# Patient Record
Sex: Male | Born: 1935 | Race: White | Hispanic: No | Marital: Married | Smoking: Former smoker
Health system: Southern US, Community
[De-identification: ages and names within clinical notes are randomized; demographics above are authoritative.]

## PROBLEM LIST (undated history)

## (undated) DIAGNOSIS — I2510 Atherosclerotic heart disease of native coronary artery without angina pectoris: Secondary | ICD-10-CM

## (undated) DIAGNOSIS — R413 Other amnesia: Secondary | ICD-10-CM

## (undated) DIAGNOSIS — E119 Type 2 diabetes mellitus without complications: Secondary | ICD-10-CM

## (undated) DIAGNOSIS — I10 Essential (primary) hypertension: Secondary | ICD-10-CM

## (undated) DIAGNOSIS — E1140 Type 2 diabetes mellitus with diabetic neuropathy, unspecified: Secondary | ICD-10-CM

## (undated) DIAGNOSIS — E785 Hyperlipidemia, unspecified: Secondary | ICD-10-CM

## (undated) DIAGNOSIS — R269 Unspecified abnormalities of gait and mobility: Secondary | ICD-10-CM

## (undated) DIAGNOSIS — E0500 Thyrotoxicosis with diffuse goiter without thyrotoxic crisis or storm: Secondary | ICD-10-CM

## (undated) HISTORY — PX: CORONARY ARTERY BYPASS GRAFT: SHX141

## (undated) HISTORY — PX: ABDOMINAL AORTIC ANEURYSM REPAIR: SUR1152

## (undated) HISTORY — DX: Unspecified abnormalities of gait and mobility: R26.9

## (undated) HISTORY — PX: OTHER SURGICAL HISTORY: SHX169

## (undated) HISTORY — DX: Type 2 diabetes mellitus without complications: E11.9

## (undated) HISTORY — DX: Other amnesia: R41.3

## (undated) HISTORY — DX: Hyperlipidemia, unspecified: E78.5

## (undated) HISTORY — DX: Essential (primary) hypertension: I10

## (undated) HISTORY — DX: Type 2 diabetes mellitus with diabetic neuropathy, unspecified: E11.40

---

## 2000-09-10 ENCOUNTER — Ambulatory Visit (HOSPITAL_COMMUNITY): Admission: RE | Admit: 2000-09-10 | Discharge: 2000-09-10 | Payer: Self-pay | Admitting: *Deleted

## 2000-09-10 ENCOUNTER — Encounter (INDEPENDENT_AMBULATORY_CARE_PROVIDER_SITE_OTHER): Payer: Self-pay | Admitting: *Deleted

## 2003-12-29 ENCOUNTER — Encounter (INDEPENDENT_AMBULATORY_CARE_PROVIDER_SITE_OTHER): Payer: Self-pay | Admitting: Specialist

## 2003-12-29 ENCOUNTER — Ambulatory Visit (HOSPITAL_COMMUNITY): Admission: RE | Admit: 2003-12-29 | Discharge: 2003-12-29 | Payer: Self-pay | Admitting: *Deleted

## 2004-12-13 ENCOUNTER — Inpatient Hospital Stay (HOSPITAL_BASED_OUTPATIENT_CLINIC_OR_DEPARTMENT_OTHER): Admission: RE | Admit: 2004-12-13 | Discharge: 2004-12-13 | Payer: Self-pay | Admitting: Interventional Cardiology

## 2004-12-26 ENCOUNTER — Inpatient Hospital Stay (HOSPITAL_COMMUNITY): Admission: RE | Admit: 2004-12-26 | Discharge: 2005-01-07 | Payer: Self-pay | Admitting: Surgery

## 2005-02-01 ENCOUNTER — Encounter (HOSPITAL_COMMUNITY): Admission: RE | Admit: 2005-02-01 | Discharge: 2005-05-02 | Payer: Self-pay | Admitting: Interventional Cardiology

## 2005-04-26 ENCOUNTER — Observation Stay (HOSPITAL_COMMUNITY): Admission: AD | Admit: 2005-04-26 | Discharge: 2005-05-03 | Payer: Self-pay | Admitting: Interventional Cardiology

## 2005-05-02 ENCOUNTER — Encounter (INDEPENDENT_AMBULATORY_CARE_PROVIDER_SITE_OTHER): Payer: Self-pay | Admitting: Specialist

## 2005-05-27 ENCOUNTER — Ambulatory Visit (HOSPITAL_BASED_OUTPATIENT_CLINIC_OR_DEPARTMENT_OTHER): Admission: RE | Admit: 2005-05-27 | Discharge: 2005-05-27 | Payer: Self-pay | Admitting: Urology

## 2005-05-27 ENCOUNTER — Ambulatory Visit (HOSPITAL_COMMUNITY): Admission: RE | Admit: 2005-05-27 | Discharge: 2005-05-27 | Payer: Self-pay | Admitting: Urology

## 2005-05-29 ENCOUNTER — Encounter (HOSPITAL_COMMUNITY): Admission: RE | Admit: 2005-05-29 | Discharge: 2005-06-08 | Payer: Self-pay | Admitting: Interventional Cardiology

## 2005-10-24 ENCOUNTER — Encounter (HOSPITAL_COMMUNITY): Admission: RE | Admit: 2005-10-24 | Discharge: 2006-01-22 | Payer: Self-pay | Admitting: Endocrinology

## 2006-04-01 ENCOUNTER — Ambulatory Visit (HOSPITAL_COMMUNITY): Admission: RE | Admit: 2006-04-01 | Discharge: 2006-04-01 | Payer: Self-pay | Admitting: Gastroenterology

## 2007-02-19 ENCOUNTER — Ambulatory Visit: Payer: Self-pay | Admitting: *Deleted

## 2007-08-27 ENCOUNTER — Ambulatory Visit: Payer: Self-pay | Admitting: *Deleted

## 2007-09-10 ENCOUNTER — Ambulatory Visit: Payer: Self-pay | Admitting: *Deleted

## 2007-09-10 ENCOUNTER — Encounter: Admission: RE | Admit: 2007-09-10 | Discharge: 2007-09-10 | Payer: Self-pay | Admitting: *Deleted

## 2007-10-23 ENCOUNTER — Ambulatory Visit: Payer: Self-pay | Admitting: *Deleted

## 2007-10-23 ENCOUNTER — Inpatient Hospital Stay (HOSPITAL_COMMUNITY): Admission: AD | Admit: 2007-10-23 | Discharge: 2007-10-24 | Payer: Self-pay | Admitting: Family Medicine

## 2007-11-26 ENCOUNTER — Encounter: Admission: RE | Admit: 2007-11-26 | Discharge: 2007-11-26 | Payer: Self-pay | Admitting: *Deleted

## 2007-11-26 ENCOUNTER — Ambulatory Visit: Payer: Self-pay | Admitting: *Deleted

## 2008-08-04 ENCOUNTER — Ambulatory Visit: Payer: Self-pay | Admitting: *Deleted

## 2009-01-19 ENCOUNTER — Ambulatory Visit: Payer: Self-pay | Admitting: *Deleted

## 2009-01-19 ENCOUNTER — Encounter: Admission: RE | Admit: 2009-01-19 | Discharge: 2009-01-19 | Payer: Self-pay | Admitting: *Deleted

## 2009-07-20 ENCOUNTER — Ambulatory Visit: Payer: Self-pay | Admitting: *Deleted

## 2010-01-22 ENCOUNTER — Encounter: Admission: RE | Admit: 2010-01-22 | Discharge: 2010-01-22 | Payer: Self-pay | Admitting: Family Medicine

## 2010-02-06 ENCOUNTER — Other Ambulatory Visit: Admission: RE | Admit: 2010-02-06 | Discharge: 2010-02-06 | Payer: Self-pay | Admitting: Interventional Radiology

## 2010-02-06 ENCOUNTER — Encounter: Admission: RE | Admit: 2010-02-06 | Discharge: 2010-02-06 | Payer: Self-pay | Admitting: Endocrinology

## 2010-06-07 ENCOUNTER — Encounter: Admission: RE | Admit: 2010-06-07 | Discharge: 2010-06-07 | Payer: Self-pay | Admitting: Gastroenterology

## 2010-08-10 ENCOUNTER — Ambulatory Visit: Payer: Self-pay | Admitting: Vascular Surgery

## 2010-10-01 ENCOUNTER — Encounter: Admission: RE | Admit: 2010-10-01 | Discharge: 2010-10-01 | Payer: Self-pay | Admitting: Nephrology

## 2010-12-30 ENCOUNTER — Encounter: Payer: Self-pay | Admitting: *Deleted

## 2011-01-24 ENCOUNTER — Other Ambulatory Visit: Payer: Self-pay | Admitting: Vascular Surgery

## 2011-01-24 DIAGNOSIS — I714 Abdominal aortic aneurysm, without rupture: Secondary | ICD-10-CM

## 2011-02-20 ENCOUNTER — Ambulatory Visit: Payer: Self-pay | Admitting: Vascular Surgery

## 2011-02-20 ENCOUNTER — Ambulatory Visit
Admission: RE | Admit: 2011-02-20 | Discharge: 2011-02-20 | Disposition: A | Discharge status: Home / Self Care | Payer: Self-pay | Source: Ambulatory Visit | Attending: Vascular Surgery | Admitting: Vascular Surgery

## 2011-02-20 DIAGNOSIS — I714 Abdominal aortic aneurysm, without rupture: Secondary | ICD-10-CM

## 2011-02-20 MED ORDER — IOHEXOL 300 MG/ML  SOLN
80.0000 mL | Freq: Once | INTRAMUSCULAR | Status: AC | PRN
  Administered 2011-02-20: 80 mL via INTRAVENOUS

## 2011-03-06 ENCOUNTER — Ambulatory Visit (INDEPENDENT_AMBULATORY_CARE_PROVIDER_SITE_OTHER): Payer: Medicare Other | Admitting: Vascular Surgery

## 2011-03-06 DIAGNOSIS — I714 Abdominal aortic aneurysm, without rupture: Secondary | ICD-10-CM

## 2011-03-07 NOTE — Assessment & Plan Note (Signed)
OFFICE VISIT  [name], [name] DOB:  1934-01-24                                       03/06/2011 [id number]  This is a pleasant 77 year old gentleman who Dr. Amedeo Plenty performed an endovascular aneurysm repair on with an Endologix graft in November 2008.  He comes in for a routine follow-up visit.  He had previously had a duplex scan here in September which showed a slight decrease in the measurement of the aneurysmal sac with a maximum measurement of 46 cm. Due to the patient's size, it was somewhat of a challenging study so he comes in now for a 82-month CT scan.  Since he was seen in our office last he has had no abdominal or back pain.  He states there has been no change in his medical history.  He has diabetes which has been stable on his current medications.  In addition he has hypertension and hypercholesterolemia both which are stable on his current medications.  SOCIAL HISTORY:  He is married.  He has 3 children.  He quit tobacco in 1991.  REVIEW OF SYSTEMS:  CARDIOVASCULAR:  He had no chest pain, chest pressure, palpitations or arrhythmias. PULMONARY:  He had no productive cough, bronchitis, asthma or wheezing.  PHYSICAL EXAMINATION:  This is a pleasant 77 year old gentleman who appears his stated age.  Blood pressure 116/67, heart rate is 54, respiratory rate is 54, respiratory rate is 16.  Lungs:  Clear bilaterally to auscultation without rales, rhonchi or wheezing. Cardiovascular exam:  I do not detect any carotid bruits.  He has a regular rate and rhythm.  He has palpable femoral pulses.  Both feet are warm and well-perfused without ischemic ulcers.  Abdomen:  Obese and difficult to assess.  He has normal pitched bowel sounds.  Neurologic: He has no focal weakness or paresthesias.  I did review his CT scan.  The radiologist interpreted the maximum diameter at 4.2 cm; however, the maximum diameter that I could obtain on my independent  interpretation is 3.9 cm.  There is no evidence of Endoleak and the graft appears to be excellent position.  The aneurysm continues to shrink gradually.  I think it is safe to continue his follow-up at 1 year and will try to alternate ultrasound with CT scan.  He did have problems with the p.o. contrast with the CT scan so in the future we can try to get this done with simply IV contrast.  I will plan on seeing him back in 1 year and I have ordered a follow-up duplex scan of his graft at that time.  He appears to have had an excellent result with his graft and the aneurysm continues to shrink.    Judeth Cornfield. Scot Dock, M.D. Electronically Signed  CSD/MEDQ  D:  03/06/2011  T:  03/07/2011  Job:  [id number]

## 2011-04-23 NOTE — Assessment & Plan Note (Signed)
OFFICE VISIT   [name], [name]  DOB:  March 07, 1934                                       01/19/2009  [id number]   The patient underwent AAA stent graft placement in November of 2009.  He  returns at this time for routine followup.  He underwent a CT scan prior  to his visit.  This reveals his native aneurysm sac to be stable in size  at 5 cm.  The stent to been in good position, no evidence of endoleak.  Iliac limbs remain patent bilaterally.   The patient has no complaints.  Denies abdominal pain or back pain.  His  appetite is good.  Regular bowel habits.  Weight is stable.   He appears generally well.  Alert, oriented.  No distress.  BP 117/67,  pulse 66, respirations 18.  Abdomen reveals obesity.  Bowel sounds  active, no bruits.  No masses or organomegaly.  Nontender.  Intact  femoral pulses bilaterally.  1+ ankle edema.   Overall the patient is doing well following AAA stent graft placement in  2008.  We will plan followup again in 6 months with an ultrasound of the  abdomen.   Dorothea Glassman, M.D.  Electronically Signed   PGH/MEDQ  D:  01/19/2009  T:  01/20/2009  Job:  [id number]   cc:   Belva Crome, M.D.  Priscille Heidelberg Little, M.D.

## 2011-04-23 NOTE — Assessment & Plan Note (Signed)
OFFICE VISIT   MAMOUN, SPEARMAN  DOB:  [birth year]                                       11/26/2007  [id number]   The patient is status post placement of an endovascular stent graft for  AAA on October 23, 2007.  He returns today with a followup CT scan.  This reveals no complicating features.  No evidence of major endoleak.  Slight enlargement of his aneurysm.   He has no complaints at this time.   BP 103/62, pulse 61, respirations 18.  Abdomen is soft and nontender.  No masses or organomegaly.  2+ femoral pulses bilaterally.  Right groin  incision is healing well.   The patient shows no complicating factors related to his AAA stent  grafting.  Will plan followup again in 6 months with a repeat CT scan.   Dorothea Glassman, M.D.  Electronically Signed   PGH/MEDQ  D:  11/26/2007  T:  11/27/2007  Job:  [id number]   cc:   Lennette Bihari L. Little, M.D.  Belva Crome, M.D.

## 2011-04-23 NOTE — Op Note (Signed)
[name], [name] NO.:  [id number]   MEDICAL RECORD NO.:  [id number]          PATIENT TYPE:  INP   LOCATION:  2550                         FACILITY:  [facility]   PHYSICIAN:  Dorothea Glassman, M.D.    DATE OF BIRTH:  [birth year]   DATE OF PROCEDURE:  10/23/2007  DATE OF DISCHARGE:                               OPERATIVE REPORT   SURGEON:  Dorothea Glassman, MD   ASSISTANT:  1. Leia Alf, MD   ANESTHETIC:  General endotracheal.   ANESTHESIOLOGIST:  Lorrene Reid, MD   PREOPERATIVE DIAGNOSIS:  Enlarging 5.5 cm infrarenal abdominal aortic  aneurysm.   POSTOP DIAGNOSIS:  Enlarging 5.5 cm infrarenal abdominal aortic  aneurysm.   PROCEDURE:  1. Endovascular abdominal aortic aneurysm repair with Endologix      Powerlink stent graft (25 x 16 x 140, 28 x 28 x 75).  2. Retrograde left femoral arteriogram.  3. Left common femoral artery closure with StarClose.   FLUOROSCOPY TIME:  Ten minutes 12 seconds.   CONTRAST:  Visipaque 65 mL.   COMPLICATIONS:  None apparent.   CLINICAL NOTE:  [name] is a 77 year old gentleman with a known  abdominal aortic aneurysm.  This was followed regularly in the office  with serial ultrasound and found to enlarge to 5.5 cm.  CT scan carried  out revealed an adequate infrarenal aortic neck for placement of an  aortic stent graft.  He does have a small accessory right inferior pole  accessory renal artery which will be sacrificed with the procedure.   OPERATIVE PROCEDURE:  The patient was brought to the operating room in  stable condition.  Placed in supine position.  General endotracheal  anesthesia induced.  Foley catheter, central venous catheter and  arterial line placed.  In the supine position the abdomen and both legs  prepped and draped in a sterile fashion.   Oblique skin incision made in the right groin.  Dissection carried down  through subcutaneous tissue with electrocautery.  Deep dissection  carried down to  expose the right common femoral artery.  This was  mobilized up to the inguinal ligament and encircled proximally with a  vessel loop.  Distal dissection carried down to the origin of the  superficial femoral artery and profunda femoris artery and the vessel  encircled with a vessel loop distally.   The patient administered 7000 units of heparin intravenously.  The right  common femoral artery accessed with an 18 gauge needle and a 0.035 J  wire advanced to the needle into the abdominal aorta under fluoroscopy.  A 12 French tearaway sheath was advanced over the guidewire into the  right common femoral artery.  The dilator removed and the sheath flushed  with heparin and saline solution.   Ultrasound of the left groin carried out and the left common femoral  artery identified.  Using ultrasound guidance the left common femoral  artery was accessed percutaneously with an 18 gauge needle.  The left  common femoral artery then accessed with an 0.35 Rosen guidewire  advanced into the abdominal aorta  under fluoroscopy.  The site opened  with 11 blade.  A 9 French sheath advanced over the guidewire in the  left common femoral artery.  The dilator removed and the sheath flushed  with heparin and saline solution.   A snare sheath was then advanced over the right femoral wire and the  wire removed.  A snare passed up into the abdominal aorta from the right  side.  The snare then used to grasp the Rosen wire and brought across  the bifurcation and out the right femoral sheath.  A dual lumen catheter  was then passed across the bifurcation and out the right femoral sheath.  The second lumen opening was positioned at 11 o'clock.  This was  accessed with an Amplatz super stiff guidewire which was advanced up  into the distal descending thoracic aorta.  The bifurcated main body  device delivery catheter was then loaded onto the Amplatz super stiff  guidewire from the right and the contralateral limb  wire was advanced  through the dual lumen catheter across the bifurcation and out the left  side.  The dual lumen catheter was removed.  The bifurcated delivery  catheter was then advanced up to the right common femoral artery.  The  tearaway sheath removed from the right common femoral artery and the  device advanced up into the abdominal aorta.  The contralateral limb  wire was delivered down through the left side.  The limbs of the graft  were then exposed by withdrawing the outer sheath.  This was then  resheathed on the right and the device brought down on the bifurcation.  The main body of the device was then deployed using the pusher rod.  The  contralateral limb was then accessed with a 0.014  guidewire passed  through the SurePass contralateral limb wire.  The contralateral limb  was then deployed in the left common iliac artery.  A marker pigtail  catheter was advanced over the contralateral guidewire to the suprarenal  position.  A minimal flush aortogram was obtained to clearly delineate  the origin of the lowermost renal artery at the left.  There was still a  considerable distance between the proximal portion of the bifurcated  device and the lowermost left renal artery.  Therefore an aortic  extension cuff was placed.  The right femoral sheath was removed and  over the guidewire was passed a 28 x 28 x 75 aortic cuff.  This was  positioned initially suprarenally.  Using contrast injections this was  then deployed down to the immediate infrarenal position below the left  renal artery.  The delivery sheath then removed from the right groin and  a 16 French sheath advanced over the guidewire.  The dilator removed.  A  Coda balloon was then advanced over the main Amplatz guidewire and the  Coda balloon inflated at the junction between the cuff and the aorta and  also at the junction between the cuff and the main body.  Coda balloon  then drawn back down into the right limb.   Guidewire advanced over the  pigtail catheter.  The pigtail catheter removed from the left groin and  a 12 x 4 Powerflex balloon advanced over the guidewire.  Using a kissing  balloon technique the Coda balloon and the 12 x 4 Powerflex were  inflated in the iliac limbs.  The balloon was then removed.  A pigtail  catheter re-advanced over the left femoral guidewire.  A completion  arteriogram  obtained.  This revealed widely patent left main renal  artery and right main renal artery.  The small inferior pole right  accessory renal artery was sacrificed.  There was no evidence of type 1  or type 3 Endoleak.  There was significant type 2 leak identified.  No  porosity leak identified (type 4).  There was excellent flow through the  iliac limbs bilaterally.  No evidence of iliac stenosis.  Hypogastric  arteries were patent bilaterally.   The balloon was removed from the left groin.  A retrograde left femoral  arteriogram obtained with an LAO projection picture.  This verified the  left femoral sheath to be in the common femoral artery.  The sheath was  removed.  A StarClose sheath advanced over the guidewire.  The guidewire  removed.  StarClose device advanced through the sheath.  The device  loaded and brought back to the arterial wall and deployed.  The device  removed and no apparent complications.   On the right side the 16 French sheath and guidewire were removed and  the vessel controlled proximally with a clamp.  Adequate antegrade and  retrograde flushing carried out.  The transverse arteriotomy closed with  running 5-0 Prolene suture.  A small amount of bleeding controlled with  a pledgeted interrupted 5-0 Prolene suture.  Clamps were then removed.  Excellent flow was present.   Adequate hemostasis obtained.  Sponge and instrument counts were  correct.  The patient was administered 50 mg of protamine intravenously.   The right groin was then closed with a deep layer of interrupted  2-0  Vicryl suture, subcutaneous layer of running 3-0 Vicryl sutures.  Skin  closed with 4-0 Monocryl and Dermabond.   There were no apparent complications during the procedure.  The patient  tolerated this well.  Transferred to the recovery room in stable  condition.  Chest x-ray and KUB ordered.      Dorothea Glassman, M.D.  Electronically Signed     PGH/MEDQ  D:  10/23/2007  T:  10/24/2007  Job:  [id number]   cc:   Lennette Bihari L. Little, M.D.  Belva Crome, M.D.

## 2011-04-23 NOTE — Discharge Summary (Signed)
NAMEDRAYSEN, LOWERY NO.:  [id number]   MEDICAL RECORD NO.:  [id number]          PATIENT TYPE:  INP   LOCATION:  [id number]                         FACILITY:  [facility]   PHYSICIAN:  Theotis Burrow IV, MDDATE OF BIRTH:  November 19, 1934   DATE OF ADMISSION:  10/23/2007  DATE OF DISCHARGE:  10/24/2007                               DISCHARGE SUMMARY   PROCEDURE PERFORMED:  Endovascular abdominal aortic aneurysm repair on  October 23, 2007.   DISCHARGE DIAGNOSES:  1. Endovascular abdominal aortic aneurysm.  2. Hypertension.  3. Hypercholesterolemia.  4. Coronary artery disease.   SUMMARY OF HOSPITAL COURSE:  This is a 77- year-old gentleman who came  to the hospital on October 23, 2007, for elective repair of his  abdominal aneurysm.  He underwent uncomplicated successful endovascular  exclusion of the aneurysm using an Endologix device.  Following the  procedure, the patient was taken to 3300.  He had a normal postoperative  course.  On the day following his procedure, he felt well.  He was able  to ambulate with minimal pain and desired to go home.  He will be sent  home with routine postoperative instructions.   Only medication is Percocet.  He will resume his home medications, which  include;  1. Lipitor 10 mg per day.  2. Metoprolol 50 mg per day.  3. Lipitor 20 mg per day.  4. Triamterine/HCTZ 37.5/25 daily.  5. Doxepin 25 daily.  6. Sertraline 50 mg per day.  7. Methimazole 2.5 mg per day.  8. Aspirin 325 mg per day.  9. Niaspan 500 mg per day.  10.Insulin 70/30, 70 units subcutaneous in the morning and insulin      70/30, 37 units subcutaneous in the afternoon.   He will follow up with Dr. Amedeo Plenty in several weeks.  He should call if he  has any problems from his wounds, fevers, chills, nausea, or vomiting.      Eldridge Abrahams, MD  Electronically Signed     VWB/MEDQ  D:  10/24/2007  T:  10/24/2007  Job:  [id number]

## 2011-04-23 NOTE — Procedures (Signed)
DUPLEX ULTRASOUND OF ABDOMINAL AORTA   INDICATION:  Follow up known abdominal aortic aneurysm.   HISTORY:  Diabetes:  Yes.  Cardiac:  Coronary artery disease.  Hypertension:  Yes.  Smoking:  Yes.  Connective Tissue Disorder:  Family History:  Previous Surgery:   DUPLEX EXAM:         AP (cm)                   TRANSVERSE (cm)  Proximal             2.23 cm                   2.6 cm  Mid                  4.7 cm                    5.29 cm  Distal               2.77 cm                   2.6 cm  Right Iliac          Not well visualized       Not well visualized  Left Iliac           Not well visualized       Not well visualized   PREVIOUS:  Date:  AP:  4.6  TRANSVERSE:  4.5   IMPRESSION:  1. Abdominal aortic aneurysm noted with the largest measurement of      (4.7 cm x 5.29 cm).  2. Slightly increase in measurement noted, compared to previous study.   ___________________________________________  P. Drucie Opitz, M.D.   MG/MEDQ  D:  08/27/2007  T:  08/27/2007  Job:  [id number]

## 2011-04-23 NOTE — Assessment & Plan Note (Signed)
OFFICE VISIT   [name], [name]  DOB:  1934-11-23                                       08/04/2008  [id number]   The patient underwent placement of a AAA stent graft 10/22/2008 at [facility].  He had no associated complications.   He is seen today for routine followup visit.  He underwent an ultrasound  at this time due to a mildly elevated creatinine.  His ultrasound  reveals his native aneurysmal sac to be 5 cm in maximal diameter.  His  stent graft is patent without evidence of endoleak.  Ankle brachial  indices greater than 1 bilaterally.   The patient appears well.  He has no complaints at this time.  Alert and  oriented.  Blood pressure is 114/69, pulse 59 per minute.  Abdomen soft,  nontender.  Intact femoral pulses bilaterally.   Overall the patient is doing well without complicating features  associated with his AAA stent.  We will plan followup again in 6 months  and with possible CT scan if creatinine is adequate at that time.   Dorothea Glassman, M.D.  Electronically Signed   PGH/MEDQ  D:  08/04/2008  T:  08/05/2008  Job:  [id number]   cc:   Belva Crome, M.D.  Priscille Heidelberg Little, M.D.

## 2011-04-23 NOTE — Assessment & Plan Note (Signed)
OFFICE VISIT   [name], [name]  DOB:  January 17, 1934                                       08/27/2007  [id number]   Patient returned for routine surveillance ultrasound of his known AAA.  This reveals fairly significant enlargement, now measuring 5.2 cm in  maximal diameter.  Previous measurement was 4.6 cm in maximal diameter.  This is a significant enlargement over six months.   Patient has been free of any symptoms.  He denies abdominal pain or back  pain.  No chest pain or shortness of breath.   His blood pressure is 112/67.  Pulse is 60 per minute.  He has an obese  abdomen.  No palpable AAA.  No tenderness or organomegaly.  He has 2+  femoral pulses bilaterally.  No ankle edema.   I have recommended we go ahead with a CT angiogram of the abdomen with  runoff.  Noting significant enlargement of his aneurysm, I think we  should pursue potential repair.   Dorothea Glassman, M.D.  Electronically Signed   PGH/MEDQ  D:  08/27/2007  T:  08/28/2007  Job:  [id number]   cc:   Lennette Bihari L. Little, M.D.  Belva Crome, M.D.

## 2011-04-23 NOTE — Procedures (Signed)
ENDOVASCULAR STENT GRAFT EXAM   INDICATION:  Follow up abdominal aortic aneurysm repair.   HISTORY:  Abdominal aortic aneurysm Endograft repair, 10/23/07, by Dr. Amedeo Plenty.                           DUPLEX EVALUATION   AAA Sac Size:                 4.52 CM AP            5.07 CM TRV  Previous Sac Size:            11/26/07 (CT) 5.2 CM AP  11/26/07 (CT) 5.0 CM TRV  Evidence of an endoleak?      No                    No   Velocity Criteria:  Proximal Aorta                118 cm/sec  Proximal Stent Graft          98 cm/sec  Main Body Stent Graft-Mid     83 cm/sec  Right Limb-Proximal           78 cm/sec  Right Limb-Distal             88 cm/sec  Left Limb-Proximal            103 cm/sec  Left Limb-Distal              100 cm/sec  Patent Renal Arteries?        Yes                   Yes   IMPRESSION:  1. Patent abdominal aortic aneurysm Endograft without evidence of      endoleak.  2. Note:  A technically limited study due to positioning, body      habitus, and vessel depth.  Was only able to visualize in the      lateral decubitus positions.   ___________________________________________  P. Drucie Opitz, M.D.   AS/MEDQ  D:  08/04/2008  T:  08/04/2008  Job:  [id number]

## 2011-04-23 NOTE — Procedures (Signed)
VASCULAR LAB EXAM   INDICATION:  Follow up AAA endograft placed, 10/23/2007.   HISTORY:  Diabetes:  Yes.  Cardiac:  Yes.  Hypertension:  Yes.   EXAM:  AAA sac size 3.88 cm AP, 4.59 cm TRV.   Previous sac size on 07/20/2009 4.89 cm AP, 4.96 cm TRV.   IMPRESSION:  1. The aorta and endograft appear patent where visualized.  2. Slight decrease in measurement of the aneurysmal sac surrounding      the endograft; however, limited visualization.  3. No evidence of endoleak was detected; however, limited      visualization.  4. Technically limited study due to positioning, body habitus, and      vessel depth.  5. Only able to visualize in lateral decubitus position.   ___________________________________________  Judeth Cornfield. Scot Dock, M.D.   EM/MEDQ  D:  08/10/2010  T:  08/10/2010  Job:  [id number]

## 2011-04-23 NOTE — Assessment & Plan Note (Signed)
OFFICE VISIT   [name], [name]  DOB:  1934-05-27                                       09/10/2007  [id number]   The patient returned to the office today with the results of his CT  scan.  This does verify a 5.2 cm abdominal aortic aneurysm.  This has  grown significantly from previous measurement of 4.6.  He does have a  long infrarenal aortic neck and adequate iliac arteries for placement of  an aortic stent graft.   The patient will require a preoperative Cardiolite evaluation prior to  AAA stent placement.  We will arrange this with Dr. Thompson Caul office.  Once this has been completed, his procedure will be scheduled.   Dorothea Glassman, M.D.  Electronically Signed   PGH/MEDQ  D:  09/10/2007  T:  09/11/2007  Job:  [id number]   cc:   Lennette Bihari L. Little, M.D.  Belva Crome, M.D.

## 2011-04-23 NOTE — Procedures (Signed)
ENDOVASCULAR STENT GRAFT EXAM   INDICATION:  Follow-up evaluation of abdominal aortic aneurysm repair.   HISTORY:  Abdominal aortic Endograft repair on 10/23/2007 by Dr. Amedeo Plenty.                           DUPLEX EVALUATION   AAA Sac Size:                 4.89 CM AP            4.96 CM TRV  Previous Sac Size:            4.528 CM AP           5.07 CM TRV  Evidence of an endoleak?      No                    No   Velocity Criteria:  Proximal Aorta                121 cm/sec  Proximal Stent Graft          98 cm/sec  Main Body Stent Graft-Mid     128 cm/sec  Right Limb-Proximal           137 cm/sec  Right Limb-Distal             123 cm/sec  Left Limb-Proximal            110 cm/sec  Left Limb-Distal              97 cm/sec  Patent Renal Arteries?        Yes                   Yes   IMPRESSION:  1. Patent abdominal aortic Endograft with no evidence of endoleak or      stenosis.  2. Normal bilateral lower extremity ABIs with biphasic and triphasic      Doppler waveforms.   ___________________________________________  P. Drucie Opitz, M.D.   AC/MEDQ  D:  07/20/2009  T:  07/20/2009  Job:  [id number]

## 2011-04-26 NOTE — Discharge Summary (Signed)
[name], [name] NO.:  [id number]   MEDICAL RECORD NO.:  [id number]          PATIENT TYPE:  INP   LOCATION:  2024                         FACILITY:  [facility]   PHYSICIAN:  Belva Crome, M.D.   DATE OF BIRTH:  May 12, 1934   DATE OF ADMISSION:  04/26/2005  DATE OF DISCHARGE:  05/03/2005                                 DISCHARGE SUMMARY   ADMISSION DIAGNOSES:  1.  Anemia secondary to supratherapeutic INR.  2.  Hypertension.  3.  Coronary artery disease.  4.  Hematuria.  5.  Paroxysmal atrial fibrillation.   DISCHARGE DIAGNOSES:  1.  Anemia secondary to supratherapeutic INR, resolved.  2.  Gross hematuria, improved.  3.  Mild renal insufficiency, improved.  4.  Hypokalemia, resolved.  5.  Coronary artery disease, stable.  6.  Hypertension, controlled.  7.  Paroxysmal atrial fibrillation, maintained in sinus rhythm.   PROCEDURE:  None.   DISCHARGE STATUS:  Stable.   HISTORY OF PRESENT ILLNESS:  Please see complete H&P for details, but in  short this is a 77 year old male with known CAD, status post CABG from 2004.  Also with history of PAF and is on chronic anticoagulation.  He apparently  miss dosed himself for three days taking a higher dose thinking he was  taking lower dose tablets. On admission his INR was a little over 6 and his  hemoglobin was down to 8.7. He had complaints of hematuria. He was admitted  for further treatment.   PHYSICAL EXAMINATION ON ADMISSION:  Please see complete H&P, but in short  his vital signs were stable. Physical exam essentially without abnormalities  except for some mild lower extremity ankle edema. EKG was maintained in  normal sinus rhythm with normal QT interval and as stated hemoglobin 8.7 on  admission with INR of 6.28. The rest of his labs reveal some azotemia with a  BUN and creatinine of 38 and 2.2.   HOSPITAL COURSE:  She was admitted to telemetry. Coumadin was on hold. He  was given IV vitamin K for INR  reversal. Outpatient medications were  continued.  Will monitor CBC.   The following day, on Apr 27, 2005, INR down to 3.3. Hemoglobin down to 8.  He was given two units of packed red blood cells. Coumadin and aspirin  continued to be on hold. Maxzide was also held at this point with his blood  pressure being lower at 109/60.   On Apr 28, 2005, he was complaining of left lower quadrant pain. Blood  pressure had improved and increasing back up to [id number] to 0000000 systolically.  He maintained a sinus rhythm. No new changes on physical exam. Hemoglobin  went up to 10.5.  INR down to 2.2. BUN and creatinine 39 and 2.6.  Urinalysis was sent. Urology was consulted for worsening renal function and  persistent hematuria, now with left lower quadrant pain. Diuretics were on  hold. Coumadin continued to be held.   On Apr 28, 2005, Dr. Karsten Ro saw the patient in consult. He agreed with  urine for CNS holding Coumadin.  He started him on empiric Levaquin. He  wanted to check a renal ultrasound and a PSA. PSA was checked and was within  normal limits. Renal ultrasound was performed on Apr 29, 2005, which showed  no evidence of obstruction or specific abnormality.  Urine culture was  negative. Cystogram was planned by Dr. Karsten Ro. The rest of this  hospitalization was essentially uneventful. INR had normalized and actually  became subtherapeutic by Apr 30, 2005, down to 1.8.  BUN and creatinine that  same day 42 and 2.4. Hemoglobin was down a little more to 9.5.  No other  significant changes were noted.  Potassium did drop to 3.2 on May 01, 2005.  Replacement was ensued.   On May 02, 2005, the patient underwent cystogram with Dr. Karsten Ro. He  tolerated well and there were no complications. Specimen was sent for  cytology of the right renal pelvis. By May 03, 2005, the patient was ready  for discharge. BUN and creatinine down to 21 and 2.0 respectively.  Hemoglobin remaining stable at 9.6. INR  subtherapeutic at 1.3. His Coumadin  would be discontinued at this point. Further treatment and evaluation per  Dr. Karsten Ro pending cytoscopy pathology report. He was maintaining a sinus  rhythm and had no other problems or events during hospitalization. He was  discharged home without incident.   DISCHARGE MEDICATIONS:  1.  Aspirin 325 mg daily.  2.  Niacin 2000 mg daily.  3.  Lipitor 20 mg daily.  4.  Toprol XL 50 mg daily.  5.  Doxepin 25 mg at h.s.  6.  Amaryl 2 mg daily.  7.  Avandia 4 mg b.i.d.  8.  Claritin p.r.n.  9.  Iron 325 mg b.i.d.   He was instructed not to take his Coumadin, triamterene, HCTZ, or his  amiodarone.   ACTIVITY:  As tolerated.   DIET:  He is to maintain low-salt, low-fat, low-cholesterol diet as well as  maintain his diabetic diet restrictions.   He is to have bloodwork at Dr. Thompson Caul office on Tuesday, May 07, 2005. He  is to call Dr. Simone Curia office for follow-up appointment one week after  discharge. He has an appointment to see Dr. Tamala Julian for follow-up on Monday,  May 20, 2005, at 3 p.m.      Verlon Au, N.P.      Belva Crome, M.D.  Electronically Signed    HB/MEDQ  D:  07/21/2005  T:  07/21/2005  Job:  [id number]   cc:   Thana Farr. Karsten Ro, M.D.  [place]. [address] [place]  [place] [address]  Fax: [phone]

## 2011-04-26 NOTE — Consult Note (Signed)
NAMEJADDEN, LAHAYE NO.:  [id number]   MEDICAL RECORD NO.:  [id number]          PATIENT TYPE:  INP   LOCATION:  2024                         FACILITY:  [facility]   PHYSICIAN:  Mark C. Karsten Ro, M.D.  DATE OF BIRTH:  [birth year]   DATE OF CONSULTATION:  04/28/2005  DATE OF DISCHARGE:                                   CONSULTATION   Mr. Shirkey is a very pleasant 77 year old white male seen in hospital  consultation for further evaluation of gross hematuria.  He was admitted two  days ago with anemia and hematuria due to overanticoagulation.  He has PAF  and has been on Coumadin, he took too much of the medication by mistake.  He  had an INR of 14.5 and began having hematuria.  His hemoglobin was checked,  it was 9.4.  He was given Vitamin K and his hemoglobin further decreased.  He was, therefore, transfused and I was contacted regarding further  evaluation.  In addition to his hematuria, there was reported left lower  quadrant pain last night and he has also had an elevated creatinine this  hospitalization.   In talking with the patient, his gross hematuria started on Thursday.  He  has had no change in his voiding pattern and denies any dysuria or new  obstructive or irritative voiding symptoms.  As a baseline, he has nocturia  1-2 times but has a good stream.  Last night he developed left lower  quadrant pain, he described it as constant with no radiation into the  testicles or flank, and of moderate severity.  It was not associated with  any nausea, vomiting.  The pain has now subsided and completely resolved.   He has no past history of stones, UTI or hematuria.  Dr. Hulan Fess  follows his PSA and he reports this has been normal previously.  He does  have a past history of an elevated creatinine his last admission for his  bypass surgery.  His creatinine was 1.6 on the day of that admission, when  he was admitted for his surgery it got as low as 1.3 but was as  high as 2.1  on the 22nd of January.  When he was discharged from the hospital on the  29th of January, it was 1.9.  His creatinine upon this admission was 2.2, 48  hours later it has increased to 2.6.  He has no history of renal disease  prior.   PAST MEDICAL HISTORY:  Is positive for:  1.  PAT.  2.  Coronary artery disease.  3.  Adult onset diabetes.  4.  Hypertension.  5.  Obesity.   SURGICAL HISTORY:  He has had coronary artery bypass grafting.   ALLERGIES:  NKDA.   MEDICATIONS ON ADMISSION:  Amaryl, Avandia, metoprolol, aspirin, amiodarone,  Lipitor, Niaspan, Doxepin, triamterene/HCTZ, and Coumadin.   FAMILY HISTORY:  Is negative for GU malignancy, renal disease or stones.  Mother died in her 24s of cardiovascular disease and an MI, father in his  43s and he had an MI, COPD and was  a smoker.   SOCIAL HISTORY:  He used to smoke but quit in the 80s.  Denies ethanol use.   REVIEW OF SYSTEMS:  Is negative for shortness of breath.  He has no flank  pain.  He has some swelling in both lower extremities since his last  surgery.  His full review of systems is otherwise negative at this time.   PHYSICAL EXAM:  Temperature 97.2, pulse 69, blood pressure is 124/58,  respirations 20 and unlabored.  Generally, the patient is a well-developed,  well-nourished, moderately obese white male in no apparent distress.  HEENT:  Atraumatic, normocephalic.  Oropharynx is clear.  NECK:  Supple with midline trachea.  CHEST:  Reveals normal respiratory effort.  CARDIOVASCULAR:  Irregularly irregular rhythm, regular rate.  ABDOMEN:  Obese, soft and nontender.  He has no peritoneal signs or  abdominal mass palpable.  There is no CVAT.  GU:  Reveals normal phallus.  He has no inguinal hernias or adenopathy or  tenderness in the inguinal regions.  His scrotum is normal, testicles are  descending bilaterally.  Equal in size and consistency.  The epididymis are  normal as well.  He has normal anus and  perineum, he has normal rectal tone.  His prostate is smooth and symmetric, it is 1+ enlarged, has no suspicious  nodularity or induration, no seminal vesicle abnormality noted.  EXTREMITIES:  Reveal no clubbing, cyanosis and there is some edema in the  lower extremity.  No deformity is noted.  SKIN:  Is warm and dry.  There is a median sternotomy scar noted.  NEUROLOGICALLY:  He is alert and oriented with appropriate mood and affect.   LABORATORY RESULTS:  Creatinine 2.6.  Urinalysis was nitrite positive as  well as having moderate leukocyte esterases.  There were a few bacteria  seen, with 7-10 white cells and too numerous to count red cells on clean  catch specimen.   IMPRESSION:  1.  Gross painless hematuria in a patient overlying anticoagulated.  Causes      would include, amongst others, urinary tract infection, stone,      urothelial malignancy, such as bladder cancer, because of his smoking      history, or renal carcinoma.  He needs to have his kidneys imaged to      rule out mass or obstruction.  He will eventually need cystoscopy and      bilateral retrograde pyelograms.  I will rule out a UTI and begin      empiric antibiotics.  2.  Elevated creatinine.  He appears to have either limited renal reserve,      as evidenced by his last admission, with a bump in his creatinine during      that hospitalization verses chronic renal insufficiency.  He will need      to have the obstruction ruled out by ultrasound, but if no      hydronephrosis is noted and creatinine continues to elevate, I would      recommend nephrology consultation.  3.  He has benign prostatic hypertrophy by exam but no obstructive symptoms.      With the hematuria, I will obtain a PSA today.  4.  Left lower quadrant pain, undetermined etiology.  It was present last      evening, it was not colicky in nature but it is possible it could have      been clot colic; however, it is resolved today.  PLAN:  1.   Urine for  culture and sensitivity.  2.  Continue to hold Coumadin.  3.  Obtain renal ultrasound.  4.  Begin empiric Levaquin 500 mg a day.  5.  Obtain PSA.  6.  Will perform cystoscopy and retrograde pyelogram once PT normalizes.      MCO/MEDQ  D:  04/28/2005  T:  04/28/2005  Job:  [id number]

## 2011-04-26 NOTE — Op Note (Signed)
[place]  Patient:    [name], [name]                         MRN: [id number] Proc. Date: 09/10/00 Adm. Date:  [id number] Attending:  Ardeth Perfect CC:         Priscille Heidelberg Little, M.D.   Operative Report  PROCEDURES:  Video colonoscopy.  INDICATIONS:  A 77 year old male with guaiac positive stool.  PREPARATION:  He is n.p.o. since midnight, having taken Phospho-Soda prep and a clear liquid diet.  The mucosa was clean.  Depth of insertion: the scope was inserted to the level of the ileocecal valve.  The cecum could be seen from a distance, but not entered.  PREPROCEDURE SEDATION:  He received 80 mg of Demerol and 8 mg Versed intravenously.  Patient was on 2 L of nasal cannula O2.  PROCEDURE:  The Olympus video colonoscope was inserted via the rectum and advanced through a very long, tortuous colon to the ileocecal valve area. Despite extraabdominal pressure, rotating the patient onto his back and onto his right side, the head of the cecum could not be entered, but was seen from a distance and no pathology was appreciated in that site.  On withdrawal, the mucosa was carefully evaluated.  There was a diminutive hepatic flexure polyp that was removed with electrosnare cautery.  Similarly, there were two rectosigmoid polyps removed.  In the rectum, was a 2 cm pedunculated polyp that was snared and recovered for histologic evaluation.  The patient was noted to have moderate diverticulosis throughout the colon as well.  He tolerated the procedure well.  Pulse, blood pressure and oximetry testing were stable throughout.  He will be observed in recovery for 1 hour and discharged, if alert with a benign abdomen.  IMPRESSION: 1. Multiple diminutive polyps in the hepatic flexure and rectosigmoid, which    have been removed. 2. Large adenoma of the rectum, which has been removed. 3. Diffuse diverticulosis.  PLAN:  Patient will receive a letter  advising him of the polyp histology and likely reminding him to have a repeat colonoscopy within three years, since I suspect the rectal polyp is an adenoma.  He may return to the office p.r.n., otherwise follow up with Dr. Rex Kras. DD:  09/10/00 TD:  09/10/00 Job: [id number]

## 2011-04-26 NOTE — Discharge Summary (Signed)
NAMEDAY, GELVIN NO.:  [id number]   MEDICAL RECORD NO.:  [id number]          PATIENT TYPE:  INP   LOCATION:  2010                         FACILITY:  [facility]   PHYSICIAN:  Gilford Raid, M.D.     DATE OF BIRTH:  1934-01-21   DATE OF ADMISSION:  12/26/2004  DATE OF DISCHARGE:  01/03/2005                                 DISCHARGE SUMMARY   ADMISSION DIAGNOSIS:  Three-vessel coronary artery disease.   PAST MEDICAL HISTORY:  1.  Diabetes mellitus type 2.  2.  Hypertension.  3.  Dyslipidemia.   ALLERGIES:  This patient has no known drug allergies.   DISCHARGE DIAGNOSES:  Three-vessel coronary artery disease, status post  coronary artery bypass grafting.   HISTORY:  Mr. Bergara is a 77 year old Caucasian man. His primary care  Clovis Mankins recommended a Cardiolite stress test because of his cardiac risk  factors. This was performed on November 2005 and revealed stress-induced  ischemia in the anterior basal region as well as decreased uptake in the  distal inferior wall and inferior apical regions. His ejection fraction was  estimated to be 65%. He subsequently underwent cardiac catheterization  December 13, 2004 by Dr. Daneen Schick. This revealed significant three-vessel  coronary artery disease. At the same time, he also underwent abdominal  aortography which revealed an infrarenal abdominal aortic aneurysm thought  to be greater than 4.0 cm. Mr. Zenk was referred to CVTS and was evaluated  by Dr. Cyndia Bent for consideration of coronary artery bypass grafting. After  examination of the patient and review of all of the available records, Dr.  Cyndia Bent agreed that proceeding with coronary artery bypass grafting was the  preferred treatment choice from this gentleman. The procedure risk and  benefits were all discussed with Mr. Fertitta, and he agreed to proceed with  surgery. Because of the finding of abdominal aortic aneurysm, Dr. Cyndia Bent  recommended Duplex scan of his  abdomen. This was performed on January 17 and  revealed an abdominal aortic aneurysm with a maximum diameter of 3.9 cm.  This aneurysm does not need attention at this time but should be followed  with serial abdominal ultrasounds.   HOSPITAL COURSE:  On December 26, 2004, Mr. Messerli was electively admitted to  [facility] under the care of Dr. Cyndia Bent. He underwent the following  surgical procedure:  Coronary artery bypass grafting x6. Grafts placed at  the time of procedure:  Left internal mammary artery graft to the left  anterior descending artery, saphenous venous graft in a sequential fashion  to the posterior descending and posterolateral arteries, saphenous venous  graft in a sequential fashion to the second diagonal and intermediate  arteries, saphenous venous graft to the third diagonal artery. Vein was  harvested from the right lower extremity with an endo vein harvesting  technique. He tolerating this procedure well, transferring in stable  condition to the SICU. He remained hemodynamically stable in the immediate  postoperative period and was extubated several hours after arrival into the  intensive care unit. He awoke from anesthesia neurologically intact. He  required  only routine care in the intensive care unit and was ready for  transfer to unit 2,000 on postoperative day #1. Mr. Guida diabetes was  managed with a Glucomander protocol in the immediate perioperative period.  His oral agents with the exception of Glucophage were restarted  postoperative day #1. Plan was for his Glucophage to restart before  discharge; however, Mr. Sivers did have some elevated creatinine, and so his  Glucophage has not been restarted. Also, because of his elevated creatinine,  his ACE inhibitor was not restarted prior to discharge. Mr. Welburn  creatinine did elevated to a maximum of 2.1 on postoperative day #4. His  baseline creatinine was 1.6. He was treated conservatively, and his   creatinine has been trending back down to his baseline. Today, January 25,  creatinine was 1.9. Mr. Michniewicz has also had significant volume overload, as  his creatinine was increasing, his Lasix was held, but as it began to  stabilize, Lasix was restarted on January 23, and he is now responding well  to this diuretic. He is now below his baseline weight, and his significant  lower extremity edema is improving. Mr. Rummer also had a brief episode of  atrial fibrillation in the postoperative period. This was treated with an  increased dose of beta blocker. He has maintained normal sinus rhythm  throughout the remainder of his stay.   Overall, Mr. Arduini is making very good progress in recovering from his  surgery. January 03, 2005, postoperative day #7, on morning rounds, Mr.  November reports feeling very well. His vital signs are stable. Blood pressure  was 131/61. He is afebrile. His room air saturation is 95%. His heart is  maintaining normal sinus rhythm, 91 beats per minute. His lungs have distant  but clear sounds today. He is tolerating his diet, eating small amounts. His  bowel and bladder function is within normal limits for him. His incisions  are all healing well. He still has 1+ edema in his ankles and feet. This is  much improved from 48 hours ago. He is ambulating in the halls  independently. His pain is well controlled. If Mr. Homeyer continues to  progress in this manner and his creatinine remains stable, he will be ready  for discharge home tomorrow, January 03, 2005.   Recent laboratory studies. January 25 chemistry:  Sodium 141, potassium 4.1,  BUN 22, creatinine 1.9, glucose 102. January 20 CBC:  White blood cells 9.9,  hemoglobin 11.7, hematocrit 34.4, platelets 155. CBGs the last 24 hours:  114/132/126/120.   CONDITION ON DISCHARGE:  Improved.   INSTRUCTIONS ON DISCHARGE:  MEDICATIONS:  1.  Enteric-coated aspirin 325 mg q.d.  2.  Toprol-XL 50 mg q.d. 3.  Lipitor 20 mg 20  mg q.d.  4.  Doxepin 25 mg q.d.  5.  Niacin 2,000 mg p.o. q.h.s.  6.  Amaryl 2 mg p.o. q.d.  7.  Avandia 4 mg q.d.  8.  Lasix 80 mg q.d. for the next week, then 40 mg daily.  9.  Potassium chloride 40 mEq q.d. for 1 week, then 20 mEq daily.   Mr. Spadaro has been reminded to not take the following medications until seen  by Dr. Tamala Julian:  Lisinopril, Glucophage, triamterene, hydrochlorothiazide.   For pain management, he may have Tylox 1 to 2 p.o. q.4h. for moderate to  severe pain or Tylenol 325 mg 1 to 2 p.o. q.4h. for mild pain.   ACTIVITY:  We asked him to refrain from any  driving or any heavy lifting,  pushing, or pulling. We also instructed him to continue his breathing  exercises and daily walking.   DIET:  Should continue to be a carbohydrate modified medium calorie diet.   WOUND CARE:  He is shower daily with mild soap and water. If his incisions  show any sign of infection, he is to call Dr. Vivi Martens office.   FOLLOW UP:  1.  Dr. Tamala Julian would like to see him back in his office in approximately two      weeks. The patient has been asked to call to arrange that appointment      and given the office phone number. He will have a chest x-ray taken on      that      date.  2.  Dr. Cyndia Bent would like to see Mr. Carnaghi back in the CVTS office on      Tuesday, February 7, at 11:30 in the morning. He will be asked to bring      his chest x-ray from Dr. Thompson Caul office for Dr. Cyndia Bent to review.      CTK/MEDQ  D:  01/02/2005  T:  01/02/2005  Job:  [id number]

## 2011-04-26 NOTE — Discharge Summary (Signed)
[name], [name] NO.:  [id number]   MEDICAL RECORD NO.:  [id number]          PATIENT TYPE:  INP   LOCATION:  2010                         FACILITY:  [facility]   PHYSICIAN:  Gilford Raid, M.D.     DATE OF BIRTH:  [birth year]   DATE OF ADMISSION:  12/26/2004  DATE OF DISCHARGE:  01/06/2005                                 DISCHARGE SUMMARY   ADDENDUM   [name] discharge plan for January 26, was held due to a few episodes of  rapid atrial fibrillation.  He initially was treated with an increase in his  beta-blocker and extra potassium.  He did not have any further atrial  fibrillation the evening of January 26, and on morning rounds, January 27,  he remained in sinus rhythm.  Again, discharge was planned for January 27.  However, before he could be discharged home, he had again an episode of  rapid atrial fibrillation.  Because of the recurrence of this arrhythmia,  both Dr. Cyndia Bent and Dr. Tamala Julian agreed to defer discharge and start amiodarone  and Coumadin.  In anticipation of his amiodarone therapy, TSH level was  drawn as well as PFTs.  After the afternoon of January 27, he has not had  any further atrial fibrillation.  He is has tolerated loading of his  amiodarone as well as Coumadin.  His INR is rising appropriately.   On January 29, postop day #11 on morning rounds, [name] reports feeling  very well.  His vital signs are stable with blood pressure 127/75.  His room  air saturation is 95%.  He remains afebrile.  His heart has maintained a  normal sinus rhythm.  His wounds are continuing to heal well.  If [name] heart rhythm remains stable and his INR continues to rise  appropriately, he will be discharged home tomorrow, January 06, 2005.   LABORATORY DATA AND X-RAY FINDINGS:  On January 29, PT 15.6, INR 1.4.  Chemistries on January 29, with sodium 136, potassium 4.0, BUN 21,  creatinine 1.9, glucose 104.   DISCHARGE MEDICATIONS:  1.  Enteric  coated aspirin 325 mg q.d.  2.  Toprol XL 50 mg q.d.  3.  Lipitor 20 mg q.d.  4.  Amiodarone 200 mg b.i.d.  5.  Coumadin, dose to be determined at discharge, probably 5 mg daily.  6.  Lasix 80 mg x1 week, then 40 mg daily.  7.  Potassium chloride 20 mEq daily x1 week, then 20 mEq q.d.  8.  Doxepin.  9.  Niacin 2000 mg q.h.s.  10. Amaryl 2 mg q.d.  11. Avandia 4 mg q.d.  12. Tylox q.4h. p.r.n. pain.   SPECIAL INSTRUCTIONS:  Refrain from taking the following medications until  seen by Dr. Tamala Julian:  Lisinopril, Glucophage, triamterene and HCTZ.  The  remainder of his discharge instructions are the same as prior dictation.  The only other addition is PT/INR blood work at [facility] on Wednesday, January 09, 2005.  He has been asked to call and  arrange this appointment.  CTK/MEDQ  D:  01/06/2005  T:  01/06/2005  Job:  [id number]   cc:   Belva Crome III, M.D.  [place]. [employer]  [address] [id number]  Fax: [place]. Little, M.D.  [address]  [place]  [place] [address]  Fax: [phone]

## 2011-04-26 NOTE — Cardiovascular Report (Signed)
NAMESAVIER, KADLEC NO.:  [id number]   MEDICAL RECORD NO.:  [id number]          PATIENT TYPE:  OIB   LOCATION:  6501                         FACILITY:  [facility]   PHYSICIAN:  Belva Crome III, M.D.DATE OF BIRTH:  03-May-1934   DATE OF PROCEDURE:  12/13/2004  DATE OF DISCHARGE:                              CARDIAC CATHETERIZATION   INDICATIONS:  Abnormal Cardiolite demonstrating inferior apical ischemia.   PROCEDURE PERFORMED:  1.  Left heart catheterization.  2.  Selective coronary in June  3.  Left ventriculography.  4.  Abdominal aortography after wire position suggested the possibility of      abdominal aortic aneurysm.   DESCRIPTION:  After informed consent, a 4-French sheath was placed in the  right femoral artery using the modified Seldinger technique. A 4-French A2  multipurpose catheter was then used for hemodynamic recordings, left  ventriculography by hand injection, and right coronary angiography.  A #4 left Judkins catheter was used for left coronary angiography. Abdominal  aortography was used with power injection using the multipurpose catheter at  15 mL  per second for a total of 25 mL  after wire motion  suggested the  possibility of aortic aneurysm. The patient tolerated procedure without  complications.   RESULTS:  1.  HEMODYNAMIC DATA:      1.  Aortic pressure 118/64.      2.  Left ventricle pressure 118/15.  2.  Left ventriculography: The left ventricle is normal in size. Overall      contractility is normal. Ejection fraction is 65% .  3.  Coronary angiography:      1.  Left main coronary:  The left main coronary artery is free of any          significant obstruction. Some calcification is noted.      2.  Left anterior descending coronary:  The LAD system is relatively          small. Two moderate to large size diagonal branches arise from the          LAD and the LAD then terminates at the apex but does not wrap the          apex.  The LAD before the large second diagonal and before the          dominant septal perforator contains an eccentric 80% stenosis. The          first diagonal was relatively small, the second diagonal was large          and bifurcates free of any significant obstruction. The third          diagonal is large, trifurcates and contains ostial segmental 75-80%          narrowing. The LAD beyond the lesion proximal to the septal          perforator contains a significant angle/bend any is graftable in the          mid segment and supplies a moderate to large distribution.      3.  Circumflex artery:  Circumflex coronary artery is relatively small.          There is segmental 60-75% stenosis noted in the mid          circumflex/obtuse marginal. Only one significant obtuse marginal          arises from the circumflex territory.      4.  Right coronary:  The right coronary artery is a dominant vessel that          gives origin to a large PDA.  Two large left ventricular branches.          There is irregularity with up to 40% narrowing at the mid vessel.          Distally, there are a eccentric tandem lesions with the more          proximal lesion obstructing the vessel by 50-60%  and the more          distal lesion of obstructing the vessel by 75-80%. This second          lesion is right at the bifurcation into the PDA and continuation of          the RCA.  4.  Abdominal aortography:  Abdominal aortography was performed after wire      movement during the cardiac catheterization suggested the presence of      probable aneurysm. A hand flush in confirmed swirling contrast in the      infrarenal abdominal aorta and a power injection was performed that      demonstrated widely patent renal arteries and a fusiform abdominal      aortic aneurysm that is noted by swirling contrast within the aneurysm.      The anticipated size of this aneurysm appears to be greater than 4 cm in      diameter, probably more  in the 4.5 cm range, although this has not been      obviously qualitative.   CONCLUSION:  1.  Significant coronary atherosclerosis with high-grade obstruction in the      mid left anterior descending  proximal to the dominant first septal      perforator and two large diagonal branches. The circumflex contains  70%      segmental stenosis in the only obtuse marginal and the right coronary      contains tandem significant stenoses in the distal vessel before the      large posterior descending artery and two large left ventricular      branches.  2.  Normal left ventricular function.  3.  Fusiform abdominal aortic aneurysm, which is clinically significant.   PLAN:  CTS evaluation by Dr. Tharon Aquas Trigt. I will depend upon him to have  abdominal aortic aneurysm also evaluated by one of his colleagues. Will  place the patient on aspirin per day,  give some nitroglycerin. We adjust  his medications with his cardiovascular anatomy in mind and from there moved  towards a treatment scheme based upon the input from surgeons.       HWS/MEDQ  D:  12/13/2004  T:  12/13/2004  Job:  [id number]   cc:   Lennette Bihari L. Little, M.D.  [address]  [place]  [place] [address]  Fax: [phone]   Len Childs, M.D.  [address]  [place]  [place] [address]

## 2011-04-26 NOTE — Op Note (Signed)
[name], FECTEAU NO.:  [id number]   MEDICAL RECORD NO.:  [id number]          PATIENT TYPE:  INP   LOCATION:  2312                         FACILITY:  [facility]   PHYSICIAN:  Gilford Raid, M.D.     DATE OF BIRTH:  28-Oct-1934   DATE OF PROCEDURE:  12/26/2004  DATE OF DISCHARGE:                                 OPERATIVE REPORT   PREOPERATIVE DIAGNOSIS:  Severe three vessel coronary artery disease.   POSTOPERATIVE DIAGNOSIS:  Severe three vessel coronary artery disease.   PROCEDURE:  Median sternotomy, extracorporeal circulation,  coronary artery  bypass graft surgery x6 using a left internal mammary artery graft to left  anterior descending coronary artery, with a saphenous vein graft to the  third diagonal branch of the LAD, a sequential saphenous vein graft to the  second diagonal branch of the LAD and the obtuse marginal branch of the left  circumflex coronary artery, and a sequential saphenous vein graft  to the  posterior descending and posterolateral branches of the right coronary  artery.  Endoscopic vein harvesting from the right leg.   SURGEON:  Gilford Raid, M.D.   ASSISTANT:  Leta Baptist, [facility]   ANESTHESIA:  General endotracheal anesthesia.   CLINICAL HISTORY:  This patient is a 77 year old gentleman with history of  diabetes, hypertension, strong family history of coronary disease, and  obesity who has been asymptomatic.  He underwent a stress Cardiolite  examination in November of 2005, due to his cardiac risk factors and this  showed stress induced ischemia in the anterobasal region as well as  decreased uptake in the distal inferior wall and inferoapical region.  His  ejection fraction was about 65%.  He subsequently underwent cardiac  catheterization on December 13, 2004, which showed significant three vessel  coronary artery disease.  The left main was free of obstruction.  The LAD  was relatively small but had two large diagonal branches.   The LAD had about  80% eccentric stenosis before a large second diagonal branch and before the  dominant septal perforator.  There was a third diagonal branch that was a  large trifurcating vessel that had an ostial 75 to 80% stenosis.  The left  circumflex was relatively small and had 60 to 75% mid vessel stenosis before  a marginal branch.  The right coronary artery was diffusely diseased in  proximal and mid portions with eccentric tandem stenoses of the distal right  coronary artery before the posterior descending branch measuring 50 to 60%  and 75 to 80%.  Abdominal aortography also showed an infrarenal abdominal  aortic aneurysm.  Ejection fraction was about 65%.  After review of the  angiogram and examination of the patient, it was felt that coronary artery  bypass graft surgery was the best treatment to prevent ischemia and  infarction.  Abdominal ultrasound was performed to examine his aortic  aneurysm and this was measured to be about 3.9 cm in maximum diameter.  I  discussed the operative procedure of coronary artery bypass surgery  with  the patient and his wife and  son including alternatives, benefits, and risks  including bleeding, blood transfusion, infection, stroke, myocardial  infarction, graft failure, and death.  They understood and agreed to  proceed.   DESCRIPTION OF PROCEDURE:  The patient was taken to the operating room and  placed on the table in the supine position.  After induction of general  endotracheal anesthesia, a Foley catheter placed in the bladder using  sterile technique.  Then the chest, abdomen and both lower extremities were  prepped and draped in the usual sterile manner.  The chest was entered  through a median sternotomy incision and the pericardium opened in the  midline.  Examination of the heart showed good ventricular contractility.  The ascending aorta had no palpable plaques in it.   Then the left internal mammary artery was harvested from  the chest wall as a  pedicle graft.  This was a medium caliber vessel with excellent blood flow  through it.  At the same time, a segment of greater saphenous vein was  harvested from the right leg using endoscopic vein harvest technique.  This  vein was of medium size and good quality.   Then the patient was heparinized and when an adequate activated clotting  time was achieved, the distal ascending aorta was cannulated using a 22  French aortic cannula for arterial inflow.  Venous outflow was achieved  using a two-stage venous cannula for the right atrial appendage.  An  antegrade cardioplegia and vent cannula was inserted in the aortic root.   The patient was placed on cardiopulmonary bypass and the distal coronary  artery was identified.  The LAD was deep intramyocardial throughout its  proximal and mid portions.  It exited to the surface of the heart in its  distal portion where it was relatively small but felt to be graftable.  The  second diagonal branch was a medium size graftable vessel.  Third diagonal  branch was a medium size graftable vessel.  The obtuse marginal was a medium  size graftable vessel proximally before it bifurcated. The right coronary  artery was diffusely diseased and this diffuse disease extended out into the  mid portion of the posterior descending branch.  Initially I had just  planned on bypassing two the posterior descending arteries since this seemed  to communicate well with posterolateral branch but there was so much disease  in the proximal and mid posterior descending artery that I found it was  necessary to bypass both the posterior descending and posterolateral  branches.   Then the aorta was crossclamped and 500 mL of cold blood antegrade  cardioplegia was administered in the aortic root with quick arrest of the  heart.  Systemic hypothermia to 20 degrees C and topical hypothermia with iced saline was used.  The temperature probe was placed in  the septum,  insulating pad in the pericardium.   The first distal anastomosis was performed to the posterior descending  coronary artery.  The internal diameter of this vessel was about 1.75 mm.  The conduit used was a segment of greater saphenous vein.  The anastomosis  performed in a sequential side-to-side manner using continuous 7-0 Prolene  suture.  Flow was measured through the graft and was excellent.   The second distal anastomosis was performed to the posterolateral branch.  The internal diameter was about 2 mm.  The conduit was used was the same  segment of greater saphenous vein.  The anastomosis performed in a  sequential end-to-side manner using continuous  7-0 Prolene suture.  Flow was  again measured through the graft and was excellent.  Another dose of  Cardioplegia was given down the vein graft and in the aortic root.   The third distal anastomosis was performed to the second diagonal branch.  The internal diameter was about 1.6 mm.  The conduit used was a second  segment of greater saphenous vein.  The anastomosis performed in a  sequential side-to-side manner using continuous 7-0 Prolene suture.  Flow  was measured through the graft and was excellent.   The fourth distal anastomosis was performed to the obtuse marginal branch.  The internal diameter was about 1.5 to 1.6 mm.  The conduit used was the  same segment of greater saphenous vein and the anastomosis performed in a  sequential end-to-side manner using continuous 7-0 Prolene suture.  Flow was  measured through the graft and was excellent.   The patient was then given another dose of Cardioplegia down the vein grafts  and in the aortic root.  The fifth distal anastomosis was performed to the  third diagonal branch.  The internal diameter was 1.6 mm.  The conduit used  was a third segment of greater saphenous vein and the anastomosis performed  in an end-to-side manner using continuous 7-0 Prolene suture.  Flow  was  measured through the graft and was excellent.   The sixth distal anastomosis was then performed to the distal portion of  left anterior descending coronary artery.  The internal diameter of this  vessel was about 1.5 mm.  The conduit used was the left internal mammary  artery graft and this was brought through an opening in the left pericardium  anterior to the phrenic nerve.  It was anastomosed to the LAD in end-to-side  manner using continuous 8-0 Prolene suture.  The pedicle was tacked to the  epicardium with 6-0 Prolene sutures.  The patient was rewarmed to 37 degrees  C and the clamp removed from mammary pedicle.  There was rapid warming of  the ventricular septum and return of spontaneous ventricular fibrillation.  The crossclamp was then removed with a time of 71 minutes.  There was  spontaneous return of sinus rhythm.   Partial occlusion clamp was placed on the aortic root and the three vein graft anastomoses were performed to the aortic root. The clamp was removed  and the vein grafts deaired. The proximal and  distal anastomoses were  hemostatic and the lie of the grafts was satisfactory.  Graft markers were  placed around the proximals and atrial and ventricular pacing wires placed.  When the patient was rewarmed  he was weaned from cardiopulmonary bypass on  no inotropic agents.  Cardiac output was 5.5 liters per minute.  Protamine  was given and cannulae removed.  Hemostasis was achieved and chest tubes  placed in the left pleural space, posterior pericardium and the anterior  mediastinum.  The sternum was closed with number 6 stainless steel wires.  The chest incision was closed in layers.  Sponge, needle, and instrument  counts were correct according to the scrub nurse.  The patient was taken to  the SICU in stable condition.   DICTATION ENDED AT THIS POINT.      Brya   BB/MEDQ  D:  12/26/2004  T:  12/26/2004  Job:  [id number]

## 2011-04-26 NOTE — Op Note (Signed)
NAMEMOHAMUD, SCHRADER NO.:  [id number]   MEDICAL RECORD NO.:  [id number]          PATIENT TYPE:  AMB   LOCATION:  [facility]                         FACILITY:  [facility]   PHYSICIAN:  Mark C. Karsten Ro, M.D.  DATE OF BIRTH:  July 25, 1934   DATE OF PROCEDURE:  05/27/2005  DATE OF DISCHARGE:                                 OPERATIVE REPORT   PREOPERATIVE DIAGNOSES:  Gross hematuria localized to the right kidney.   POSTOPERATIVE DIAGNOSES:  Gross hematuria localized to the right kidney  secondary to over anticoagulation.   PROCEDURE:  Cystoscopy, bilateral double-J stent removal, bilateral  retrograde pyelograms with interpretation and right ureteroscopy.   SURGEON:  Mark C. Karsten Ro, M.D.   ANESTHESIA:  General.   BLOOD LOSS:  None.   SPECIMENS:  None.   DRAINS:  None.   COMPLICATIONS:  None.   INDICATIONS:  The patient is a 77 year old white male who was seen in  hospital consultation on Apr 28, 2005 for gross painless hematuria. He was  significantly over anticoagulated and workup at that time included  cystoscopy with bilateral retrograde pyelograms and bilateral ureteroscopy.  The left collecting system appeared normal, but the right appeared to have  significant clots present. Stents were placed on both sides and he was  brought back to the OR today for completion of his hematuria evaluation now  that time has been given for the clots to fully lyse and pass. We discussed  the risks, complications and alternatives and the patient understands and  elected to proceed with surgery.   DESCRIPTION OF OPERATION:  After informed consent, the patient brought to  the major OR, placed on the table, administered general anesthesia and then  moved to the dorsolithotomy position. Genitalia was sterilely prepped and  draped and initially a 22-French cystoscope with 12 degree lens was inserted  and the urethra was noted to be normal down to the sphincter which is intact  and  the prostatic urethra revealed no lesions. The bladder itself was free  of any tumors, stones or lesions. Both stents were identified. The left  stent was grasped with alligator forceps and removed. I then reinserted the  cystoscope and passed a 6-French open-ended ureteral catheter in the left  orifice and retrograde pyelogram was then performed.   Retrograde pyelogram was performed on the left side using Hypaque contrast.  It revealed an entirely normal ureter without evidence of filling defect or  mass effect. No hydronephrosis was seen. The intrarenal collecting system  appeared entirely normal with no filling defects and sharp calyces  throughout. This was felt to be a normal left retrograde pyelogram. I then  proceeded to remove the stent and perform a retrograde pyelogram on the  right side in an identical fashion. This also revealed a normal-appearing  ureter, renal pelvis and collecting system. There were no abnormalities  noted and it was felt to represent a normal right retrograde pyelogram.   Because I felt clots in the right renal pelvis,  I felt direct visualization  was indicated. I therefore passed a 0.038 inch floppy  tip guidewire up the  right ureter under direct fluoroscopic control. A 6 French flexible  ureteroscope was then passed over the guidewire into the renal pelvis and  the guidewire was removed. I then systematically inspected all calices, all  infundibula and the renal pelvis. I noted completely normal anatomy, no  evidence of bleeding, mo clots and no masses, papillary tumors or other  abnormalities. I then visualized the renal pelvis, the UPJ and the ureter  throughout its entire length as I removed the scope and fat and found this  to be normal as well. The bladder was then drained, the patient awakened and  taken to recovery room in stable satisfactory condition. He tolerated  procedure well with no intraoperative complications.   It appears over  anticoagulation was the cause of his hematuria, there  appears to be no lesions. No further workup is indicated at this time as  long as he has no further gross hematuria. He will be given a prescription  for Pyridium 200 mg #20 and follow-up p.r.n.       MCO/MEDQ  D:  05/27/2005  T:  05/28/2005  Job:  [id number]

## 2011-04-26 NOTE — Op Note (Signed)
NAMECHRISTOPHERJOSE, [name] NO.:  [id number]   MEDICAL RECORD NO.:  [id number]          PATIENT TYPE:  AMB   LOCATION:  DAY                          FACILITY:  [facility]   PHYSICIAN:  Mark C. Karsten Ro, M.D.  DATE OF BIRTH:  11/01/1934   DATE OF PROCEDURE:  05/02/2005  DATE OF DISCHARGE:                                 OPERATIVE REPORT   PREOPERATIVE DIAGNOSIS:  Gross hematuria.   POSTOPERATIVE DIAGNOSIS:  Gross hematuria.   PROCEDURE:  Cystoscopy, bilateral retrograde pyelogram with surgical  interpretation, bilateral ureteroscopy, bilateral double-J stent placement.   SURGEON:  Mark C. Karsten Ro, M.D.   ANESTHESIA:  General.   BLOOD LOSS:  Approximately 10 cc.   DRAINS:  A 6 French 26 cm double-J stent in the right ureter, a 4.7 French  26 cm double-J stent in the left ureter, and 18 French Foley catheter.   SPECIMENS:  Barbotage urine from right renal pelvis for cytology.   COMPLICATIONS:  None.   INDICATIONS:  Patient is a 76 year old white male with mild renal  insufficiency and gross hematuria after taking too much Coumadin and  becoming over-anticoagulated.  His hematuria has cleared.  He has had a  little bit of intermittent left lower quadrant pain on the left, but today  complained of a little bit of pain over on the right-hand side.  His urine  has cleared now.  His renal ultrasound reveals normal-appearing kidneys;  therefore, he is brought to the OR for further investigation of the source  of his hematuria.  The risks, complications, and alternatives were  discussed.   DESCRIPTION OF PROCEDURE:  After informed consent, the patient was brought  to the major OR and placed on the table.  After general anesthesia, was  moved to the dorsal lithotomy position.  His genitalia were thoroughly  prepped and draped.  Initially, a 87 French cystoscopy with 12 degree and 70  degree lenses were inserted into the bladder.  Using both lenses of the  bladder, were  fully inspected.  There was 2+ trabeculation noted, but the  bladder was otherwise noted to be free of any tumors, stones, or  inflammatory lesions.  Ureteral orifices were in normal configuration and  position.  The prostatic urethra had no lesions.  There was trilobar  hypertrophy and some elongation of the prostatic urethra.   An open-ended ureteral catheter was then placed in the left ureteral  orifice, and left retrograde pyelogram was performed under direct  fluoroscopy.  The ureter was visualized, and contrast was injected  throughout its length and noted to be entirely normal.  There appeared to be  a filling defect in the renal pelvis on the left-hand side.  I then  performed a right retrograde pyelogram in a similar fashion and noted what  appeared to be filling defects in the right kidney as well.   The guidewire was left in place, and the ureteral access sheath was passed  up the left ureter first into the area of the renal pelvis, and a 6 French  flexible ureteroscope was then passed  through the access sheath into the  renal pelvis.  The entire renal pelvis and all calyces were then inspected  and noted to be free of any tumor, stones, or inflammatory lesions.  No  active bleeding was noted.  The entire intrarenal collecting system was felt  to be normal.  I therefore left the guidewire in place and removed the  access sheath and ureteroscope.   Attention was directed to the right side, and I passed a guidewire under  fluoroscopy and passed the ureteral access sheath but could only advance it  to approximately the upper third of the ureter.  I therefore left that in  place and passed the flexible ureteroscope through the ureter.  I did not  note any stricture, but I was able to get the scope into the kidney.  It was  difficult to see because of a lot of old blood, I irrigated out a great deal  of old dark blood and noted what I thought was at first a lesion within the   renal pelvis but upon further inspection, it turned out to appear to be  mainly old organized clot.  I irrigated and sent the irrigant off for  cytology and then used a nitinol basket and grasped one portion of the  abnormal-appearing tissue in the renal pelvis and removed that; however,  upon inspection, it appeared to be merely a clot from the [place].  I  reinserted the flexible ureteroscope and continued to try to visualize the  entire collecting system but was unsuccessful due to the presence of old  blood and the clot, but I feel that this indicates the bleeding was from the  right side.  I therefore replaced the guidewire on this side as well and  then back-loaded the cystoscope.  I passed the 6 [place] double-J stent up  the right ureter and removed the guidewire with good curl being noted in the  renal pelvis and bladder.  I then back-loaded the cystoscope and passed the  6.4 French stent up the left ureter and removed the guidewire with good curl  being noted on that side as well.  The bladder was then drained.  The  patient was awakened and taken to the recovery room in stable, satisfactory  condition after an 80 French Foley catheter  was placed in the bladder.   At this point, ideally, I would like to have him maintained off his  Coumadin, allow the clot in his right renal pelvis to clear, and the ureter  to soften, so I can get my scope up there and get a better look around once  all the clot has lysed and passed.  Otherwise, his Coumadin could  potentially be restarted if necessary but will need to be restopped again  prior to repeat ureteroscopy.      MCO/MEDQ  D:  05/02/2005  T:  05/02/2005  Job:  [id number]

## 2011-08-07 ENCOUNTER — Other Ambulatory Visit (HOSPITAL_COMMUNITY): Payer: Self-pay | Admitting: Endocrinology

## 2011-08-07 DIAGNOSIS — E0590 Thyrotoxicosis, unspecified without thyrotoxic crisis or storm: Secondary | ICD-10-CM

## 2011-08-08 ENCOUNTER — Other Ambulatory Visit (HOSPITAL_COMMUNITY): Payer: Self-pay | Admitting: Endocrinology

## 2011-08-08 DIAGNOSIS — E0590 Thyrotoxicosis, unspecified without thyrotoxic crisis or storm: Secondary | ICD-10-CM

## 2011-08-22 ENCOUNTER — Ambulatory Visit (HOSPITAL_COMMUNITY): Payer: Medicare Other

## 2011-08-23 ENCOUNTER — Other Ambulatory Visit (HOSPITAL_COMMUNITY): Payer: Medicare Other

## 2011-08-29 ENCOUNTER — Ambulatory Visit (HOSPITAL_COMMUNITY): Payer: Medicare Other

## 2011-08-29 ENCOUNTER — Encounter (HOSPITAL_COMMUNITY)
Admission: RE | Admit: 2011-08-29 | Discharge: 2011-08-29 | Disposition: A | Discharge status: Home / Self Care | Payer: Medicare Other | Source: Ambulatory Visit | Attending: Endocrinology | Admitting: Endocrinology

## 2011-08-29 DIAGNOSIS — E0590 Thyrotoxicosis, unspecified without thyrotoxic crisis or storm: Secondary | ICD-10-CM | POA: Insufficient documentation

## 2011-08-30 ENCOUNTER — Encounter (HOSPITAL_COMMUNITY)
Admission: RE | Admit: 2011-08-30 | Discharge: 2011-08-30 | Disposition: A | Discharge status: Home / Self Care | Payer: Medicare Other | Source: Ambulatory Visit | Attending: Endocrinology | Admitting: Endocrinology

## 2011-08-30 MED ORDER — SODIUM IODIDE I 131 CAPSULE
13.2000 | Freq: Once | INTRAVENOUS | Status: AC | PRN
  Administered 2011-08-29: 13.2 via ORAL

## 2011-08-30 MED ORDER — SODIUM PERTECHNETATE TC 99M INJECTION
9.9000 | Freq: Once | INTRAVENOUS | Status: AC | PRN
  Administered 2011-08-30: 9.9 via INTRAVENOUS

## 2011-09-10 ENCOUNTER — Encounter (HOSPITAL_COMMUNITY)
Admission: RE | Admit: 2011-09-10 | Discharge: 2011-09-10 | Disposition: A | Discharge status: Home / Self Care | Payer: Medicare Other | Source: Ambulatory Visit | Attending: Endocrinology | Admitting: Endocrinology

## 2011-09-10 ENCOUNTER — Encounter (HOSPITAL_COMMUNITY): Payer: Self-pay

## 2011-09-10 DIAGNOSIS — E0520 Thyrotoxicosis with toxic multinodular goiter without thyrotoxic crisis or storm: Secondary | ICD-10-CM | POA: Insufficient documentation

## 2011-09-10 DIAGNOSIS — E0590 Thyrotoxicosis, unspecified without thyrotoxic crisis or storm: Secondary | ICD-10-CM

## 2011-09-10 HISTORY — DX: Thyrotoxicosis with diffuse goiter without thyrotoxic crisis or storm: E05.00

## 2011-09-10 MED ORDER — SODIUM IODIDE I 131 CAPSULE
32.3000 | Freq: Once | INTRAVENOUS | Status: AC | PRN
  Administered 2011-09-10: 32.3 via ORAL

## 2011-09-17 LAB — COMPREHENSIVE METABOLIC PANEL
ALT: 19
ALT: 24
AST: 12
AST: 15
Albumin: 2.8 — ABNORMAL LOW
Albumin: 3.7
Alkaline Phosphatase: 71
Alkaline Phosphatase: 95
BUN: 24 — ABNORMAL HIGH
BUN: 28 — ABNORMAL HIGH
CO2: 24
CO2: 29
Calcium: 8.1 — ABNORMAL LOW
Calcium: 9.3
Chloride: 108
Chloride: 106
Creatinine, Ser: 1.41
Creatinine, Ser: 1.52 — ABNORMAL HIGH
GFR calc Af Amer: 60 — ABNORMAL LOW
GFR calc Af Amer: 55 — ABNORMAL LOW
GFR calc non Af Amer: 50 — ABNORMAL LOW
GFR calc non Af Amer: 45 — ABNORMAL LOW
Glucose, Bld: 179 — ABNORMAL HIGH
Glucose, Bld: 111 — ABNORMAL HIGH
Potassium: 4.3
Potassium: 4.1
Sodium: 137
Sodium: 140
Total Bilirubin: 0.6
Total Bilirubin: 0.7
Total Protein: 5.2 — ABNORMAL LOW
Total Protein: 6.9

## 2011-09-17 LAB — TYPE AND SCREEN
ABO/RH(D): O NEG
Antibody Screen: POSITIVE
DAT, IgG: NEGATIVE
Donor AG Type: NEGATIVE
Donor AG Type: NEGATIVE
PT AG Type: NEGATIVE

## 2011-09-17 LAB — BLOOD GAS, ARTERIAL
Acid-base deficit: 3.7 — ABNORMAL HIGH
Bicarbonate: 20.4
Drawn by: 277331
FIO2: 0.21
O2 Saturation: 96.9
Patient temperature: 98.6
TCO2: 21.5
pCO2 arterial: 34.5 — ABNORMAL LOW
pH, Arterial: 7.391
pO2, Arterial: 88.6

## 2011-09-17 LAB — BASIC METABOLIC PANEL
BUN: 25 — ABNORMAL HIGH
CO2: 23
Calcium: 8.1 — ABNORMAL LOW
Chloride: 109
Creatinine, Ser: 1.27
GFR calc Af Amer: >60
GFR calc non Af Amer: 56 — ABNORMAL LOW
Glucose, Bld: 218 — ABNORMAL HIGH
Potassium: 4
Sodium: 136

## 2011-09-17 LAB — CBC
HCT: 39.7
HCT: 41.4
HCT: 48.9
Hemoglobin: 13.3
Hemoglobin: 13.9
Hemoglobin: 16.4
MCHC: 33.5
MCHC: 33.5
MCHC: 33.6
MCV: 90.6
MCV: 89.6
MCV: 89.9
Platelets: 136 — ABNORMAL LOW
Platelets: 130 — ABNORMAL LOW
Platelets: 149 — ABNORMAL LOW
RBC: 4.39
RBC: 4.62
RBC: 5.44
RDW: 15.4
RDW: 15.1
RDW: 15.4
WBC: 10.7 — ABNORMAL HIGH
WBC: 6.7
WBC: 8.4

## 2011-09-17 LAB — POCT I-STAT 7, (LYTES, BLD GAS, ICA,H+H)
Acid-base deficit: 4 — ABNORMAL HIGH
Bicarbonate: 21.9
Calcium, Ion: 1.15
HCT: 43
Hemoglobin: 14.6
O2 Saturation: 100
Operator id: 122891
Patient temperature: 36
Potassium: 4.5
Sodium: 136
TCO2: 23
pCO2 arterial: 39.5
pH, Arterial: 7.346 — ABNORMAL LOW
pO2, Arterial: 347 — ABNORMAL HIGH

## 2011-09-17 LAB — APTT
aPTT: 32
aPTT: 35

## 2011-09-17 LAB — PROTIME-INR
INR: 1
INR: 1
Prothrombin Time: 12.9
Prothrombin Time: 13.5

## 2011-11-19 ENCOUNTER — Encounter: Payer: Self-pay | Admitting: *Deleted

## 2011-11-19 ENCOUNTER — Encounter: Payer: Medicare Other | Attending: Endocrinology | Admitting: *Deleted

## 2011-11-19 DIAGNOSIS — E119 Type 2 diabetes mellitus without complications: Secondary | ICD-10-CM | POA: Insufficient documentation

## 2011-11-19 DIAGNOSIS — Z713 Dietary counseling and surveillance: Secondary | ICD-10-CM | POA: Insufficient documentation

## 2011-11-20 ENCOUNTER — Encounter: Payer: Self-pay | Admitting: *Deleted

## 2011-11-20 NOTE — Progress Notes (Signed)
  Medical Nutrition Therapy:  Appt start time: 1500 end time:  1600.   Assessment:  Primary concerns today: patient here for refresher on diabetes education and carb counting.   MEDICATIONS: see list. Diabetes medication is Novolin 70/30 @ 60 units BID   DIETARY INTAKE:  Usual eating pattern includes 3 meals and 1 snack per day.  Everyday foods include good variety of all food groups.    24-hr recall:  B ( AM): Cheerios with banana and 2% milk  Snk ( AM): none  L ( PM): sandwich and occasionally chips Snk ( PM): none D ( PM): eat out 4-5 days/week and fast food, [place] or [employer].  Snk ( PM): cookies,  Beverages: water, unsweetened tea or diet soda  Usual physical activity: not time to discuss today, plan to follow up with this at next visit  Estimated energy needs: 2000 calories 225 g carbohydrates 150 g protein 56 g fat  Progress Towards Goal(s):  In progress.   Nutritional Diagnosis:  NI-5.8.4 Inconsistent carbohydrate intake As related to diabetes control.  As evidenced by A1c of 8.5%.    Intervention:  Nutrition counseling provided on basic physiology of diabetes, action of 70/30 insulin, carb counting techniques and rationale of consistent carb meals. Goals:  Follow Diabetes Meal Plan as instructed  Eat 3 meals and snacks if hungry  Limit carbohydrate intake to 60 grams carbohydrate/meal  Limit carbohydrate intake to 30 grams carbohydrate/snack  Add lean protein foods to meals/snacks  Monitor glucose levels as instructed by your doctor  Plan to discuss physical activity plan at next visit Handouts given during visit include:  Living Well with Type 2 Diabetes  Carb Counting and Beyond and Label Reading handouts  Monitoring/Evaluation:  Dietary intake, SMBG, and body weight in 4 week(s).

## 2011-11-20 NOTE — Patient Instructions (Signed)
Goals:  Follow Diabetes Meal Plan as instructed  Eat 3 meals and snacks if hungry  Limit carbohydrate intake to 60 grams carbohydrate/meal  Limit carbohydrate intake to 30 grams carbohydrate/snack  Add lean protein foods to meals/snacks  Monitor glucose levels as instructed by your doctor  Plan to discuss physical activity plan at next visit

## 2011-12-17 ENCOUNTER — Encounter: Payer: Medicare Other | Attending: Endocrinology | Admitting: *Deleted

## 2011-12-17 ENCOUNTER — Encounter: Payer: Self-pay | Admitting: *Deleted

## 2011-12-17 DIAGNOSIS — Z713 Dietary counseling and surveillance: Secondary | ICD-10-CM | POA: Insufficient documentation

## 2011-12-17 DIAGNOSIS — E119 Type 2 diabetes mellitus without complications: Secondary | ICD-10-CM | POA: Insufficient documentation

## 2011-12-17 NOTE — Progress Notes (Signed)
  Medical Nutrition Therapy:  Appt start time: 1500 end time:  [id number].  Assessment:  Primary concerns today: Patient here for 1 month follow up visit for diabetes. He states he is watching his Carbs more carefully and that Dr. Chalmers Cater was pleased with the improvement in his BGs at his appointment last week. He states his FBGs have been below 80 mg/dl and she reduced his evening dose of insulin to 40 units. He also states his BG are still below 90 in AM, and running about 150 pre supper. He also states he is taking his evening dose of insulin after supper. I suggested he move that to a pre-supper time. I asked patient to contact MD if his BG continues below 90 mg/dl so she can assess his dose accordingly.   MEDICATIONS: see list. Diabetes meds are Novolog 70/30 @ 60 units in AM and 40 units PM  DIETARY INTAKE: indicates he is averaging 3-4 Carb Choices per meal. He is not carb counting for his snacks yet.  Recent physical activity: He raked leaves yesterday for about an hour   Progress Towards Goal(s):  In progress.   Nutritional Diagnosis:  NI-1.5 Excessive energy intake As related to weight control.  As evidenced by weight gain of 2 pounds in past month and sedentary lifestyle.    Intervention:  Nutrition counseling and diabetes education provided. Recommend that he move evening dose of insulin to pre-supper, increase activity level by walking every other day for 10-20 minutes, and start bringing Glucose Tabs with him rather than in his wife's purse. Plan: Continue with 60 units insulin pre-breakfast Change time of evening dose of 40 units to pre supper vs. After supper Increase activity by walking at [facility] or a Park for 10-20 minutes every other day as tolerated Bring Glucose Tabs or other fast carb food/drink with you when exercising Check BG after exercising to evaluate effect on blood sugar. Continue with meal plan of 3-4 Carb Choices per meal.  Monitoring/Evaluation:  Dietary intake,  exercise, change in time of evening insulin dose, and body weight in 6 week(s).

## 2011-12-17 NOTE — Patient Instructions (Signed)
Plan: Continue with 60 units insulin pre-breakfast Change time of evening dose of 40 units to pre supper vs. After supper Increase activity by walking at [facility] or a Park for 10-20 minutes every other day as tolerated Bring Glucose Tabs or other fast carb food/drink with you when exercising Check BG after exercising to evaluate effect on blood sugar. Continue with meal plan of 3-4 Carb Choices per meal.

## 2012-01-28 ENCOUNTER — Encounter: Payer: Medicare Other | Attending: Endocrinology | Admitting: *Deleted

## 2012-01-28 ENCOUNTER — Encounter: Payer: Self-pay | Admitting: *Deleted

## 2012-01-28 DIAGNOSIS — Z713 Dietary counseling and surveillance: Secondary | ICD-10-CM | POA: Insufficient documentation

## 2012-01-28 DIAGNOSIS — E119 Type 2 diabetes mellitus without complications: Secondary | ICD-10-CM | POA: Insufficient documentation

## 2012-01-28 NOTE — Progress Notes (Signed)
  Medical Nutrition Therapy:  Appt start time: 1500 end time:  [id number].  Assessment:  Primary concerns today: Patient here for follow up visit for diabetes education and weight loss. His BG are doing quite well, but he has gained another 6 pounds since his last visit. He does fair with his carb counting but often ignores portion sizes.  MEDICATIONS: see list. Diabetes meds are Novolog 70/30 @ 60 units in AM and 40 units PM  DIETARY INTAKE: indicates he is averaging 2-4 Carb Choices per meal.  Recent physical activity: He raked leaves yesterday for about an hour. He plans to travel to the beach every week to [place] of the pier.   Progress Towards Goal(s):  In progress.   Nutritional Diagnosis:  NI-1.5 Excessive energy intake As related to weight control.  As evidenced by weight gain of 2 pounds in past month and sedentary lifestyle.    Intervention: Reviewed his carb counting and reinforced value of consistent carb meals. Commended him on his self monitoring of his BG twice a day. Encouraged him to initiate an increase in his activity level to help with the weight loss.  Plan: Continue with carb counting at meals Continue reading food labels for total carbohydrate and fat grams  Continue to check BG twice a day Consider ways to increase activity level to spend extra calories and start to lose weight.  Monitoring/Evaluation:  Dietary intake, exercise, and body weight in 6 week(s).

## 2012-01-29 ENCOUNTER — Encounter: Payer: Self-pay | Admitting: *Deleted

## 2012-01-29 NOTE — Patient Instructions (Signed)
Plan: Continue with carb counting at meals Continue reading food labels for total carbohydrate and fat grams  Continue to check BG twice a day Consider ways to increase activity level to spend extra calories and start to lose weight.

## 2012-03-03 ENCOUNTER — Encounter: Payer: Self-pay | Admitting: Neurosurgery

## 2012-03-04 ENCOUNTER — Ambulatory Visit: Payer: Medicare Other | Admitting: Vascular Surgery

## 2012-03-04 ENCOUNTER — Encounter: Payer: Self-pay | Admitting: Neurosurgery

## 2012-03-04 ENCOUNTER — Ambulatory Visit (INDEPENDENT_AMBULATORY_CARE_PROVIDER_SITE_OTHER): Payer: Medicare Other | Admitting: *Deleted

## 2012-03-04 ENCOUNTER — Ambulatory Visit (INDEPENDENT_AMBULATORY_CARE_PROVIDER_SITE_OTHER): Payer: Medicare Other | Admitting: Neurosurgery

## 2012-03-04 VITALS — BP 120/68 | HR 65 | Resp 22 | Ht 70.0 in | Wt 250.0 lb

## 2012-03-04 DIAGNOSIS — I714 Abdominal aortic aneurysm, without rupture: Secondary | ICD-10-CM

## 2012-03-04 DIAGNOSIS — Z48812 Encounter for surgical aftercare following surgery on the circulatory system: Secondary | ICD-10-CM

## 2012-03-04 NOTE — Progress Notes (Signed)
Subjective:     Patient ID: [name], male   DOB: 02-Nov-1934, 78 y.o.   MRN: [id number]  HPI: This is a 78 year old patient followed by Dr. Scot Dock 5 year status post AAA stent graft by Dr. Amedeo Plenty. The patient reports no difficulties no abdominal pain, he's had no further surgeries of any type since this procedure in 08. His last duplex in March of 2011 showed shrinkage of the aneurysm sac which continues to shrink as evidenced by the duplex today.   Review of Systems: Review of systems is notable for the difficulties described above otherwise the 12 point review of systems is unremarkable. No abdominal pain, no dysphasia, no signs or symptoms of CVA.     Objective:   Physical Exam: Vital signs are stable the patient is afebrile, no abdominal pain no distention. Due to his abdominal girth is not possible to palpate the AAA. Lungs are clear to auscultation bilaterally, there are no carotid bruits present. 2+ radial pulses bilaterally, no popliteal pulses palpable, 1+ PT and DP bilaterally.     Assessment:      79 year old male patient followed by Dr. Scot Dock 5 year status post AAA repair by Dr. Amedeo Plenty doing well. Vascular lab exam: Review of sac size September 2011 was 3.8, today is 3.71.    Plan:     Discussed this patient prior to his arrival with Dr. Scot Dock, since the patient is doing so well and has no difficulties his followup with [place] will be when necessary per Dr. Scot Dock.  Beatris Ship ANP  Clinic M.D.: Scot Dock

## 2012-03-05 NOTE — Procedures (Unsigned)
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  HISTORY: Diabetes:  Yes. Cardiac:  Yes. Hypertension:  Yes.  EXAM:  Previous sac size 08/10/2010 3.88 AP, 4.59 transverse.  IMPRESSION: 1. Aorta and endograft appear patent where visualized. 2. No evidence of endoleak, limited visualization due to body habitus     and bowel gas. 3. Measurements today 3.718 AP, 4.86 transverse. 4. No significant change since previous study of 08/10/2010.  ___________________________________________ Judeth Cornfield. Scot Dock, M.D.  SS/MEDQ  D:  03/04/2012  T:  03/04/2012  Job:  [id number]

## 2013-01-05 DIAGNOSIS — N184 Chronic kidney disease, stage 4 (severe): Secondary | ICD-10-CM | POA: Diagnosis not present

## 2013-01-05 DIAGNOSIS — I129 Hypertensive chronic kidney disease with stage 1 through stage 4 chronic kidney disease, or unspecified chronic kidney disease: Secondary | ICD-10-CM | POA: Diagnosis not present

## 2013-01-05 DIAGNOSIS — D649 Anemia, unspecified: Secondary | ICD-10-CM | POA: Diagnosis not present

## 2013-01-05 DIAGNOSIS — I10 Essential (primary) hypertension: Secondary | ICD-10-CM | POA: Diagnosis not present

## 2013-01-05 DIAGNOSIS — E785 Hyperlipidemia, unspecified: Secondary | ICD-10-CM | POA: Diagnosis not present

## 2013-01-05 DIAGNOSIS — E119 Type 2 diabetes mellitus without complications: Secondary | ICD-10-CM | POA: Diagnosis not present

## 2013-01-05 DIAGNOSIS — E213 Hyperparathyroidism, unspecified: Secondary | ICD-10-CM | POA: Diagnosis not present

## 2013-01-05 DIAGNOSIS — N2581 Secondary hyperparathyroidism of renal origin: Secondary | ICD-10-CM | POA: Diagnosis not present

## 2013-01-05 DIAGNOSIS — E669 Obesity, unspecified: Secondary | ICD-10-CM | POA: Diagnosis not present

## 2013-02-24 DIAGNOSIS — M545 Low back pain, unspecified: Secondary | ICD-10-CM | POA: Diagnosis not present

## 2013-03-01 DIAGNOSIS — N184 Chronic kidney disease, stage 4 (severe): Secondary | ICD-10-CM | POA: Diagnosis not present

## 2013-03-01 DIAGNOSIS — E213 Hyperparathyroidism, unspecified: Secondary | ICD-10-CM | POA: Diagnosis not present

## 2013-03-17 ENCOUNTER — Ambulatory Visit: Payer: Medicare Other | Admitting: Neurosurgery

## 2013-03-17 DIAGNOSIS — I714 Abdominal aortic aneurysm, without rupture: Secondary | ICD-10-CM | POA: Diagnosis not present

## 2013-03-17 DIAGNOSIS — I4891 Unspecified atrial fibrillation: Secondary | ICD-10-CM | POA: Diagnosis not present

## 2013-03-17 DIAGNOSIS — I2510 Atherosclerotic heart disease of native coronary artery without angina pectoris: Secondary | ICD-10-CM | POA: Diagnosis not present

## 2013-03-17 DIAGNOSIS — I10 Essential (primary) hypertension: Secondary | ICD-10-CM | POA: Diagnosis not present

## 2013-03-17 DIAGNOSIS — E1129 Type 2 diabetes mellitus with other diabetic kidney complication: Secondary | ICD-10-CM | POA: Diagnosis not present

## 2013-03-23 DIAGNOSIS — I129 Hypertensive chronic kidney disease with stage 1 through stage 4 chronic kidney disease, or unspecified chronic kidney disease: Secondary | ICD-10-CM | POA: Diagnosis not present

## 2013-03-23 DIAGNOSIS — N184 Chronic kidney disease, stage 4 (severe): Secondary | ICD-10-CM | POA: Diagnosis not present

## 2013-03-23 DIAGNOSIS — I10 Essential (primary) hypertension: Secondary | ICD-10-CM | POA: Diagnosis not present

## 2013-03-23 DIAGNOSIS — E119 Type 2 diabetes mellitus without complications: Secondary | ICD-10-CM | POA: Diagnosis not present

## 2013-03-23 DIAGNOSIS — R809 Proteinuria, unspecified: Secondary | ICD-10-CM | POA: Diagnosis not present

## 2013-03-23 DIAGNOSIS — D649 Anemia, unspecified: Secondary | ICD-10-CM | POA: Diagnosis not present

## 2013-04-02 ENCOUNTER — Other Ambulatory Visit: Payer: Self-pay | Admitting: *Deleted

## 2013-04-02 DIAGNOSIS — Z48812 Encounter for surgical aftercare following surgery on the circulatory system: Secondary | ICD-10-CM

## 2013-04-02 DIAGNOSIS — I714 Abdominal aortic aneurysm, without rupture: Secondary | ICD-10-CM

## 2013-04-05 DIAGNOSIS — E213 Hyperparathyroidism, unspecified: Secondary | ICD-10-CM | POA: Diagnosis not present

## 2013-04-05 DIAGNOSIS — N184 Chronic kidney disease, stage 4 (severe): Secondary | ICD-10-CM | POA: Diagnosis not present

## 2013-04-09 ENCOUNTER — Encounter (INDEPENDENT_AMBULATORY_CARE_PROVIDER_SITE_OTHER): Payer: Medicare Other | Admitting: *Deleted

## 2013-04-09 DIAGNOSIS — I714 Abdominal aortic aneurysm, without rupture, unspecified: Secondary | ICD-10-CM

## 2013-04-09 DIAGNOSIS — Z48812 Encounter for surgical aftercare following surgery on the circulatory system: Secondary | ICD-10-CM | POA: Diagnosis not present

## 2013-04-12 ENCOUNTER — Other Ambulatory Visit: Payer: Self-pay

## 2013-04-12 DIAGNOSIS — Z48812 Encounter for surgical aftercare following surgery on the circulatory system: Secondary | ICD-10-CM

## 2013-04-12 DIAGNOSIS — I714 Abdominal aortic aneurysm, without rupture, unspecified: Secondary | ICD-10-CM

## 2013-04-14 ENCOUNTER — Encounter: Payer: Self-pay | Admitting: Vascular Surgery

## 2013-04-22 DIAGNOSIS — I714 Abdominal aortic aneurysm, without rupture: Secondary | ICD-10-CM | POA: Diagnosis not present

## 2013-04-22 DIAGNOSIS — I10 Essential (primary) hypertension: Secondary | ICD-10-CM | POA: Diagnosis not present

## 2013-04-22 DIAGNOSIS — E1129 Type 2 diabetes mellitus with other diabetic kidney complication: Secondary | ICD-10-CM | POA: Diagnosis not present

## 2013-04-22 DIAGNOSIS — E890 Postprocedural hypothyroidism: Secondary | ICD-10-CM | POA: Diagnosis not present

## 2013-04-22 DIAGNOSIS — IMO0001 Reserved for inherently not codable concepts without codable children: Secondary | ICD-10-CM | POA: Diagnosis not present

## 2013-04-22 DIAGNOSIS — E782 Mixed hyperlipidemia: Secondary | ICD-10-CM | POA: Diagnosis not present

## 2013-04-22 DIAGNOSIS — Z86010 Personal history of colonic polyps: Secondary | ICD-10-CM | POA: Diagnosis not present

## 2013-04-22 DIAGNOSIS — Z1331 Encounter for screening for depression: Secondary | ICD-10-CM | POA: Diagnosis not present

## 2013-04-22 DIAGNOSIS — I2510 Atherosclerotic heart disease of native coronary artery without angina pectoris: Secondary | ICD-10-CM | POA: Diagnosis not present

## 2013-04-22 DIAGNOSIS — Z0000 Encounter for general adult medical examination without abnormal findings: Secondary | ICD-10-CM | POA: Diagnosis not present

## 2013-04-27 DIAGNOSIS — I10 Essential (primary) hypertension: Secondary | ICD-10-CM | POA: Diagnosis not present

## 2013-04-27 DIAGNOSIS — E0500 Thyrotoxicosis with diffuse goiter without thyrotoxic crisis or storm: Secondary | ICD-10-CM | POA: Diagnosis not present

## 2013-04-27 DIAGNOSIS — IMO0001 Reserved for inherently not codable concepts without codable children: Secondary | ICD-10-CM | POA: Diagnosis not present

## 2013-04-27 DIAGNOSIS — E7800 Pure hypercholesterolemia, unspecified: Secondary | ICD-10-CM | POA: Diagnosis not present

## 2013-05-13 DIAGNOSIS — I10 Essential (primary) hypertension: Secondary | ICD-10-CM | POA: Diagnosis not present

## 2013-05-13 DIAGNOSIS — E213 Hyperparathyroidism, unspecified: Secondary | ICD-10-CM | POA: Diagnosis not present

## 2013-05-13 DIAGNOSIS — E119 Type 2 diabetes mellitus without complications: Secondary | ICD-10-CM | POA: Diagnosis not present

## 2013-05-13 DIAGNOSIS — N2581 Secondary hyperparathyroidism of renal origin: Secondary | ICD-10-CM | POA: Diagnosis not present

## 2013-05-13 DIAGNOSIS — N184 Chronic kidney disease, stage 4 (severe): Secondary | ICD-10-CM | POA: Diagnosis not present

## 2013-05-13 DIAGNOSIS — I129 Hypertensive chronic kidney disease with stage 1 through stage 4 chronic kidney disease, or unspecified chronic kidney disease: Secondary | ICD-10-CM | POA: Diagnosis not present

## 2013-05-28 DIAGNOSIS — N184 Chronic kidney disease, stage 4 (severe): Secondary | ICD-10-CM | POA: Diagnosis not present

## 2013-07-09 DIAGNOSIS — E1139 Type 2 diabetes mellitus with other diabetic ophthalmic complication: Secondary | ICD-10-CM | POA: Diagnosis not present

## 2013-07-09 DIAGNOSIS — E11319 Type 2 diabetes mellitus with unspecified diabetic retinopathy without macular edema: Secondary | ICD-10-CM | POA: Diagnosis not present

## 2013-07-26 DIAGNOSIS — E119 Type 2 diabetes mellitus without complications: Secondary | ICD-10-CM | POA: Diagnosis not present

## 2013-07-26 DIAGNOSIS — L608 Other nail disorders: Secondary | ICD-10-CM | POA: Diagnosis not present

## 2013-08-03 DIAGNOSIS — E890 Postprocedural hypothyroidism: Secondary | ICD-10-CM | POA: Diagnosis not present

## 2013-08-03 DIAGNOSIS — IMO0001 Reserved for inherently not codable concepts without codable children: Secondary | ICD-10-CM | POA: Diagnosis not present

## 2013-08-03 DIAGNOSIS — I10 Essential (primary) hypertension: Secondary | ICD-10-CM | POA: Diagnosis not present

## 2013-08-23 DIAGNOSIS — I10 Essential (primary) hypertension: Secondary | ICD-10-CM | POA: Diagnosis not present

## 2013-08-23 DIAGNOSIS — N184 Chronic kidney disease, stage 4 (severe): Secondary | ICD-10-CM | POA: Diagnosis not present

## 2013-08-23 DIAGNOSIS — N2581 Secondary hyperparathyroidism of renal origin: Secondary | ICD-10-CM | POA: Diagnosis not present

## 2013-08-23 DIAGNOSIS — I129 Hypertensive chronic kidney disease with stage 1 through stage 4 chronic kidney disease, or unspecified chronic kidney disease: Secondary | ICD-10-CM | POA: Diagnosis not present

## 2013-08-23 DIAGNOSIS — E119 Type 2 diabetes mellitus without complications: Secondary | ICD-10-CM | POA: Diagnosis not present

## 2013-09-03 DIAGNOSIS — Z23 Encounter for immunization: Secondary | ICD-10-CM | POA: Diagnosis not present

## 2013-09-07 DIAGNOSIS — I120 Hypertensive chronic kidney disease with stage 5 chronic kidney disease or end stage renal disease: Secondary | ICD-10-CM | POA: Diagnosis not present

## 2013-09-07 DIAGNOSIS — N184 Chronic kidney disease, stage 4 (severe): Secondary | ICD-10-CM | POA: Diagnosis not present

## 2013-10-26 DIAGNOSIS — L608 Other nail disorders: Secondary | ICD-10-CM | POA: Diagnosis not present

## 2013-10-26 DIAGNOSIS — E119 Type 2 diabetes mellitus without complications: Secondary | ICD-10-CM | POA: Diagnosis not present

## 2013-10-26 DIAGNOSIS — I872 Venous insufficiency (chronic) (peripheral): Secondary | ICD-10-CM | POA: Diagnosis not present

## 2013-11-02 ENCOUNTER — Encounter: Payer: Medicare Other | Attending: Endocrinology | Admitting: *Deleted

## 2013-11-02 ENCOUNTER — Encounter: Payer: Self-pay | Admitting: *Deleted

## 2013-11-02 VITALS — Ht 70.0 in | Wt 258.1 lb

## 2013-11-02 DIAGNOSIS — E119 Type 2 diabetes mellitus without complications: Secondary | ICD-10-CM | POA: Diagnosis not present

## 2013-11-02 DIAGNOSIS — Z713 Dietary counseling and surveillance: Secondary | ICD-10-CM | POA: Diagnosis not present

## 2013-11-02 NOTE — Patient Instructions (Signed)
Plan:  Aim for 3-4 Carb Choices per meal (45-60 grams) +/- 1 either way  Aim for 0-1 Carbs per snack if hungry  Consider reading food labels for Total Carbohydrate and Fat Grams of foods Consider  increasing your activity level by walking for 30 minutes daily as tolerated Continue checking BG at alternate times per day as directed by MD  Consider taking medication (Insulin with you all the time) as directed by MD Take glucometer with you at all times.

## 2013-11-02 NOTE — Progress Notes (Addendum)
Appt start time: 1300 end time:  1400.  Assessment:  Patient was seen on  11/02/13 for individual diabetes education. [name] comes with his wife who is also a patient with questions about food. Test glucose BID. FBS values 100-125mg /dl and 2hpp 226-250mg /dl. Takes morning insulin on time, evening dose varies. Formal education was provided at time of diagnosis. Both partners do the grocery shopping. [name] does the cooking. However they eat out at least 4 times per week.  Current HbA1c: 8.4%  Preferred Learning Style:   No preference indicated   Learning Readiness:   Contemplating  MEDICATIONS: See list: Novolog 70/30 BID  DIETARY INTAKE:  B (0100 AM): rice krispies 3C, blueberries, [employer] mini donuts/  Hard boilded egg, english muffin [employer] mini donuts, hot tea with lemon, sweet 'n low Snk ( AM): chocolate candy  L (1800 PM): [employer] chicken bacon sandwich, fries, diet soda Snk ( PM): chocolate candy D ( 0230PM): [employer] Salad, chicken wings Snk ( PM): peanut butter crackers Beverages: hot tea, diet soda, coffee, unsweet ice tea  Usual physical activity: none  Intervention:  Nutrition counseling provided.  Discussed diabetes disease process and treatment options.  Discussed physiology of diabetes and role of obesity on insulin resistance.  Encouraged moderate weight reduction to improve glucose levels.  Discussed role of medications and diet in glucose control  Provided education on macronutrients on glucose levels.  Provided education on carb counting, importance of regularly scheduled meals/snacks, and meal planning  Discussed effects of physical activity on glucose levels and long-term glucose control.  Recommended 150 minutes of physical activity/week.  Reviewed patient medications.  Discussed role of medication on blood glucose and possible side effects  Discussed blood glucose monitoring and interpretation.  Discussed recommended target ranges and individual ranges.     Described short-term complications: hyper- and hypo-glycemia.  Discussed causes,symptoms, and treatment options.  Discussed prevention, detection, and treatment of long-term complications.  Discussed the role of prolonged elevated glucose levels on body systems.  Discussed role of stress on blood glucose levels and discussed strategies to manage psychosocial issues.  Discussed recommendations for long-term diabetes self-care.  Established checklist for medical, dental, and emotional self-care.  Plan:  Aim for 3-4 Carb Choices per meal (45-60 grams) +/- 1 either way  Aim for 0-1 Carbs per snack if hungry  Consider reading food labels for Total Carbohydrate and Fat Grams of foods Consider  increasing your activity level by walking for 30 minutes daily as tolerated Continue checking BG at alternate times per day as directed by MD  Consider taking medication (Insulin with you all the time) as directed by MD Take glucometer with you at all times.  Teaching Method Utilized:  Visual Auditory Hands on  Handouts given during visit include: Living Well with Diabetes Carb Counting and Food Label handouts Meal Plan Card Snack sheet A1c interpretation sheet  Barriers to learning/adherence to lifestyle change: depression, motivation  Diabetes self-care support plan:   [facility] support group  family  Demonstrated degree of understanding via:  Teach Back   Monitoring/Evaluation:  Dietary intake, exercise, test glucose, and body weight return prn.

## 2013-12-07 DIAGNOSIS — IMO0001 Reserved for inherently not codable concepts without codable children: Secondary | ICD-10-CM | POA: Diagnosis not present

## 2013-12-07 DIAGNOSIS — E890 Postprocedural hypothyroidism: Secondary | ICD-10-CM | POA: Diagnosis not present

## 2013-12-13 DIAGNOSIS — IMO0001 Reserved for inherently not codable concepts without codable children: Secondary | ICD-10-CM | POA: Diagnosis not present

## 2013-12-13 DIAGNOSIS — E890 Postprocedural hypothyroidism: Secondary | ICD-10-CM | POA: Diagnosis not present

## 2013-12-20 DIAGNOSIS — N2581 Secondary hyperparathyroidism of renal origin: Secondary | ICD-10-CM | POA: Diagnosis not present

## 2013-12-20 DIAGNOSIS — D649 Anemia, unspecified: Secondary | ICD-10-CM | POA: Diagnosis not present

## 2013-12-20 DIAGNOSIS — I129 Hypertensive chronic kidney disease with stage 1 through stage 4 chronic kidney disease, or unspecified chronic kidney disease: Secondary | ICD-10-CM | POA: Diagnosis not present

## 2013-12-20 DIAGNOSIS — I2510 Atherosclerotic heart disease of native coronary artery without angina pectoris: Secondary | ICD-10-CM | POA: Diagnosis not present

## 2013-12-20 DIAGNOSIS — I4891 Unspecified atrial fibrillation: Secondary | ICD-10-CM | POA: Diagnosis not present

## 2013-12-20 DIAGNOSIS — N184 Chronic kidney disease, stage 4 (severe): Secondary | ICD-10-CM | POA: Diagnosis not present

## 2013-12-28 DIAGNOSIS — E119 Type 2 diabetes mellitus without complications: Secondary | ICD-10-CM | POA: Diagnosis not present

## 2013-12-28 DIAGNOSIS — S91109A Unspecified open wound of unspecified toe(s) without damage to nail, initial encounter: Secondary | ICD-10-CM | POA: Diagnosis not present

## 2013-12-28 DIAGNOSIS — L97309 Non-pressure chronic ulcer of unspecified ankle with unspecified severity: Secondary | ICD-10-CM | POA: Diagnosis not present

## 2013-12-28 DIAGNOSIS — L608 Other nail disorders: Secondary | ICD-10-CM | POA: Diagnosis not present

## 2013-12-28 DIAGNOSIS — I872 Venous insufficiency (chronic) (peripheral): Secondary | ICD-10-CM | POA: Diagnosis not present

## 2014-01-11 DIAGNOSIS — D235 Other benign neoplasm of skin of trunk: Secondary | ICD-10-CM | POA: Diagnosis not present

## 2014-01-11 DIAGNOSIS — C44711 Basal cell carcinoma of skin of unspecified lower limb, including hip: Secondary | ICD-10-CM | POA: Diagnosis not present

## 2014-01-11 DIAGNOSIS — I872 Venous insufficiency (chronic) (peripheral): Secondary | ICD-10-CM | POA: Diagnosis not present

## 2014-01-12 DIAGNOSIS — L97309 Non-pressure chronic ulcer of unspecified ankle with unspecified severity: Secondary | ICD-10-CM | POA: Diagnosis not present

## 2014-01-18 ENCOUNTER — Telehealth: Payer: Self-pay | Admitting: *Deleted

## 2014-01-18 MED ORDER — METOPROLOL SUCCINATE ER 25 MG PO TB24: 25.0000 mg | ORAL_TABLET | Freq: Every day | ORAL | Status: DC

## 2014-01-18 NOTE — Telephone Encounter (Signed)
Patients wife request metoprolol refill for patient to be sent to [facility] in [place]. Thanks, MI

## 2014-02-08 ENCOUNTER — Encounter: Payer: Self-pay | Admitting: Interventional Cardiology

## 2014-02-22 DIAGNOSIS — Z85828 Personal history of other malignant neoplasm of skin: Secondary | ICD-10-CM | POA: Diagnosis not present

## 2014-02-22 DIAGNOSIS — I872 Venous insufficiency (chronic) (peripheral): Secondary | ICD-10-CM | POA: Diagnosis not present

## 2014-03-01 DIAGNOSIS — L608 Other nail disorders: Secondary | ICD-10-CM | POA: Diagnosis not present

## 2014-03-01 DIAGNOSIS — E119 Type 2 diabetes mellitus without complications: Secondary | ICD-10-CM | POA: Diagnosis not present

## 2014-03-16 ENCOUNTER — Ambulatory Visit: Payer: Medicare Other | Admitting: Interventional Cardiology

## 2014-03-22 ENCOUNTER — Ambulatory Visit (INDEPENDENT_AMBULATORY_CARE_PROVIDER_SITE_OTHER): Payer: Medicare Other | Admitting: Interventional Cardiology

## 2014-03-22 ENCOUNTER — Encounter: Payer: Self-pay | Admitting: Interventional Cardiology

## 2014-03-22 VITALS — BP 130/74 | HR 60 | Ht 70.0 in | Wt 259.0 lb

## 2014-03-22 DIAGNOSIS — I2510 Atherosclerotic heart disease of native coronary artery without angina pectoris: Secondary | ICD-10-CM | POA: Insufficient documentation

## 2014-03-22 DIAGNOSIS — E042 Nontoxic multinodular goiter: Secondary | ICD-10-CM | POA: Diagnosis not present

## 2014-03-22 DIAGNOSIS — E119 Type 2 diabetes mellitus without complications: Secondary | ICD-10-CM

## 2014-03-22 DIAGNOSIS — E785 Hyperlipidemia, unspecified: Secondary | ICD-10-CM

## 2014-03-22 DIAGNOSIS — E0821 Diabetes mellitus due to underlying condition with diabetic nephropathy: Secondary | ICD-10-CM | POA: Insufficient documentation

## 2014-03-22 DIAGNOSIS — I10 Essential (primary) hypertension: Secondary | ICD-10-CM | POA: Diagnosis not present

## 2014-03-22 DIAGNOSIS — I25810 Atherosclerosis of coronary artery bypass graft(s) without angina pectoris: Secondary | ICD-10-CM

## 2014-03-22 NOTE — Patient Instructions (Signed)
Your physician recommends that you continue on your current medications as directed. Please refer to the Current Medication list given to you today.  Your physician wants you to follow-up in: 1 Year You will receive a reminder letter in the mail two months in advance. If you don't receive a letter, please call our office to schedule the follow-up appointment.

## 2014-03-22 NOTE — Progress Notes (Signed)
Patient ID: [name], male   DOB: 01/11/1934, 80 y.o.   MRN: [id number]    [address], [address] [place], [place]  [address] Phone: [phone] Fax:  [phone]  Date:  03/22/2014   ID:  DJAY MACNEAL, DOB [birth year], MRN [id number]  PCP:  Gennette Pac, MD   ASSESSMENT:  1. Coronary artery disease stable without angina 2. Hypertension, under control 3. Chronic kidney disease, stage III-stage IV, with volume retention 4. History of atrial fibrillation but no clinical recurrences  PLAN:  1. Probably needs more aggressive fluid removal total referred to nephrology 2. No clinical changes and cardiovascular care 3. One-year followup   SUBJECTIVE: [name] is a 80 y.o. male who denies angina. There's been some lower extremity edema. Mild orthopnea. He denies exertional complaints but does have dyspnea. He has not had syncope or palpitations. No transient neurological symptoms.   Wt Readings from Last 3 Encounters:  03/22/14 259 lb (117.482 kg)  11/02/13 258 lb 1.6 oz (117.073 kg)  03/04/12 250 lb (113.399 kg)     Past Medical History  Diagnosis Date  . Toxic goiter   . Hypertension   . Hyperlipidemia   . Diabetes mellitus without complication     Current Outpatient Prescriptions  Medication Sig Dispense Refill  . aspirin 325 MG tablet Take 325 mg by mouth daily.        Marland Kitchen atorvastatin (LIPITOR) 40 MG tablet Take 40 mg by mouth daily.        . calcitRIOL (ROCALTROL) 0.25 MCG capsule Take 0.25 mcg by mouth daily.        Marland Kitchen doxepin (SINEQUAN) 25 MG capsule Take 25 mg by mouth at bedtime.        . furosemide (LASIX) 80 MG tablet Take 80 mg by mouth 2 (two) times daily.        . insulin aspart protamine-insulin aspart (NOVOLOG 70/30) (70-30) 100 UNIT/ML injection Inject 60 Units into the skin daily with breakfast.       . insulin aspart protamine-insulin aspart (NOVOLOG 70/30) (70-30) 100 UNIT/ML injection Inject 40 Units into the skin daily with supper.          Marland Kitchen lisinopril (PRINIVIL,ZESTRIL) 10 MG tablet Take 10 mg by mouth daily.        . metoprolol succinate (TOPROL-XL) 25 MG 24 hr tablet Take 1 tablet (25 mg total) by mouth daily.  30 tablet  5  . niacin (NIASPAN) 500 MG CR tablet Take 500 mg by mouth 4 (four) times daily.         No current facility-administered medications for this visit.    Allergies:   No Known Allergies  Social History:  The patient  reports that he has quit smoking. He has never used smokeless tobacco. He reports that he does not drink alcohol.   ROS:  Please see the history of present illness.   All other systems reviewed and negative.   OBJECTIVE: VS:  BP 130/74  Pulse 60  Ht 5\' 10"  (1.778 m)  Wt 259 lb (117.482 kg)  BMI 37.16 kg/m2 Well nourished, well developed, in no acute distress, obese, elderly HEENT: normal Neck: JVD moderate elevation while flat at 45. Carotid bruit absent  Cardiac:  normal S1, S2; RRR; no murmur Lungs:  clear to auscultation bilaterally, no wheezing, rhonchi or rales Abd: soft, nontender, no hepatomegaly Ext: Edema 2+ bilateral ankles to mid shin. Pulses 2+ bilateral Skin: warm and dry Neuro:  CNs  2-12 intact, no focal abnormalities noted  EKG:  Normal sinus rhythm with nonspecific ST abnormality       Signed, Illene Labrador III, MD 03/22/2014 12:25 PM

## 2014-03-28 DIAGNOSIS — D649 Anemia, unspecified: Secondary | ICD-10-CM | POA: Diagnosis not present

## 2014-03-28 DIAGNOSIS — I129 Hypertensive chronic kidney disease with stage 1 through stage 4 chronic kidney disease, or unspecified chronic kidney disease: Secondary | ICD-10-CM | POA: Diagnosis not present

## 2014-03-28 DIAGNOSIS — N184 Chronic kidney disease, stage 4 (severe): Secondary | ICD-10-CM | POA: Diagnosis not present

## 2014-03-28 DIAGNOSIS — E669 Obesity, unspecified: Secondary | ICD-10-CM | POA: Diagnosis not present

## 2014-04-11 DIAGNOSIS — I129 Hypertensive chronic kidney disease with stage 1 through stage 4 chronic kidney disease, or unspecified chronic kidney disease: Secondary | ICD-10-CM | POA: Diagnosis not present

## 2014-04-12 DIAGNOSIS — E890 Postprocedural hypothyroidism: Secondary | ICD-10-CM | POA: Diagnosis not present

## 2014-04-12 DIAGNOSIS — IMO0001 Reserved for inherently not codable concepts without codable children: Secondary | ICD-10-CM | POA: Diagnosis not present

## 2014-04-12 DIAGNOSIS — N184 Chronic kidney disease, stage 4 (severe): Secondary | ICD-10-CM | POA: Diagnosis not present

## 2014-04-12 DIAGNOSIS — E039 Hypothyroidism, unspecified: Secondary | ICD-10-CM | POA: Diagnosis not present

## 2014-04-12 DIAGNOSIS — I10 Essential (primary) hypertension: Secondary | ICD-10-CM | POA: Diagnosis not present

## 2014-04-19 ENCOUNTER — Encounter: Payer: Self-pay | Admitting: Vascular Surgery

## 2014-04-20 ENCOUNTER — Ambulatory Visit (HOSPITAL_COMMUNITY)
Admission: RE | Admit: 2014-04-20 | Discharge: 2014-04-20 | Disposition: A | Discharge status: Home / Self Care | Payer: Medicare Other | Source: Ambulatory Visit | Attending: Vascular Surgery | Admitting: Vascular Surgery

## 2014-04-20 ENCOUNTER — Encounter: Payer: Self-pay | Admitting: Vascular Surgery

## 2014-04-20 ENCOUNTER — Ambulatory Visit (INDEPENDENT_AMBULATORY_CARE_PROVIDER_SITE_OTHER): Payer: Medicare Other | Admitting: Vascular Surgery

## 2014-04-20 VITALS — BP 126/53 | HR 60 | Ht 70.0 in | Wt 254.0 lb

## 2014-04-20 DIAGNOSIS — Z48812 Encounter for surgical aftercare following surgery on the circulatory system: Secondary | ICD-10-CM

## 2014-04-20 DIAGNOSIS — I714 Abdominal aortic aneurysm, without rupture, unspecified: Secondary | ICD-10-CM

## 2014-04-20 NOTE — Progress Notes (Signed)
Vascular and Vein Specialist of [facility]  Patient name: [name] MRN: [id number] DOB: 1934-01-07 Sex: male  REASON FOR VISIT: Follow up after EVAR  HPI: [name] is a 80 y.o. male who underwent an endovascular aneurysm repair by Dr. Drucie Opitz 7 years ago. He was last seen in our office by our nurse practitioner on 03/04/2012. His aneurysm had continued gradually decreased in size and was 3.7 cm at that time.  Since we saw him last, he denies any abdominal pain or back pain. They have been no significant changes in his medical history. He is not a smoker. He is on aspirin. He is on a statin.  Past Medical History  Diagnosis Date  . Toxic goiter   . Hypertension   . Hyperlipidemia   . Diabetes mellitus without complication    Family History  Problem Relation Age of Onset  . Stroke Mother   . Stroke Father   . Diabetes Sister   . Diabetes Brother    SOCIAL HISTORY: History  Substance Use Topics  . Smoking status: Former Research scientist (life sciences)  . Smokeless tobacco: Never Used  . Alcohol Use: No   No Known Allergies Current Outpatient Prescriptions  Medication Sig Dispense Refill  . aspirin 325 MG tablet Take 325 mg by mouth daily.        Marland Kitchen atorvastatin (LIPITOR) 40 MG tablet Take 40 mg by mouth daily.        . calcitRIOL (ROCALTROL) 0.25 MCG capsule Take 0.25 mcg by mouth daily.        Marland Kitchen doxepin (SINEQUAN) 25 MG capsule Take 25 mg by mouth at bedtime.        . furosemide (LASIX) 80 MG tablet Take 80 mg by mouth 2 (two) times daily.        . insulin aspart protamine-insulin aspart (NOVOLOG 70/30) (70-30) 100 UNIT/ML injection Inject 60 Units into the skin daily with breakfast.       . insulin aspart protamine-insulin aspart (NOVOLOG 70/30) (70-30) 100 UNIT/ML injection Inject 40 Units into the skin daily with supper.        . metoprolol succinate (TOPROL-XL) 25 MG 24 hr tablet Take 1 tablet (25 mg total) by mouth daily.  30 tablet  5  . niacin (NIASPAN) 500 MG CR tablet Take 500 mg  by mouth 4 (four) times daily.        Marland Kitchen lisinopril (PRINIVIL,ZESTRIL) 10 MG tablet Take 10 mg by mouth daily.         No current facility-administered medications for this visit.   REVIEW OF SYSTEMS: Valu.Nieves ] denotes positive finding; [  ] denotes negative finding  CARDIOVASCULAR:  [ ]  chest pain   [ ]  chest pressure   [ ]  palpitations   [ ]  orthopnea   [ ]  dyspnea on exertion   [ ]  claudication   [ ]  rest pain   [ ]  DVT   [ ]  phlebitis PULMONARY:   [ ]  productive cough   [ ]  asthma   [ ]  wheezing NEUROLOGIC:   [ ]  weakness  [ ]  paresthesias  [ ]  aphasia  [ ]  amaurosis  [ ]  dizziness HEMATOLOGIC:   [ ]  bleeding problems   [ ]  clotting disorders MUSCULOSKELETAL:  [ ]  joint pain   [ ]  joint swelling [ ]  leg swelling GASTROINTESTINAL: [ ]   blood in stool  [ ]   hematemesis GENITOURINARY:  [ ]   dysuria  [ ]   hematuria PSYCHIATRIC:  [ ]   history of major depression INTEGUMENTARY:  [ ]  rashes  [ ]  ulcers CONSTITUTIONAL:  [ ]  fever   [ ]  chills  PHYSICAL EXAM: Filed Vitals:   04/20/14 0947  BP: 126/53  Pulse: 60  Height: 5\' 10"  (1.778 m)  Weight: 254 lb (115.214 kg)  SpO2: 97%   Body mass index is 36.45 kg/(m^2). GENERAL: The patient is a well-nourished male, in no acute distress. The vital signs are documented above. CARDIOVASCULAR: There is a regular rate and rhythm. I do not detect carotid bruits. He has palpable femoral pulses. PULMONARY: There is good air exchange bilaterally without wheezing or rales. ABDOMEN: His abdomen is obese and difficult to assess. MUSCULOSKELETAL: There are no major deformities or cyanosis. NEUROLOGIC: No focal weakness or paresthesias are detected. SKIN: There are no ulcers or rashes noted. PSYCHIATRIC: The patient has a normal affect.  DATA:  I have independently interpreted his duplex today which shows that the maximum diameter of his aneurysm is 3.5 cm. However, because of his obesity, the exam was a challenging and suboptimal.  His last CT of the abdomen  and pelvis was in March of 2012. At that time the aortic stent graft was in good position without evidence of endoleak. He was 4.2 cm in maximum diameter.  MEDICAL ISSUES:  Abdominal aneurysm without mention of rupture The patient is status post EVAR 7 years ago by Dr. Amedeo Plenty. Duplex today suggested the aneurysm continues to gradually decreased in size although because of his body habitus this study is suboptimal. Normally the patient without evidence of endoleak or other complication we would get a CT scan at 5 year intervals, however, given his duplex is been challenging I have recommended that we obtain a CT of the abdomen and pelvis in 1 year. We'll see him back in one year. He knows to call sooner if he has problems.   Return in about 1 year (around 04/21/2015).  Angelia Mould [facility] [place]: [phone]

## 2014-04-20 NOTE — Assessment & Plan Note (Signed)
The patient is status post EVAR 7 years ago by Dr. Amedeo Plenty. Duplex today suggested the aneurysm continues to gradually decreased in size although because of his body habitus this study is suboptimal. Normally the patient without evidence of endoleak or other complication we would get a CT scan at 5 year intervals, however, given his duplex is been challenging I have recommended that we obtain a CT of the abdomen and pelvis in 1 year. We'll see him back in one year. He knows to call sooner if he has problems.

## 2014-04-20 NOTE — Addendum Note (Signed)
Addended by: Mena Goes on: 04/20/2014 10:53 AM   Modules accepted: Orders

## 2014-04-28 DIAGNOSIS — I10 Essential (primary) hypertension: Secondary | ICD-10-CM | POA: Diagnosis not present

## 2014-04-28 DIAGNOSIS — Z23 Encounter for immunization: Secondary | ICD-10-CM | POA: Diagnosis not present

## 2014-04-28 DIAGNOSIS — N183 Chronic kidney disease, stage 3 unspecified: Secondary | ICD-10-CM | POA: Diagnosis not present

## 2014-04-28 DIAGNOSIS — Z125 Encounter for screening for malignant neoplasm of prostate: Secondary | ICD-10-CM | POA: Diagnosis not present

## 2014-04-28 DIAGNOSIS — Z0000 Encounter for general adult medical examination without abnormal findings: Secondary | ICD-10-CM | POA: Diagnosis not present

## 2014-04-28 DIAGNOSIS — E7800 Pure hypercholesterolemia, unspecified: Secondary | ICD-10-CM | POA: Diagnosis not present

## 2014-04-28 DIAGNOSIS — Z1331 Encounter for screening for depression: Secondary | ICD-10-CM | POA: Diagnosis not present

## 2014-04-28 DIAGNOSIS — I2510 Atherosclerotic heart disease of native coronary artery without angina pectoris: Secondary | ICD-10-CM | POA: Diagnosis not present

## 2014-04-28 DIAGNOSIS — E1129 Type 2 diabetes mellitus with other diabetic kidney complication: Secondary | ICD-10-CM | POA: Diagnosis not present

## 2014-04-28 DIAGNOSIS — E042 Nontoxic multinodular goiter: Secondary | ICD-10-CM | POA: Diagnosis not present

## 2014-04-28 DIAGNOSIS — D696 Thrombocytopenia, unspecified: Secondary | ICD-10-CM | POA: Diagnosis not present

## 2014-05-03 DIAGNOSIS — L608 Other nail disorders: Secondary | ICD-10-CM | POA: Diagnosis not present

## 2014-05-03 DIAGNOSIS — E119 Type 2 diabetes mellitus without complications: Secondary | ICD-10-CM | POA: Diagnosis not present

## 2014-08-09 DIAGNOSIS — Z23 Encounter for immunization: Secondary | ICD-10-CM | POA: Diagnosis not present

## 2014-08-09 DIAGNOSIS — D649 Anemia, unspecified: Secondary | ICD-10-CM | POA: Diagnosis not present

## 2014-08-09 DIAGNOSIS — N184 Chronic kidney disease, stage 4 (severe): Secondary | ICD-10-CM | POA: Diagnosis not present

## 2014-08-09 DIAGNOSIS — N2581 Secondary hyperparathyroidism of renal origin: Secondary | ICD-10-CM | POA: Diagnosis not present

## 2014-08-09 DIAGNOSIS — I129 Hypertensive chronic kidney disease with stage 1 through stage 4 chronic kidney disease, or unspecified chronic kidney disease: Secondary | ICD-10-CM | POA: Diagnosis not present

## 2014-08-17 DIAGNOSIS — E876 Hypokalemia: Secondary | ICD-10-CM | POA: Diagnosis not present

## 2014-09-09 DIAGNOSIS — H2513 Age-related nuclear cataract, bilateral: Secondary | ICD-10-CM | POA: Diagnosis not present

## 2014-09-09 DIAGNOSIS — E119 Type 2 diabetes mellitus without complications: Secondary | ICD-10-CM | POA: Diagnosis not present

## 2014-09-20 DIAGNOSIS — L84 Corns and callosities: Secondary | ICD-10-CM | POA: Diagnosis not present

## 2014-09-20 DIAGNOSIS — L602 Onychogryphosis: Secondary | ICD-10-CM | POA: Diagnosis not present

## 2014-09-20 DIAGNOSIS — E1151 Type 2 diabetes mellitus with diabetic peripheral angiopathy without gangrene: Secondary | ICD-10-CM | POA: Diagnosis not present

## 2014-10-28 DIAGNOSIS — E890 Postprocedural hypothyroidism: Secondary | ICD-10-CM | POA: Diagnosis not present

## 2014-10-28 DIAGNOSIS — E1165 Type 2 diabetes mellitus with hyperglycemia: Secondary | ICD-10-CM | POA: Diagnosis not present

## 2014-10-28 DIAGNOSIS — E780 Pure hypercholesterolemia: Secondary | ICD-10-CM | POA: Diagnosis not present

## 2014-10-28 DIAGNOSIS — I10 Essential (primary) hypertension: Secondary | ICD-10-CM | POA: Diagnosis not present

## 2014-11-09 ENCOUNTER — Other Ambulatory Visit: Payer: Self-pay | Admitting: Interventional Cardiology

## 2014-11-10 ENCOUNTER — Other Ambulatory Visit: Payer: Self-pay | Admitting: *Deleted

## 2014-11-10 MED ORDER — METOPROLOL SUCCINATE ER 25 MG PO TB24: 25.0000 mg | ORAL_TABLET | Freq: Every day | ORAL | Status: DC

## 2015-03-17 ENCOUNTER — Other Ambulatory Visit: Payer: Self-pay | Admitting: *Deleted

## 2015-03-17 MED ORDER — METOPROLOL SUCCINATE ER 25 MG PO TB24: 25.0000 mg | ORAL_TABLET | Freq: Every day | ORAL | Status: DC

## 2015-03-23 ENCOUNTER — Encounter: Payer: Self-pay | Admitting: Interventional Cardiology

## 2015-03-23 ENCOUNTER — Ambulatory Visit (INDEPENDENT_AMBULATORY_CARE_PROVIDER_SITE_OTHER): Payer: PPO | Admitting: Interventional Cardiology

## 2015-03-23 VITALS — BP 140/70 | HR 71 | Ht 70.0 in | Wt 259.8 lb

## 2015-03-23 DIAGNOSIS — I10 Essential (primary) hypertension: Secondary | ICD-10-CM

## 2015-03-23 DIAGNOSIS — E0821 Diabetes mellitus due to underlying condition with diabetic nephropathy: Secondary | ICD-10-CM

## 2015-03-23 DIAGNOSIS — I714 Abdominal aortic aneurysm, without rupture, unspecified: Secondary | ICD-10-CM

## 2015-03-23 DIAGNOSIS — I25810 Atherosclerosis of coronary artery bypass graft(s) without angina pectoris: Secondary | ICD-10-CM | POA: Diagnosis not present

## 2015-03-23 DIAGNOSIS — E785 Hyperlipidemia, unspecified: Secondary | ICD-10-CM | POA: Diagnosis not present

## 2015-03-23 DIAGNOSIS — N184 Chronic kidney disease, stage 4 (severe): Secondary | ICD-10-CM

## 2015-03-23 MED ORDER — METOPROLOL SUCCINATE ER 25 MG PO TB24: 25.0000 mg | ORAL_TABLET | Freq: Every day | ORAL | Status: DC

## 2015-03-23 NOTE — Patient Instructions (Signed)
Your physician recommends that you continue on your current medications as directed. Please refer to the Current Medication list given to you today.  Stay Active  Your physician wants you to follow-up in: 1 year with Dr.Smith You will receive a reminder letter in the mail two months in advance. If you don't receive a letter, please call our office to schedule the follow-up appointment.

## 2015-03-23 NOTE — Addendum Note (Signed)
Addended by: Daneen Schick on: 03/23/2015 05:44 PM   Modules accepted: Level of Service

## 2015-03-23 NOTE — Progress Notes (Addendum)
Cardiology Office Note   Date:  03/23/2015   ID:  [name], DOB 05/21/1934, MRN [id number]  PCP:  Roy Pac, MD  Cardiologist:   Roy Grooms, MD   Chief Complaint  Patient presents with  . Coronary Artery Disease      History of Present Illness: [name] is a 81 y.o. male who presents for coronary artery disease with prior CABG 2006, hypertension, hyperlipidemia, and known abdominal aortic aneurysm treated with EVAR.  He is doing well. Denies orthopnea, PND, chest pain, but continues to have ankle edema. He is followed closely by [name] and tells me that he has been informed that kidney function is 25% of normal. No nitroglycerin use. He acknowledges that coronary bypass grafting was 10 years ago. He denies claudication. He is relatively inactive.    Past Medical History  Diagnosis Date  . Toxic goiter   . Hypertension   . Hyperlipidemia   . Diabetes mellitus without complication     Past Surgical History  Procedure Laterality Date  . Abdominal aortic aneurysm repair    . Cardiac bypass       Current Outpatient Prescriptions  Medication Sig Dispense Refill  . niacin (NIASPAN) 1000 MG CR tablet Take 1,000 mg by mouth 2 (two) times daily.    Marland Kitchen aspirin 325 MG tablet Take 325 mg by mouth daily.      Marland Kitchen atorvastatin (LIPITOR) 40 MG tablet Take 40 mg by mouth daily.      . calcitRIOL (ROCALTROL) 0.25 MCG capsule Take 0.25 mcg by mouth daily.      Marland Kitchen doxepin (SINEQUAN) 25 MG capsule Take 25 mg by mouth at bedtime.      . furosemide (LASIX) 80 MG tablet Take 80 mg by mouth 4 (four) times daily.     . insulin aspart protamine-insulin aspart (NOVOLOG 70/30) (70-30) 100 UNIT/ML injection Inject 60 Units into the skin daily with breakfast.     . insulin aspart protamine-insulin aspart (NOVOLOG 70/30) (70-30) 100 UNIT/ML injection Inject 40 Units into the skin daily with supper.      Marland Kitchen lisinopril (PRINIVIL,ZESTRIL) 10 MG tablet Take 10 mg by mouth  daily.      . metoprolol succinate (TOPROL-XL) 25 MG 24 hr tablet Take 1 tablet (25 mg total) by mouth daily. 90 tablet 0   No current facility-administered medications for this visit.    Allergies:   Review of patient's allergies indicates no known allergies.    Social History:  The patient  reports that he has quit smoking. He has never used smokeless tobacco. He reports that he does not drink alcohol.   Family History:  [name] includes Diabetes in his brother and sister; Stroke in his father and mother.    ROS:  Please see the history of present illness.   Otherwise, review of systems are positive for lower extremity swelling. He denies orthopnea however..   All other systems are reviewed and negative.    PHYSICAL EXAM: VS:  BP 140/70 mmHg  Pulse 71  Ht 5\' 10"  (1.778 m)  Wt 259 lb 12.8 oz (117.845 kg)  BMI 37.28 kg/m2 , BMI Body mass index is 37.28 kg/(m^2). GEN: Well nourished, well developed, in no acute distress HEENT: normal Neck: no JVD, carotid bruits, or masses Cardiac: RRR; no murmurs, rubs, or gallops,no edema  Respiratory:  clear to auscultation bilaterally, normal work of breathing GI: soft, nontender, nondistended, + BS MS: no deformity or  atrophy Skin: warm and dry, no rash Neuro:  Strength and sensation are intact Psych: euthymic mood, full affect   EKG:  EKG is ordered today. The ekg ordered today demonstrates normal sinus rhythm with nonspecific T-wave flattening and poor R-wave progression.   Recent Labs: No results found for requested labs within last 365 days.    Lipid Panel No results found for: CHOL, TRIG, HDL, CHOLHDL, VLDL, LDLCALC, LDLDIRECT    Wt Readings from Last 3 Encounters:  03/23/15 259 lb 12.8 oz (117.845 kg)  04/20/14 254 lb (115.214 kg)  03/22/14 259 lb (117.482 kg)      Other studies Reviewed: Additional studies/ records that were reviewed today include: Review of vascular surgery office visit and  nephrology follow-up notes.. Review of the above records demonstrates: Abdominal aortic aneurysm treated with endovascular therapy remains stable. Kidney function is slowly deteriorating.   ASSESSMENT AND PLAN:  Coronary artery disease involving coronary bypass graft of native heart without angina pectoris - has angina. EKG is unchanged.  Essential hypertension - upper limit of normal in range.  Hyperlipidemia: Followed by primary care  Diabetes mellitus due to underlying condition with diabetic nephropathy  Chronic Kidney disease, stage IV: Followed by Dr. data dating at [facility] BUN 30, creatinine 2.1 on laboratory data 03/13/2015  Abdominal aortic aneurysm: Stable after EVAR 2009.     Current medicines are reviewed at length with the patient today.  The patient does not have concerns regarding medicines.  The following changes have been made:  no change  Labs/ tests ordered today include: We desperately needs laboratory data updated. These are being followed closely by nephrology. We will try to scan and copies of recent laboratory data.  No orders of the defined types were placed in this encounter.     Disposition:   FU with [name] in one year    Signed, Roy Grooms, MD  03/23/2015 9:06 AM    [address], [place], [place]  [address] Phone: [phone]; Fax: [phone]

## 2015-04-24 ENCOUNTER — Encounter: Payer: Self-pay | Admitting: Vascular Surgery

## 2015-04-26 ENCOUNTER — Ambulatory Visit
Admission: RE | Admit: 2015-04-26 | Discharge: 2015-04-26 | Disposition: A | Discharge status: Home / Self Care | Payer: PPO | Source: Ambulatory Visit | Attending: Vascular Surgery | Admitting: Vascular Surgery

## 2015-04-26 ENCOUNTER — Other Ambulatory Visit: Payer: Self-pay | Admitting: Vascular Surgery

## 2015-04-26 ENCOUNTER — Ambulatory Visit: Payer: Medicare Other | Admitting: Vascular Surgery

## 2015-04-26 DIAGNOSIS — I714 Abdominal aortic aneurysm, without rupture, unspecified: Secondary | ICD-10-CM

## 2015-04-26 DIAGNOSIS — Z48812 Encounter for surgical aftercare following surgery on the circulatory system: Secondary | ICD-10-CM

## 2015-04-26 LAB — BUN: BUN: 38 mg/dL — ABNORMAL HIGH (ref 6–23)

## 2015-04-26 LAB — CREATININE, SERUM: Creat: 2.1 mg/dL — ABNORMAL HIGH (ref 0.50–1.35)

## 2015-05-11 ENCOUNTER — Other Ambulatory Visit: Payer: Self-pay | Admitting: Vascular Surgery

## 2015-05-11 LAB — BUN: BUN: 29 mg/dL — ABNORMAL HIGH (ref 6–23)

## 2015-05-11 LAB — CREATININE, SERUM: Creat: 2.01 mg/dL — ABNORMAL HIGH (ref 0.50–1.35)

## 2015-05-17 ENCOUNTER — Ambulatory Visit
Admission: RE | Admit: 2015-05-17 | Discharge: 2015-05-17 | Disposition: A | Discharge status: Home / Self Care | Payer: PPO | Source: Ambulatory Visit | Attending: Vascular Surgery | Admitting: Vascular Surgery

## 2015-05-17 MED ORDER — IOPAMIDOL (ISOVUE-300) INJECTION 61%
60.0000 mL | Freq: Once | INTRAVENOUS | Status: AC | PRN
  Administered 2015-05-17: 60 mL via INTRAVENOUS

## 2015-05-22 ENCOUNTER — Encounter: Payer: Self-pay | Admitting: Vascular Surgery

## 2015-05-24 ENCOUNTER — Ambulatory Visit (INDEPENDENT_AMBULATORY_CARE_PROVIDER_SITE_OTHER): Payer: PPO | Admitting: Vascular Surgery

## 2015-05-24 ENCOUNTER — Encounter: Payer: Self-pay | Admitting: Vascular Surgery

## 2015-05-24 VITALS — BP 137/58 | HR 77 | Ht 70.0 in | Wt 260.2 lb

## 2015-05-24 DIAGNOSIS — Z48812 Encounter for surgical aftercare following surgery on the circulatory system: Secondary | ICD-10-CM

## 2015-05-24 DIAGNOSIS — I714 Abdominal aortic aneurysm, without rupture, unspecified: Secondary | ICD-10-CM

## 2015-05-24 NOTE — Addendum Note (Signed)
Addended by: Dorthula Rue L on: 05/24/2015 03:34 PM   Modules accepted: Orders

## 2015-05-24 NOTE — Progress Notes (Signed)
Vascular and Vein Specialist of [facility]  Patient name: [name] MRN: [id number] DOB: 23-May-1934 Sex: male  REASON FOR VISIT: Follow up after EVAR.  HPI: [name] is a 81 y.o. male who I last saw on 04/20/2014. He underwent endovascular aneurysm repair by Dr. Drucie Opitz 8 years ago. The duplex scan at the time of his last follow up visit suggested that the aneurysm had continued to gradually decrease in size although because of his body habitus the study was suboptimal. Given that the duplex was challenging in that we would normally obtain a CT scan at 5 year intervals I set him up for a 1 year follow up visit and CT scan. He comes in today after having had his CT scan.  He denies any abdominal pain or back pain. Overall he's been doing quite well.  Past Medical History  Diagnosis Date  . Toxic goiter   . Hypertension   . Hyperlipidemia   . Diabetes mellitus without complication    Family History  Problem Relation Age of Onset  . Stroke Mother   . Stroke Father   . Diabetes Sister   . Diabetes Brother    SOCIAL HISTORY: History  Substance Use Topics  . Smoking status: Former Research scientist (life sciences)  . Smokeless tobacco: Never Used  . Alcohol Use: No   No Known Allergies Current Outpatient Prescriptions  Medication Sig Dispense Refill  . aspirin 325 MG tablet Take 325 mg by mouth daily.      Marland Kitchen atorvastatin (LIPITOR) 40 MG tablet Take 40 mg by mouth daily.      . calcitRIOL (ROCALTROL) 0.25 MCG capsule Take 0.25 mcg by mouth daily.      Marland Kitchen doxepin (SINEQUAN) 25 MG capsule Take 25 mg by mouth at bedtime.      . furosemide (LASIX) 80 MG tablet Take 160 mg by mouth 2 (two) times daily.    . insulin aspart protamine-insulin aspart (NOVOLOG 70/30) (70-30) 100 UNIT/ML injection Inject 60 Units into the skin daily with breakfast.     . insulin aspart protamine-insulin aspart (NOVOLOG 70/30) (70-30) 100 UNIT/ML injection Inject 40 Units into the skin daily with supper.      Marland Kitchen lisinopril  (PRINIVIL,ZESTRIL) 10 MG tablet Take 10 mg by mouth daily.      . metoprolol succinate (TOPROL-XL) 25 MG 24 hr tablet Take 1 tablet (25 mg total) by mouth daily. 90 tablet 3  . niacin (NIASPAN) 1000 MG CR tablet Take 1,000 mg by mouth 2 (two) times daily.     No current facility-administered medications for this visit.   REVIEW OF SYSTEMS: Valu.Nieves ] denotes positive finding; [  ] denotes negative finding  CARDIOVASCULAR:  [ ]  chest pain   [ ]  chest pressure   [ ]  palpitations   [ ]  orthopnea   [ ]  dyspnea on exertion   [ ]  claudication   [ ]  rest pain   [ ]  DVT   [ ]  phlebitis PULMONARY:   [ ]  productive cough   [ ]  asthma   [ ]  wheezing NEUROLOGIC:   [ ]  weakness  [ ]  paresthesias  [ ]  aphasia  [ ]  amaurosis  [ ]  dizziness HEMATOLOGIC:   [ ]  bleeding problems   [ ]  clotting disorders MUSCULOSKELETAL:  [ ]  joint pain   [ ]  joint swelling [ ]  leg swelling GASTROINTESTINAL: [ ]   blood in stool  [ ]   hematemesis GENITOURINARY:  [ ]   dysuria  [ ]   hematuria PSYCHIATRIC:  [ ]  history of major depression INTEGUMENTARY:  [ ]  rashes  [ ]  ulcers CONSTITUTIONAL:  [ ]  fever   [ ]  chills  PHYSICAL EXAM: There were no vitals filed for this visit. GENERAL: The patient is a well-nourished male, in no acute distress. The vital signs are documented above. CARDIOVASCULAR: There is a regular rate and rhythm. No carotid bruits. Both feet are warm and well perfused. He does have some mild bilateral lower extremity swelling. He does have palpable femoral pulses. PULMONARY: There is good air exchange bilaterally without wheezing or rales. ABDOMEN: Soft and non-tender with normal pitched bowel sounds. It is difficult to evaluate his aneurysm because of his body habitus. MUSCULOSKELETAL: There are no major deformities or cyanosis. NEUROLOGIC: No focal weakness or paresthesias are detected. SKIN: There are no ulcers or rashes noted. PSYCHIATRIC: The patient has a normal affect.  DATA:  I reviewed the images of his  CT scan. The maximum diameter of the aneurysm is now 3.2 cm and thus the aneurysm continues to shrink in size. There is no evidence of endoleak on his CT scan. This scan was performed on 05/17/2015.  MEDICAL ISSUES:  STATUS POST ENDOVASCULAR REPAIR OF ABDOMINAL AORTIC ANEURYSM: the aneurysm continues to shrink in size and is now 3.2 cm in maximum diameter. I recommended a follow up ultrasound in 2 years. We will see him back at that time. He knows to call sooner if he has problems.    Deitra [facility] [place]: [phone]

## 2015-06-05 ENCOUNTER — Other Ambulatory Visit: Payer: Self-pay

## 2015-07-03 ENCOUNTER — Other Ambulatory Visit: Payer: Self-pay

## 2015-07-03 MED ORDER — METOPROLOL SUCCINATE ER 25 MG PO TB24: 25.0000 mg | ORAL_TABLET | Freq: Every day | ORAL | Status: DC

## 2015-10-17 ENCOUNTER — Ambulatory Visit: Payer: PPO | Admitting: Podiatry

## 2015-11-15 ENCOUNTER — Encounter: Payer: Self-pay | Admitting: Podiatry

## 2015-11-15 ENCOUNTER — Ambulatory Visit (INDEPENDENT_AMBULATORY_CARE_PROVIDER_SITE_OTHER): Payer: PPO | Admitting: Podiatry

## 2015-11-15 DIAGNOSIS — M79674 Pain in right toe(s): Secondary | ICD-10-CM

## 2015-11-15 DIAGNOSIS — E1142 Type 2 diabetes mellitus with diabetic polyneuropathy: Secondary | ICD-10-CM | POA: Diagnosis not present

## 2015-11-15 DIAGNOSIS — M79675 Pain in left toe(s): Secondary | ICD-10-CM | POA: Diagnosis not present

## 2015-11-15 DIAGNOSIS — B351 Tinea unguium: Secondary | ICD-10-CM | POA: Diagnosis not present

## 2015-11-15 NOTE — Patient Instructions (Signed)
Today her diabetic foot examination demonstrated the pulse on the inside ankle adequate which carries approximately two thirds of your blood flow to your feet You have decreased feeling in your feet most likely associated with diabetic peripheral neuropathy The toenails are thickened deformities associated with fungal infection Today I recommended to return every 3 months for nail debridement or sooner if you have any other particular concerns  Diabetes and Foot Care Diabetes may cause you to have problems because of poor blood supply (circulation) to your feet and legs. This may cause the skin on your feet to become thinner, break easier, and heal more slowly. Your skin may become dry, and the skin may peel and crack. You may also have nerve damage in your legs and feet causing decreased feeling in them. You may not notice minor injuries to your feet that could lead to infections or more serious problems. Taking care of your feet is one of the most important things you can do for yourself.  HOME CARE INSTRUCTIONS  Wear shoes at all times, even in the house. Do not go barefoot. Bare feet are easily injured.  Check your feet daily for blisters, cuts, and redness. If you cannot see the bottom of your feet, use a mirror or ask someone for help.  Wash your feet with warm water (do not use hot water) and mild soap. Then pat your feet and the areas between your toes until they are completely dry. Do not soak your feet as this can dry your skin.  Apply a moisturizing lotion or petroleum jelly (that does not contain alcohol and is unscented) to the skin on your feet and to dry, brittle toenails. Do not apply lotion between your toes.  Trim your toenails straight across. Do not dig under them or around the cuticle. File the edges of your nails with an emery board or nail file.  Do not cut corns or calluses or try to remove them with medicine.  Wear clean socks or stockings every day. Make sure they are  not too tight. Do not wear knee-high stockings since they may decrease blood flow to your legs.  Wear shoes that fit properly and have enough cushioning. To break in new shoes, wear them for just a few hours a day. This prevents you from injuring your feet. Always look in your shoes before you put them on to be sure there are no objects inside.  Do not cross your legs. This may decrease the blood flow to your feet.  If you find a minor scrape, cut, or break in the skin on your feet, keep it and the skin around it clean and dry. These areas may be cleansed with mild soap and water. Do not cleanse the area with peroxide, alcohol, or iodine.  When you remove an adhesive bandage, be sure not to damage the skin around it.  If you have a wound, look at it several times a day to make sure it is healing.  Do not use heating pads or hot water bottles. They may burn your skin. If you have lost feeling in your feet or legs, you may not know it is happening until it is too late.  Make sure your health care provider performs a complete foot exam at least annually or more often if you have foot problems. Report any cuts, sores, or bruises to your health care provider immediately. SEEK MEDICAL CARE IF:   You have an injury that is not healing.  You have cuts or breaks in the skin.  You have an ingrown nail.  You notice redness on your legs or feet.  You feel burning or tingling in your legs or feet.  You have pain or cramps in your legs and feet.  Your legs or feet are numb.  Your feet always feel cold. SEEK IMMEDIATE MEDICAL CARE IF:   There is increasing redness, swelling, or pain in or around a wound.  There is a red line that goes up your leg.  Pus is coming from a wound.  You develop a fever or as directed by your health care provider.  You notice a bad smell coming from an ulcer or wound.   This information is not intended to replace advice given to you by your health care  provider. Make sure you discuss any questions you have with your health care provider.   Document Released: 11/22/2000 Document Revised: 07/28/2013 Document Reviewed: 05/04/2013 Elsevier Interactive Patient Education [employer].

## 2015-11-15 NOTE — Progress Notes (Signed)
   Subjective:    Patient ID: [name], male    DOB: Jul 27, 1934, 81 y.o.   MRN: [id number]  HPI    This patient presents today complaining of long and thick toenails were charm cough walking wearing shoes and is requesting debridement of the toenails. He also is requesting a diabetic foot examination. Patient describes the toenails as a chronic problem and describes visiting podiatrist over a 5 year. At three-month intervals with the last visit for podiatrist earlier this year. He denies any history of foot ulceration, claudication or amputation  Review of Systems  Respiratory: Positive for cough.   Cardiovascular: Positive for leg swelling.  Endocrine: Positive for cold intolerance.  Genitourinary: Positive for frequency.  Musculoskeletal: Positive for joint swelling.  Skin: Positive for color change.  Psychiatric/Behavioral: Positive for confusion.       Objective:   Physical Exam  Orientated 3  Vascular: Peripheral pitting edema bilaterally DP 0/4 bilaterally PT pulses 2/4 bilaterally Capillary reflex immediate bilaterally  Neurological: Sensation to 10 g monofilament wire intact 2/5 right 1/5 left Vibratory sensation nonreactive bilaterally Ankle reflexes reactive bilaterally  Dermatological: No open skin lesions bilaterally Dry skin bilaterally The toenails are extremely elongated, hypertrophic, discolored, deformed and tender direct palpation 6-10  Musculoskeletal: No deformities noted bilaterally There is no crepitus or restriction range of motion ankle, subtalar, or midtarsal joints bilaterally      Assessment & Plan:   Assessment: Decreased DP pulses bilaterally without history of claudication or open wounds Diabetic peripheral neuropathy Neglected symptomatic onychomycoses 6-10  Plan: Today I reviewed the results of the examination with patient and made aware that he had decreased feeling in diminished pedal pulses without history of lesions. I  offered him debridement of the nails and he consented The toenails 6-10 were debrided mechanically an electrical leave without any bleeding  Reappoint 3 months

## 2016-01-02 DIAGNOSIS — I10 Essential (primary) hypertension: Secondary | ICD-10-CM | POA: Diagnosis not present

## 2016-01-02 DIAGNOSIS — E7800 Pure hypercholesterolemia, unspecified: Secondary | ICD-10-CM | POA: Diagnosis not present

## 2016-01-02 DIAGNOSIS — E1165 Type 2 diabetes mellitus with hyperglycemia: Secondary | ICD-10-CM | POA: Diagnosis not present

## 2016-01-02 DIAGNOSIS — E890 Postprocedural hypothyroidism: Secondary | ICD-10-CM | POA: Diagnosis not present

## 2016-01-29 DIAGNOSIS — D631 Anemia in chronic kidney disease: Secondary | ICD-10-CM | POA: Diagnosis not present

## 2016-01-29 DIAGNOSIS — N2581 Secondary hyperparathyroidism of renal origin: Secondary | ICD-10-CM | POA: Diagnosis not present

## 2016-01-29 DIAGNOSIS — N184 Chronic kidney disease, stage 4 (severe): Secondary | ICD-10-CM | POA: Diagnosis not present

## 2016-01-29 DIAGNOSIS — E8770 Fluid overload, unspecified: Secondary | ICD-10-CM | POA: Diagnosis not present

## 2016-01-29 DIAGNOSIS — I2510 Atherosclerotic heart disease of native coronary artery without angina pectoris: Secondary | ICD-10-CM | POA: Diagnosis not present

## 2016-01-29 DIAGNOSIS — E782 Mixed hyperlipidemia: Secondary | ICD-10-CM | POA: Diagnosis not present

## 2016-01-29 DIAGNOSIS — E118 Type 2 diabetes mellitus with unspecified complications: Secondary | ICD-10-CM | POA: Diagnosis not present

## 2016-01-29 DIAGNOSIS — I714 Abdominal aortic aneurysm, without rupture: Secondary | ICD-10-CM | POA: Diagnosis not present

## 2016-01-29 DIAGNOSIS — E1129 Type 2 diabetes mellitus with other diabetic kidney complication: Secondary | ICD-10-CM | POA: Diagnosis not present

## 2016-01-29 DIAGNOSIS — I4891 Unspecified atrial fibrillation: Secondary | ICD-10-CM | POA: Diagnosis not present

## 2016-01-29 DIAGNOSIS — I129 Hypertensive chronic kidney disease with stage 1 through stage 4 chronic kidney disease, or unspecified chronic kidney disease: Secondary | ICD-10-CM | POA: Diagnosis not present

## 2016-01-29 DIAGNOSIS — Z8679 Personal history of other diseases of the circulatory system: Secondary | ICD-10-CM | POA: Diagnosis not present

## 2016-02-21 ENCOUNTER — Encounter: Payer: Self-pay | Admitting: Podiatry

## 2016-02-21 ENCOUNTER — Ambulatory Visit (INDEPENDENT_AMBULATORY_CARE_PROVIDER_SITE_OTHER): Payer: PPO | Admitting: Podiatry

## 2016-02-21 DIAGNOSIS — M79675 Pain in left toe(s): Secondary | ICD-10-CM

## 2016-02-21 DIAGNOSIS — N184 Chronic kidney disease, stage 4 (severe): Secondary | ICD-10-CM | POA: Diagnosis not present

## 2016-02-21 DIAGNOSIS — M79674 Pain in right toe(s): Secondary | ICD-10-CM | POA: Diagnosis not present

## 2016-02-21 DIAGNOSIS — B351 Tinea unguium: Secondary | ICD-10-CM

## 2016-02-21 NOTE — Progress Notes (Signed)
Patient ID: [name], male   DOB: 19-Oct-1934, 82 y.o.   MRN: [id number]  Subjective: This patient presents today complaining of uncomfortable toenails and walking wearing shoes and is requesting toenail debridement     Physical Exam  Orientated 3  Vascular: Peripheral pitting edema bilaterally DP 0/4 bilaterally PT pulses 2/4 bilaterally Capillary reflex immediate bilaterally  Neurological: Sensation to 10 g monofilament wire intact 2/5 right 1/5 left Vibratory sensation nonreactive bilaterally Ankle reflexes reactive bilaterally  Dermatological: No open skin lesions bilaterally Dry skin bilaterally The toenails are extremely elongated, hypertrophic, discolored, deformed and tender direct palpation 6-10  Musculoskeletal: No deformities noted bilaterally There is no crepitus or restriction range of motion ankle, subtalar, or midtarsal joints bilaterally      Assessment & Plan:   Assessment: Decreased DP pulses bilaterally without history of claudication or open wounds Diabetic peripheral neuropathy Neglected symptomatic onychomycoses 6-10  Plan: The toenails 6-10 were debrided mechanically an electrically without any bleeding   Reappoint 3 months

## 2016-02-21 NOTE — Patient Instructions (Signed)
Diabetes and Foot Care Diabetes may cause you to have problems because of poor blood supply (circulation) to your feet and legs. This may cause the skin on your feet to become thinner, break easier, and heal more slowly. Your skin may become dry, and the skin may peel and crack. You may also have nerve damage in your legs and feet causing decreased feeling in them. You may not notice minor injuries to your feet that could lead to infections or more serious problems. Taking care of your feet is one of the most important things you can do for yourself.  HOME CARE INSTRUCTIONS  Wear shoes at all times, even in the house. Do not go barefoot. Bare feet are easily injured.  Check your feet daily for blisters, cuts, and redness. If you cannot see the bottom of your feet, use a mirror or ask someone for help.  Wash your feet with warm water (do not use hot water) and mild soap. Then pat your feet and the areas between your toes until they are completely dry. Do not soak your feet as this can dry your skin.  Apply a moisturizing lotion or petroleum jelly (that does not contain alcohol and is unscented) to the skin on your feet and to dry, brittle toenails. Do not apply lotion between your toes.  Trim your toenails straight across. Do not dig under them or around the cuticle. File the edges of your nails with an emery board or nail file.  Do not cut corns or calluses or try to remove them with medicine.  Wear clean socks or stockings every day. Make sure they are not too tight. Do not wear knee-high stockings since they may decrease blood flow to your legs.  Wear shoes that fit properly and have enough cushioning. To break in new shoes, wear them for just a few hours a day. This prevents you from injuring your feet. Always look in your shoes before you put them on to be sure there are no objects inside.  Do not cross your legs. This may decrease the blood flow to your feet.  If you find a minor scrape,  cut, or break in the skin on your feet, keep it and the skin around it clean and dry. These areas may be cleansed with mild soap and water. Do not cleanse the area with peroxide, alcohol, or iodine.  When you remove an adhesive bandage, be sure not to damage the skin around it.  If you have a wound, look at it several times a day to make sure it is healing.  Do not use heating pads or hot water bottles. They may burn your skin. If you have lost feeling in your feet or legs, you may not know it is happening until it is too late.  Make sure your health care provider performs a complete foot exam at least annually or more often if you have foot problems. Report any cuts, sores, or bruises to your health care provider immediately. SEEK MEDICAL CARE IF:   You have an injury that is not healing.  You have cuts or breaks in the skin.  You have an ingrown nail.  You notice redness on your legs or feet.  You feel burning or tingling in your legs or feet.  You have pain or cramps in your legs and feet.  Your legs or feet are numb.  Your feet always feel cold. SEEK IMMEDIATE MEDICAL CARE IF:   There is increasing redness,   swelling, or pain in or around a wound.  There is a red line that goes up your leg.  Pus is coming from a wound.  You develop a fever or as directed by your health care provider.  You notice a bad smell coming from an ulcer or wound.   This information is not intended to replace advice given to you by your health care provider. Make sure you discuss any questions you have with your health care provider.   Document Released: 11/22/2000 Document Revised: 07/28/2013 Document Reviewed: 05/04/2013 Elsevier Interactive Patient Education 2016 [employer] Inc[employer]  

## 2016-02-23 DIAGNOSIS — S61211A Laceration without foreign body of left index finger without damage to nail, initial encounter: Secondary | ICD-10-CM | POA: Diagnosis not present

## 2016-03-07 DIAGNOSIS — E876 Hypokalemia: Secondary | ICD-10-CM | POA: Diagnosis not present

## 2016-03-30 ENCOUNTER — Inpatient Hospital Stay (HOSPITAL_COMMUNITY)
Admission: EM | Admit: 2016-03-30 | Discharge: 2016-04-02 | DRG: 378 | Disposition: A | Discharge status: Home / Self Care | Payer: PPO | Attending: Family Medicine | Admitting: Family Medicine

## 2016-03-30 ENCOUNTER — Encounter (HOSPITAL_COMMUNITY): Payer: Self-pay | Admitting: Emergency Medicine

## 2016-03-30 DIAGNOSIS — Z6833 Body mass index (BMI) 33.0-33.9, adult: Secondary | ICD-10-CM

## 2016-03-30 DIAGNOSIS — E119 Type 2 diabetes mellitus without complications: Secondary | ICD-10-CM | POA: Diagnosis present

## 2016-03-30 DIAGNOSIS — Z8249 Family history of ischemic heart disease and other diseases of the circulatory system: Secondary | ICD-10-CM

## 2016-03-30 DIAGNOSIS — I25810 Atherosclerosis of coronary artery bypass graft(s) without angina pectoris: Secondary | ICD-10-CM | POA: Diagnosis present

## 2016-03-30 DIAGNOSIS — D649 Anemia, unspecified: Secondary | ICD-10-CM | POA: Diagnosis not present

## 2016-03-30 DIAGNOSIS — Z79899 Other long term (current) drug therapy: Secondary | ICD-10-CM | POA: Diagnosis not present

## 2016-03-30 DIAGNOSIS — Z87891 Personal history of nicotine dependence: Secondary | ICD-10-CM

## 2016-03-30 DIAGNOSIS — E1122 Type 2 diabetes mellitus with diabetic chronic kidney disease: Secondary | ICD-10-CM | POA: Diagnosis not present

## 2016-03-30 DIAGNOSIS — Z86010 Personal history of colonic polyps: Secondary | ICD-10-CM

## 2016-03-30 DIAGNOSIS — N179 Acute kidney failure, unspecified: Secondary | ICD-10-CM | POA: Diagnosis present

## 2016-03-30 DIAGNOSIS — Z833 Family history of diabetes mellitus: Secondary | ICD-10-CM

## 2016-03-30 DIAGNOSIS — E0821 Diabetes mellitus due to underlying condition with diabetic nephropathy: Secondary | ICD-10-CM | POA: Diagnosis not present

## 2016-03-30 DIAGNOSIS — E876 Hypokalemia: Secondary | ICD-10-CM | POA: Diagnosis not present

## 2016-03-30 DIAGNOSIS — J449 Chronic obstructive pulmonary disease, unspecified: Secondary | ICD-10-CM | POA: Diagnosis present

## 2016-03-30 DIAGNOSIS — Z7982 Long term (current) use of aspirin: Secondary | ICD-10-CM

## 2016-03-30 DIAGNOSIS — Z951 Presence of aortocoronary bypass graft: Secondary | ICD-10-CM

## 2016-03-30 DIAGNOSIS — N289 Disorder of kidney and ureter, unspecified: Secondary | ICD-10-CM | POA: Diagnosis not present

## 2016-03-30 DIAGNOSIS — K922 Gastrointestinal hemorrhage, unspecified: Secondary | ICD-10-CM | POA: Diagnosis not present

## 2016-03-30 DIAGNOSIS — K625 Hemorrhage of anus and rectum: Secondary | ICD-10-CM

## 2016-03-30 DIAGNOSIS — N184 Chronic kidney disease, stage 4 (severe): Secondary | ICD-10-CM | POA: Diagnosis present

## 2016-03-30 DIAGNOSIS — K5791 Diverticulosis of intestine, part unspecified, without perforation or abscess with bleeding: Principal | ICD-10-CM | POA: Diagnosis present

## 2016-03-30 DIAGNOSIS — I2510 Atherosclerotic heart disease of native coronary artery without angina pectoris: Secondary | ICD-10-CM | POA: Diagnosis present

## 2016-03-30 DIAGNOSIS — E785 Hyperlipidemia, unspecified: Secondary | ICD-10-CM | POA: Diagnosis present

## 2016-03-30 DIAGNOSIS — F329 Major depressive disorder, single episode, unspecified: Secondary | ICD-10-CM | POA: Diagnosis present

## 2016-03-30 DIAGNOSIS — Z794 Long term (current) use of insulin: Secondary | ICD-10-CM

## 2016-03-30 DIAGNOSIS — I714 Abdominal aortic aneurysm, without rupture, unspecified: Secondary | ICD-10-CM | POA: Diagnosis present

## 2016-03-30 DIAGNOSIS — E039 Hypothyroidism, unspecified: Secondary | ICD-10-CM | POA: Diagnosis not present

## 2016-03-30 DIAGNOSIS — R609 Edema, unspecified: Secondary | ICD-10-CM | POA: Diagnosis not present

## 2016-03-30 DIAGNOSIS — D62 Acute posthemorrhagic anemia: Secondary | ICD-10-CM | POA: Diagnosis not present

## 2016-03-30 DIAGNOSIS — I10 Essential (primary) hypertension: Secondary | ICD-10-CM | POA: Diagnosis present

## 2016-03-30 DIAGNOSIS — I129 Hypertensive chronic kidney disease with stage 1 through stage 4 chronic kidney disease, or unspecified chronic kidney disease: Secondary | ICD-10-CM | POA: Diagnosis not present

## 2016-03-30 DIAGNOSIS — K921 Melena: Secondary | ICD-10-CM | POA: Diagnosis not present

## 2016-03-30 HISTORY — DX: Atherosclerotic heart disease of native coronary artery without angina pectoris: I25.10

## 2016-03-30 LAB — CBC
HCT: 48.5 % (ref 39.0–52.0)
Hemoglobin: 15.7 g/dL (ref 13.0–17.0)
MCH: 30.6 pg (ref 26.0–34.0)
MCHC: 32.4 g/dL (ref 30.0–36.0)
MCV: 94.5 fL (ref 78.0–100.0)
Platelets: 162 10*3/uL (ref 150–400)
RBC: 5.13 MIL/uL (ref 4.22–5.81)
RDW: 13.8 % (ref 11.5–15.5)
WBC: 12.2 10*3/uL — ABNORMAL HIGH (ref 4.0–10.5)

## 2016-03-30 LAB — COMPREHENSIVE METABOLIC PANEL
ALT: 19 U/L (ref 17–63)
AST: 18 U/L (ref 15–41)
Albumin: 3.4 g/dL — ABNORMAL LOW (ref 3.5–5.0)
Alkaline Phosphatase: 63 U/L (ref 38–126)
Anion gap: 15 (ref 5–15)
BUN: 31 mg/dL — ABNORMAL HIGH (ref 6–20)
CO2: 26 mmol/L (ref 22–32)
Calcium: 9.6 mg/dL (ref 8.9–10.3)
Chloride: 104 mmol/L (ref 101–111)
Creatinine, Ser: 2.39 mg/dL — ABNORMAL HIGH (ref 0.61–1.24)
GFR calc Af Amer: 28 mL/min — ABNORMAL LOW (ref >60)
GFR calc non Af Amer: 24 mL/min — ABNORMAL LOW (ref >60)
Glucose, Bld: 93 mg/dL (ref 65–99)
Potassium: 3 mmol/L — ABNORMAL LOW (ref 3.5–5.1)
Sodium: 145 mmol/L (ref 135–145)
Total Bilirubin: 0.4 mg/dL (ref 0.3–1.2)
Total Protein: 6.2 g/dL — ABNORMAL LOW (ref 6.5–8.1)

## 2016-03-30 LAB — PROTIME-INR
INR: 1.08 (ref 0.00–1.49)
Prothrombin Time: 14.2 seconds (ref 11.6–15.2)

## 2016-03-30 MED ORDER — SODIUM CHLORIDE 0.9 % IV SOLN
1000.0000 mL | INTRAVENOUS | Status: DC
Start: 2016-03-30 — End: 2016-04-01
  Administered 2016-03-30 – 2016-04-01 (×3): 1000 mL via INTRAVENOUS

## 2016-03-30 MED ORDER — SODIUM CHLORIDE 0.9 % IV SOLN
1000.0000 mL | Freq: Once | INTRAVENOUS | Status: AC
  Administered 2016-03-30: 1000 mL via INTRAVENOUS

## 2016-03-30 NOTE — ED Notes (Signed)
Pt. reports rectal bleeding onset this evening , denies injury , no dizziness or fever .

## 2016-03-30 NOTE — ED Provider Notes (Signed)
CSN: [id number]     Arrival date & time 03/30/16  2219 History  By signing my name below, I, Hansel Feinstein, attest that this documentation has been prepared under the direction and in the presence of Delora Fuel, MD. Electronically Signed: Hansel Feinstein, ED Scribe. 03/30/2016. 11:29 PM.    Chief Complaint  Patient presents with  . Rectal Bleeding   The history is provided by the patient. No language interpreter was used.   HPI Comments: [name] is a 82 y.o. male with h/o HTN, HLD, DM, CAD who presents to the Emergency Department complaining of 10 episodes of bright red rectal bleeding with clots onset tonight at 8pm. Pt states associated lightheadedness, nausea, one episode of "coffee ground" emesis. Pt takes qd 81 mg ASA, but no other daily anticoagulants. No h/o of similar symptoms. Last colonoscopy was >5 yrs ago by Dr. Watt Climes with no known abnormal findings. Denies dizziness, diaphoresis, hematemesis, fever, weakness, syncope, increased leg swelling from baseline. PCP is Dr. Hulan Fess.   Past Medical History  Diagnosis Date  . Toxic goiter   . Hypertension   . Hyperlipidemia   . Diabetes mellitus without complication ([place])   . Coronary artery disease    Past Surgical History  Procedure Laterality Date  . Abdominal aortic aneurysm repair    . Cardiac bypass    . Coronary artery bypass graft     Family History  Problem Relation Age of Onset  . Stroke Mother   . Stroke Father   . Heart attack Father   . Diabetes Sister   . Heart attack Sister   . Diabetes Brother   . Heart attack Brother    Social History  Substance Use Topics  . Smoking status: Former Research scientist (life sciences)  . Smokeless tobacco: Never Used  . Alcohol Use: No    Review of Systems  Constitutional: Negative for fever and diaphoresis.  Cardiovascular: Negative for leg swelling.  Gastrointestinal: Positive for nausea, vomiting and blood in stool.  Neurological: Positive for light-headedness. Negative for dizziness,  syncope and weakness.  All other systems reviewed and are negative.  Allergies  Review of patient's allergies indicates no known allergies.  Home Medications   Prior to Admission medications   Medication Sig Start Date End Date Taking? Authorizing Provider  acetaminophen (TYLENOL) 650 MG CR tablet Take 650 mg by mouth every 8 (eight) hours as needed for pain.    Historical Provider, MD  aspirin 325 MG tablet Take 325 mg by mouth daily.      Historical Provider, MD  atorvastatin (LIPITOR) 40 MG tablet Take 40 mg by mouth daily.      Historical Provider, MD  calcitRIOL (ROCALTROL) 0.25 MCG capsule Take 0.25 mcg by mouth daily.      Historical Provider, MD  doxepin (SINEQUAN) 25 MG capsule Take 25 mg by mouth at bedtime.      Historical Provider, MD  furosemide (LASIX) 80 MG tablet Take 160 mg by mouth 2 (two) times daily.    Historical Provider, MD  insulin aspart protamine-insulin aspart (NOVOLOG 70/30) (70-30) 100 UNIT/ML injection Inject 60 Units into the skin daily with breakfast.     Historical Provider, MD  insulin aspart protamine-insulin aspart (NOVOLOG 70/30) (70-30) 100 UNIT/ML injection Inject 40 Units into the skin daily with supper.      Historical Provider, MD  lisinopril (PRINIVIL,ZESTRIL) 10 MG tablet Take 10 mg by mouth daily.      Historical Provider, MD  metoprolol succinate (TOPROL-XL) 25  MG 24 hr tablet Take 1 tablet (25 mg total) by mouth daily. 07/03/15   Belva Crome, MD  niacin (NIASPAN) 1000 MG CR tablet Take 1,000 mg by mouth 2 (two) times daily.    Historical Provider, MD   BP 105/65 mmHg  Pulse 92  Temp(Src) 97.7 F (36.5 C) (Oral)  Resp 24  Ht 5\' 10"  (1.778 m)  Wt 237 lb 1 oz (107.531 kg)  BMI 34.01 kg/m2  SpO2 97% Physical Exam  Constitutional: He is oriented to person, place, and time. He appears well-developed and well-nourished.  HENT:  Head: Normocephalic and atraumatic.  Eyes: Conjunctivae and EOM are normal. Pupils are equal, round, and reactive  to light.  Neck: Normal range of motion. Neck supple. No JVD present.  Cardiovascular: Normal rate, regular rhythm and normal heart sounds.  Exam reveals no gallop and no friction rub.   No murmur heard. Pulmonary/Chest: Effort normal and breath sounds normal. He has no wheezes. He has no rales. He exhibits no tenderness.  Abdominal: Soft. Bowel sounds are normal. He exhibits no distension and no mass. There is no tenderness.  Musculoskeletal: Normal range of motion.  1+ pitting edema to bilateral lower extremities. Venous stasis changes present. Dried blood noted on legs.   Lymphadenopathy:    He has no cervical adenopathy.  Neurological: He is alert and oriented to person, place, and time. No cranial nerve deficit. He exhibits normal muscle tone. Coordination normal.  Skin: Skin is warm and dry. No rash noted.  Psychiatric: He has a normal mood and affect. His behavior is normal. Judgment and thought content normal.  Nursing note and vitals reviewed.   ED Course  Procedures (including critical care time) DIAGNOSTIC STUDIES: Oxygen Saturation is 97% on RA, normal by my interpretation.    COORDINATION OF CARE: 11:23 PM Discussed treatment plan with pt at bedside which includes lab work and pt agreed to plan.   Labs Review Results for orders placed or performed during the hospital encounter of 03/30/16  Comprehensive metabolic panel  Result Value Ref Range   Sodium 145 135 - 145 mmol/L   Potassium 3.0 (L) 3.5 - 5.1 mmol/L   Chloride 104 101 - 111 mmol/L   CO2 26 22 - 32 mmol/L   Glucose, Bld 93 65 - 99 mg/dL   BUN 31 (H) 6 - 20 mg/dL   Creatinine, Ser 2.39 (H) 0.61 - 1.24 mg/dL   Calcium 9.6 8.9 - 10.3 mg/dL   Total Protein 6.2 (L) 6.5 - 8.1 g/dL   Albumin 3.4 (L) 3.5 - 5.0 g/dL   AST 18 15 - 41 U/L   ALT 19 17 - 63 U/L   Alkaline Phosphatase 63 38 - 126 U/L   Total Bilirubin 0.4 0.3 - 1.2 mg/dL   GFR calc non Af Amer 24 (L) >60 mL/min   GFR calc Af Amer 28 (L) >60 mL/min    Anion gap 15 5 - 15  CBC  Result Value Ref Range   WBC 12.2 (H) 4.0 - 10.5 K/uL   RBC 5.13 4.22 - 5.81 MIL/uL   Hemoglobin 15.7 13.0 - 17.0 g/dL   HCT 48.5 39.0 - 52.0 %   MCV 94.5 78.0 - 100.0 fL   MCH 30.6 26.0 - 34.0 pg   MCHC 32.4 30.0 - 36.0 g/dL   RDW 13.8 11.5 - 15.5 %   Platelets 162 150 - 400 K/uL  Protime-INR - (order if Patient is taking Coumadin / Warfarin)  Result  Value Ref Range   Prothrombin Time 14.2 11.6 - 15.2 seconds   INR 1.08 0.00 - 1.49  Type and screen [place]  Result Value Ref Range   ABO/RH(D) O NEG    Antibody Screen NEG    Sample Expiration 04/02/2016    I have personally reviewed and evaluated these  Lab lts as part of my medical decision-making.   MDM   Final diagnoses:  Rectal bleeding  Renal insufficiency  Hypokalemia    Rectal bleeding with bright red blood. Patient has passed bright red blood several times since arriving in the ED. Old records are reviewed and he has no prior relevant visits. It is noted that he has an abdominal aortic aneurysm which was treated with endovascular repair and last CT scan and 2016 showed decreasing size of the aneurysm. He has had not tachycardic or hypotensive although blood pressure is lower than it has been at previous office visits. Orthostatic vital signs will be checked. I reviewed his prior abdominal CT angiograms and do not see obvious diverticulosis. He will need to be admitted. Case is discussed with Dr. Blaine Hamper [facility], who agrees to admit the patient.  Orthostatic vital signs show significant drop in blood pressure down to 74 systolic. Laboratory workup shows hemoglobin actually higher than baseline, but this will drop as he equilibrates. Renal insufficiency is present not significantly changed from baseline. Hypokalemia is noted, presumably related to furosemide use, and he is given intravenous potassium.  I personally performed the services described in this  documentation, which was scribed in my presence. The recorded information has been reviewed and is accurate.       Delora Fuel, MD [id number] [phone]

## 2016-03-31 ENCOUNTER — Encounter (HOSPITAL_COMMUNITY): Payer: Self-pay | Admitting: *Deleted

## 2016-03-31 DIAGNOSIS — D62 Acute posthemorrhagic anemia: Secondary | ICD-10-CM | POA: Diagnosis not present

## 2016-03-31 DIAGNOSIS — N184 Chronic kidney disease, stage 4 (severe): Secondary | ICD-10-CM | POA: Diagnosis not present

## 2016-03-31 DIAGNOSIS — E785 Hyperlipidemia, unspecified: Secondary | ICD-10-CM

## 2016-03-31 DIAGNOSIS — J449 Chronic obstructive pulmonary disease, unspecified: Secondary | ICD-10-CM | POA: Diagnosis present

## 2016-03-31 DIAGNOSIS — K5791 Diverticulosis of intestine, part unspecified, without perforation or abscess with bleeding: Secondary | ICD-10-CM | POA: Diagnosis not present

## 2016-03-31 DIAGNOSIS — Z833 Family history of diabetes mellitus: Secondary | ICD-10-CM | POA: Diagnosis not present

## 2016-03-31 DIAGNOSIS — I25810 Atherosclerosis of coronary artery bypass graft(s) without angina pectoris: Secondary | ICD-10-CM | POA: Diagnosis not present

## 2016-03-31 DIAGNOSIS — K922 Gastrointestinal hemorrhage, unspecified: Secondary | ICD-10-CM

## 2016-03-31 DIAGNOSIS — I2510 Atherosclerotic heart disease of native coronary artery without angina pectoris: Secondary | ICD-10-CM | POA: Diagnosis present

## 2016-03-31 DIAGNOSIS — Z8249 Family history of ischemic heart disease and other diseases of the circulatory system: Secondary | ICD-10-CM | POA: Diagnosis not present

## 2016-03-31 DIAGNOSIS — Z79899 Other long term (current) drug therapy: Secondary | ICD-10-CM | POA: Diagnosis not present

## 2016-03-31 DIAGNOSIS — Z86010 Personal history of colonic polyps: Secondary | ICD-10-CM | POA: Diagnosis not present

## 2016-03-31 DIAGNOSIS — K625 Hemorrhage of anus and rectum: Secondary | ICD-10-CM | POA: Insufficient documentation

## 2016-03-31 DIAGNOSIS — K921 Melena: Secondary | ICD-10-CM | POA: Diagnosis not present

## 2016-03-31 DIAGNOSIS — E1122 Type 2 diabetes mellitus with diabetic chronic kidney disease: Secondary | ICD-10-CM | POA: Diagnosis present

## 2016-03-31 DIAGNOSIS — N179 Acute kidney failure, unspecified: Secondary | ICD-10-CM | POA: Diagnosis present

## 2016-03-31 DIAGNOSIS — E876 Hypokalemia: Secondary | ICD-10-CM | POA: Insufficient documentation

## 2016-03-31 DIAGNOSIS — Z87891 Personal history of nicotine dependence: Secondary | ICD-10-CM | POA: Diagnosis not present

## 2016-03-31 DIAGNOSIS — F329 Major depressive disorder, single episode, unspecified: Secondary | ICD-10-CM | POA: Diagnosis present

## 2016-03-31 DIAGNOSIS — Z7982 Long term (current) use of aspirin: Secondary | ICD-10-CM | POA: Diagnosis not present

## 2016-03-31 DIAGNOSIS — E0821 Diabetes mellitus due to underlying condition with diabetic nephropathy: Secondary | ICD-10-CM | POA: Diagnosis not present

## 2016-03-31 DIAGNOSIS — Z951 Presence of aortocoronary bypass graft: Secondary | ICD-10-CM | POA: Diagnosis not present

## 2016-03-31 DIAGNOSIS — E039 Hypothyroidism, unspecified: Secondary | ICD-10-CM | POA: Diagnosis present

## 2016-03-31 DIAGNOSIS — I129 Hypertensive chronic kidney disease with stage 1 through stage 4 chronic kidney disease, or unspecified chronic kidney disease: Secondary | ICD-10-CM | POA: Diagnosis present

## 2016-03-31 DIAGNOSIS — Z6833 Body mass index (BMI) 33.0-33.9, adult: Secondary | ICD-10-CM | POA: Diagnosis not present

## 2016-03-31 DIAGNOSIS — Z794 Long term (current) use of insulin: Secondary | ICD-10-CM | POA: Diagnosis not present

## 2016-03-31 DIAGNOSIS — R609 Edema, unspecified: Secondary | ICD-10-CM | POA: Diagnosis present

## 2016-03-31 LAB — GLUCOSE, CAPILLARY
Glucose-Capillary: 157 mg/dL — ABNORMAL HIGH (ref 65–99)
Glucose-Capillary: 136 mg/dL — ABNORMAL HIGH (ref 65–99)
Glucose-Capillary: 142 mg/dL — ABNORMAL HIGH (ref 65–99)
Glucose-Capillary: 153 mg/dL — ABNORMAL HIGH (ref 65–99)

## 2016-03-31 LAB — CBC
HCT: 40.6 % (ref 39.0–52.0)
HCT: 39.3 % (ref 39.0–52.0)
HCT: 34.1 % — ABNORMAL LOW (ref 39.0–52.0)
HCT: 32.6 % — ABNORMAL LOW (ref 39.0–52.0)
HCT: 30.5 % — ABNORMAL LOW (ref 39.0–52.0)
Hemoglobin: 13.4 g/dL (ref 13.0–17.0)
Hemoglobin: 12.5 g/dL — ABNORMAL LOW (ref 13.0–17.0)
Hemoglobin: 10.9 g/dL — ABNORMAL LOW (ref 13.0–17.0)
Hemoglobin: 10.3 g/dL — ABNORMAL LOW (ref 13.0–17.0)
Hemoglobin: 9.8 g/dL — ABNORMAL LOW (ref 13.0–17.0)
MCH: 31 pg (ref 26.0–34.0)
MCH: 29.8 pg (ref 26.0–34.0)
MCH: 29.9 pg (ref 26.0–34.0)
MCH: 29.7 pg (ref 26.0–34.0)
MCH: 30.2 pg (ref 26.0–34.0)
MCHC: 33 g/dL (ref 30.0–36.0)
MCHC: 31.8 g/dL (ref 30.0–36.0)
MCHC: 32 g/dL (ref 30.0–36.0)
MCHC: 31.6 g/dL (ref 30.0–36.0)
MCHC: 32.1 g/dL (ref 30.0–36.0)
MCV: 94 fL (ref 78.0–100.0)
MCV: 93.6 fL (ref 78.0–100.0)
MCV: 93.4 fL (ref 78.0–100.0)
MCV: 93.9 fL (ref 78.0–100.0)
MCV: 94.1 fL (ref 78.0–100.0)
Platelets: 160 10*3/uL (ref 150–400)
Platelets: 156 10*3/uL (ref 150–400)
Platelets: 132 10*3/uL — ABNORMAL LOW (ref 150–400)
Platelets: 119 10*3/uL — ABNORMAL LOW (ref 150–400)
Platelets: 124 10*3/uL — ABNORMAL LOW (ref 150–400)
RBC: 4.32 MIL/uL (ref 4.22–5.81)
RBC: 4.2 MIL/uL — ABNORMAL LOW (ref 4.22–5.81)
RBC: 3.65 MIL/uL — ABNORMAL LOW (ref 4.22–5.81)
RBC: 3.47 MIL/uL — ABNORMAL LOW (ref 4.22–5.81)
RBC: 3.24 MIL/uL — ABNORMAL LOW (ref 4.22–5.81)
RDW: 13.8 % (ref 11.5–15.5)
RDW: 13.8 % (ref 11.5–15.5)
RDW: 13.9 % (ref 11.5–15.5)
RDW: 14.1 % (ref 11.5–15.5)
RDW: 14.2 % (ref 11.5–15.5)
WBC: 14.9 10*3/uL — ABNORMAL HIGH (ref 4.0–10.5)
WBC: 15.5 10*3/uL — ABNORMAL HIGH (ref 4.0–10.5)
WBC: 10.6 10*3/uL — ABNORMAL HIGH (ref 4.0–10.5)
WBC: 7.8 10*3/uL (ref 4.0–10.5)
WBC: 8.4 10*3/uL (ref 4.0–10.5)

## 2016-03-31 LAB — LIPID PANEL
Cholesterol: 93 mg/dL (ref 0–200)
HDL: 35 mg/dL — ABNORMAL LOW (ref >40)
LDL Cholesterol: 38 mg/dL (ref 0–99)
Total CHOL/HDL Ratio: 2.7 RATIO
Triglycerides: 102 mg/dL (ref <150)
VLDL: 20 mg/dL (ref 0–40)

## 2016-03-31 LAB — BASIC METABOLIC PANEL
Anion gap: 12 (ref 5–15)
BUN: 32 mg/dL — ABNORMAL HIGH (ref 6–20)
CO2: 22 mmol/L (ref 22–32)
Calcium: 8.5 mg/dL — ABNORMAL LOW (ref 8.9–10.3)
Chloride: 110 mmol/L (ref 101–111)
Creatinine, Ser: 2.15 mg/dL — ABNORMAL HIGH (ref 0.61–1.24)
GFR calc Af Amer: 32 mL/min — ABNORMAL LOW (ref >60)
GFR calc non Af Amer: 28 mL/min — ABNORMAL LOW (ref >60)
Glucose, Bld: 155 mg/dL — ABNORMAL HIGH (ref 65–99)
Potassium: 3.6 mmol/L (ref 3.5–5.1)
Sodium: 144 mmol/L (ref 135–145)

## 2016-03-31 LAB — I-STAT CG4 LACTIC ACID, ED: Lactic Acid, Venous: 1.85 mmol/L (ref 0.5–2.0)

## 2016-03-31 LAB — POC URINE PREG, ED: Preg Test, Ur: POSITIVE — AB

## 2016-03-31 LAB — MAGNESIUM: Magnesium: 1.5 mg/dL — ABNORMAL LOW (ref 1.7–2.4)

## 2016-03-31 LAB — TSH: TSH: 1.149 u[IU]/mL (ref 0.350–4.500)

## 2016-03-31 LAB — APTT: aPTT: 31 seconds (ref 24–37)

## 2016-03-31 MED ORDER — POTASSIUM CHLORIDE 20 MEQ/15ML (10%) PO SOLN
40.0000 meq | Freq: Once | ORAL | Status: AC
  Administered 2016-03-31: 40 meq via ORAL
  Filled 2016-03-31: qty 30

## 2016-03-31 MED ORDER — SERTRALINE HCL 50 MG PO TABS
50.0000 mg | ORAL_TABLET | Freq: Every day | ORAL | Status: DC
  Administered 2016-03-31 – 2016-04-02 (×3): 50 mg via ORAL
  Filled 2016-03-31 (×3): qty 1

## 2016-03-31 MED ORDER — METOPROLOL SUCCINATE ER 25 MG PO TB24
25.0000 mg | ORAL_TABLET | Freq: Every day | ORAL | Status: DC
  Administered 2016-03-31 – 2016-04-02 (×3): 25 mg via ORAL
  Filled 2016-03-31 (×3): qty 1

## 2016-03-31 MED ORDER — ONDANSETRON HCL 4 MG/2ML IJ SOLN: 4.0000 mg | Freq: Three times a day (TID) | INTRAMUSCULAR | Status: DC | PRN

## 2016-03-31 MED ORDER — PANTOPRAZOLE SODIUM 40 MG IV SOLR
40.0000 mg | Freq: Two times a day (BID) | INTRAVENOUS | Status: DC
Start: 2016-03-31 — End: 2016-04-02
  Administered 2016-03-31 – 2016-04-02 (×6): 40 mg via INTRAVENOUS
  Filled 2016-03-31 (×6): qty 40

## 2016-03-31 MED ORDER — ACETAMINOPHEN 650 MG RE SUPP
650.0000 mg | Freq: Four times a day (QID) | RECTAL | Status: DC | PRN
Start: 2016-03-31 — End: 2016-04-02

## 2016-03-31 MED ORDER — INSULIN ASPART 100 UNIT/ML ~~LOC~~ SOLN
0.0000 [IU] | Freq: Three times a day (TID) | SUBCUTANEOUS | Status: DC
  Administered 2016-03-31: 2 [IU] via SUBCUTANEOUS
  Administered 2016-03-31 (×2): 1 [IU] via SUBCUTANEOUS
  Administered 2016-04-01: 2 [IU] via SUBCUTANEOUS
  Administered 2016-04-01: 1 [IU] via SUBCUTANEOUS
  Administered 2016-04-01: 2 [IU] via SUBCUTANEOUS
  Administered 2016-04-02: 1 [IU] via SUBCUTANEOUS
  Administered 2016-04-02: 2 [IU] via SUBCUTANEOUS

## 2016-03-31 MED ORDER — ACETAMINOPHEN 325 MG PO TABS: 650.0000 mg | ORAL_TABLET | Freq: Four times a day (QID) | ORAL | Status: DC | PRN

## 2016-03-31 MED ORDER — ONDANSETRON HCL 4 MG/2ML IJ SOLN
4.0000 mg | Freq: Once | INTRAMUSCULAR | Status: AC
  Administered 2016-03-31: 4 mg via INTRAVENOUS
  Filled 2016-03-31: qty 2

## 2016-03-31 MED ORDER — SODIUM CHLORIDE 0.9 % IV SOLN
INTRAVENOUS | Status: AC
  Administered 2016-03-31: 02:00:00 via INTRAVENOUS

## 2016-03-31 MED ORDER — POTASSIUM CHLORIDE 10 MEQ/100ML IV SOLN
10.0000 meq | Freq: Once | INTRAVENOUS | Status: AC
  Administered 2016-03-31: 10 meq via INTRAVENOUS
  Filled 2016-03-31: qty 100

## 2016-03-31 MED ORDER — INSULIN ASPART PROT & ASPART (70-30 MIX) 100 UNIT/ML ~~LOC~~ SUSP
30.0000 [IU] | Freq: Two times a day (BID) | SUBCUTANEOUS | Status: DC
  Filled 2016-03-31: qty 10

## 2016-03-31 MED ORDER — NIACIN ER 500 MG PO CPCR
1000.0000 mg | ORAL_CAPSULE | Freq: Two times a day (BID) | ORAL | Status: DC
  Administered 2016-03-31 – 2016-04-02 (×6): 1000 mg via ORAL
  Filled 2016-03-31 (×7): qty 2

## 2016-03-31 MED ORDER — INSULIN ASPART PROT & ASPART (70-30 MIX) 100 UNIT/ML ~~LOC~~ SUSP: 20.0000 [IU] | Freq: Two times a day (BID) | SUBCUTANEOUS | Status: DC

## 2016-03-31 MED ORDER — POTASSIUM CHLORIDE 10 MEQ/100ML IV SOLN: 10.0000 meq | INTRAVENOUS | Status: DC

## 2016-03-31 MED ORDER — LEVOTHYROXINE SODIUM 75 MCG PO TABS
75.0000 ug | ORAL_TABLET | Freq: Every day | ORAL | Status: DC
  Administered 2016-03-31 – 2016-04-02 (×3): 75 ug via ORAL
  Filled 2016-03-31 (×4): qty 1

## 2016-03-31 MED ORDER — MORPHINE SULFATE (PF) 2 MG/ML IV SOLN: 1.0000 mg | INTRAVENOUS | Status: DC | PRN

## 2016-03-31 NOTE — Progress Notes (Signed)
Patient arrived on unit via stretcher from ED, daughter-in-law at bedside. Telemetry placed and CMT notified.

## 2016-03-31 NOTE — Progress Notes (Signed)
Called ED spoke with [facility].

## 2016-03-31 NOTE — H&P (Signed)
History and Physical    [name] [id number] DOB: [birth year] DOA: 03/30/2016  Referring MD/NP/PA:   PCP: Gennette Pac, MD  Outpatient Specialists: Vascular surgeon, Dr. Rachelle Hora Patient coming from:  Home    Chief Complaint: GIB  HPI: [name] is a 82 y.o. male with medical history significant of AAA (s/p repair by Dr. Scot Dock), hypertension, hyperlipidemia, diabetes mellitus, hypothyroidism, depression, COPD, s/p CABG, chronic kidney disease-stage IV, chronic leg edema, who presents with GI bleeding.  Patient reports that he started having rectal bleeding at about 8:15 PM. He had more than 10 times of rectal bleeding, with large amount of blood for 5 times. The blood is bright red in color. He also had nausea and vomited black materials for 3 times. Patient does not have abdominal pain, chest pain, shortness breath, fever, chills, symptoms of UTI or unilateral weakness. He is taking aspirin 325 mg daily, but no other blood thinner or NSAIDs. Patient is orthostatic in the emergency room. He did not have EGD, but had colonoscopy by Dr. Tami Lin 2011 and had polyps removed.  ED Course: pt was found to have hemoglobin 15.7, which was 13.2 on 10/24/07. Potassium is 3.0, worsening renal function, WBC 12.2, INR 1.08, temperature normal, no tachycardia. Patient is admitted to inpatient for further eval and treatment and observation.  Can patient participate in ADLs? Some   Review of Systems:   General: no fevers, chills, no changes in body weight, has poor appetite, has fatigue HEENT: no blurry vision, hearing changes or sore throat Pulm: no dyspnea, coughing, wheezing CV: no chest pain, no palpitations Abd: has nausea, vomiting and rectal bleeding, no abdominal pain, diarrhea, constipation GU: no dysuria, burning on urination, increased urinary frequency, hematuria  Ext: has leg edema Neuro: no unilateral weakness, numbness, or tingling, no vision change or hearing loss Skin:  no rash MSK: No muscle spasm, no deformity, no limitation of range of movement in spin Heme: No easy bruising.  Travel history: No recent long distant travel.  Allergy: No Known Allergies  Past Medical History  Diagnosis Date  . Toxic goiter   . Hypertension   . Hyperlipidemia   . Diabetes mellitus without complication ([place])   . Coronary artery disease     Past Surgical History  Procedure Laterality Date  . Abdominal aortic aneurysm repair    . Cardiac bypass    . Coronary artery bypass graft      Social History:  reports that he has quit smoking. He has never used smokeless tobacco. He reports that he does not drink alcohol. His drug history is not on file.  Family History:  Family History  Problem Relation Age of Onset  . Stroke Mother   . Stroke Father   . Heart attack Father   . Diabetes Sister   . Heart attack Sister   . Diabetes Brother   . Heart attack Brother      Prior to Admission medications   Medication Sig Start Date End Date Taking? Authorizing Provider  aspirin 325 MG tablet Take 325 mg by mouth daily.     Yes Historical Provider, MD  calcitRIOL (ROCALTROL) 0.25 MCG capsule Take 0.5-0.75 mcg by mouth daily. Alternate take 2 capsules one day then 3 capsules the next day   Yes Historical Provider, MD  furosemide (LASIX) 80 MG tablet Take 80-160 mg by mouth See admin instructions. Take 2 tablets every morning and take 1 tablet at noon   Yes Historical Provider, MD  insulin  aspart protamine-insulin aspart (NOVOLOG 70/30) (70-30) 100 UNIT/ML injection Inject 35-40 Units into the skin 2 (two) times daily with a meal. Use 35 units every morning and use 40 units every evening   Yes Historical Provider, MD  levothyroxine (SYNTHROID, LEVOTHROID) 75 MCG tablet Take 75 mcg by mouth daily before breakfast.   Yes Historical Provider, MD  metoprolol succinate (TOPROL-XL) 25 MG 24 hr tablet Take 1 tablet (25 mg total) by mouth daily. 07/03/15  Yes Belva Crome, MD  niacin  (NIASPAN) 1000 MG CR tablet Take 1,000 mg by mouth 2 (two) times daily.   Yes Historical Provider, MD  potassium chloride (K-DUR,KLOR-CON) 10 MEQ tablet Take 10 mEq by mouth 2 (two) times daily.   Yes Historical Provider, MD  sertraline (ZOLOFT) 50 MG tablet Take 50 mg by mouth daily.   Yes Historical Provider, MD    Physical Exam: Filed Vitals:   03/30/16 2345 03/31/16 0015 03/31/16 0025 03/31/16 0030  BP: 131/82 94/70 88/52  106/66  Pulse: 83 86 89 80  Temp:      TempSrc:      Resp: 18 23 19 21   Height:      Weight:      SpO2: 94% 97% 97% 98%   General: Not in acute distress HEENT:       Eyes: PERRL, EOMI, no scleral icterus.       ENT: No discharge from the ears and nose, no pharynx injection, no tonsillar enlargement.        Neck: No JVD, no bruit, no mass felt. Heme: No neck lymph node enlargement. Cardiac: S1/S2, RRR, No murmurs, No gallops or rubs. Pulm: No rales, wheezing, rhonchi or rubs. Abd: Soft, nondistended, nontender, no rebound pain, no organomegaly, BS present. GU: No hematuria Ext: 1+ pitting leg edema bilaterally. 2+DP/PT pulse bilaterally. Musculoskeletal: No joint deformities, No joint redness or warmth, no limitation of ROM in spin. Skin: No rashes.  Neuro: Alert, oriented X3, cranial nerves II-XII grossly intact, moves all extremities normally. Psych: Patient is not psychotic, no suicidal or hemocidal ideation.  Labs on Admission: I have personally reviewed following labs and imaging studies  CBC:  Recent Labs Lab 03/30/16 2248  WBC 12.2*  HGB 15.7  HCT 48.5  MCV 94.5  PLT 0000000   Basic Metabolic Panel:  Recent Labs Lab 03/30/16 2248  NA 145  K 3.0*  CL 104  CO2 26  GLUCOSE 93  BUN 31*  CREATININE 2.39*  CALCIUM 9.6   GFR: Estimated Creatinine Clearance: 30.8 mL/min (by C-G formula based on Cr of 2.39). Liver Function Tests:  Recent Labs Lab 03/30/16 2248  AST 18  ALT 19  ALKPHOS 63  BILITOT 0.4  PROT 6.2*  ALBUMIN 3.4*    No results for input(s): LIPASE, AMYLASE in the last 168 hours. No results for input(s): AMMONIA in the last 168 hours. Coagulation Profile:  Recent Labs Lab 03/30/16 2248  INR 1.08   Cardiac Enzymes: No results for input(s): CKTOTAL, CKMB, CKMBINDEX, TROPONINI in the last 168 hours. BNP (last 3 results) No results for input(s): PROBNP in the last 8760 hours. HbA1C: No results for input(s): HGBA1C in the last 72 hours. CBG: No results for input(s): GLUCAP in the last 168 hours. Lipid Profile: No results for input(s): CHOL, HDL, LDLCALC, TRIG, CHOLHDL, LDLDIRECT in the last 72 hours. Thyroid Function Tests: No results for input(s): TSH, T4TOTAL, FREET4, T3FREE, THYROIDAB in the last 72 hours. Anemia Panel: No results for input(s): VITAMINB12, FOLATE, FERRITIN,  TIBC, IRON, RETICCTPCT in the last 72 hours. Urine analysis: No results found for: COLORURINE, APPEARANCEUR, LABSPEC, PHURINE, GLUCOSEU, HGBUR, BILIRUBINUR, KETONESUR, PROTEINUR, UROBILINOGEN, NITRITE, LEUKOCYTESUR Sepsis Labs: @LABRCNTIP (procalcitonin:4,lacticidven:4) )No results found for this or any previous visit (from the past 240 hour(s)).   Radiological Exams on Admission: No results found.   EKG: Independently reviewed.  Not done in ED, will get one.   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 Initial hemoglobin 15.7-->13.4 on repeated CBC. Hemodynamically stable. No chest pain or shortness of breath.  Lactate level is I.85.   - will admit to tele bed - NPO - IVF: 1L NS and then 125 mL/hr - Start IV pantoprazole 40 mg bib - Zofran IV for nausea - Avoid NSAIDs and SQ heparin - Maintain IV access (2 large bore IVs if possible). - Monitor closely and follow q6h cbc, transfuse as necessary. - LaB: INR, PTT and type&screen - Please call GI in AM  CAD (coronary artery disease) of artery bypass graft: No CP. -Hold ASA due to GIB -continue metoprolol  Hypertension: -Hold Lasix -Metoprolol  DM-II: Last A1c not on record. Patient is taking 70/30 insuline at home -will decrease 70/30 insulin dose from 40-60 units in AM and PM to 30 unit bid  -SSI -Check A1c  CKD-III: Baseline creatinine 2.01 on 05/11/15. His creatinine is 2.39, BUN 31, which is slightly worsening in the baseline. -On IVF -f/u renal Fx by BMP  Hypokalemia: K= 3.0 on admission. - Repleted - Check Mg level   DVT ppx: SCD Code Status: Full code Family Communication: Yes, patient's wife and son  at bed side Disposition Plan:  Anticipate discharge back to previous home environment Consults called: None Admission status:  obs / tele   Date of Service 03/31/2016    Ivor Costa [facility] Pager [phone]  If 7PM-7AM, please contact night-coverage [email or web address] Password [email or web address] 03/31/2016, 1:05 AM

## 2016-03-31 NOTE — Progress Notes (Signed)
Subjective: Patient admitted this morning, see detailed H&P by Dr.Niu 82 y.o. male with medical history significant of AAA (s/p repair by Dr. Scot Dock), hypertension, hyperlipidemia, diabetes mellitus, hypothyroidism, depression, COPD, s/p CABG, chronic kidney disease-stage IV, chronic leg edema, who presents with GI bleeding. Patient denies any bloody bowel movement this morning. Hemoglobin has been stable. Filed Vitals:   03/31/16 0715 03/31/16 0819  BP:  135/64  Pulse: 78 80  Temp:  98.2 F (36.8 C)  Resp: 18 16    Chest: Clear Bilaterally Heart : S1S2 RRR Abdomen: Soft, nontender Ext : No edema Neuro: Alert, oriented x 3  A/P  Lower GI bleed Consulted [facility]. Called and discussed with Dr. Cristina Gong, and he says that patient has history of diverticulosis seen on colonoscopy in 2011. He will see the patient today.  ? Coffee-ground emesis- patient had one questionable coffee-ground emesis, continue Protonix.  CAD- hold aspirin, continue metoprolol  Hypertension- blood pressure stable, continue metoprolol  Diabetes mellitus- hold insulin 70/30 as patient nothing by mouth. Continue sliding scale insulin with NovoLog  CKD stage III- patient's baseline creatinine 2.01, creatinine on admission 2.39. Will follow BMP in a.m.    [facility] [phone]

## 2016-03-31 NOTE — Consult Note (Signed)
Referring Provider:   Dr. Eleonore Chiquito Primary Care Physician:  Gennette Pac, MD Primary Gastroenterologist:  Dr. Watt Climes  Reason for Consultation:  hematochezia  HPI: [name] is a 82 y.o. male on daily aspirin who, yesterday evening, began to have hematochezia, without abdominal pain, but associated with some lightheadedness.   He thinks he had about 10 episodes all together. The blood was bright red in color.He has not passed any blood for approximately the past 6 or 8 hours.   His hemoglobin has dropped about 5 g overnightsince admission, from 15.7 to a current level of 10.9.  The patient has chronic renal insufficiency with creatinine around 2, and his current BUN is similar to what it was a year ago.   He did have everal episodes of emesis while having the hematochezia, including once when he stood up to have orthostatic vital signs taken. It sounds as though this may have been vagally-induced. It may have had some dark coffee ground like material, but it was certainly not frank blood or clots.  The patient has a history of colon polyps and has had partial colonoscopy by Dr. Watt Climes on a couple of occasions, most recently in 2010. At that time, it was possible to reach the ascending colon but not the cecum, in view of the patient's morbid obesity. He is known to have extensive diverticulosis, at least to the region of the splenic flexure. A virtual colonoscopy in 2011 did not visualize any masses although portions of the sigmoid colon were not well distended for optimal visualization.  At this time, the patient feels quite well.   Past Medical History  Diagnosis Date  . Toxic goiter   . Hypertension   . Hyperlipidemia   . Diabetes mellitus without complication ([place])   . Coronary artery disease     Past Surgical History  Procedure Laterality Date  . Abdominal aortic aneurysm repair    . Cardiac bypass    . Coronary artery bypass graft      Prior to Admission medications    Medication Sig Start Date End Date Taking? Authorizing Provider  aspirin 325 MG tablet Take 325 mg by mouth daily.     Yes Historical Provider, MD  calcitRIOL (ROCALTROL) 0.25 MCG capsule Take 0.5-0.75 mcg by mouth daily. Alternate take 2 capsules one day then 3 capsules the next day   Yes Historical Provider, MD  furosemide (LASIX) 80 MG tablet Take 80-160 mg by mouth See admin instructions. Take 2 tablets every morning and take 1 tablet at noon   Yes Historical Provider, MD  insulin aspart protamine-insulin aspart (NOVOLOG 70/30) (70-30) 100 UNIT/ML injection Inject 35-40 Units into the skin 2 (two) times daily with a meal. Use 35 units every morning and use 40 units every evening   Yes Historical Provider, MD  levothyroxine (SYNTHROID, LEVOTHROID) 75 MCG tablet Take 75 mcg by mouth daily before breakfast.   Yes Historical Provider, MD  metoprolol succinate (TOPROL-XL) 25 MG 24 hr tablet Take 1 tablet (25 mg total) by mouth daily. 07/03/15  Yes Belva Crome, MD  niacin (NIASPAN) 1000 MG CR tablet Take 1,000 mg by mouth 2 (two) times daily.   Yes Historical Provider, MD  potassium chloride (K-DUR,KLOR-CON) 10 MEQ tablet Take 10 mEq by mouth 2 (two) times daily.   Yes Historical Provider, MD  sertraline (ZOLOFT) 50 MG tablet Take 50 mg by mouth daily.   Yes Historical Provider, MD    Current Facility-Administered Medications  Medication Dose  Route Frequency Provider Last Rate Last Dose  . 0.9 %  sodium chloride infusion  1,000 mL Intravenous Continuous Delora Fuel, MD 0000000 mL/hr at 03/31/16 0838 1,000 mL at 03/31/16 0838  . acetaminophen (TYLENOL) tablet 650 mg  650 mg Oral Q6H PRN Ivor Costa, MD       Or  . acetaminophen (TYLENOL) suppository 650 mg  650 mg Rectal Q6H PRN Ivor Costa, MD      . insulin aspart (novoLOG) injection 0-9 Units  0-9 Units Subcutaneous TID WC Ivor Costa, MD   1 Units at 03/31/16 1157  . levothyroxine (SYNTHROID, LEVOTHROID) tablet 75 mcg  75 mcg Oral QAC breakfast Ivor Costa, MD   75 mcg at 03/31/16 0903  . metoprolol succinate (TOPROL-XL) 24 hr tablet 25 mg  25 mg Oral Daily Ivor Costa, MD   25 mg at 03/31/16 0940  . morphine 2 MG/ML injection 1 mg  1 mg Intravenous Q3H PRN Ivor Costa, MD      . niacin (NIASPAN) CR tablet 1,000 mg  1,000 mg Oral BID Ivor Costa, MD   1,000 mg at 03/31/16 0940  . ondansetron (ZOFRAN) injection 4 mg  4 mg Intravenous Q8H PRN Ivor Costa, MD      . pantoprazole (PROTONIX) injection 40 mg  40 mg Intravenous Q12H Ivor Costa, MD   40 mg at 03/31/16 0940  . sertraline (ZOLOFT) tablet 50 mg  50 mg Oral Daily Ivor Costa, MD   50 mg at 03/31/16 0940    Allergies as of 03/30/2016  . (No Known Allergies)    Family History  Problem Relation Age of Onset  . Stroke Mother   . Stroke Father   . Heart attack Father   . Diabetes Sister   . Heart attack Sister   . Diabetes Brother   . Heart attack Brother   . Diabetes Brother   . Diabetes Brother   . Heart attack Brother     Social History   Social History  . Marital Status: Married    Spouse Name: N/A  . Number of Children: N/A  . Years of Education: N/A   Occupational History  . Not on file.   Social History Main Topics  . Smoking status: Former Smoker -- 1.00 packs/day    Quit date: 03/31/1996  . Smokeless tobacco: Never Used  . Alcohol Use: No  . Drug Use: No  . Sexual Activity: Yes    Birth Control/ Protection: None   Other Topics Concern  . Not on file   Social History Narrative    Review of Systems: generally negative. No prodromal upper tract dyspeptic symptoms. No chest pain, no shortness of breath; lightheadedness as per history of present illness. No urinary symptoms. No enlarged lymph nodes. Does have chronic lower extremity edema.  Physical Exam: Vital signs in last 24 hours: Temp:  [97.7 F (36.5 C)-98.2 F (36.8 C)] 98.2 F (36.8 C) (04/23 0819) Pulse Rate:  [67-92] 80 (04/23 0819) Resp:  [16-29] 16 (04/23 0819) BP: (88-135)/(52-82) 135/64 mmHg  (04/23 0819) SpO2:  [93 %-100 %] 97 % (04/23 0819) Weight:  [105.461 kg (232 lb 8 oz)-107.531 kg (237 lb 1 oz)] 105.461 kg (232 lb 8 oz) (04/23 0819) Last BM Date: 03/31/16 General: pleasant, alert, coherent, cooperative Caucasian male in no distress whatsoever, morbidly obese. Head:  Normocephalic and atraumatic. Eyes:  Sclera clear, no icterus.   Mouth:   No ulcerations or lesions.  Oropharynx pink & moist. Neck:  No masses or thyromegaly. Lungs:  Clear throughout to auscultation.   No wheezes, crackles, or rhonchi. No evident respiratory distress. Heart:   Regular rate and rhythm; no murmurs, clicks, rubs,  or gallops. Abdomen: severely obese, no mass or tenderness Msk:   Symmetrical without gross deformities. Pulses:  Normal radial pulse is noted. Extremities: chronic nonpitting lower extremity edema, difficult to differentiate from his obesity Neurologic:  Alert and coherent;  grossly normal neurologically. Skin:  Intact without significant lesions or rashes. Significant pallor of the palms. Cervical Nodes:  No significant cervical adenopathy. Psych:   Alert and cooperative. Normal mood and affect.  Intake/Output from previous day:   Intake/Output this shift: Total I/O In: 670.8 [I.V.:670.8] Out: -   Lab Results:  Recent Labs  03/31/16 0056 03/31/16 0306 03/31/16 1024  WBC 14.9* 15.5* 10.6*  HGB 13.4 12.5* 10.9*  HCT 40.6 39.3 34.1*  PLT 160 156 132*   BMET  Recent Labs  03/30/16 2248 03/31/16 0306  NA 145 144  K 3.0* 3.6  CL 104 110  CO2 26 22  GLUCOSE 93 155*  BUN 31* 32*  CREATININE 2.39* 2.15*  CALCIUM 9.6 8.5*   LFT  Recent Labs  03/30/16 2248  PROT 6.2*  ALBUMIN 3.4*  AST 18  ALT 19  ALKPHOS 63  BILITOT 0.4   PT/INR  Recent Labs  03/30/16 2248  LABPROT 14.2  INR 1.08    Studies/Results: No results found.  Impression: 1. Multiple episodes of hematochezia, quiescent for the past several hours 2. Significant acute  posthemorrhagic anemia (5 g drop in hemoglobin so far) 3. Dark emesis with recent aspirin exposure but no significant rise in BUN, doubt upper tract source of hematochezia   Plan: Monitoring and supportive care.   At the present time, I do not feel there is sufficient concern about the possibility of an upper tract source for the patient's bleeding to necessitate endoscopic evaluation.   There does not appear to be active bleeding at this time to indicate the need for a bleeding scan or interventional radiology.   The role of colonoscopy for acute lower GI bleeding in a patient with known diverticulosis is relatively limited, both diagnostically and therapeutically, especially in the absence of pain to suggest ischemic colitis or other clinical indicators do suggest alternative causes for the patient's bleeding. However, colonoscopy is certainly worth considering if the patient has persistent or recurrent bleeding.     [place] V  03/31/2016, 3:20 PM   Pager [phone] If no answer or after 5 PM call [phone]

## 2016-04-01 DIAGNOSIS — I25810 Atherosclerosis of coronary artery bypass graft(s) without angina pectoris: Secondary | ICD-10-CM

## 2016-04-01 DIAGNOSIS — Z794 Long term (current) use of insulin: Secondary | ICD-10-CM

## 2016-04-01 DIAGNOSIS — N184 Chronic kidney disease, stage 4 (severe): Secondary | ICD-10-CM

## 2016-04-01 DIAGNOSIS — I10 Essential (primary) hypertension: Secondary | ICD-10-CM

## 2016-04-01 DIAGNOSIS — E0821 Diabetes mellitus due to underlying condition with diabetic nephropathy: Secondary | ICD-10-CM

## 2016-04-01 LAB — BASIC METABOLIC PANEL
Anion gap: 11 (ref 5–15)
BUN: 29 mg/dL — ABNORMAL HIGH (ref 6–20)
CO2: 20 mmol/L — ABNORMAL LOW (ref 22–32)
Calcium: 7.6 mg/dL — ABNORMAL LOW (ref 8.9–10.3)
Chloride: 112 mmol/L — ABNORMAL HIGH (ref 101–111)
Creatinine, Ser: 1.91 mg/dL — ABNORMAL HIGH (ref 0.61–1.24)
GFR calc Af Amer: 37 mL/min — ABNORMAL LOW (ref >60)
GFR calc non Af Amer: 32 mL/min — ABNORMAL LOW (ref >60)
Glucose, Bld: 179 mg/dL — ABNORMAL HIGH (ref 65–99)
Potassium: 3.7 mmol/L (ref 3.5–5.1)
Sodium: 143 mmol/L (ref 135–145)

## 2016-04-01 LAB — CBC
HCT: 27.9 % — ABNORMAL LOW (ref 39.0–52.0)
HCT: 27.9 % — ABNORMAL LOW (ref 39.0–52.0)
HCT: 29.8 % — ABNORMAL LOW (ref 39.0–52.0)
HCT: 28.5 % — ABNORMAL LOW (ref 39.0–52.0)
Hemoglobin: 8.9 g/dL — ABNORMAL LOW (ref 13.0–17.0)
Hemoglobin: 8.7 g/dL — ABNORMAL LOW (ref 13.0–17.0)
Hemoglobin: 9.3 g/dL — ABNORMAL LOW (ref 13.0–17.0)
Hemoglobin: 9 g/dL — ABNORMAL LOW (ref 13.0–17.0)
MCH: 30.1 pg (ref 26.0–34.0)
MCH: 29.7 pg (ref 26.0–34.0)
MCH: 29.8 pg (ref 26.0–34.0)
MCH: 30 pg (ref 26.0–34.0)
MCHC: 31.9 g/dL (ref 30.0–36.0)
MCHC: 31.2 g/dL (ref 30.0–36.0)
MCHC: 31.2 g/dL (ref 30.0–36.0)
MCHC: 31.6 g/dL (ref 30.0–36.0)
MCV: 94.3 fL (ref 78.0–100.0)
MCV: 95.2 fL (ref 78.0–100.0)
MCV: 95.5 fL (ref 78.0–100.0)
MCV: 95 fL (ref 78.0–100.0)
Platelets: 97 10*3/uL — ABNORMAL LOW (ref 150–400)
Platelets: 97 10*3/uL — ABNORMAL LOW (ref 150–400)
Platelets: 127 10*3/uL — ABNORMAL LOW (ref 150–400)
Platelets: 103 10*3/uL — ABNORMAL LOW (ref 150–400)
RBC: 2.96 MIL/uL — ABNORMAL LOW (ref 4.22–5.81)
RBC: 2.93 MIL/uL — ABNORMAL LOW (ref 4.22–5.81)
RBC: 3.12 MIL/uL — ABNORMAL LOW (ref 4.22–5.81)
RBC: 3 MIL/uL — ABNORMAL LOW (ref 4.22–5.81)
RDW: 14.4 % (ref 11.5–15.5)
RDW: 14.3 % (ref 11.5–15.5)
RDW: 14.5 % (ref 11.5–15.5)
RDW: 14.3 % (ref 11.5–15.5)
WBC: 7.3 10*3/uL (ref 4.0–10.5)
WBC: 6.8 10*3/uL (ref 4.0–10.5)
WBC: 8.4 10*3/uL (ref 4.0–10.5)
WBC: 6.1 10*3/uL (ref 4.0–10.5)

## 2016-04-01 LAB — GLUCOSE, CAPILLARY
Glucose-Capillary: 138 mg/dL — ABNORMAL HIGH (ref 65–99)
Glucose-Capillary: 184 mg/dL — ABNORMAL HIGH (ref 65–99)
Glucose-Capillary: 173 mg/dL — ABNORMAL HIGH (ref 65–99)
Glucose-Capillary: 194 mg/dL — ABNORMAL HIGH (ref 65–99)

## 2016-04-01 LAB — HEMOGLOBIN A1C
Hgb A1c MFr Bld: 7.7 % — ABNORMAL HIGH (ref 4.8–5.6)
Mean Plasma Glucose: 174 mg/dL

## 2016-04-01 MED ORDER — SODIUM CHLORIDE 0.9 % IV SOLN
INTRAVENOUS | Status: DC
  Filled 2016-04-01: qty 1000

## 2016-04-01 MED ORDER — SODIUM CHLORIDE 0.9 % IV SOLN
INTRAVENOUS | Status: DC
  Administered 2016-04-01 – 2016-04-02 (×2): via INTRAVENOUS

## 2016-04-01 NOTE — Clinical Documentation Improvement (Signed)
  Internal Medicine  Can the diagnosis of renal failure be further specified? Please document response in next progress note. Thank you!   Acute Renal Failure/Acute Kidney Injury  Acute on Chronic Renal Failure - stage 3  Chronic Renal Failure - stage 3 only  Other  Clinically Undetermined  Document any associated diagnoses/conditions.  Supporting Information:  Creatinine has dropped from 2.39 on admission to 1.91 this morning  This is a 0.48 change in Creatinine over a 48 hour timeframe  Please exercise your independent, professional judgment when responding. A specific answer is not anticipated or expected.  Thank You,  Zoila Shutter RN, BSN, [place] [phone]; Cell: [phone]

## 2016-04-01 NOTE — Evaluation (Signed)
Physical Therapy Evaluation Patient Details Name: [name] MRN: [id number] DOB: [birth year] Today's Date: 04/01/2016   History of Present Illness   82 y.o. male with medical history significant of AAA (s/p repair by Dr. Scot Dock), hypertension, hyperlipidemia, diabetes mellitus, hypothyroidism, depression, COPD, s/p CABG, chronic kidney disease-stage IV, chronic leg edema, who presents with GI bleeding  Clinical Impression   Patient evaluated by Physical Therapy with no further acute PT needs identified. All education has been completed and the patient has no further questions.  See below for any follow-up Physical Therapy or equipment needs. PT is signing off. Thank you for this referral.     Follow Up Recommendations No PT follow up    Equipment Recommendations  None recommended by PT    Recommendations for Other Services       Precautions / Restrictions Precautions Precautions: None      Mobility  Bed Mobility                  Transfers   Equipment used: None Transfers: Sit to/from Stand Sit to Stand: Modified independent (Device/Increase time)         General transfer comment: No difficulties with sit<>stand  Ambulation/Gait Ambulation/Gait assistance: Supervision;Modified independent (Device/Increase time) Ambulation Distance (Feet): 470 Feet Assistive device: None (and pushing IV pole) Gait Pattern/deviations: WFL(Within Functional Limits) Gait velocity: aproaching WNL   General Gait Details: supervision intially, progressing to independence and modified independence; noted some slight shortness of breath, O2 sats 98-100% on room air; pushed IV pole without difficulty  Stairs            Wheelchair Mobility    Modified Rankin (Stroke Patients Only)       Balance                                             Pertinent Vitals/Pain Pain Assessment: No/denies pain    Home Living Family/patient expects to be discharged  to:: Private residence Living Arrangements: Spouse/significant other Available Help at Discharge: Family Type of Home: House Home Access: Stairs to enter   Technical brewer of Steps: 2 Home Layout: One level Home Equipment: Electronics engineer Comments: drives    Prior Function Level of Independence: Independent               Hand Dominance   Dominant Hand: Right    Extremity/Trunk Assessment   Upper Extremity Assessment: Defer to OT evaluation           Lower Extremity Assessment: Overall WFL for tasks assessed         Communication   Communication: No difficulties  Cognition Arousal/Alertness: Awake/alert Behavior During Therapy: WFL for tasks assessed/performed Overall Cognitive Status: Within Functional Limits for tasks assessed                      General Comments      Exercises        Assessment/Plan    PT Assessment Patent does not need any further PT services  PT Diagnosis Difficulty walking   PT Problem List    PT Treatment Interventions     PT Goals (Current goals can be found in the Care Plan section) Acute Rehab PT Goals Patient Stated Goal: to return home PT Goal Formulation: All assessment and education complete, DC therapy Potential to Achieve Goals: Good  Frequency     Barriers to discharge        Co-evaluation               End of Session   Activity Tolerance: Patient tolerated treatment well Patient left: in chair;with call bell/phone within reach Nurse Communication: Mobility status         Time: [id number] PT Time Calculation (min) (ACUTE ONLY): 21 min   Charges:   PT Evaluation $PT Eval Low Complexity: 1 Procedure     PT G CodesQuin Hoop 04/01/2016, 4:19 PM  [place]oney [place], [place]  Acute Rehabilitation Services Pager [phone] Office [phone]

## 2016-04-01 NOTE — Progress Notes (Signed)
Patient ID: [name], male   DOB: 12/13/1933, 82 y.o.   MRN: [id number] [facility] Gastroenterology Progress Note  [name] 82 y.o. 09/17/1934   Subjective: Reports 2 episodes of dark red blood from rectum this morning. Denies abdominal pain. No emesis since admit. Feels ok.   Objective: Vital signs in last 24 hours: Filed Vitals:   04/01/16 0602 04/01/16 0846  BP: 127/57 118/52  Pulse: 64 67  Temp: 98 F (36.7 C) 98.3 F (36.8 C)  Resp: 18 18    Physical Exam: Gen: alert, no acute distress, elderly, obese HEENT: anicteric sclera CV: RRR Chest: CTA B Abd: soft, nontender, nondistended, +BS Ext: no edema  Lab Results:  Recent Labs  03/31/16 0306 04/01/16 0547  NA 144 143  K 3.6 3.7  CL 110 112*  CO2 22 20*  GLUCOSE 155* 179*  BUN 32* 29*  CREATININE 2.15* 1.91*  CALCIUM 8.5* 7.6*  MG 1.5*  --     Recent Labs  03/30/16 2248  AST 18  ALT 19  ALKPHOS 63  BILITOT 0.4  PROT 6.2*  ALBUMIN 3.4*    Recent Labs  03/31/16 2227 04/01/16 0547  WBC 8.4 7.3  HGB 9.8* 8.9*  HCT 30.5* 27.9*  MCV 94.1 94.3  PLT 124* 97*    Recent Labs  03/30/16 2248  LABPROT 14.2  INR 1.08      Assessment/Plan: Painless hematochezia likely diverticular in origin. Hgb 8.9. Continue supportive care. Keep on clear liquids today without advancing. If bleeding persists, then may need to do an updated colonoscopy. Will follow.   [place] C. 04/01/2016, 8:57 AM  Pager [phone]  If no answer or after 5 PM call [phone]

## 2016-04-01 NOTE — Evaluation (Signed)
Occupational Therapy Evaluation Patient Details Name: [name] MRN: [id number] DOB: 1934/01/05 Today's Date: 04/01/2016    History of Present Illness  82 y.o. male with medical history significant of AAA (s/p repair by Dr. Scot Dock), hypertension, hyperlipidemia, diabetes mellitus, hypothyroidism, depression, COPD, s/p CABG, chronic kidney disease-stage IV, chronic leg edema, who presents with GI bleeding   Clinical Impression   PT admitted with GIB. Pt currently with functional limitiations due to the deficits listed below (see OT problem list). Pt reports "feeling good" when asked about current status compared to baseline.  Pt will benefit from skilled OT to increase their independence and safety with adls and balance to allow discharge home without follow up. Pt appears to be close to baseline at this time based on this session. OT to provide one more visit to address energy conservation as precursor to return home at maximized independence.      Follow Up Recommendations  No OT follow up    Equipment Recommendations  None recommended by OT    Recommendations for Other Services       Precautions / Restrictions Precautions Precautions: None      Mobility Bed Mobility Overal bed mobility: Modified Independent (HOB elevated)                Transfers Overall transfer level: Needs assistance   Transfers: Sit to/from Stand Sit to Stand: Supervision              Balance                                            ADL Overall ADL's : Needs assistance/impaired Eating/Feeding: Independent;Sitting   Grooming: Wash/dry hands;Wash/dry face;Oral care;Min guard;Standing (sink level) Grooming Details (indicate cue type and reason): pt able to open all containers and complete task at sink level.                  Toilet Transfer: Supervision/safety;Ambulation;Regular Toilet   Toileting- Clothing Manipulation and Hygiene: Supervision/safety         General ADL Comments: Pt supine on arrival and agreeable to OOB to chair. pt reports "its nice to sit up"     Vision Vision Assessment?: No apparent visual deficits   Perception     Praxis      Pertinent Vitals/Pain Pain Assessment: No/denies pain     Hand Dominance Right   Extremity/Trunk Assessment Upper Extremity Assessment Upper Extremity Assessment: Overall WFL for tasks assessed   Lower Extremity Assessment Lower Extremity Assessment: Defer to PT evaluation   Cervical / Trunk Assessment Cervical / Trunk Assessment: Kyphotic   Communication Communication Communication: No difficulties   Cognition Arousal/Alertness: Awake/alert Behavior During Therapy: WFL for tasks assessed/performed Overall Cognitive Status: Within Functional Limits for tasks assessed                     General Comments       Exercises       Shoulder Instructions      Home Living Family/patient expects to be discharged to:: Private residence Living Arrangements: Spouse/significant other Available Help at Discharge: Family Type of Home: House Home Access: Stairs to enter Technical brewer of Steps: 2   Home Layout: One level     Bathroom Shower/Tub: Teacher, early years/pre: Standard     Home Equipment: Civil engineer, contracting   Additional Comments:  drives      Prior Functioning/Environment Level of Independence: Independent             OT Diagnosis: Generalized weakness   OT Problem List: Decreased strength;Decreased activity tolerance;Impaired balance (sitting and/or standing);Decreased safety awareness;Decreased knowledge of use of DME or AE;Cardiopulmonary status limiting activity;Decreased knowledge of precautions;Obesity   OT Treatment/Interventions: Self-care/ADL training;Therapeutic exercise;DME and/or AE instruction;Energy conservation;Therapeutic activities;Balance training;Patient/family education    OT Goals(Current goals can be found  in the care plan section) Acute Rehab OT Goals Patient Stated Goal: to return home OT Goal Formulation: With patient Time For Goal Achievement: 04/15/16 Potential to Achieve Goals: Good  OT Frequency: Min 2X/week   Barriers to D/C:            Co-evaluation              End of Session Equipment Utilized During Treatment: Gait belt Nurse Communication: Mobility status;Precautions  Activity Tolerance: Patient tolerated treatment well Patient left: in chair;with call bell/phone within reach;with chair alarm set   Time: 1021-1050 OT Time Calculation (min): 29 min Charges:  OT General Charges $OT Visit: 1 Procedure OT Evaluation $OT Eval Moderate Complexity: 1 Procedure OT Treatments $Self Care/Home Management : 8-22 mins G-Codes:    Peri Maris 04/20/2016, 11:01 AM   Jeri Modena   OTR/L [phone] Office: [phone] .

## 2016-04-01 NOTE — Progress Notes (Signed)
[facility]  PROGRESS NOTE  [name] [id number] DOB: 12/30/1933 DOA: 03/30/2016 PCP: Gennette Pac, MD  Outpatient specialist: Dr Watt Climes GI  Brief HPI:  82 y.o. male with medical history significant of AAA (s/p repair by Dr. Scot Dock), hypertension, hyperlipidemia, diabetes mellitus, hypothyroidism, depression, COPD, s/p CABG, chronic kidney disease-stage IV, chronic leg edema, who presents with GI bleeding Principal Problem:   GIB (gastrointestinal bleeding) Active Problems:   Abdominal aortic aneurysm (HCC)   CAD (coronary artery disease) of artery bypass graft   Essential hypertension   Hyperlipidemia   Diabetes mellitus due to underlying condition with diabetic nephropathy (HCC)   Chronic kidney disease, stage IV (severe) (HCC)   Assessment/Plan:  Lower GI bleed [facility] was consulted and discussed with Dr. Cristina Gong, and he says that patient has history of diverticulosis seen on colonoscopy in 2011. Possible colonoscopy during this admission  ? Coffee-ground emesis- patient had one questionable coffee-ground emesis, continue Protonix. Denies any further emesis in the hospital.  CAD- hold aspirin, continue metoprolol  Hypertension- blood pressure stable, continue metoprolol  Diabetes mellitus- hold insulin 70/30 as patient nothing by mouth. Continue sliding scale insulin with NovoLog.  Glucose is well controlled.   CKD stage III- patient's baseline creatinine 2.01, creatinine on admission 2.39. Today yet and is 1.91    DVT prophylaxis: SCD Code Status: Full code Family Communication: No family present at bedside Disposition Plan: Pending improvement in rectal bleeding   Consultants:  [facility]  Procedures:  None   Antibiotics:  None  Subjective: This morning patient says that he had two bloody bowel moments   Objective: Filed Vitals:   03/31/16 2057 04/01/16 0500 04/01/16 0602 04/01/16 0846  BP: 125/56  127/57 118/52  Pulse: 64  64 67   Temp: 98.3 F (36.8 C)  98 F (36.7 C) 98.3 F (36.8 C)  TempSrc: Oral  Oral Oral  Resp: 18  18 18   Height:      Weight:  101.334 kg (223 lb 6.4 oz)    SpO2: 96%  96% 97%    Intake/Output Summary (Last 24 hours) at 04/01/16 1226 Last data filed at 04/01/16 1000  Gross per 24 hour  Intake   2082 ml  Output      0 ml  Net   2082 ml   Filed Weights   03/30/16 2234 03/31/16 0819 04/01/16 0500  Weight: 107.531 kg (237 lb 1 oz) 105.461 kg (232 lb 8 oz) 101.334 kg (223 lb 6.4 oz)    Examination:  General exam: Appears calm and comfortable  Respiratory system: Clear to auscultation. Respiratory effort normal. Cardiovascular system: S1 & S2 heard, RRR. No JVD, murmurs, rubs, gallops or clicks. No pedal edema. Gastrointestinal system: Abdomen is nondistended, soft and nontender. No organomegaly or masses felt. Normal bowel sounds heard. Central nervous system: Alert and oriented. No focal neurological deficits. Extremities: Symmetric 5 x 5 power. Skin: No rashes, lesions or ulcers Psychiatry: Judgement and insight appear normal. Mood & affect appropriate.    Data Reviewed: I have personally reviewed following labs and imaging studies Basic Metabolic Panel:  Recent Labs Lab 03/30/16 2248 03/31/16 0306 04/01/16 0547  NA 145 144 143  K 3.0* 3.6 3.7  CL 104 110 112*  CO2 26 22 20*  GLUCOSE 93 155* 179*  BUN 31* 32* 29*  CREATININE 2.39* 2.15* 1.91*  CALCIUM 9.6 8.5* 7.6*  MG  --  1.5*  --    Liver Function Tests:  Recent Labs Lab 03/30/16  2248  AST 18  ALT 19  ALKPHOS 63  BILITOT 0.4  PROT 6.2*  ALBUMIN 3.4*   No results for input(s): LIPASE, AMYLASE in the last 168 hours. No results for input(s): AMMONIA in the last 168 hours. CBC:  Recent Labs Lab 03/31/16 1024 03/31/16 1537 03/31/16 2227 04/01/16 0547 04/01/16 0938  WBC 10.6* 7.8 8.4 7.3 6.8  HGB 10.9* 10.3* 9.8* 8.9* 8.7*  HCT 34.1* 32.6* 30.5* 27.9* 27.9*  MCV 93.4 93.9 94.1 94.3 95.2  PLT 132*  119* 124* 97* 97*   Cardiac Enzymes: No results for input(s): CKTOTAL, CKMB, CKMBINDEX, TROPONINI in the last 168 hours. BNP (last 3 results) No results for input(s): BNP in the last 8760 hours.  ProBNP (last 3 results) No results for input(s): PROBNP in the last 8760 hours.  CBG:  Recent Labs Lab 03/31/16 1140 03/31/16 1656 03/31/16 2054 04/01/16 0735 04/01/16 1143  GLUCAP 136* 142* 153* 138* 184*    No results found for this or any previous visit (from the past 240 hour(s)).   Studies: No results found.  Scheduled Meds: . insulin aspart  0-9 Units Subcutaneous TID WC  . levothyroxine  75 mcg Oral QAC breakfast  . metoprolol succinate  25 mg Oral Daily  . niacin  1,000 mg Oral BID  . pantoprazole (PROTONIX) IV  40 mg Intravenous Q12H  . sertraline  50 mg Oral Daily   Continuous Infusions: . sodium chloride 1,000 mL (04/01/16 0841)       Time spent: 25 min    [facility] Pager [phone]. If 7PM-7AM, please contact night-coverage at [email or web address], Office  [phone]  password [email or web address] 04/01/2016, 12:26 PM  LOS: 1 day

## 2016-04-02 LAB — POCT PREGNANCY, URINE: Preg Test, Ur: POSITIVE — AB

## 2016-04-02 LAB — CBC
HCT: 27.7 % — ABNORMAL LOW (ref 39.0–52.0)
Hemoglobin: 9 g/dL — ABNORMAL LOW (ref 13.0–17.0)
MCH: 30.7 pg (ref 26.0–34.0)
MCHC: 32.5 g/dL (ref 30.0–36.0)
MCV: 94.5 fL (ref 78.0–100.0)
Platelets: 98 10*3/uL — ABNORMAL LOW (ref 150–400)
RBC: 2.93 MIL/uL — ABNORMAL LOW (ref 4.22–5.81)
RDW: 14.4 % (ref 11.5–15.5)
WBC: 7 10*3/uL (ref 4.0–10.5)

## 2016-04-02 LAB — GLUCOSE, CAPILLARY
Glucose-Capillary: 161 mg/dL — ABNORMAL HIGH (ref 65–99)
Glucose-Capillary: 149 mg/dL — ABNORMAL HIGH (ref 65–99)

## 2016-04-02 LAB — OCCULT BLOOD, POC DEVICE: Fecal Occult Bld: POSITIVE — AB

## 2016-04-02 MED ORDER — ASPIRIN 325 MG PO TABS: 325.0000 mg | ORAL_TABLET | Freq: Every day | ORAL | Status: DC

## 2016-04-02 NOTE — Progress Notes (Signed)
Renaye Rakers to be D/C'd Home per MD order.  Discussed prescriptions and follow up appointments with the patient. Prescriptions given to patient, medication list explained in detail. Pt verbalized understanding.    Medication List    TAKE these medications        aspirin 325 MG tablet  Take 1 tablet (325 mg total) by mouth daily.  Start taking on:  04/08/2016     calcitRIOL 0.25 MCG capsule  Commonly known as:  ROCALTROL  Take 0.5-0.75 mcg by mouth daily. Alternate take 2 capsules one day then 3 capsules the next day     furosemide 80 MG tablet  Commonly known as:  LASIX  Take 80-160 mg by mouth See admin instructions. Take 2 tablets every morning and take 1 tablet at noon     insulin aspart protamine- aspart (70-30) 100 UNIT/ML injection  Commonly known as:  NOVOLOG MIX 70/30  Inject 35-40 Units into the skin 2 (two) times daily with a meal. Use 35 units every morning and use 40 units every evening     levothyroxine 75 MCG tablet  Commonly known as:  SYNTHROID, LEVOTHROID  Take 75 mcg by mouth daily before breakfast.     metoprolol succinate 25 MG 24 hr tablet  Commonly known as:  TOPROL-XL  Take 1 tablet (25 mg total) by mouth daily.     niacin 1000 MG CR tablet  Commonly known as:  NIASPAN  Take 1,000 mg by mouth 2 (two) times daily.     potassium chloride 10 MEQ tablet  Commonly known as:  K-DUR,KLOR-CON  Take 10 mEq by mouth 2 (two) times daily.     sertraline 50 MG tablet  Commonly known as:  ZOLOFT  Take 50 mg by mouth daily.        Filed Vitals:   04/02/16 0818 04/02/16 0821  BP: 96/58 126/59  Pulse: 76 65  Temp: 97.6 F (36.4 C) 98.6 F (37 C)  Resp: 18 18    Skin clean, dry and intact without evidence of skin break down, no evidence of skin tears noted. IV catheter discontinued intact. Site without signs and symptoms of complications. Dressing and pressure applied. Pt denies pain at this time. No complaints noted.  An After Visit Summary was printed  and given to the patient. Patient escorted via [place], and D/C home via private auto.  Retta Mac BSN, RN

## 2016-04-02 NOTE — Progress Notes (Signed)
Subjective: No abdominal pain. No further bleeding.  Objective: Vital signs in last 24 hours: Temp:  [97.6 F (36.4 C)-98.7 F (37.1 C)] 98.6 F (37 C) (04/25 0821) Pulse Rate:  [56-76] 65 (04/25 0821) Resp:  [17-18] 18 (04/25 0821) BP: (96-133)/(50-76) 126/59 mmHg (04/25 0821) SpO2:  [95 %-100 %] 100 % (04/25 0821) Weight:  [104.917 kg (231 lb 4.8 oz)] 104.917 kg (231 lb 4.8 oz) (04/24 2053) Weight change: -0.544 kg (-1 lb 3.2 oz) Last BM Date: 04/01/16  PE: GEN:  NAD ABD:  Protuberant, soft  Lab Results: CBC    Component Value Date/Time   WBC 6.1 04/01/2016 2221   RBC 3.00* 04/01/2016 2221   HGB 9.0* 04/01/2016 2221   HCT 28.5* 04/01/2016 2221   PLT 103* 04/01/2016 2221   MCV 95.0 04/01/2016 2221   MCH 30.0 04/01/2016 2221   MCHC 31.6 04/01/2016 2221   RDW 14.3 04/01/2016 2221   CMP     Component Value Date/Time   NA 143 04/01/2016 0547   K 3.7 04/01/2016 0547   CL 112* 04/01/2016 0547   CO2 20* 04/01/2016 0547   GLUCOSE 179* 04/01/2016 0547   BUN 29* 04/01/2016 0547   CREATININE 1.91* 04/01/2016 0547   CREATININE 2.01* 05/11/2015 1339   CALCIUM 7.6* 04/01/2016 0547   PROT 6.2* 03/30/2016 2248   ALBUMIN 3.4* 03/30/2016 2248   AST 18 03/30/2016 2248   ALT 19 03/30/2016 2248   ALKPHOS 63 03/30/2016 2248   BILITOT 0.4 03/30/2016 2248   GFRNONAA 32* 04/01/2016 0547   GFRAA 37* 04/01/2016 0547   Assessment:  1.  Painless hematochezia.  Suspect diverticular bleeding.  No blood in stool, or bowel movement, since admission few days ago. 2.  Acute blood loss anemia.  Hgb stable.  Plan:  1.  Advance diet. 2.  If no further bleeding, could likely discharge home later today or tomorrow morning, with outpatient follow-up with [facility] [phone]. 3.  Will sign-off; please call with questions; thank you for the consultation.   Landry Dyke 04/02/2016, 11:41 AM   Pager [phone] If no answer or after 5 PM call [phone]

## 2016-04-02 NOTE — Discharge Summary (Signed)
Physician Discharge Summary  [name] [id number] DOB: 1934/08/22 DOA: 03/30/2016  PCP: Gennette Pac, MD  Admit date: 03/30/2016 Discharge date: 04/02/2016  Time spent: 25* minutes  Recommendations for Outpatient Follow-up:  1. Hold aspirin for one week, check with GI for further recommendations 2. Follow up GI in one week   Discharge Diagnoses:  Principal Problem:   GIB (gastrointestinal bleeding) Active Problems:   Abdominal aortic aneurysm (HCC)   CAD (coronary artery disease) of artery bypass graft   Essential hypertension   Hyperlipidemia   Diabetes mellitus due to underlying condition with diabetic nephropathy (HCC)   Chronic kidney disease, stage IV (severe) ([place])   Discharge Condition: Stable  Diet recommendation: regular diet  Filed Weights   03/31/16 0819 04/01/16 0500 04/01/16 2053  Weight: 105.461 kg (232 lb 8 oz) 101.334 kg (223 lb 6.4 oz) 104.917 kg (231 lb 4.8 oz)    History of present illness:  82 y.o. male with medical history significant of AAA (s/p repair by Dr. Scot Dock), hypertension, hyperlipidemia, diabetes mellitus, hypothyroidism, depression, COPD, s/p CABG, chronic kidney disease-stage IV, chronic leg edema, who presents with GI bleeding.   Hospital Course:  Lower GI bleed [facility] was consulted and discussed with Dr. Cristina Gong, and he says that patient has history of diverticulosis seen on colonoscopy in 2011. Possible colonoscopy during this admission  ? Coffee-ground emesis- patient had one questionable coffee-ground emesis,  continue Protonix. Denies any further emesis in the hospital. Will hold aspirin for one week. Will check with GI for further recommendations  CAD- hold aspirin, continue metoprolol  Hypertension- blood pressure stable, continue metoprolol  Diabetes mellitus- continue Insulin 70/30  CKD stage III- patient's baseline creatinine 2.01, creatinine on admission 2.39. Today creat  is 1.91    Procedures:  None    Consultations:  GI  Discharge Exam: Filed Vitals:   04/02/16 0818 04/02/16 0821  BP: 96/58 126/59  Pulse: 76 65  Temp: 97.6 F (36.4 C) 98.6 F (37 C)  Resp: 18 18    General: Appears in no acute distress Cardiovascular: s1S2 RRR Respiratory: Clear bilaterally  Discharge Instructions   Discharge Instructions    Diet - low sodium heart healthy    Complete by:  As directed      Increase activity slowly    Complete by:  As directed           Current Discharge Medication List    CONTINUE these medications which have CHANGED   Details  aspirin 325 MG tablet Take 1 tablet (325 mg total) by mouth daily.      CONTINUE these medications which have NOT CHANGED   Details  calcitRIOL (ROCALTROL) 0.25 MCG capsule Take 0.5-0.75 mcg by mouth daily. Alternate take 2 capsules one day then 3 capsules the next day    furosemide (LASIX) 80 MG tablet Take 80-160 mg by mouth See admin instructions. Take 2 tablets every morning and take 1 tablet at noon    insulin aspart protamine-insulin aspart (NOVOLOG 70/30) (70-30) 100 UNIT/ML injection Inject 35-40 Units into the skin 2 (two) times daily with a meal. Use 35 units every morning and use 40 units every evening    levothyroxine (SYNTHROID, LEVOTHROID) 75 MCG tablet Take 75 mcg by mouth daily before breakfast.    metoprolol succinate (TOPROL-XL) 25 MG 24 hr tablet Take 1 tablet (25 mg total) by mouth daily. Qty: 90 tablet, Refills: 3    niacin (NIASPAN) 1000 MG CR tablet Take 1,000 mg by mouth  2 (two) times daily.    potassium chloride (K-DUR,KLOR-CON) 10 MEQ tablet Take 10 mEq by mouth 2 (two) times daily.    sertraline (ZOLOFT) 50 MG tablet Take 50 mg by mouth daily.       No Known Allergies Follow-up Information    Follow up with Gennette Pac, MD In 2 weeks.   Specialty:  Family Medicine   Contact information:   [place] [address] [phone]       Follow up with [facility], MD.  Schedule an appointment as soon as possible for a visit in 1 week.   Specialty:  Gastroenterology   Contact information:   [id number] [place] [address] [phone]        The results of significant diagnostics from this hospitalization (including imaging, microbiology, ancillary and laboratory) are listed below for reference.    Significant Diagnostic Studies: No results found.  Microbiology: No results found for this or any previous visit (from the past 240 hour(s)).   Labs: Basic Metabolic Panel:  Recent Labs Lab 03/30/16 2248 03/31/16 0306 04/01/16 0547  NA 145 144 143  K 3.0* 3.6 3.7  CL 104 110 112*  CO2 26 22 20*  GLUCOSE 93 155* 179*  BUN 31* 32* 29*  CREATININE 2.39* 2.15* 1.91*  CALCIUM 9.6 8.5* 7.6*  MG  --  1.5*  --    Liver Function Tests:  Recent Labs Lab 03/30/16 2248  AST 18  ALT 19  ALKPHOS 63  BILITOT 0.4  PROT 6.2*  ALBUMIN 3.4*   No results for input(s): LIPASE, AMYLASE in the last 168 hours. No results for input(s): AMMONIA in the last 168 hours. CBC:  Recent Labs Lab 04/01/16 0547 04/01/16 0938 04/01/16 1526 04/01/16 2221 04/02/16 1154  WBC 7.3 6.8 8.4 6.1 7.0  HGB 8.9* 8.7* 9.3* 9.0* 9.0*  HCT 27.9* 27.9* 29.8* 28.5* 27.7*  MCV 94.3 95.2 95.5 95.0 94.5  PLT 97* 97* 127* 103* 98*     CBG:  Recent Labs Lab 04/01/16 1143 04/01/16 1617 04/01/16 2050 04/02/16 0822 04/02/16 1151  GLUCAP 184* 173* 194* 161* 149*       Signed:  Eleonore Chiquito S MD.  [facility] 04/02/2016, 3:21 PM

## 2016-04-03 LAB — TYPE AND SCREEN
ABO/RH(D): O NEG
Antibody Screen: NEGATIVE
Unit division: 0
Unit division: 0

## 2016-04-04 DIAGNOSIS — D500 Iron deficiency anemia secondary to blood loss (chronic): Secondary | ICD-10-CM | POA: Diagnosis not present

## 2016-04-04 DIAGNOSIS — K921 Melena: Secondary | ICD-10-CM | POA: Diagnosis not present

## 2016-04-04 DIAGNOSIS — Z86010 Personal history of colonic polyps: Secondary | ICD-10-CM | POA: Diagnosis not present

## 2016-04-11 DIAGNOSIS — K3189 Other diseases of stomach and duodenum: Secondary | ICD-10-CM | POA: Diagnosis not present

## 2016-04-11 DIAGNOSIS — K920 Hematemesis: Secondary | ICD-10-CM | POA: Diagnosis not present

## 2016-04-11 DIAGNOSIS — D500 Iron deficiency anemia secondary to blood loss (chronic): Secondary | ICD-10-CM | POA: Diagnosis not present

## 2016-04-11 DIAGNOSIS — K449 Diaphragmatic hernia without obstruction or gangrene: Secondary | ICD-10-CM | POA: Diagnosis not present

## 2016-04-15 ENCOUNTER — Telehealth: Payer: Self-pay | Admitting: Interventional Cardiology

## 2016-04-15 NOTE — Telephone Encounter (Signed)
Pt was in hospital with Diverticulitis, Dr. Watt Climes wanted to know if ok to take baby [name] and Prilosec pls call [phone]

## 2016-04-15 NOTE — Telephone Encounter (Signed)
Will route to Dr.Smith to review and advise

## 2016-04-16 NOTE — Telephone Encounter (Signed)
He should be on 81 mg aspirin. No problem with Prilosec

## 2016-04-17 DIAGNOSIS — D500 Iron deficiency anemia secondary to blood loss (chronic): Secondary | ICD-10-CM | POA: Diagnosis not present

## 2016-04-17 DIAGNOSIS — K922 Gastrointestinal hemorrhage, unspecified: Secondary | ICD-10-CM | POA: Diagnosis not present

## 2016-04-17 NOTE — Telephone Encounter (Signed)
lmom with Dr.Smith's response below. Pt is to call back if any questions or concerns

## 2016-04-23 DIAGNOSIS — N184 Chronic kidney disease, stage 4 (severe): Secondary | ICD-10-CM | POA: Diagnosis not present

## 2016-04-23 DIAGNOSIS — Z8679 Personal history of other diseases of the circulatory system: Secondary | ICD-10-CM | POA: Diagnosis not present

## 2016-04-23 DIAGNOSIS — I129 Hypertensive chronic kidney disease with stage 1 through stage 4 chronic kidney disease, or unspecified chronic kidney disease: Secondary | ICD-10-CM | POA: Diagnosis not present

## 2016-04-23 DIAGNOSIS — I2510 Atherosclerotic heart disease of native coronary artery without angina pectoris: Secondary | ICD-10-CM | POA: Diagnosis not present

## 2016-04-23 DIAGNOSIS — I714 Abdominal aortic aneurysm, without rupture: Secondary | ICD-10-CM | POA: Diagnosis not present

## 2016-04-23 DIAGNOSIS — E782 Mixed hyperlipidemia: Secondary | ICD-10-CM | POA: Diagnosis not present

## 2016-04-23 DIAGNOSIS — N2581 Secondary hyperparathyroidism of renal origin: Secondary | ICD-10-CM | POA: Diagnosis not present

## 2016-04-23 DIAGNOSIS — E118 Type 2 diabetes mellitus with unspecified complications: Secondary | ICD-10-CM | POA: Diagnosis not present

## 2016-04-23 DIAGNOSIS — E8770 Fluid overload, unspecified: Secondary | ICD-10-CM | POA: Diagnosis not present

## 2016-04-23 DIAGNOSIS — D631 Anemia in chronic kidney disease: Secondary | ICD-10-CM | POA: Diagnosis not present

## 2016-04-23 DIAGNOSIS — E1129 Type 2 diabetes mellitus with other diabetic kidney complication: Secondary | ICD-10-CM | POA: Diagnosis not present

## 2016-04-26 ENCOUNTER — Encounter: Payer: Self-pay | Admitting: Interventional Cardiology

## 2016-05-01 DIAGNOSIS — N390 Urinary tract infection, site not specified: Secondary | ICD-10-CM | POA: Diagnosis not present

## 2016-05-02 ENCOUNTER — Other Ambulatory Visit: Payer: Self-pay | Admitting: Interventional Cardiology

## 2016-05-10 ENCOUNTER — Ambulatory Visit (INDEPENDENT_AMBULATORY_CARE_PROVIDER_SITE_OTHER): Payer: PPO | Admitting: Interventional Cardiology

## 2016-05-10 ENCOUNTER — Encounter: Payer: Self-pay | Admitting: Interventional Cardiology

## 2016-05-10 VITALS — BP 112/54 | HR 76 | Ht 70.0 in | Wt 233.0 lb

## 2016-05-10 DIAGNOSIS — I25810 Atherosclerosis of coronary artery bypass graft(s) without angina pectoris: Secondary | ICD-10-CM

## 2016-05-10 DIAGNOSIS — E7800 Pure hypercholesterolemia, unspecified: Secondary | ICD-10-CM | POA: Diagnosis not present

## 2016-05-10 DIAGNOSIS — E785 Hyperlipidemia, unspecified: Secondary | ICD-10-CM | POA: Diagnosis not present

## 2016-05-10 DIAGNOSIS — I714 Abdominal aortic aneurysm, without rupture, unspecified: Secondary | ICD-10-CM

## 2016-05-10 DIAGNOSIS — E1165 Type 2 diabetes mellitus with hyperglycemia: Secondary | ICD-10-CM | POA: Diagnosis not present

## 2016-05-10 DIAGNOSIS — E890 Postprocedural hypothyroidism: Secondary | ICD-10-CM | POA: Diagnosis not present

## 2016-05-10 DIAGNOSIS — I10 Essential (primary) hypertension: Secondary | ICD-10-CM | POA: Diagnosis not present

## 2016-05-10 DIAGNOSIS — K921 Melena: Secondary | ICD-10-CM

## 2016-05-10 NOTE — Progress Notes (Signed)
Cardiology Office Note    Date:  05/10/2016   ID:  [name], DOB September 05, 1934, MRN [id number]  PCP:  Roy Pac, MD  Cardiologist: Roy Grooms, MD   Chief Complaint  Patient presents with  . Coronary Artery Disease  . Atrial Fibrillation    History of Present Illness:  [name] is a 82 y.o. male who presents for follow-up of coronary artery disease, abdominal aortic aneurysm, hypertension, review his coronary bypass grafting, and recent lower GI bleeding possibly from diverticulosis.   [name] was recently hospitalized with bright red blood per act him. Subsequently an upper GI endoscopy has been performed and was unrevealing. He is known to have diverticulitis. Bleeding has not recurred. During the hospital stay, prior to the hospital stay and subsequently he has had no angina or cardiac complaints. He specifically denies dyspnea, orthopnea, and PND.  Past Medical History  Diagnosis Date  . Toxic goiter   . Hypertension   . Hyperlipidemia   . Diabetes mellitus without complication ([place])   . Coronary artery disease     Past Surgical History  Procedure Laterality Date  . Abdominal aortic aneurysm repair    . Cardiac bypass    . Coronary artery bypass graft      Current Medications: Outpatient Prescriptions Prior to Visit  Medication Sig Dispense Refill  . aspirin 325 MG tablet Take 1 tablet (325 mg total) by mouth daily.    . calcitRIOL (ROCALTROL) 0.25 MCG capsule Take 0.5-0.75 mcg by mouth daily. Alternate take 2 capsules one day then 3 capsules the next day    . furosemide (LASIX) 80 MG tablet Take 80-160 mg by mouth See admin instructions. Take 2 tablets every morning and take 1 tablet at noon    . levothyroxine (SYNTHROID, LEVOTHROID) 75 MCG tablet Take 75 mcg by mouth daily before breakfast.    . metoprolol succinate (TOPROL-XL) 25 MG 24 hr tablet TAKE ONE TABLET BY MOUTH ONCE DAILY 90 tablet 0  . niacin (NIASPAN) 1000 MG CR tablet Take 1,000 mg  by mouth 2 (two) times daily.    . potassium chloride (K-DUR,KLOR-CON) 10 MEQ tablet Take 10 mEq by mouth 2 (two) times daily.    . sertraline (ZOLOFT) 50 MG tablet Take 50 mg by mouth daily.    . insulin aspart protamine-insulin aspart (NOVOLOG 70/30) (70-30) 100 UNIT/ML injection Inject 35-40 Units into the skin 2 (two) times daily with a meal. Reported on 05/10/2016     No facility-administered medications prior to visit.     Allergies:   Review of patient's allergies indicates no known allergies.   Social History   Social History  . Marital Status: Married    Spouse Name: N/A  . Number of Children: N/A  . Years of Education: N/A   Social History Main Topics  . Smoking status: Former Smoker -- 1.00 packs/day    Quit date: 03/31/1996  . Smokeless tobacco: Never Used  . Alcohol Use: No  . Drug Use: No  . Sexual Activity: Yes    Birth Control/ Protection: None   Other Topics Concern  . None   Social History Narrative     Family History:  [name] includes Diabetes in his brother, brother, brother, and sister; Heart attack in his brother, brother, father, and sister; Stroke in his father and mother.   ROS:   Please see the history of present illness.    Blood in stool. Not recurred since  hospital stay All other systems reviewed and are negative.   PHYSICAL EXAM:   VS:  BP 112/54 mmHg  Pulse 76  Ht 5\' 10"  (1.778 m)  Wt 233 lb (105.688 kg)  BMI 33.43 kg/m2   GEN: Well nourished, well developed, in no acute distress. Morbidly obese. HEENT: normal Neck: no JVD, carotid bruits, or masses Cardiac: RRR; no murmurs, rubs, or gallops,no edema  Respiratory:  clear to auscultation bilaterally, normal work of breathing GI: soft, nontender, nondistended, + BS MS: no deformity or atrophy Skin: warm and dry, no rash Neuro:  Alert and Oriented x 3, Strength and sensation are intact Psych: euthymic mood, full affect  Wt Readings from Last 3 Encounters:    05/10/16 233 lb (105.688 kg)  04/01/16 231 lb 4.8 oz (104.917 kg)  05/24/15 260 lb 3.2 oz (118.026 kg)      Studies/Labs Reviewed:   EKG:  EKG  Performed while hospitalized 04/02/16 reveals normal sinus rhythm with poor R-wave progression. No acute ST or T-wave changes.  Current Labs: 03/30/2016: ALT 19 03/31/2016: Magnesium 1.5*; TSH 1.149 04/01/2016: BUN 29*; Creatinine, Ser 1.91*; Potassium 3.7; Sodium 143 04/02/2016: Hemoglobin 9.0*; Platelets 98*   Lipid Panel    Component Value Date/Time   CHOL 93 03/31/2016 0306   TRIG 102 03/31/2016 0306   HDL 35* 03/31/2016 0306   CHOLHDL 2.7 03/31/2016 0306   VLDL 20 03/31/2016 0306   LDLCALC 38 03/31/2016 0306    Additional studies/ records that were reviewed today include:  Reviewed discharge summary from 04/02/16. Transfusion was not required during the GI bleed. He has had no recurrence since discharge. He underwent upper GI endoscopy subsequently without any findings. Contemplation concerning colonoscopy is now occurring with Dr. Kathi Der guide his primary GI specialist.    ASSESSMENT:    1. Coronary artery disease involving coronary bypass graft of native heart without angina pectoris   2. Essential hypertension   3. Hyperlipidemia   4. Abdominal aortic aneurysm (AAA) without rupture ([place])   5. Gastrointestinal hemorrhage with melena      PLAN:  In order of problems listed above:  1. He denies angina. I encouraged aerobic activity to help decrease cardiovascular risk. Someone will nitroglycerin if needed for angina. Call if nitroglycerin is needed. Aspirin should be reduced to 81 mg daily.  2.  Blood pressures well controlled. 2 g sodium diet as advocated area  3.  Hyyperlipidemia followed by the patient's primary care physician. He is currently on statin therapy .   Medication Adjustments/Labs and Tests Ordered: Current medicines are reviewed at length with the patient today.  Concerns regarding medicines are outlined above.   Medication changes, Labs and Tests ordered today are listed in the Patient Instructions below. There are no Patient Instructions on file for this visit.   Signed, Roy Grooms, MD  05/10/2016 4:38 PM    [facility] [address], [place], [place]  [address] Phone: [phone]; Fax: [phone]

## 2016-05-10 NOTE — Patient Instructions (Signed)
Medication Instructions:  Your physician recommends that you continue on your current medications as directed. Please refer to the Current Medication list given to you today.   Labwork: none  Testing/Procedures: non  Follow-Up: Your physician wants you to follow-up in: 12 months.   You will receive a reminder letter in the mail two months in advance. If you don't receive a letter, please call our office to schedule the follow-up appointment.   Your physician discussed the importance of regular exercise and recommended that you start or continue a regular exercise program for good health.  Any Other Special Instructions Will Be Listed Below (If Applicable).     If you need a refill on your cardiac medications before your next appointment, please call your pharmacy.

## 2016-05-14 DIAGNOSIS — E890 Postprocedural hypothyroidism: Secondary | ICD-10-CM | POA: Diagnosis not present

## 2016-05-14 DIAGNOSIS — E1165 Type 2 diabetes mellitus with hyperglycemia: Secondary | ICD-10-CM | POA: Diagnosis not present

## 2016-05-14 DIAGNOSIS — I10 Essential (primary) hypertension: Secondary | ICD-10-CM | POA: Diagnosis not present

## 2016-05-14 DIAGNOSIS — E7800 Pure hypercholesterolemia, unspecified: Secondary | ICD-10-CM | POA: Diagnosis not present

## 2016-05-16 DIAGNOSIS — E1122 Type 2 diabetes mellitus with diabetic chronic kidney disease: Secondary | ICD-10-CM | POA: Diagnosis not present

## 2016-05-16 DIAGNOSIS — Z0000 Encounter for general adult medical examination without abnormal findings: Secondary | ICD-10-CM | POA: Diagnosis not present

## 2016-05-16 DIAGNOSIS — Z86010 Personal history of colonic polyps: Secondary | ICD-10-CM | POA: Diagnosis not present

## 2016-05-16 DIAGNOSIS — I25119 Atherosclerotic heart disease of native coronary artery with unspecified angina pectoris: Secondary | ICD-10-CM | POA: Diagnosis not present

## 2016-05-16 DIAGNOSIS — D500 Iron deficiency anemia secondary to blood loss (chronic): Secondary | ICD-10-CM | POA: Diagnosis not present

## 2016-05-16 DIAGNOSIS — D696 Thrombocytopenia, unspecified: Secondary | ICD-10-CM | POA: Diagnosis not present

## 2016-05-16 DIAGNOSIS — I10 Essential (primary) hypertension: Secondary | ICD-10-CM | POA: Diagnosis not present

## 2016-05-16 DIAGNOSIS — E782 Mixed hyperlipidemia: Secondary | ICD-10-CM | POA: Diagnosis not present

## 2016-05-16 DIAGNOSIS — Z794 Long term (current) use of insulin: Secondary | ICD-10-CM | POA: Diagnosis not present

## 2016-05-16 DIAGNOSIS — I4891 Unspecified atrial fibrillation: Secondary | ICD-10-CM | POA: Diagnosis not present

## 2016-05-16 DIAGNOSIS — E042 Nontoxic multinodular goiter: Secondary | ICD-10-CM | POA: Diagnosis not present

## 2016-05-16 DIAGNOSIS — I714 Abdominal aortic aneurysm, without rupture: Secondary | ICD-10-CM | POA: Diagnosis not present

## 2016-05-24 ENCOUNTER — Other Ambulatory Visit: Payer: Self-pay | Admitting: Gastroenterology

## 2016-05-24 DIAGNOSIS — K5731 Diverticulosis of large intestine without perforation or abscess with bleeding: Secondary | ICD-10-CM

## 2016-05-24 DIAGNOSIS — K921 Melena: Secondary | ICD-10-CM | POA: Diagnosis not present

## 2016-05-24 DIAGNOSIS — D500 Iron deficiency anemia secondary to blood loss (chronic): Secondary | ICD-10-CM | POA: Diagnosis not present

## 2016-05-28 DIAGNOSIS — D225 Melanocytic nevi of trunk: Secondary | ICD-10-CM | POA: Diagnosis not present

## 2016-05-28 DIAGNOSIS — X32XXXD Exposure to sunlight, subsequent encounter: Secondary | ICD-10-CM | POA: Diagnosis not present

## 2016-05-28 DIAGNOSIS — D0359 Melanoma in situ of other part of trunk: Secondary | ICD-10-CM | POA: Diagnosis not present

## 2016-05-28 DIAGNOSIS — L821 Other seborrheic keratosis: Secondary | ICD-10-CM | POA: Diagnosis not present

## 2016-05-28 DIAGNOSIS — L570 Actinic keratosis: Secondary | ICD-10-CM | POA: Diagnosis not present

## 2016-05-29 ENCOUNTER — Encounter: Payer: Self-pay | Admitting: Podiatry

## 2016-05-29 ENCOUNTER — Ambulatory Visit (INDEPENDENT_AMBULATORY_CARE_PROVIDER_SITE_OTHER): Payer: PPO | Admitting: Podiatry

## 2016-05-29 DIAGNOSIS — M79674 Pain in right toe(s): Secondary | ICD-10-CM

## 2016-05-29 DIAGNOSIS — B351 Tinea unguium: Secondary | ICD-10-CM

## 2016-05-29 DIAGNOSIS — M79675 Pain in left toe(s): Secondary | ICD-10-CM | POA: Diagnosis not present

## 2016-05-29 DIAGNOSIS — E1142 Type 2 diabetes mellitus with diabetic polyneuropathy: Secondary | ICD-10-CM

## 2016-05-29 NOTE — Patient Instructions (Signed)
Diabetes and Foot Care Diabetes may cause you to have problems because of poor blood supply (circulation) to your feet and legs. This may cause the skin on your feet to become thinner, break easier, and heal more slowly. Your skin may become dry, and the skin may peel and crack. You may also have nerve damage in your legs and feet causing decreased feeling in them. You may not notice minor injuries to your feet that could lead to infections or more serious problems. Taking care of your feet is one of the most important things you can do for yourself.  HOME CARE INSTRUCTIONS  Wear shoes at all times, even in the house. Do not go barefoot. Bare feet are easily injured.  Check your feet daily for blisters, cuts, and redness. If you cannot see the bottom of your feet, use a mirror or ask someone for help.  Wash your feet with warm water (do not use hot water) and mild soap. Then pat your feet and the areas between your toes until they are completely dry. Do not soak your feet as this can dry your skin.  Apply a moisturizing lotion or petroleum jelly (that does not contain alcohol and is unscented) to the skin on your feet and to dry, brittle toenails. Do not apply lotion between your toes.  Trim your toenails straight across. Do not dig under them or around the cuticle. File the edges of your nails with an emery board or nail file.  Do not cut corns or calluses or try to remove them with medicine.  Wear clean socks or stockings every day. Make sure they are not too tight. Do not wear knee-high stockings since they may decrease blood flow to your legs.  Wear shoes that fit properly and have enough cushioning. To break in new shoes, wear them for just a few hours a day. This prevents you from injuring your feet. Always look in your shoes before you put them on to be sure there are no objects inside.  Do not cross your legs. This may decrease the blood flow to your feet.  If you find a minor scrape,  cut, or break in the skin on your feet, keep it and the skin around it clean and dry. These areas may be cleansed with mild soap and water. Do not cleanse the area with peroxide, alcohol, or iodine.  When you remove an adhesive bandage, be sure not to damage the skin around it.  If you have a wound, look at it several times a day to make sure it is healing.  Do not use heating pads or hot water bottles. They may burn your skin. If you have lost feeling in your feet or legs, you may not know it is happening until it is too late.  Make sure your health care provider performs a complete foot exam at least annually or more often if you have foot problems. Report any cuts, sores, or bruises to your health care provider immediately. SEEK MEDICAL CARE IF:   You have an injury that is not healing.  You have cuts or breaks in the skin.  You have an ingrown nail.  You notice redness on your legs or feet.  You feel burning or tingling in your legs or feet.  You have pain or cramps in your legs and feet.  Your legs or feet are numb.  Your feet always feel cold. SEEK IMMEDIATE MEDICAL CARE IF:   There is increasing redness,   swelling, or pain in or around a wound.  There is a red line that goes up your leg.  Pus is coming from a wound.  You develop a fever or as directed by your health care provider.  You notice a bad smell coming from an ulcer or wound.   This information is not intended to replace advice given to you by your health care provider. Make sure you discuss any questions you have with your health care provider.   Document Released: 11/22/2000 Document Revised: 07/28/2013 Document Reviewed: 05/04/2013 Elsevier Interactive Patient Education 2016 [employer] Inc[employer]  

## 2016-05-29 NOTE — Progress Notes (Signed)
Patient ID: [name], male   DOB: Jun 10, 1934, 82 y.o.   MRN: [id number]  Subjective: This patient presents for scheduled visit complaining of uncomfortable toenails when walking wearing shoes and  requests toenail debridement.  Objective: Orientated 3  Vascular: Peripheral pitting edema bilaterally DP 0/4 bilaterally PT pulses 2/4 bilaterally Capillary reflex immediate bilaterally  Neurological: Sensation to 10 g monofilament wire intact 2/5 right 1/5 left Vibratory sensation nonreactive bilaterally Ankle reflexes reactive bilaterally  Dermatological: No open skin lesions bilaterally Dry skin bilaterally The toenails are extremely elongated, hypertrophic, discolored, deformed and tender direct palpation 6-10  Musculoskeletal: No deformities noted bilaterally There is no crepitus or restriction range of motion ankle, subtalar, or midtarsal joints bilaterally  Assessment: Decreased DP pulses bilaterally without history of claudication or open wounds Diabetic peripheral neuropathy Neglected symptomatic onychomycoses 6-10  Plan: The toenails 6-10 were debrided mechanically and electrically without any bleeding   Reappoint 3 months

## 2016-06-04 ENCOUNTER — Ambulatory Visit
Admission: RE | Admit: 2016-06-04 | Discharge: 2016-06-04 | Disposition: A | Discharge status: Home / Self Care | Payer: PPO | Source: Ambulatory Visit | Attending: Gastroenterology | Admitting: Gastroenterology

## 2016-06-04 DIAGNOSIS — D500 Iron deficiency anemia secondary to blood loss (chronic): Secondary | ICD-10-CM

## 2016-06-04 DIAGNOSIS — K5730 Diverticulosis of large intestine without perforation or abscess without bleeding: Secondary | ICD-10-CM | POA: Diagnosis not present

## 2016-06-04 DIAGNOSIS — K5731 Diverticulosis of large intestine without perforation or abscess with bleeding: Secondary | ICD-10-CM

## 2016-06-04 DIAGNOSIS — K921 Melena: Secondary | ICD-10-CM

## 2016-06-05 DIAGNOSIS — E119 Type 2 diabetes mellitus without complications: Secondary | ICD-10-CM | POA: Diagnosis not present

## 2016-06-05 DIAGNOSIS — H2513 Age-related nuclear cataract, bilateral: Secondary | ICD-10-CM | POA: Diagnosis not present

## 2016-06-14 DIAGNOSIS — L988 Other specified disorders of the skin and subcutaneous tissue: Secondary | ICD-10-CM | POA: Diagnosis not present

## 2016-06-14 DIAGNOSIS — D0359 Melanoma in situ of other part of trunk: Secondary | ICD-10-CM | POA: Diagnosis not present

## 2016-06-14 DIAGNOSIS — D485 Neoplasm of uncertain behavior of skin: Secondary | ICD-10-CM | POA: Diagnosis not present

## 2016-07-05 DIAGNOSIS — D500 Iron deficiency anemia secondary to blood loss (chronic): Secondary | ICD-10-CM | POA: Diagnosis not present

## 2016-07-05 DIAGNOSIS — K921 Melena: Secondary | ICD-10-CM | POA: Diagnosis not present

## 2016-07-10 DIAGNOSIS — R234 Changes in skin texture: Secondary | ICD-10-CM | POA: Diagnosis not present

## 2016-07-12 DIAGNOSIS — L570 Actinic keratosis: Secondary | ICD-10-CM | POA: Diagnosis not present

## 2016-07-12 DIAGNOSIS — C44619 Basal cell carcinoma of skin of left upper limb, including shoulder: Secondary | ICD-10-CM | POA: Diagnosis not present

## 2016-07-12 DIAGNOSIS — X32XXXD Exposure to sunlight, subsequent encounter: Secondary | ICD-10-CM | POA: Diagnosis not present

## 2016-07-12 DIAGNOSIS — D485 Neoplasm of uncertain behavior of skin: Secondary | ICD-10-CM | POA: Diagnosis not present

## 2016-07-12 DIAGNOSIS — K921 Melena: Secondary | ICD-10-CM | POA: Diagnosis not present

## 2016-08-13 DIAGNOSIS — I714 Abdominal aortic aneurysm, without rupture: Secondary | ICD-10-CM | POA: Diagnosis not present

## 2016-08-13 DIAGNOSIS — D631 Anemia in chronic kidney disease: Secondary | ICD-10-CM | POA: Diagnosis not present

## 2016-08-13 DIAGNOSIS — I129 Hypertensive chronic kidney disease with stage 1 through stage 4 chronic kidney disease, or unspecified chronic kidney disease: Secondary | ICD-10-CM | POA: Diagnosis not present

## 2016-08-13 DIAGNOSIS — N184 Chronic kidney disease, stage 4 (severe): Secondary | ICD-10-CM | POA: Diagnosis not present

## 2016-08-13 DIAGNOSIS — E118 Type 2 diabetes mellitus with unspecified complications: Secondary | ICD-10-CM | POA: Diagnosis not present

## 2016-08-13 DIAGNOSIS — I2510 Atherosclerotic heart disease of native coronary artery without angina pectoris: Secondary | ICD-10-CM | POA: Diagnosis not present

## 2016-08-13 DIAGNOSIS — Z8679 Personal history of other diseases of the circulatory system: Secondary | ICD-10-CM | POA: Diagnosis not present

## 2016-08-13 DIAGNOSIS — E1129 Type 2 diabetes mellitus with other diabetic kidney complication: Secondary | ICD-10-CM | POA: Diagnosis not present

## 2016-08-13 DIAGNOSIS — E782 Mixed hyperlipidemia: Secondary | ICD-10-CM | POA: Diagnosis not present

## 2016-08-13 DIAGNOSIS — E8770 Fluid overload, unspecified: Secondary | ICD-10-CM | POA: Diagnosis not present

## 2016-08-13 DIAGNOSIS — N2581 Secondary hyperparathyroidism of renal origin: Secondary | ICD-10-CM | POA: Diagnosis not present

## 2016-09-04 ENCOUNTER — Ambulatory Visit (INDEPENDENT_AMBULATORY_CARE_PROVIDER_SITE_OTHER): Payer: PPO | Admitting: Podiatry

## 2016-09-04 ENCOUNTER — Encounter: Payer: Self-pay | Admitting: Podiatry

## 2016-09-04 VITALS — BP 152/73 | HR 56 | Resp 18

## 2016-09-04 DIAGNOSIS — M79674 Pain in right toe(s): Secondary | ICD-10-CM

## 2016-09-04 DIAGNOSIS — E1142 Type 2 diabetes mellitus with diabetic polyneuropathy: Secondary | ICD-10-CM

## 2016-09-04 DIAGNOSIS — M79675 Pain in left toe(s): Secondary | ICD-10-CM | POA: Diagnosis not present

## 2016-09-04 DIAGNOSIS — B351 Tinea unguium: Secondary | ICD-10-CM

## 2016-09-04 NOTE — Progress Notes (Signed)
Patient ID: [name], male   DOB: 10-Aug-1934, 82 y.o.   MRN: [id number]     Subjective: This patient presents for scheduled visit complaining of uncomfortable toenails when walking wearing shoes and  requests toenail debridement.  Objective: Orientated 3  Vascular: Peripheral pitting edema bilaterally DP 0/4 bilaterally PT pulses 2/4 bilaterally Capillary reflex immediate bilaterally  Neurological: Sensation to 10 g monofilament wire intact 2/5 right 1/5 left Vibratory sensation nonreactive bilaterally Ankle reflexes reactive bilaterally  Dermatological: No open skin lesions bilaterally Dry skin bilaterally The toenails are extremely elongated, hypertrophic, discolored, deformed and tender direct palpation 6-10  Musculoskeletal: No deformities noted bilaterally There is no crepitus or restriction range of motion ankle, subtalar, or midtarsal joints bilaterally  Assessment: Decreased DP pulses bilaterally without history of claudication or open wounds Diabetic peripheral neuropathy Neglected symptomatic onychomycoses 6-10  Plan: The toenails 6-10 were debrided mechanically and electrically without any bleeding   Reappoint 3 months

## 2016-09-04 NOTE — Patient Instructions (Signed)
Diabetes and Foot Care Diabetes may cause you to have problems because of poor blood supply (circulation) to your feet and legs. This may cause the skin on your feet to become thinner, break easier, and heal more slowly. Your skin may become dry, and the skin may peel and crack. You may also have nerve damage in your legs and feet causing decreased feeling in them. You may not notice minor injuries to your feet that could lead to infections or more serious problems. Taking care of your feet is one of the most important things you can do for yourself.  HOME CARE INSTRUCTIONS  Wear shoes at all times, even in the house. Do not go barefoot. Bare feet are easily injured.  Check your feet daily for blisters, cuts, and redness. If you cannot see the bottom of your feet, use a mirror or ask someone for help.  Wash your feet with warm water (do not use hot water) and mild soap. Then pat your feet and the areas between your toes until they are completely dry. Do not soak your feet as this can dry your skin.  Apply a moisturizing lotion or petroleum jelly (that does not contain alcohol and is unscented) to the skin on your feet and to dry, brittle toenails. Do not apply lotion between your toes.  Trim your toenails straight across. Do not dig under them or around the cuticle. File the edges of your nails with an emery board or nail file.  Do not cut corns or calluses or try to remove them with medicine.  Wear clean socks or stockings every day. Make sure they are not too tight. Do not wear knee-high stockings since they may decrease blood flow to your legs.  Wear shoes that fit properly and have enough cushioning. To break in new shoes, wear them for just a few hours a day. This prevents you from injuring your feet. Always look in your shoes before you put them on to be sure there are no objects inside.  Do not cross your legs. This may decrease the blood flow to your feet.  If you find a minor scrape,  cut, or break in the skin on your feet, keep it and the skin around it clean and dry. These areas may be cleansed with mild soap and water. Do not cleanse the area with peroxide, alcohol, or iodine.  When you remove an adhesive bandage, be sure not to damage the skin around it.  If you have a wound, look at it several times a day to make sure it is healing.  Do not use heating pads or hot water bottles. They may burn your skin. If you have lost feeling in your feet or legs, you may not know it is happening until it is too late.  Make sure your health care provider performs a complete foot exam at least annually or more often if you have foot problems. Report any cuts, sores, or bruises to your health care provider immediately. SEEK MEDICAL CARE IF:   You have an injury that is not healing.  You have cuts or breaks in the skin.  You have an ingrown nail.  You notice redness on your legs or feet.  You feel burning or tingling in your legs or feet.  You have pain or cramps in your legs and feet.  Your legs or feet are numb.  Your feet always feel cold. SEEK IMMEDIATE MEDICAL CARE IF:   There is increasing redness,   swelling, or pain in or around a wound.  There is a red line that goes up your leg.  Pus is coming from a wound.  You develop a fever or as directed by your health care provider.  You notice a bad smell coming from an ulcer or wound.   This information is not intended to replace advice given to you by your health care provider. Make sure you discuss any questions you have with your health care provider.   Document Released: 11/22/2000 Document Revised: 07/28/2013 Document Reviewed: 05/04/2013 Elsevier Interactive Patient Education 2016 [employer] Inc[employer]  

## 2016-09-12 DIAGNOSIS — Z23 Encounter for immunization: Secondary | ICD-10-CM | POA: Diagnosis not present

## 2016-09-12 DIAGNOSIS — E1165 Type 2 diabetes mellitus with hyperglycemia: Secondary | ICD-10-CM | POA: Diagnosis not present

## 2016-10-01 DIAGNOSIS — Z08 Encounter for follow-up examination after completed treatment for malignant neoplasm: Secondary | ICD-10-CM | POA: Diagnosis not present

## 2016-10-01 DIAGNOSIS — I872 Venous insufficiency (chronic) (peripheral): Secondary | ICD-10-CM | POA: Diagnosis not present

## 2016-10-01 DIAGNOSIS — Z85820 Personal history of malignant melanoma of skin: Secondary | ICD-10-CM | POA: Diagnosis not present

## 2016-10-01 DIAGNOSIS — L821 Other seborrheic keratosis: Secondary | ICD-10-CM | POA: Diagnosis not present

## 2016-10-01 DIAGNOSIS — Z1283 Encounter for screening for malignant neoplasm of skin: Secondary | ICD-10-CM | POA: Diagnosis not present

## 2016-10-03 DIAGNOSIS — D500 Iron deficiency anemia secondary to blood loss (chronic): Secondary | ICD-10-CM | POA: Diagnosis not present

## 2016-10-03 DIAGNOSIS — K921 Melena: Secondary | ICD-10-CM | POA: Diagnosis not present

## 2016-10-23 ENCOUNTER — Other Ambulatory Visit: Payer: Self-pay | Admitting: Interventional Cardiology

## 2016-11-07 DIAGNOSIS — Z86010 Personal history of colonic polyps: Secondary | ICD-10-CM | POA: Diagnosis not present

## 2016-11-12 DIAGNOSIS — E039 Hypothyroidism, unspecified: Secondary | ICD-10-CM | POA: Diagnosis not present

## 2016-11-12 DIAGNOSIS — E1129 Type 2 diabetes mellitus with other diabetic kidney complication: Secondary | ICD-10-CM | POA: Diagnosis not present

## 2016-11-12 DIAGNOSIS — N2581 Secondary hyperparathyroidism of renal origin: Secondary | ICD-10-CM | POA: Diagnosis not present

## 2016-11-12 DIAGNOSIS — I4891 Unspecified atrial fibrillation: Secondary | ICD-10-CM | POA: Diagnosis not present

## 2016-11-12 DIAGNOSIS — N390 Urinary tract infection, site not specified: Secondary | ICD-10-CM | POA: Diagnosis not present

## 2016-11-12 DIAGNOSIS — I129 Hypertensive chronic kidney disease with stage 1 through stage 4 chronic kidney disease, or unspecified chronic kidney disease: Secondary | ICD-10-CM | POA: Diagnosis not present

## 2016-11-12 DIAGNOSIS — I714 Abdominal aortic aneurysm, without rupture: Secondary | ICD-10-CM | POA: Diagnosis not present

## 2016-11-12 DIAGNOSIS — E782 Mixed hyperlipidemia: Secondary | ICD-10-CM | POA: Diagnosis not present

## 2016-11-12 DIAGNOSIS — D631 Anemia in chronic kidney disease: Secondary | ICD-10-CM | POA: Diagnosis not present

## 2016-11-12 DIAGNOSIS — J439 Emphysema, unspecified: Secondary | ICD-10-CM | POA: Diagnosis not present

## 2016-11-12 DIAGNOSIS — N184 Chronic kidney disease, stage 4 (severe): Secondary | ICD-10-CM | POA: Diagnosis not present

## 2016-11-12 DIAGNOSIS — E118 Type 2 diabetes mellitus with unspecified complications: Secondary | ICD-10-CM | POA: Diagnosis not present

## 2016-11-12 DIAGNOSIS — E8770 Fluid overload, unspecified: Secondary | ICD-10-CM | POA: Diagnosis not present

## 2016-12-04 ENCOUNTER — Ambulatory Visit (INDEPENDENT_AMBULATORY_CARE_PROVIDER_SITE_OTHER): Payer: PPO | Admitting: Podiatry

## 2016-12-04 ENCOUNTER — Encounter: Payer: Self-pay | Admitting: Podiatry

## 2016-12-04 VITALS — BP 157/81 | HR 54 | Resp 18

## 2016-12-04 DIAGNOSIS — B351 Tinea unguium: Secondary | ICD-10-CM

## 2016-12-04 DIAGNOSIS — E1142 Type 2 diabetes mellitus with diabetic polyneuropathy: Secondary | ICD-10-CM | POA: Diagnosis not present

## 2016-12-04 DIAGNOSIS — M79675 Pain in left toe(s): Secondary | ICD-10-CM

## 2016-12-04 DIAGNOSIS — M79674 Pain in right toe(s): Secondary | ICD-10-CM | POA: Diagnosis not present

## 2016-12-04 NOTE — Progress Notes (Signed)
Patient ID: [name], male   DOB: 1934/06/17, 82 y.o.   MRN: [id number]    Subjective: This patient presents for scheduled visit complaining of uncomfortable toenails when walking wearing shoes and requests toenail debridement.  Objective: Orientated 3  Vascular: Peripheral pitting edema bilaterally DP 0/4 bilaterally PT pulses 2/4 bilaterally Capillary reflex immediate bilaterally  Neurological: Sensation to 10 g monofilament wire intact 2/5 right 1/5 left Vibratory sensation nonreactive bilaterally Ankle reflexes reactive bilaterally  Dermatological: No open skin lesions bilaterally Dry skin bilaterally The toenails are extremely elongated, hypertrophic, discolored, deformed and tender direct palpation 6-10  Musculoskeletal: No deformities noted bilaterally There is no crepitus or restriction range of motion ankle, subtalar, or midtarsal joints bilaterally  Assessment: Decreased DP pulses bilaterally without history of claudication or open wounds Diabetic peripheral neuropathy Neglected symptomatic onychomycoses 6-10  Plan: The toenails 6-10 were debrided mechanically and electrically without any bleeding   Reappoint 3 months

## 2016-12-04 NOTE — Patient Instructions (Signed)

## 2016-12-31 DIAGNOSIS — D500 Iron deficiency anemia secondary to blood loss (chronic): Secondary | ICD-10-CM | POA: Diagnosis not present

## 2017-01-01 DIAGNOSIS — Z85820 Personal history of malignant melanoma of skin: Secondary | ICD-10-CM | POA: Diagnosis not present

## 2017-01-01 DIAGNOSIS — L820 Inflamed seborrheic keratosis: Secondary | ICD-10-CM | POA: Diagnosis not present

## 2017-01-01 DIAGNOSIS — Z08 Encounter for follow-up examination after completed treatment for malignant neoplasm: Secondary | ICD-10-CM | POA: Diagnosis not present

## 2017-01-01 DIAGNOSIS — Z1283 Encounter for screening for malignant neoplasm of skin: Secondary | ICD-10-CM | POA: Diagnosis not present

## 2017-01-01 DIAGNOSIS — L821 Other seborrheic keratosis: Secondary | ICD-10-CM | POA: Diagnosis not present

## 2017-01-14 DIAGNOSIS — I10 Essential (primary) hypertension: Secondary | ICD-10-CM | POA: Diagnosis not present

## 2017-01-14 DIAGNOSIS — E7800 Pure hypercholesterolemia, unspecified: Secondary | ICD-10-CM | POA: Diagnosis not present

## 2017-01-14 DIAGNOSIS — E1165 Type 2 diabetes mellitus with hyperglycemia: Secondary | ICD-10-CM | POA: Diagnosis not present

## 2017-01-14 DIAGNOSIS — E890 Postprocedural hypothyroidism: Secondary | ICD-10-CM | POA: Diagnosis not present

## 2017-02-05 DIAGNOSIS — Z794 Long term (current) use of insulin: Secondary | ICD-10-CM | POA: Diagnosis not present

## 2017-02-05 DIAGNOSIS — E782 Mixed hyperlipidemia: Secondary | ICD-10-CM | POA: Diagnosis not present

## 2017-02-05 DIAGNOSIS — E1122 Type 2 diabetes mellitus with diabetic chronic kidney disease: Secondary | ICD-10-CM | POA: Diagnosis not present

## 2017-02-05 DIAGNOSIS — E042 Nontoxic multinodular goiter: Secondary | ICD-10-CM | POA: Diagnosis not present

## 2017-02-07 DIAGNOSIS — N2581 Secondary hyperparathyroidism of renal origin: Secondary | ICD-10-CM | POA: Diagnosis not present

## 2017-02-07 DIAGNOSIS — E1129 Type 2 diabetes mellitus with other diabetic kidney complication: Secondary | ICD-10-CM | POA: Diagnosis not present

## 2017-02-07 DIAGNOSIS — D631 Anemia in chronic kidney disease: Secondary | ICD-10-CM | POA: Diagnosis not present

## 2017-02-07 DIAGNOSIS — N184 Chronic kidney disease, stage 4 (severe): Secondary | ICD-10-CM | POA: Diagnosis not present

## 2017-02-07 DIAGNOSIS — I129 Hypertensive chronic kidney disease with stage 1 through stage 4 chronic kidney disease, or unspecified chronic kidney disease: Secondary | ICD-10-CM | POA: Diagnosis not present

## 2017-02-07 DIAGNOSIS — E039 Hypothyroidism, unspecified: Secondary | ICD-10-CM | POA: Diagnosis not present

## 2017-02-07 DIAGNOSIS — E118 Type 2 diabetes mellitus with unspecified complications: Secondary | ICD-10-CM | POA: Diagnosis not present

## 2017-02-09 DIAGNOSIS — J069 Acute upper respiratory infection, unspecified: Secondary | ICD-10-CM | POA: Diagnosis not present

## 2017-03-05 ENCOUNTER — Ambulatory Visit: Payer: PPO | Admitting: Podiatry

## 2017-03-06 ENCOUNTER — Encounter: Payer: Self-pay | Admitting: Neurology

## 2017-03-06 ENCOUNTER — Ambulatory Visit (INDEPENDENT_AMBULATORY_CARE_PROVIDER_SITE_OTHER): Payer: PPO | Admitting: Neurology

## 2017-03-06 VITALS — BP 129/68 | HR 64 | Ht 70.0 in | Wt 220.5 lb

## 2017-03-06 DIAGNOSIS — E538 Deficiency of other specified B group vitamins: Secondary | ICD-10-CM | POA: Diagnosis not present

## 2017-03-06 DIAGNOSIS — R413 Other amnesia: Secondary | ICD-10-CM | POA: Diagnosis not present

## 2017-03-06 HISTORY — DX: Other amnesia: R41.3

## 2017-03-06 NOTE — Patient Instructions (Signed)
   We will check blood work and get MRI of the brain.

## 2017-03-06 NOTE — Progress Notes (Signed)
Reason for visit: Memory disturbance  Referring physician: [name] is a 83 y.o. male  History of present illness:  [name] is an 83 year old right-handed white male with a history of a mild memory disturbance that has been present for about 2 years according to the family. In December 2017, the patient began forgetting to take some of his medication for diabetes. He began having blood sugars that were much higher than usual, ranging in the 300-500 levels. The patient began having an alteration in his mental status, he became more withdrawn, drowsy, and slightly confused. The patient was having difficulty operating a motor vehicle, he was getting lost with driving. He went several months with blood sugars out of control, but about a month ago it was discovered that he was not taking his medications properly, and adequate management of the medications has led to an improvement in blood sugars and an improvement in mental status. The patient is returning back to his usual baseline. He still has an underlying memory problem. He is no longer operating a motor vehicle. He requires some assistance with keeping up with medications, he handles his own appointments. He has not been doing the finances, his wife has always done this. He does have some short-term memory issues. He indicates that he sleeps well at night, he has good energy levels during the day. He denies any new numbness or weakness of the face, arms, or legs. He does have some numbness in the hands that is chronic in nature. He denies any significant balance issues, but he does stumble on occasion. The patient has had some difficulty with fecal and urinary incontinence. He has chronic renal insufficiency. He is sent to this office for an evaluation.  Past Medical History:  Diagnosis Date  . Coronary artery disease   . Diabetes mellitus without complication ([place])   . Hyperlipidemia   . Hypertension   . Memory difficulty  03/06/2017  . Toxic goiter     Past Surgical History:  Procedure Laterality Date  . ABDOMINAL AORTIC ANEURYSM REPAIR    . cardiac bypass    . CORONARY ARTERY BYPASS GRAFT      Family History  Problem Relation Age of Onset  . Stroke Mother   . Stroke Father   . Heart attack Father   . Diabetes Sister   . Heart attack Sister   . Diabetes Brother   . Heart attack Brother   . Diabetes Brother   . Diabetes Brother   . Heart attack Brother     Social history:  reports that he quit smoking about 20 years ago. He smoked 1.00 pack per day. He has never used smokeless tobacco. He reports that he does not drink alcohol or use drugs.  Medications:  Prior to Admission medications   Medication Sig Start Date End Date Taking? Authorizing Provider  acetaminophen (TYLENOL) 650 MG CR tablet Take 650 mg by mouth every 8 (eight) hours as needed for pain. 1/2 tablet as needed   Yes Historical Provider, MD  aspirin EC 81 MG tablet Take 81 mg by mouth daily.   Yes Historical Provider, MD  atorvastatin (LIPITOR) 40 MG tablet Take 40 mg by mouth daily.   Yes Historical Provider, MD  augmented betamethasone dipropionate (DIPROLENE-AF) 0.05 % ointment Apply 1 application topically as needed.   Yes Historical Provider, MD  calcitRIOL (ROCALTROL) 0.25 MCG capsule Take 0.5-0.75 mcg by mouth daily. Alternate take 2 capsules one day then  3 capsules the next day   Yes Historical Provider, MD  furosemide (LASIX) 80 MG tablet Take 80-160 mg by mouth See admin instructions. Take 2 tablets every morning and take 1 tablet at noon   Yes Historical Provider, MD  insulin aspart protamine- aspart (NOVOLOG MIX 70/30) (70-30) 100 UNIT/ML injection Inject 30 Units into the skin 3 (three) times daily. Inject 60 units into the skin every morning and inject 40 units into the skin every evening.    Yes Historical Provider, MD  ketoconazole (NIZORAL) 2 % cream Apply 1 application topically daily.   Yes Historical Provider, MD    LANTUS 100 UNIT/ML injection Inject 60 Units into the skin 2 (two) times daily. 01/03/17  Yes Historical Provider, MD  levothyroxine (SYNTHROID, LEVOTHROID) 75 MCG tablet Take 75 mcg by mouth daily before breakfast.   Yes Historical Provider, MD  loratadine (CLARITIN) 10 MG tablet Take 10 mg by mouth daily.   Yes Historical Provider, MD  metoprolol succinate (TOPROL-XL) 25 MG 24 hr tablet Take 1 tablet (25 mg total) by mouth daily. 10/23/16  Yes Belva Crome, MD  Multiple Vitamins-Minerals (MULTIVITAMIN ADULTS PO) Take 1 tablet by mouth daily.   Yes Historical Provider, MD  niacin (NIASPAN) 1000 MG CR tablet Take 1,000 mg by mouth 2 (two) times daily.   Yes Historical Provider, MD  omeprazole (PRILOSEC) 20 MG capsule Take 20 mg by mouth daily.   Yes Historical Provider, MD  potassium chloride (K-DUR,KLOR-CON) 10 MEQ tablet Take 10 mEq by mouth 2 (two) times daily.   Yes Historical Provider, MD  sertraline (ZOLOFT) 50 MG tablet Take 50 mg by mouth daily.   Yes Historical Provider, MD  valACYclovir (VALTREX) 500 MG tablet Take 500 mg by mouth as needed.   Yes Historical Provider, MD     No Known Allergies  ROS:  Out of a complete 14 system review of symptoms, the patient complains only of the following symptoms, and all other reviewed systems are negative.  Memory disturbance  Blood pressure 129/68, pulse 64, height 5\' 10"  (1.778 m), weight 220 lb 8 oz (100 kg).  Physical Exam  General: The patient is alert and cooperative at the time of the examination. The patient is moderately to markedly obese.  Eyes: Pupils are equal, round, and reactive to light. Discs are flat bilaterally.  Neck: The neck is supple, no carotid bruits are noted.  Respiratory: The respiratory examination is clear.  Cardiovascular: The cardiovascular examination reveals a regular rate and rhythm, no obvious murmurs or rubs are noted.  Skin: Extremities are with 1+ edema, there is a rubor of both  feet.  Neurologic Exam  Mental status: The patient is alert and oriented x 3 at the time of the examination. The patient has apparent normal recent and remote memory, with an apparently normal attention span and concentration ability. Mini-Mental Status Examination done today shows a total score of 30/30.  Cranial nerves: Facial symmetry is present. There is good sensation of the face to pinprick and soft touch bilaterally. The strength of the facial muscles and the muscles to head turning and shoulder shrug are normal bilaterally. Speech is well enunciated, no aphasia or dysarthria is noted. Extraocular movements are full. Visual fields are full. The tongue is midline, and the patient has symmetric elevation of the soft palate. No obvious hearing deficits are noted.  Motor: The motor testing reveals 5 over 5 strength of all 4 extremities. Good symmetric motor tone is noted throughout.  Sensory: Sensory testing is intact to pinprick, soft touch, vibration sensation, and position sense on all 4 extremities, with exception of some decrease in position sense in both feet. No evidence of extinction is noted.  Coordination: Cerebellar testing reveals good finger-nose-finger and heel-to-shin bilaterally.  Gait and station: Gait is normal. Tandem gait is unsteady. Romberg is negative. No drift is seen.  Reflexes: Deep tendon reflexes are symmetric, but are depressed bilaterally. Toes are downgoing bilaterally.   Assessment/Plan:  1. Mild memory disturbance  2. Transient mild metabolic encephalopathy  The patient has a mild memory disturbance that was exacerbated by poor blood sugar control. The patient has improved with better management of his diabetes. The patient will be set up for MRI of the brain, he does have risk factors for stroke including atrial fibrillation, diabetes, and hypertension. The patient will have blood work done today. We will consider medication for memory the future. He will  follow-up in 6 months.  Jill Alexanders MD 03/06/2017 4:10 PM  [facility] [address] [place], [place] [address]  Phone [phone] Fax [phone]

## 2017-03-07 LAB — RPR: RPR Ser Ql: NONREACTIVE

## 2017-03-07 LAB — SEDIMENTATION RATE: Sed Rate: 2 mm/hr (ref 0–30)

## 2017-03-07 LAB — VITAMIN B12: Vitamin B-12: 643 pg/mL (ref 232–1245)

## 2017-03-12 ENCOUNTER — Ambulatory Visit: Payer: PPO | Admitting: Neurology

## 2017-03-18 ENCOUNTER — Ambulatory Visit: Payer: PPO | Admitting: Neurology

## 2017-03-18 ENCOUNTER — Encounter: Payer: Self-pay | Admitting: Podiatry

## 2017-03-18 ENCOUNTER — Ambulatory Visit (INDEPENDENT_AMBULATORY_CARE_PROVIDER_SITE_OTHER): Payer: PPO | Admitting: Podiatry

## 2017-03-18 DIAGNOSIS — B351 Tinea unguium: Secondary | ICD-10-CM

## 2017-03-18 DIAGNOSIS — M79675 Pain in left toe(s): Secondary | ICD-10-CM

## 2017-03-18 DIAGNOSIS — M79674 Pain in right toe(s): Secondary | ICD-10-CM

## 2017-03-18 DIAGNOSIS — E1142 Type 2 diabetes mellitus with diabetic polyneuropathy: Secondary | ICD-10-CM

## 2017-03-18 NOTE — Progress Notes (Signed)
Complaint:  Visit Type: Patient returns to my office for continued preventative foot care services. Complaint: Patient states" my nails have grown long and thick and become painful to walk and wear shoes" Patient has been diagnosed with DM with no foot complications. The patient presents for preventative foot care services. No changes to ROS  Podiatric Exam: Vascular: dorsalis pedis and posterior tibial pulses are not  palpable bilateral. Capillary return is immediate. Temperature gradient is WNL. Skin turgor WNL  Sensorium: Diminished  Semmes Weinstein monofilament test. Normal tactile sensation bilaterally. Nail Exam: Pt has thick disfigured discolored nails with subungual debris noted bilateral entire nail hallux through fifth toenails Ulcer Exam: There is no evidence of ulcer or pre-ulcerative changes or infection. Orthopedic Exam: Muscle tone and strength are WNL. No limitations in general ROM. No crepitus or effusions noted. Foot type and digits show no abnormalities. Bony prominences are unremarkable. Skin: No Porokeratosis. No infection or ulcers.  Black skin lesion under ball of left foot.  Diagnosis:  Onychomycosis, , Pain in right toe, pain in left toes  Treatment & Plan Procedures and Treatment: Consent by patient was obtained for treatment procedures. The patient understood the discussion of treatment and procedures well. All questions were answered thoroughly reviewed. Debridement of mycotic and hypertrophic toenails, 1 through 5 bilateral and clearing of subungual debris. No ulceration, no infection noted. Evaluated black skin lesion. Return Visit-Office Procedure: Patient instructed to return to the office for a follow up visit 3 months for continued evaluation and treatment.    [name]

## 2017-03-25 ENCOUNTER — Ambulatory Visit
Admission: RE | Admit: 2017-03-25 | Discharge: 2017-03-25 | Disposition: A | Discharge status: Home / Self Care | Payer: PPO | Source: Ambulatory Visit | Attending: Neurology | Admitting: Neurology

## 2017-03-25 DIAGNOSIS — R413 Other amnesia: Secondary | ICD-10-CM | POA: Diagnosis not present

## 2017-03-27 ENCOUNTER — Telehealth: Payer: Self-pay | Admitting: Neurology

## 2017-03-27 NOTE — Telephone Encounter (Signed)
  I called the patient. MRI the brain does show some cortical atrophy, mild to moderate level of small vessel schema changes. If they desire to go on a medication for memory, they are to contact our office, we will start Aricept.  MRI Brain 03/26/17:  IMPRESSION:  This MRI of the brain without contrast shows the following: 1.   Moderate cortical atrophy that is a little more pronounced in the mesial temporal lobes. 2.   Chronic lacunar infarctions in the right basal ganglia and right internal capsule. Additionally, there are scattered T2/FLAIR hyperintense foci in both hemispheres and the right thalamus consistent with chronic microvascular ischemic changes.  3.   There are no acute findings.

## 2017-04-01 MED ORDER — DONEPEZIL HCL 5 MG PO TABS: 5.0000 mg | ORAL_TABLET | Freq: Every day | ORAL | 1 refills | Status: DC

## 2017-04-01 NOTE — Telephone Encounter (Signed)
I called the patient, talk with the wife. We will start Aricept, they will call us in one month if he is tolerating the 5 mg dose.

## 2017-04-01 NOTE — Addendum Note (Signed)
Addended by: Kathrynn Ducking on: 04/01/2017 05:16 PM   Modules accepted: Orders

## 2017-04-01 NOTE — Telephone Encounter (Signed)
Patients wife called office stating her husband is wanting to move forward and start Aricept.  [place].  Patients wife would like to know if patient is able to do the generic.  Please call

## 2017-04-02 DIAGNOSIS — Z08 Encounter for follow-up examination after completed treatment for malignant neoplasm: Secondary | ICD-10-CM | POA: Diagnosis not present

## 2017-04-02 DIAGNOSIS — Z1283 Encounter for screening for malignant neoplasm of skin: Secondary | ICD-10-CM | POA: Diagnosis not present

## 2017-04-02 DIAGNOSIS — Z85820 Personal history of malignant melanoma of skin: Secondary | ICD-10-CM | POA: Diagnosis not present

## 2017-04-02 DIAGNOSIS — L821 Other seborrheic keratosis: Secondary | ICD-10-CM | POA: Diagnosis not present

## 2017-04-08 DIAGNOSIS — E1165 Type 2 diabetes mellitus with hyperglycemia: Secondary | ICD-10-CM | POA: Diagnosis not present

## 2017-04-08 DIAGNOSIS — I10 Essential (primary) hypertension: Secondary | ICD-10-CM | POA: Diagnosis not present

## 2017-04-08 DIAGNOSIS — E890 Postprocedural hypothyroidism: Secondary | ICD-10-CM | POA: Diagnosis not present

## 2017-04-08 DIAGNOSIS — E7800 Pure hypercholesterolemia, unspecified: Secondary | ICD-10-CM | POA: Diagnosis not present

## 2017-04-27 IMAGING — CT CT ABD-PELV W/O CM
2 of 4 series · 16 of 46 positions shown, 18 images · non-contrast
Comparison: 05/17/2015

CLINICAL DATA: Diverticular hemorrhage and blood loss.

EXAM:
CT ABDOMEN AND PELVIS WITHOUT CONTRAST
TECHNIQUE: Multidetector CT imaging of the abdomen and pelvis was performed
following the standard protocol without IV contrast.

[Series 2: abd/pelvis w/(date) · axial · 0.96mm/px · z∈[+641,+1061]mm · 13 of 92 slices shown, 15 images]
[im 4/92  soft-tissue]
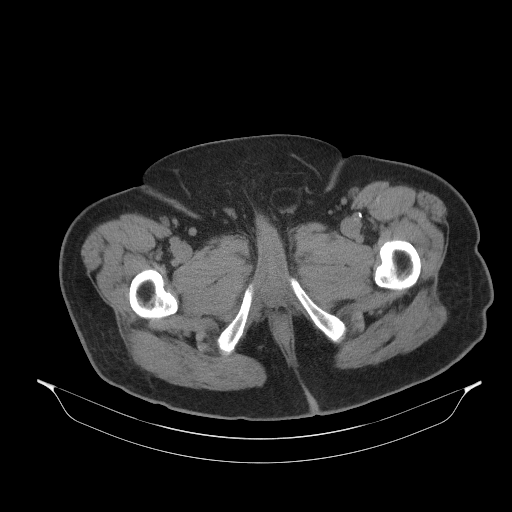
[im 4/92  bone]
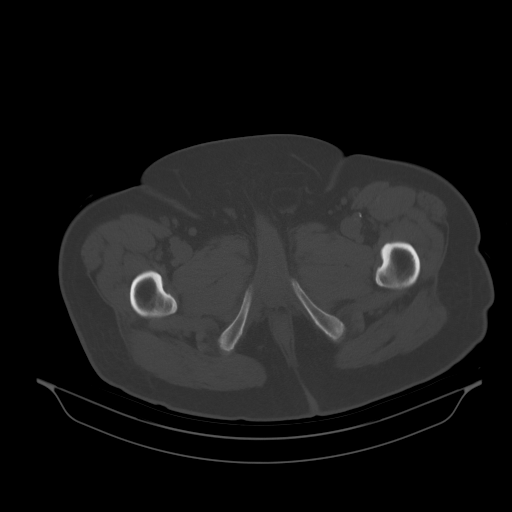
[im 11/92  soft-tissue]
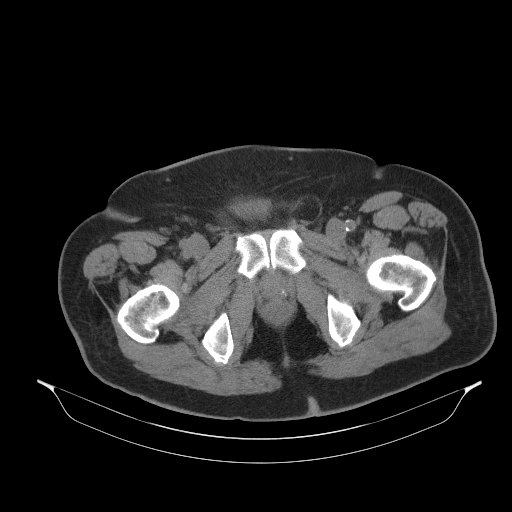
[im 18/92  soft-tissue]
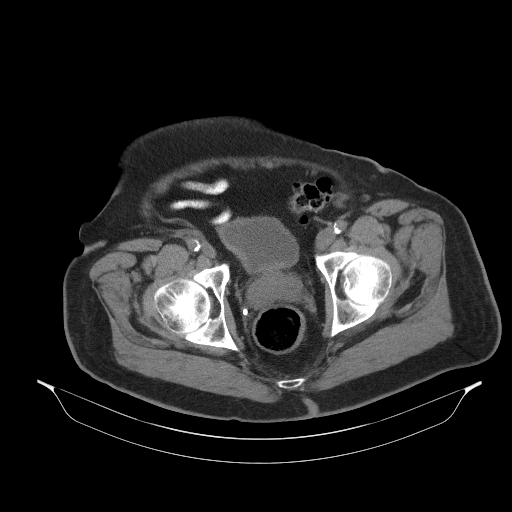
[im 25/92  soft-tissue]
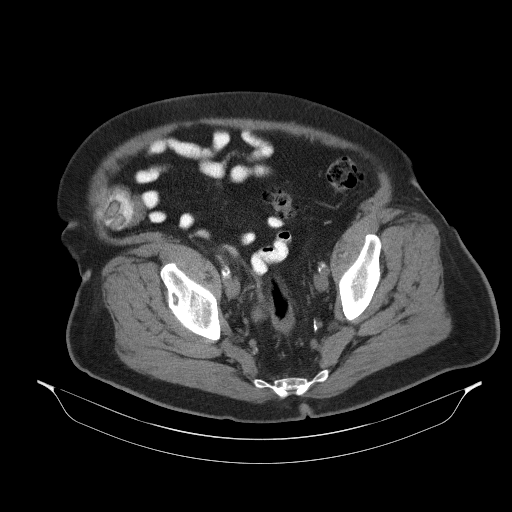
[im 32/92  soft-tissue]
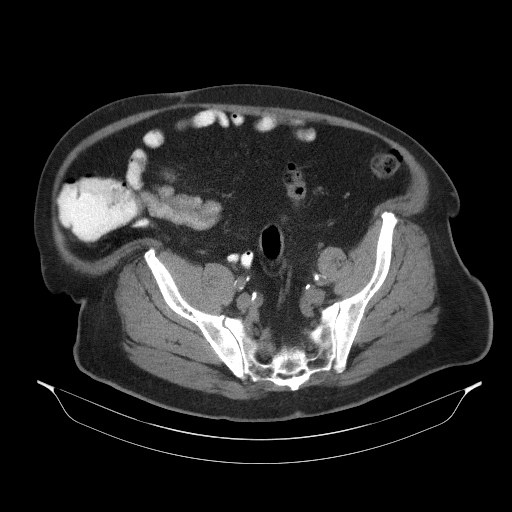
[im 39/92  soft-tissue]
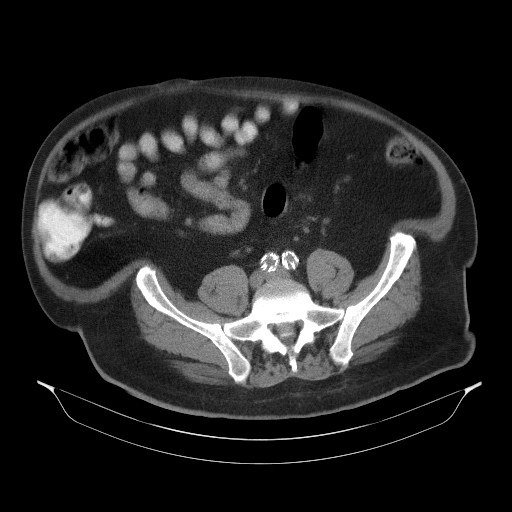
[im 46/92  soft-tissue]
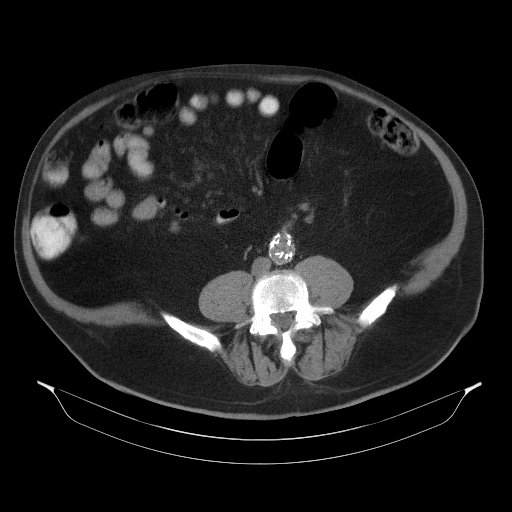
[im 53/92  soft-tissue]
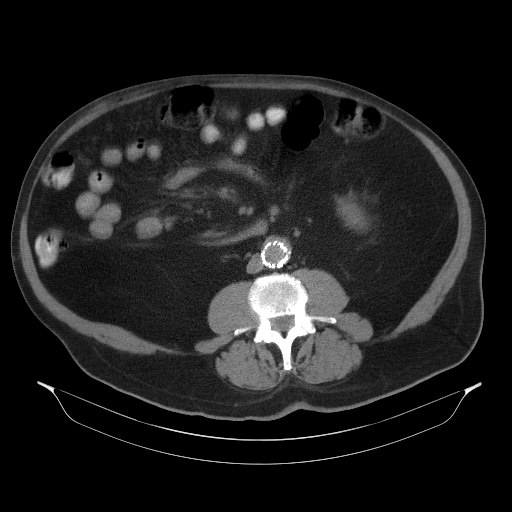
[im 60/92  soft-tissue]
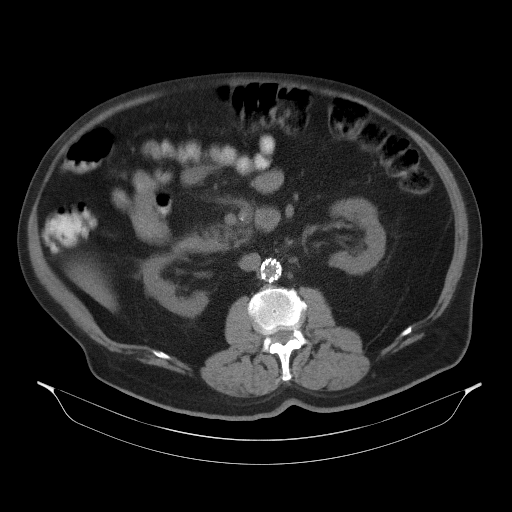
[im 60/92  bone]
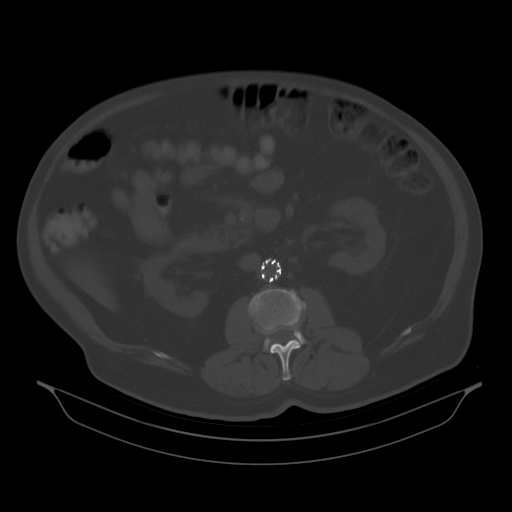
[im 67/92  soft-tissue]
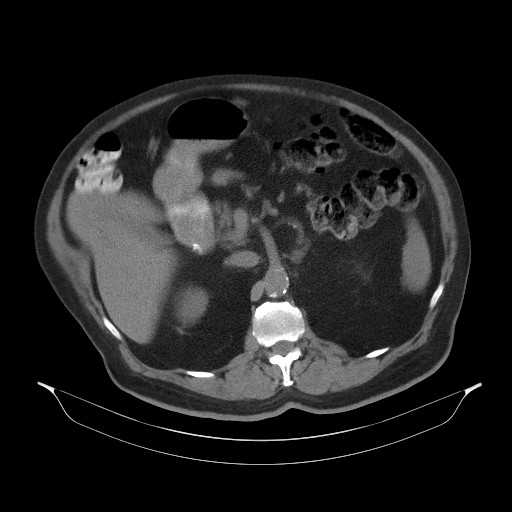
[im 74/92  soft-tissue]
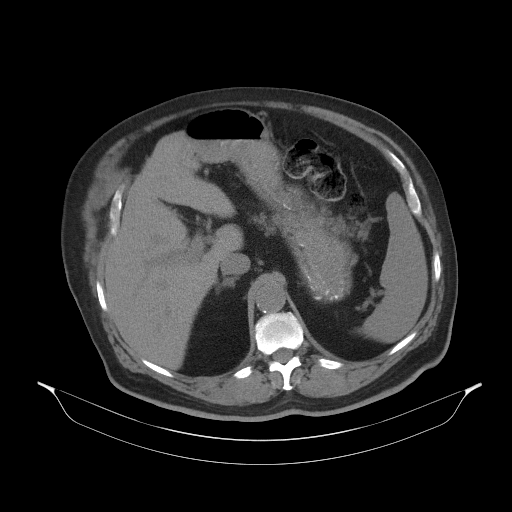
[im 81/92  soft-tissue]
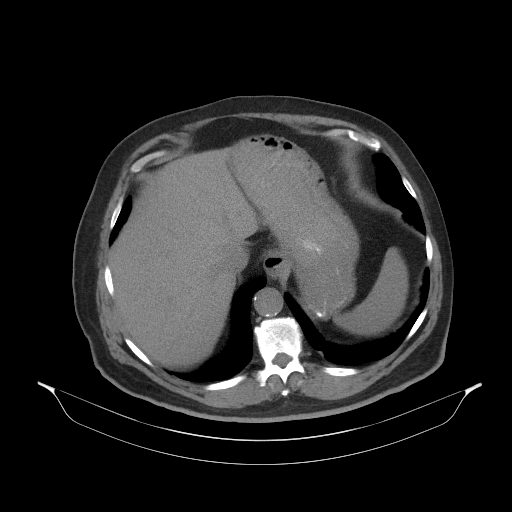
[im 88/92  soft-tissue]
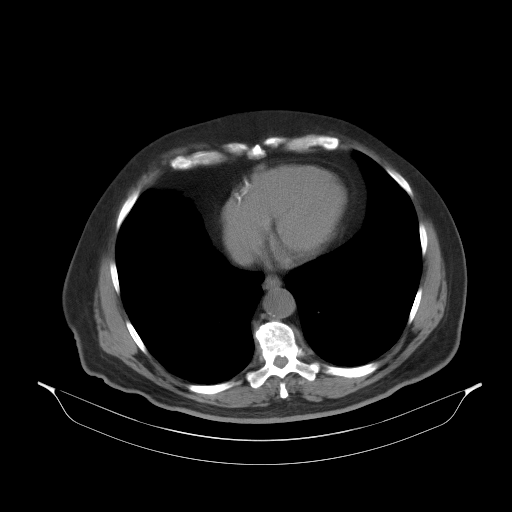

[Series 3: cor · coronal · 0.88mm/px · 3 of 110 slices shown]
[im 37/110  soft-tissue]
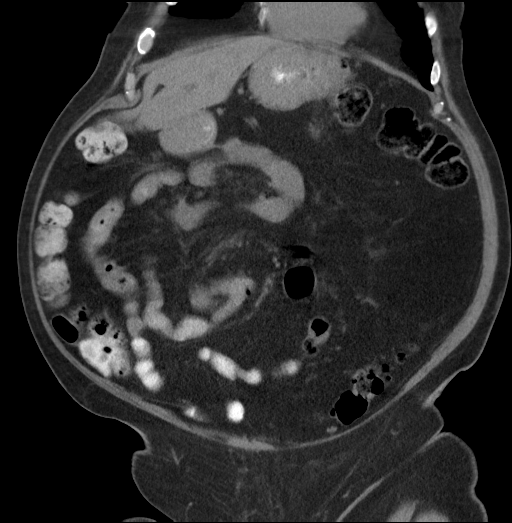
[im 49/110  soft-tissue]
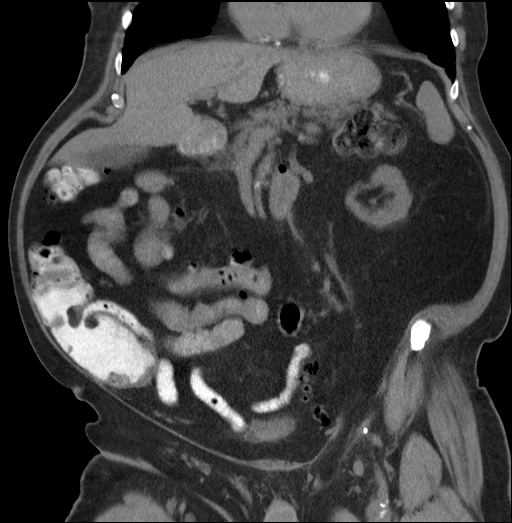
[im 61/110  soft-tissue]
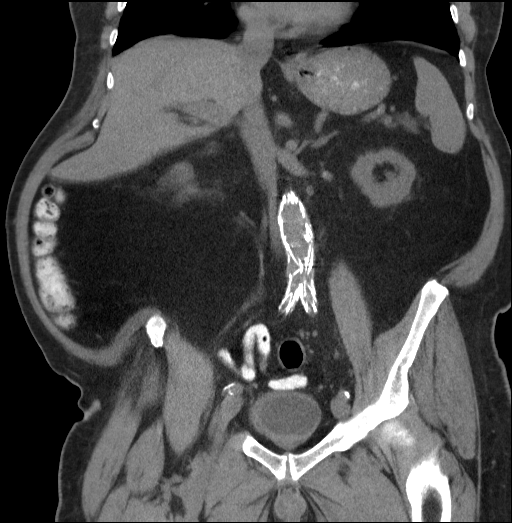

[16 of 46 positions shown; findings below may reference images not displayed]

FINDINGS: Lower chest: The lung bases are clear. No pleural or pericardial
effusion.

Hepatobiliary: No suspicious liver abnormalities identified. The
gallbladder appears normal. There is no biliary dilatation.

Pancreas: No mass or inflammatory process identified on this
un-enhanced exam.

Spleen: Within normal limits in size.

Adrenals/Urinary Tract: Normal adrenal glands. Mild bilateral renal
cortical volume loss identified. No nephrolithiasis or obstructive
uropathy. The urinary bladder is unremarkable.

Stomach/Bowel: The stomach appears normal. The small bowel loops
have a normal course and caliber. No bowel obstruction. Normal
appearance of the proximal colon. Numerous distal colonic
diverticula identified. No acute inflammation.

Vascular/Lymphatic: Aortic atherosclerosis noted. The patient is
status post stent graft repair of the infrarenal abdominal aorta.
Aneurysm sac measures 3.7 cm, unchanged from previous exam, image 43
of series 2. No enlarged retroperitoneal or mesenteric adenopathy.
No enlarged pelvic or inguinal lymph nodes.

Reproductive: The prostate gland appears enlarged. There is
asymmetric enlargement of the right seminal vesicle, image number 72
of series 2.

Other: There is no ascites or focal fluid collections within the
abdomen or pelvis. There is a fact containing left inguinal hernia.
Small periumbilical hernia is also noted which contains fat only.

Musculoskeletal:  No suspicious bone lesions identified.
IMPRESSION: 1. No acute findings identified within the abdomen or pelvis.
2. Aortic atherosclerosis. Status post repair of infrarenal
abdominal aortic aneurysm.
3. Asymmetric enlargement of the right seminal vesicle is of on
certain clinical significance. Suggest correlation with patient's
PSA.
4. Sigmoid diverticulosis without acute inflammation.

## 2017-04-29 DIAGNOSIS — E119 Type 2 diabetes mellitus without complications: Secondary | ICD-10-CM | POA: Diagnosis not present

## 2017-04-29 DIAGNOSIS — H2513 Age-related nuclear cataract, bilateral: Secondary | ICD-10-CM | POA: Diagnosis not present

## 2017-05-13 DIAGNOSIS — D631 Anemia in chronic kidney disease: Secondary | ICD-10-CM | POA: Diagnosis not present

## 2017-05-13 DIAGNOSIS — I4891 Unspecified atrial fibrillation: Secondary | ICD-10-CM | POA: Diagnosis not present

## 2017-05-13 DIAGNOSIS — E118 Type 2 diabetes mellitus with unspecified complications: Secondary | ICD-10-CM | POA: Diagnosis not present

## 2017-05-13 DIAGNOSIS — Z8679 Personal history of other diseases of the circulatory system: Secondary | ICD-10-CM | POA: Diagnosis not present

## 2017-05-13 DIAGNOSIS — E1129 Type 2 diabetes mellitus with other diabetic kidney complication: Secondary | ICD-10-CM | POA: Diagnosis not present

## 2017-05-13 DIAGNOSIS — N184 Chronic kidney disease, stage 4 (severe): Secondary | ICD-10-CM | POA: Diagnosis not present

## 2017-05-13 DIAGNOSIS — I714 Abdominal aortic aneurysm, without rupture: Secondary | ICD-10-CM | POA: Diagnosis not present

## 2017-05-13 DIAGNOSIS — I2510 Atherosclerotic heart disease of native coronary artery without angina pectoris: Secondary | ICD-10-CM | POA: Diagnosis not present

## 2017-05-13 DIAGNOSIS — N2581 Secondary hyperparathyroidism of renal origin: Secondary | ICD-10-CM | POA: Diagnosis not present

## 2017-05-13 DIAGNOSIS — I129 Hypertensive chronic kidney disease with stage 1 through stage 4 chronic kidney disease, or unspecified chronic kidney disease: Secondary | ICD-10-CM | POA: Diagnosis not present

## 2017-05-13 DIAGNOSIS — E782 Mixed hyperlipidemia: Secondary | ICD-10-CM | POA: Diagnosis not present

## 2017-05-13 DIAGNOSIS — E8770 Fluid overload, unspecified: Secondary | ICD-10-CM | POA: Diagnosis not present

## 2017-05-18 ENCOUNTER — Other Ambulatory Visit: Payer: Self-pay | Admitting: Interventional Cardiology

## 2017-05-22 NOTE — Progress Notes (Signed)
Cardiology Office Note    Date:  05/23/2017   ID:  [name], DOB 12/17/1933, MRN [id number]  PCP:  Hulan Fess, MD  Cardiologist: Sinclair Grooms, MD   Chief Complaint  Patient presents with  . Coronary Artery Disease    History of Present Illness:  [name] is a 83 y.o. male who presents for follow-up of coronary artery disease, abdominal aortic aneurysm, hypertension, review his coronary bypass grafting, and recent lower GI bleeding possibly from diverticulosis.   Now carries a diagnosis of cognitive impairment/Alzheimer's.  He denies chest discomfort, dyspnea, palpitations, syncope, edema, orthopnea, and PND. He has not needed to use nitroglycerin. No transient neurological complaints. He no longer drives his car.   Past Medical History:  Diagnosis Date  . Coronary artery disease   . Diabetes mellitus without complication ([place])   . Hyperlipidemia   . Hypertension   . Memory difficulty 03/06/2017  . Toxic goiter     Past Surgical History:  Procedure Laterality Date  . ABDOMINAL AORTIC ANEURYSM REPAIR    . cardiac bypass    . CORONARY ARTERY BYPASS GRAFT      Current Medications: Outpatient Medications Prior to Visit  Medication Sig Dispense Refill  . acetaminophen (TYLENOL) 650 MG CR tablet Take 650 mg by mouth every 8 (eight) hours as needed for pain. 1/2 tablet as needed    . aspirin EC 81 MG tablet Take 81 mg by mouth daily.    Marland Kitchen atorvastatin (LIPITOR) 40 MG tablet Take 40 mg by mouth daily.    Marland Kitchen augmented betamethasone dipropionate (DIPROLENE-AF) 0.05 % ointment Apply 1 application topically as needed.    . donepezil (ARICEPT) 5 MG tablet Take 1 tablet (5 mg total) by mouth at bedtime. 30 tablet 1  . ketoconazole (NIZORAL) 2 % cream Apply 1 application topically daily.    Marland Kitchen levothyroxine (SYNTHROID, LEVOTHROID) 75 MCG tablet Take 75 mcg by mouth daily before breakfast.    . loratadine (CLARITIN) 10 MG tablet Take 10 mg by mouth daily.    .  metoprolol succinate (TOPROL-XL) 25 MG 24 hr tablet TAKE ONE TABLET BY MOUTH ONCE DAILY 90 tablet 0  . Multiple Vitamins-Minerals (MULTIVITAMIN ADULTS PO) Take 1 tablet by mouth daily.    . niacin (NIASPAN) 1000 MG CR tablet Take 1,000 mg by mouth 2 (two) times daily.    . potassium chloride (K-DUR,KLOR-CON) 10 MEQ tablet Take 10 mEq by mouth 2 (two) times daily.    . sertraline (ZOLOFT) 50 MG tablet Take 50 mg by mouth daily.    . valACYclovir (VALTREX) 500 MG tablet Take 500 mg by mouth as needed.    . calcitRIOL (ROCALTROL) 0.25 MCG capsule Take 0.5-0.75 mcg by mouth daily. Alternate take 2 capsules one day then 3 capsules the next day    . furosemide (LASIX) 80 MG tablet Take 80-160 mg by mouth See admin instructions. Take 2 tablets every morning and take 1 tablet at noon    . insulin aspart protamine- aspart (NOVOLOG MIX 70/30) (70-30) 100 UNIT/ML injection Inject 30 Units into the skin 3 (three) times daily. Inject 60 units into the skin every morning and inject 40 units into the skin every evening.     Marland Kitchen LANTUS 100 UNIT/ML injection Inject 60 Units into the skin 2 (two) times daily.    Marland Kitchen omeprazole (PRILOSEC) 20 MG capsule Take 20 mg by mouth daily.     No facility-administered medications prior to visit.  Allergies:   Patient has no known allergies.   Social History   Social History  . Marital status: Married    Spouse name: [name]  . Number of children: 3  . Years of education: 65   Social History Main Topics  . Smoking status: Former Smoker    Packs/day: 1.00    Quit date: 03/31/1996  . Smokeless tobacco: Never Used  . Alcohol use No  . Drug use: No  . Sexual activity: Yes    Birth control/ protection: None   Other Topics Concern  . None   Social History Narrative   Lives  With spouse   Caffeine use:  Coffee ocass   Tea/soda daily   Right-handed     Family History:  [name] includes Diabetes in his brother, brother, brother, and sister;  Heart attack in his brother, brother, father, and sister; Stroke in his father and mother.   ROS:   Please see the history of present illness.    Vision disturbance, decreased appetite, weight loss due to change in diet, excessive fatigue, diarrhea, depression, muscle pain, rash, easy bruising. Recent significant control issues with diabetes.  All other systems reviewed and are negative.   PHYSICAL EXAM:   VS:  BP 106/66 (BP Location: Left Arm)   Pulse (!) 59   Ht 5\' 10"  (1.778 m)   Wt 232 lb 12.8 oz (105.6 kg)   BMI 33.40 kg/m    GEN: Well nourished, well developed, in no acute distress  HEENT: normal  Neck: no JVD, carotid bruits, or masses Cardiac: RRR; no murmurs, rubs, or gallops,no edema  Respiratory:  clear to auscultation bilaterally, normal work of breathing GI: soft, nontender, nondistended, + BS MS: no deformity or atrophy  Skin: warm and dry, no rash Neuro:  Alert and Oriented x 3, Strength and sensation are intact Psych: euthymic mood, full affect  Wt Readings from Last 3 Encounters:  05/23/17 232 lb 12.8 oz (105.6 kg)  03/06/17 220 lb 8 oz (100 kg)  05/10/16 233 lb (105.7 kg)      Studies/Labs Reviewed:   EKG:  EKG  Sinus bradycardia, nonspecific T wave abnormality, poor R-wave progression.  Recent Labs: No results found for requested labs within last 8760 hours.   Lipid Panel    Component Value Date/Time   CHOL 93 03/31/2016 0306   TRIG 102 03/31/2016 0306   HDL 35 (L) 03/31/2016 0306   CHOLHDL 2.7 03/31/2016 0306   VLDL 20 03/31/2016 0306   LDLCALC 38 03/31/2016 0306    Additional studies/ records that were reviewed today include:  No new data  Most recent LDL cholesterol 5105/12/2016. Hemoglobin A1c 7.7. BUN and creatinine 24 and 1.95.    ASSESSMENT:    1. Coronary artery disease involving coronary bypass graft of native heart without angina pectoris   2. Essential hypertension   3. Abdominal aortic aneurysm (AAA) without rupture ([place])     4. Diabetes mellitus due to underlying condition with diabetic nephropathy, with long-term current use of insulin ([place])   5. Chronic kidney disease, stage IV (severe) ([place])   6. Hyperlipidemia, unspecified hyperlipidemia type      PLAN:  In order of problems listed above:  1. No obvious anginal/ischemic complaints. Continue to monitor for angina. Use nitroglycerin if angina develops. Notify us if angina develops. 2. Control blood pressure, actually relatively low. No changes recommended at this time. 3. Endovascular therapy of abdominal aortic aneurysm has lead to continued shrinking of  the aneurysm over time. Last note from Dr. Gae Gallop: "STATUS POST ENDOVASCULAR REPAIR OF ABDOMINAL AORTIC ANEURYSM: the aneurysm continues to shrink in size and is now 3.2 cm in maximum diameter. I recommended a follow up ultrasound in 2 years. We will see him back at that time. He knows to call sooner if he has problems" 05/24/2015. 4. Not addressed but above A1c suggests we are not quite at target control. According to the wife this is improved compared to 6 months ago. 5. Stage for CKD being followed by Dr. Jimmy Footman. 6. LDL target less than 70.  Overall, the major development is a diagnosis of dementia. Cardiovascular management will be conservative. Clinical follow-up in 1 years in no acute cardiac complaints. No change in therapy.  Medication Adjustments/Labs and Tests Ordered: Current medicines are reviewed at length with the patient today.  Concerns regarding medicines are outlined above.  Medication changes, Labs and Tests ordered today are listed in the Patient Instructions below. Patient Instructions  Medication Instructions:  Your physician recommends that you continue on your current medications as directed. Please refer to the Current Medication list given to you today.   Labwork: None ordered  Testing/Procedures: None ordered  Follow-Up: Your physician wants you to follow-up in 1  year with Dr. Tamala Julian. You will receive a reminder letter in the mail two months in advance. If you don't receive a letter, please call our office to schedule the follow-up appointment.  Any Other Special Instructions Will Be Listed Below (If Applicable).     If you need a refill on your cardiac medications before your next appointment, please call your pharmacy.      Signed, Sinclair Grooms, MD  05/23/2017 6:44 PM    [facility] [address] [place], [place], [place]  [address] Phone: [phone]; Fax: [phone]

## 2017-05-23 ENCOUNTER — Ambulatory Visit (INDEPENDENT_AMBULATORY_CARE_PROVIDER_SITE_OTHER): Payer: PPO | Admitting: Interventional Cardiology

## 2017-05-23 ENCOUNTER — Encounter: Payer: Self-pay | Admitting: Interventional Cardiology

## 2017-05-23 VITALS — BP 106/66 | HR 59 | Ht 70.0 in | Wt 232.8 lb

## 2017-05-23 DIAGNOSIS — I25810 Atherosclerosis of coronary artery bypass graft(s) without angina pectoris: Secondary | ICD-10-CM | POA: Diagnosis not present

## 2017-05-23 DIAGNOSIS — I714 Abdominal aortic aneurysm, without rupture, unspecified: Secondary | ICD-10-CM

## 2017-05-23 DIAGNOSIS — Z794 Long term (current) use of insulin: Secondary | ICD-10-CM | POA: Diagnosis not present

## 2017-05-23 DIAGNOSIS — N184 Chronic kidney disease, stage 4 (severe): Secondary | ICD-10-CM | POA: Diagnosis not present

## 2017-05-23 DIAGNOSIS — I10 Essential (primary) hypertension: Secondary | ICD-10-CM | POA: Diagnosis not present

## 2017-05-23 DIAGNOSIS — E785 Hyperlipidemia, unspecified: Secondary | ICD-10-CM | POA: Diagnosis not present

## 2017-05-23 DIAGNOSIS — E0821 Diabetes mellitus due to underlying condition with diabetic nephropathy: Secondary | ICD-10-CM

## 2017-05-23 NOTE — Patient Instructions (Signed)

## 2017-05-27 ENCOUNTER — Encounter: Payer: Self-pay | Admitting: *Deleted

## 2017-05-27 ENCOUNTER — Other Ambulatory Visit: Payer: Self-pay | Admitting: Neurology

## 2017-05-27 MED ORDER — DONEPEZIL HCL 10 MG PO TABS: 10.0000 mg | ORAL_TABLET | Freq: Every day | ORAL | 3 refills | Status: DC

## 2017-05-27 NOTE — Telephone Encounter (Signed)
Called and spoke with wife. Patient tolerating Aricept 5mg  tablet. No SE. Advised we will call in 10mg  tablet. Advised them to call if he has any SE.  Clarified with her that medication does not improve memory, it helps slow down process of memory loss. She verbalized understanding.

## 2017-05-28 ENCOUNTER — Other Ambulatory Visit (HOSPITAL_COMMUNITY): Payer: PPO

## 2017-05-28 ENCOUNTER — Ambulatory Visit: Payer: PPO | Admitting: Vascular Surgery

## 2017-06-03 DIAGNOSIS — I129 Hypertensive chronic kidney disease with stage 1 through stage 4 chronic kidney disease, or unspecified chronic kidney disease: Secondary | ICD-10-CM | POA: Diagnosis not present

## 2017-06-03 DIAGNOSIS — I10 Essential (primary) hypertension: Secondary | ICD-10-CM | POA: Diagnosis not present

## 2017-06-03 DIAGNOSIS — N184 Chronic kidney disease, stage 4 (severe): Secondary | ICD-10-CM | POA: Diagnosis not present

## 2017-06-04 ENCOUNTER — Other Ambulatory Visit (HOSPITAL_COMMUNITY): Payer: PPO

## 2017-06-04 ENCOUNTER — Ambulatory Visit: Payer: PPO | Admitting: Vascular Surgery

## 2017-06-05 DIAGNOSIS — E782 Mixed hyperlipidemia: Secondary | ICD-10-CM | POA: Diagnosis not present

## 2017-06-05 DIAGNOSIS — R609 Edema, unspecified: Secondary | ICD-10-CM | POA: Diagnosis not present

## 2017-06-05 DIAGNOSIS — D582 Other hemoglobinopathies: Secondary | ICD-10-CM | POA: Diagnosis not present

## 2017-06-05 DIAGNOSIS — R8290 Unspecified abnormal findings in urine: Secondary | ICD-10-CM | POA: Diagnosis not present

## 2017-06-05 DIAGNOSIS — E1122 Type 2 diabetes mellitus with diabetic chronic kidney disease: Secondary | ICD-10-CM | POA: Diagnosis not present

## 2017-06-05 DIAGNOSIS — H6120 Impacted cerumen, unspecified ear: Secondary | ICD-10-CM | POA: Diagnosis not present

## 2017-06-05 DIAGNOSIS — E042 Nontoxic multinodular goiter: Secondary | ICD-10-CM | POA: Diagnosis not present

## 2017-06-05 DIAGNOSIS — I712 Thoracic aortic aneurysm, without rupture: Secondary | ICD-10-CM | POA: Diagnosis not present

## 2017-06-05 DIAGNOSIS — D696 Thrombocytopenia, unspecified: Secondary | ICD-10-CM | POA: Diagnosis not present

## 2017-06-05 DIAGNOSIS — I25119 Atherosclerotic heart disease of native coronary artery with unspecified angina pectoris: Secondary | ICD-10-CM | POA: Diagnosis not present

## 2017-06-05 DIAGNOSIS — Z0000 Encounter for general adult medical examination without abnormal findings: Secondary | ICD-10-CM | POA: Diagnosis not present

## 2017-06-05 DIAGNOSIS — N183 Chronic kidney disease, stage 3 (moderate): Secondary | ICD-10-CM | POA: Diagnosis not present

## 2017-06-05 DIAGNOSIS — I4891 Unspecified atrial fibrillation: Secondary | ICD-10-CM | POA: Diagnosis not present

## 2017-06-05 DIAGNOSIS — I10 Essential (primary) hypertension: Secondary | ICD-10-CM | POA: Diagnosis not present

## 2017-06-17 ENCOUNTER — Ambulatory Visit: Payer: PPO | Admitting: Podiatry

## 2017-07-15 ENCOUNTER — Encounter: Payer: Self-pay | Admitting: Podiatry

## 2017-07-15 ENCOUNTER — Ambulatory Visit (INDEPENDENT_AMBULATORY_CARE_PROVIDER_SITE_OTHER): Payer: PPO | Admitting: Podiatry

## 2017-07-15 DIAGNOSIS — M79675 Pain in left toe(s): Secondary | ICD-10-CM | POA: Diagnosis not present

## 2017-07-15 DIAGNOSIS — M79674 Pain in right toe(s): Secondary | ICD-10-CM | POA: Diagnosis not present

## 2017-07-15 DIAGNOSIS — E1142 Type 2 diabetes mellitus with diabetic polyneuropathy: Secondary | ICD-10-CM | POA: Diagnosis not present

## 2017-07-15 DIAGNOSIS — B351 Tinea unguium: Secondary | ICD-10-CM | POA: Diagnosis not present

## 2017-07-15 NOTE — Progress Notes (Signed)
   Subjective:    Patient ID: [name], male    DOB: 08/31/1934, 83 y.o.   MRN: [id number]  HPI    Review of Systems  All other systems reviewed and are negative.      Objective:   Physical Exam        Assessment & Plan:

## 2017-07-15 NOTE — Progress Notes (Signed)
Patient ID: [name], male   DOB: June 14, 1934, 83 y.o.   MRN: [id number]   Subjective: This patient presents for scheduled visit complaining of uncomfortable toenails when walking wearing shoes and requests toenail debridement.  Objective: Orientated 3  Vascular: Peripheral pitting edema bilaterally DP 0/4 bilaterally PT pulses 2/4 bilaterally Capillary reflex immediate bilaterally  Neurological: Sensation to 10 g monofilament wire intact 2/5 right 1/5 left Vibratory sensation nonreactive bilaterally Ankle reflexes reactive bilaterally  Dermatological: No open skin lesions bilaterally Atrophic skin with absent hair growth bilaterally Dry skin bilaterally The toenails are extremely elongated, hypertrophic, discolored, deformed and tender direct palpation 6-10  Musculoskeletal: No deformities noted bilaterally There is no crepitus or restriction range of motion ankle, subtalar, or midtarsal joints bilaterally  Assessment: Decreased DP pulses bilaterally without history of claudication or open wounds Diabetic peripheral neuropathy Diabetic peripheral vascular disease Neglected symptomatic onychomycoses 6-10  Plan: The toenails 6-10 were debrided mechanically and electrically without any bleeding  Reappoint 3 months

## 2017-07-15 NOTE — Patient Instructions (Signed)

## 2017-07-22 DIAGNOSIS — L570 Actinic keratosis: Secondary | ICD-10-CM | POA: Diagnosis not present

## 2017-07-22 DIAGNOSIS — Z85828 Personal history of other malignant neoplasm of skin: Secondary | ICD-10-CM | POA: Diagnosis not present

## 2017-07-22 DIAGNOSIS — Z08 Encounter for follow-up examination after completed treatment for malignant neoplasm: Secondary | ICD-10-CM | POA: Diagnosis not present

## 2017-07-22 DIAGNOSIS — X32XXXD Exposure to sunlight, subsequent encounter: Secondary | ICD-10-CM | POA: Diagnosis not present

## 2017-07-22 DIAGNOSIS — Z1283 Encounter for screening for malignant neoplasm of skin: Secondary | ICD-10-CM | POA: Diagnosis not present

## 2017-07-22 DIAGNOSIS — L821 Other seborrheic keratosis: Secondary | ICD-10-CM | POA: Diagnosis not present

## 2017-07-23 DIAGNOSIS — R0681 Apnea, not elsewhere classified: Secondary | ICD-10-CM | POA: Diagnosis not present

## 2017-08-06 ENCOUNTER — Other Ambulatory Visit (HOSPITAL_COMMUNITY): Payer: PPO

## 2017-08-06 ENCOUNTER — Ambulatory Visit: Payer: PPO | Admitting: Vascular Surgery

## 2017-08-13 ENCOUNTER — Ambulatory Visit: Payer: PPO | Admitting: Vascular Surgery

## 2017-08-27 ENCOUNTER — Ambulatory Visit (HOSPITAL_COMMUNITY)
Admission: RE | Admit: 2017-08-27 | Discharge: 2017-08-27 | Disposition: A | Discharge status: Home / Self Care | Payer: PPO | Source: Ambulatory Visit | Attending: Vascular Surgery | Admitting: Vascular Surgery

## 2017-08-27 ENCOUNTER — Encounter: Payer: Self-pay | Admitting: Vascular Surgery

## 2017-08-27 ENCOUNTER — Other Ambulatory Visit (HOSPITAL_COMMUNITY): Payer: PPO

## 2017-08-27 ENCOUNTER — Ambulatory Visit: Payer: PPO | Admitting: Vascular Surgery

## 2017-08-27 ENCOUNTER — Other Ambulatory Visit: Payer: Self-pay | Admitting: Interventional Cardiology

## 2017-08-27 ENCOUNTER — Ambulatory Visit (INDEPENDENT_AMBULATORY_CARE_PROVIDER_SITE_OTHER): Payer: PPO | Admitting: Vascular Surgery

## 2017-08-27 VITALS — BP 138/76 | HR 55 | Temp 97.2°F | Resp 20 | Ht 70.0 in | Wt 236.0 lb

## 2017-08-27 DIAGNOSIS — I714 Abdominal aortic aneurysm, without rupture, unspecified: Secondary | ICD-10-CM

## 2017-08-27 DIAGNOSIS — Z48812 Encounter for surgical aftercare following surgery on the circulatory system: Secondary | ICD-10-CM | POA: Diagnosis not present

## 2017-08-27 NOTE — Progress Notes (Signed)
Patient name: [name] MRN: [id number] DOB: June 04, 1934 Sex: male  REASON FOR VISIT:    Follow up after endovascular aneurysm repair.  HPI:   [name] is a pleasant 83 y.o. male who underwent an endovascular aneurysm repair by Dr. Drucie Opitz 8 years ago. When I last saw the patient on 05/24/2015, the aneurysm measured 3.2 cm in maximum diameter and was gradually decreasing in size. I recommended a 2 year follow up visit.  Since I saw him last, he denies any abdominal pain. He does have some mild chronic low back pain. He is not a smoker. He is on aspirin and is on a statin.  He denies significant claudication although his activity is limited.  He does have some mild bilateral lower extremity swelling which is chronic. This has been stable.  Past Medical History:  Diagnosis Date  . Coronary artery disease   . Diabetes mellitus without complication ([place])   . Hyperlipidemia   . Hypertension   . Memory difficulty 03/06/2017  . Toxic goiter     Family History  Problem Relation Age of Onset  . Stroke Mother   . Stroke Father   . Heart attack Father   . Diabetes Sister   . Heart attack Sister   . Diabetes Brother   . Heart attack Brother   . Diabetes Brother   . Diabetes Brother   . Heart attack Brother     SOCIAL HISTORY: Social History  Substance Use Topics  . Smoking status: Former Smoker    Packs/day: 1.00    Quit date: 03/31/1996  . Smokeless tobacco: Never Used  . Alcohol use No    No Known Allergies  Current Outpatient Prescriptions  Medication Sig Dispense Refill  . acetaminophen (TYLENOL) 650 MG CR tablet Take 650 mg by mouth every 8 (eight) hours as needed for pain. 1/2 tablet as needed    . aspirin EC 81 MG tablet Take 81 mg by mouth daily.    Marland Kitchen atorvastatin (LIPITOR) 40 MG tablet Take 40 mg by mouth daily.    Marland Kitchen augmented betamethasone dipropionate (DIPROLENE-AF) 0.05 % ointment Apply 1 application topically as needed.    . calcitRIOL (ROCALTROL)  0.25 MCG capsule Take 0.5 mcg by mouth daily.    . Clotrimazole-Betamethasone (LOTRISONE EX) Apply 1 application topically 2 (two) times daily as needed (rash).    . donepezil (ARICEPT) 10 MG tablet Take 1 tablet (10 mg total) by mouth at bedtime. 30 tablet 3  . furosemide (LASIX) 80 MG tablet Take 160 mg by mouth daily on Sunday, Monday, Wednesday, and Friday. Take 80 mg by mouth daily on Tuesday, Thursday, and Saturday.    Marland Kitchen ketoconazole (NIZORAL) 2 % cream Apply 1 application topically daily.    Marland Kitchen LANTUS 100 UNIT/ML injection Inject 40 Units into the skin 2 (two) times daily.    Marland Kitchen levothyroxine (SYNTHROID, LEVOTHROID) 75 MCG tablet Take 75 mcg by mouth daily before breakfast.    . loratadine (CLARITIN) 10 MG tablet Take 10 mg by mouth daily.    . metoprolol succinate (TOPROL-XL) 25 MG 24 hr tablet TAKE ONE TABLET BY MOUTH ONCE DAILY 90 tablet 0  . Multiple Vitamins-Minerals (MULTIVITAMIN ADULTS PO) Take 1 tablet by mouth daily.    . niacin (NIASPAN) 1000 MG CR tablet Take 1,000 mg by mouth 2 (two) times daily.    Marland Kitchen NOVOLOG 100 UNIT/ML injection Inject 25-30 Units into the skin 3 (three) times daily with meals.    Marland Kitchen  omeprazole (PRILOSEC) 20 MG capsule Take 20 mg by mouth daily as needed (heartburn).    . potassium chloride (K-DUR,KLOR-CON) 10 MEQ tablet Take 10 mEq by mouth 2 (two) times daily.    . sertraline (ZOLOFT) 50 MG tablet Take 50 mg by mouth daily.    . valACYclovir (VALTREX) 500 MG tablet Take 500 mg by mouth as needed.     No current facility-administered medications for this visit.     REVIEW OF SYSTEMS:  [X]  denotes positive finding, [ ]  denotes negative finding Cardiac  Comments:  Chest pain or chest pressure:    Shortness of breath upon exertion:    Short of breath when lying flat:    Irregular heart rhythm:        Vascular    Pain in calf, thigh, or hip brought on by ambulation:    Pain in feet at night that wakes you up from your sleep:     Blood clot in your veins:     Leg swelling:  X Chronic bilateral       Pulmonary    Oxygen at home:    Productive cough:     Wheezing:         Neurologic    Sudden weakness in arms or legs:     Sudden numbness in arms or legs:     Sudden onset of difficulty speaking or slurred speech:    Temporary loss of vision in one eye:     Problems with dizziness:         Gastrointestinal    Blood in stool:     Vomited blood:         Genitourinary    Burning when urinating:     Blood in urine:        Psychiatric    Major depression:         Hematologic    Bleeding problems:    Problems with blood clotting too easily:        Skin    Rashes or ulcers:        Constitutional    Fever or chills:     PHYSICAL EXAM:   Vitals:   08/27/17 0944  BP: 138/76  Pulse: (!) 55  Resp: 20  Temp: (!) 97.2 F (36.2 C)  SpO2: 97%  Weight: 236 lb (107 kg)  Height: 5\' 10"  (1.778 m)    GENERAL: The patient is a well-nourished male, in no acute distress. The vital signs are documented above. CARDIAC: There is a regular rate and rhythm.  VASCULAR: I do not detect carotid bruits. PULMONARY: There is good air exchange bilaterally without wheezing or rales. ABDOMEN: Soft and non-tender with normal pitched bowel sounds. These cause of his size I cannot palpate his aneurysm. MUSCULOSKELETAL: There are no major deformities or cyanosis. NEUROLOGIC: No focal weakness or paresthesias are detected. SKIN: There are no ulcers or rashes noted. PSYCHIATRIC: The patient has a normal affect.  DATA:    DUPLEX OF ABDOMINAL AORTA: I have independently interpreted the duplex of his abdominal aorta which shows that the maximum diameter of his aorta is 3.3 cm which is stable in size. The right common iliac artery measures 1.5 cm in maximum diameter. The left common iliac artery measures 1.4 cm in maximum diameter.  MEDICAL ISSUES:   STATUS POST ENDOVASCULAR ANEURYSM REPAIR IN 2008: His aneurysm remains stable in size and is quite small  at 3.3 cm. I think we can continue his follow up  in 2 years. I were to follow up carotid duplex scan 2 years LC and back at that time. He is to call sooner if he has problems. He is on aspirin and is on a statin.  BILATERAL LOWER EXTREMITY SWELLING: He has chronic bilateral artery swelling. I've encouraged him to elevate his legs as much as possible.  Deitra [facility] [phone]

## 2017-09-02 DIAGNOSIS — G4733 Obstructive sleep apnea (adult) (pediatric): Secondary | ICD-10-CM | POA: Diagnosis not present

## 2017-09-16 ENCOUNTER — Ambulatory Visit (INDEPENDENT_AMBULATORY_CARE_PROVIDER_SITE_OTHER): Payer: PPO | Admitting: Neurology

## 2017-09-16 ENCOUNTER — Encounter: Payer: Self-pay | Admitting: Neurology

## 2017-09-16 VITALS — BP 154/79 | HR 54 | Ht 70.0 in | Wt 239.0 lb

## 2017-09-16 DIAGNOSIS — R413 Other amnesia: Secondary | ICD-10-CM

## 2017-09-16 DIAGNOSIS — E1140 Type 2 diabetes mellitus with diabetic neuropathy, unspecified: Secondary | ICD-10-CM

## 2017-09-16 DIAGNOSIS — E1142 Type 2 diabetes mellitus with diabetic polyneuropathy: Secondary | ICD-10-CM

## 2017-09-16 DIAGNOSIS — R269 Unspecified abnormalities of gait and mobility: Secondary | ICD-10-CM

## 2017-09-16 HISTORY — DX: Type 2 diabetes mellitus with diabetic neuropathy, unspecified: E11.40

## 2017-09-16 HISTORY — DX: Unspecified abnormalities of gait and mobility: R26.9

## 2017-09-16 MED ORDER — DONEPEZIL HCL 10 MG PO TABS: 10.0000 mg | ORAL_TABLET | Freq: Every day | ORAL | 3 refills | Status: DC

## 2017-09-16 NOTE — Progress Notes (Signed)
Reason for visit: Memory disturbance  [name] is an 83 y.o. male  History of present illness:  [name] is an 83 year old right-handed white male with a history of a mild memory disturbance. The patient has a history of diabetes, his diabetes was out of control and this exacerbated his memory issue. He was forgetting to take his medications properly. He has done better with his diabetes, he has had occasional episodes of low blood sugar. He is on Aricept taking 10 mg at night and is tolerating the dose well. He has had a sleep Mcmillan done that did not show evidence of significant sleep apnea. He has had some mild gait instability for quite a number of years that has gradually worsened over time, he does report some numbness and discomfort in the feet. He has had one fall 2 months ago. He does not use a cane or walker for ambulation. He is sleeping fairly well, the discomfort in the feet does not bother him enough to keep him awake. He returns to this office for an evaluation.   Past Medical History:  Diagnosis Date  . Coronary artery disease   . Diabetes mellitus without complication ([place])   . Hyperlipidemia   . Hypertension   . Memory difficulty 03/06/2017  . Toxic goiter     Past Surgical History:  Procedure Laterality Date  . ABDOMINAL AORTIC ANEURYSM REPAIR    . cardiac bypass    . CORONARY ARTERY BYPASS GRAFT      Family History  Problem Relation Age of Onset  . Stroke Mother   . Stroke Father   . Heart attack Father   . Diabetes Sister   . Heart attack Sister   . Diabetes Brother   . Heart attack Brother   . Diabetes Brother   . Diabetes Brother   . Heart attack Brother     Social history:  reports that he quit smoking about 21 years ago. He smoked 1.00 pack per day. He has never used smokeless tobacco. He reports that he does not drink alcohol or use drugs.   No Known Allergies  Medications:  Prior to Admission medications   Medication Sig Start Date End  Date Taking? Authorizing Provider  acetaminophen (TYLENOL) 650 MG CR tablet Take 325 mg by mouth every 8 (eight) hours as needed for pain. 1/2 tablet as needed    Yes [provider]  aspirin EC 81 MG tablet Take 81 mg by mouth daily.    [provider]  atorvastatin (LIPITOR) 40 MG tablet Take 40 mg by mouth daily.    [provider]  augmented betamethasone dipropionate (DIPROLENE-AF) 0.05 % ointment Apply 1 application topically as needed.    [provider]  calcitRIOL (ROCALTROL) 0.25 MCG capsule Take 0.5 mcg by mouth daily. 04/29/17   [provider]  Clotrimazole-Betamethasone (LOTRISONE EX) Apply 1 application topically 2 (two) times daily as needed (rash).    [provider]  donepezil (ARICEPT) 10 MG tablet Take 1 tablet (10 mg total) by mouth at bedtime. 05/27/17   Kathrynn Ducking, MD  furosemide (LASIX) 80 MG tablet Take 160 mg by mouth daily on Sunday, Monday, Wednesday, and Friday. Take 80 mg by mouth daily on Tuesday, Thursday, and Saturday. 05/18/17   [provider]  ketoconazole (NIZORAL) 2 % cream Apply 1 application topically daily.    [provider]  LANTUS 100 UNIT/ML injection Inject 40 Units into the skin 2 (two) times  daily. 03/14/17   [provider]  levothyroxine (SYNTHROID, LEVOTHROID) 75 MCG tablet Take 75 mcg by mouth daily before breakfast.    [provider]  loratadine (CLARITIN) 10 MG tablet Take 10 mg by mouth daily.    [provider]  metoprolol succinate (TOPROL-XL) 25 MG 24 hr tablet TAKE 1 TABLET BY MOUTH ONCE DAILY 08/27/17   Belva Crome, MD  Multiple Vitamins-Minerals (MULTIVITAMIN ADULTS PO) Take 1 tablet by mouth daily.    [provider]  niacin (NIASPAN) 1000 MG CR tablet Take 1,000 mg by mouth 2 (two) times daily.    [provider]  NOVOLOG 100 UNIT/ML injection Inject 25-30 Units into the skin 3 (three) times daily with meals. 03/18/17    [provider]  potassium chloride (K-DUR,KLOR-CON) 10 MEQ tablet Take 10 mEq by mouth 2 (two) times daily.    [provider]  sertraline (ZOLOFT) 50 MG tablet Take 50 mg by mouth daily.    [provider]  valACYclovir (VALTREX) 500 MG tablet Take 500 mg by mouth as needed.    [provider]    ROS:  Out of a complete 14 system review of symptoms, the patient complains only of the following symptoms, and all other reviewed systems are negative.  Leg swelling Excessive thirst Walking difficulty  Blood pressure (!) 154/79, pulse (!) 54, height 5\' 10"  (1.778 m), weight 239 lb (108.4 kg).  Physical Exam  General: The patient is alert and cooperative at the time of the examination. The patient is moderately to markedly obese.  Skin: 3+ edema below the knees is seen bilaterally.   Neurologic Exam  Mental status: The patient is alert and oriented x 3 at the time of the examination. The patient has apparent normal recent and remote memory, with an apparently normal attention span and concentration ability. The Mini-Mental Status Examination done today shows a total score 29/30.   Cranial nerves: Facial symmetry is present. Speech is normal, no aphasia or dysarthria is noted. Extraocular movements are full. Visual fields are full.  Motor: The patient has good strength in all 4 extremities, with exception of mild bilateral foot drops.  Sensory examination: Soft touch sensation is symmetric on the face, arms, and legs. The patient has a stocking pattern pinprick sensory deficit up to the knees bilaterally.  Coordination: The patient has good finger-nose-finger and heel-to-shin bilaterally.  Gait and station: The patient has a slightly wide-based gait, the patient is able to ambulate independently. Tandem gait is unsteady. Romberg is negative. No drift is seen.  Reflexes: Deep tendon reflexes are symmetric, but are depressed.   MRI Brain  03/26/17:  IMPRESSION: This MRI of the brain without contrast shows the following: 1. Moderate cortical atrophy that is a little more pronounced in the mesial temporal lobes. 2. Chronic lacunar infarctions in the right basal ganglia and right internal capsule. Additionally, there are scattered T2/FLAIR hyperintense foci in both hemispheres and the right thalamus consistent with chronic microvascular ischemic changes.  3. There are no acute findings.  * MRI scan images were reviewed online. I agree with the written report.    Assessment/Plan:  1. Mild memory disturbance  2. Diabetic peripheral neuropathy  3. Gait disorder  The patient is having a mild problem with gait instability, I have recommended using a cane outside the house. The patient will remain on Aricept for now taking 10 mg at night. He is tolerating this well. He will follow-up in 6 months, sooner  if needed.   Jill Alexanders MD 09/16/2017 4:07 PM  [facility] [address] [place], [place] [address]  Phone [phone] Fax [phone]

## 2017-09-24 ENCOUNTER — Ambulatory Visit: Payer: PPO | Admitting: Vascular Surgery

## 2017-09-24 ENCOUNTER — Other Ambulatory Visit (HOSPITAL_COMMUNITY): Payer: PPO

## 2017-09-24 DIAGNOSIS — Z23 Encounter for immunization: Secondary | ICD-10-CM | POA: Diagnosis not present

## 2017-09-24 DIAGNOSIS — E890 Postprocedural hypothyroidism: Secondary | ICD-10-CM | POA: Diagnosis not present

## 2017-09-24 DIAGNOSIS — E1165 Type 2 diabetes mellitus with hyperglycemia: Secondary | ICD-10-CM | POA: Diagnosis not present

## 2017-10-15 ENCOUNTER — Ambulatory Visit: Payer: PPO | Admitting: Vascular Surgery

## 2017-10-15 ENCOUNTER — Other Ambulatory Visit (HOSPITAL_COMMUNITY): Payer: PPO

## 2017-10-20 ENCOUNTER — Encounter: Payer: Self-pay | Admitting: Podiatry

## 2017-10-20 ENCOUNTER — Ambulatory Visit: Payer: PPO | Admitting: Podiatry

## 2017-10-20 DIAGNOSIS — M79674 Pain in right toe(s): Secondary | ICD-10-CM

## 2017-10-20 DIAGNOSIS — E1142 Type 2 diabetes mellitus with diabetic polyneuropathy: Secondary | ICD-10-CM | POA: Diagnosis not present

## 2017-10-20 DIAGNOSIS — B351 Tinea unguium: Secondary | ICD-10-CM

## 2017-10-20 DIAGNOSIS — M79675 Pain in left toe(s): Secondary | ICD-10-CM

## 2017-10-20 NOTE — Patient Instructions (Signed)

## 2017-10-20 NOTE — Progress Notes (Signed)
Patient ID: [name], male   DOB: Jul 06, 1934, 83 y.o.   MRN: [id number]    Subjective: This patient presents for scheduled visit complaining of uncomfortable toenails when walking wearing shoes and requests toenail debridement.  Objective: Orientated 3  Vascular: Peripheral pitting edema bilaterally DP 0/4 bilaterally PT pulses 2/4 bilaterally Capillary reflex immediate bilaterally  Neurological: Sensation to 10 g monofilament wire intact 2/5 right 1/5 left Vibratory sensation nonreactive bilaterally Ankle reflexes reactive bilaterally  Dermatological: No open skin lesions bilaterally Atrophic skin with absent hair growth bilaterally Dry skin bilaterally The toenails are extremely elongated, hypertrophic, discolored, deformed and tender direct palpation 6-10  Musculoskeletal: No deformities noted bilaterally There is no crepitus or restriction range of motion ankle, subtalar, or midtarsal joints bilaterally  Assessment: Decreased DP pulses bilaterally without history of claudication or open wounds Diabetic peripheral neuropathy Diabetic peripheral vascular disease Neglected symptomatic onychomycoses 6-10  Plan: The toenails 6-10 were debrided mechanically and electrically without any bleeding  Reappoint 3 months

## 2017-10-22 DIAGNOSIS — Z1283 Encounter for screening for malignant neoplasm of skin: Secondary | ICD-10-CM | POA: Diagnosis not present

## 2017-10-22 DIAGNOSIS — Z08 Encounter for follow-up examination after completed treatment for malignant neoplasm: Secondary | ICD-10-CM | POA: Diagnosis not present

## 2017-10-22 DIAGNOSIS — L281 Prurigo nodularis: Secondary | ICD-10-CM | POA: Diagnosis not present

## 2017-10-22 DIAGNOSIS — Z85820 Personal history of malignant melanoma of skin: Secondary | ICD-10-CM | POA: Diagnosis not present

## 2017-10-22 DIAGNOSIS — L821 Other seborrheic keratosis: Secondary | ICD-10-CM | POA: Diagnosis not present

## 2017-12-18 DIAGNOSIS — E8770 Fluid overload, unspecified: Secondary | ICD-10-CM | POA: Diagnosis not present

## 2017-12-18 DIAGNOSIS — I4891 Unspecified atrial fibrillation: Secondary | ICD-10-CM | POA: Diagnosis not present

## 2017-12-18 DIAGNOSIS — N2581 Secondary hyperparathyroidism of renal origin: Secondary | ICD-10-CM | POA: Diagnosis not present

## 2017-12-18 DIAGNOSIS — I714 Abdominal aortic aneurysm, without rupture: Secondary | ICD-10-CM | POA: Diagnosis not present

## 2017-12-18 DIAGNOSIS — I2510 Atherosclerotic heart disease of native coronary artery without angina pectoris: Secondary | ICD-10-CM | POA: Diagnosis not present

## 2017-12-18 DIAGNOSIS — Z8679 Personal history of other diseases of the circulatory system: Secondary | ICD-10-CM | POA: Diagnosis not present

## 2017-12-18 DIAGNOSIS — E782 Mixed hyperlipidemia: Secondary | ICD-10-CM | POA: Diagnosis not present

## 2017-12-18 DIAGNOSIS — I129 Hypertensive chronic kidney disease with stage 1 through stage 4 chronic kidney disease, or unspecified chronic kidney disease: Secondary | ICD-10-CM | POA: Diagnosis not present

## 2017-12-18 DIAGNOSIS — D631 Anemia in chronic kidney disease: Secondary | ICD-10-CM | POA: Diagnosis not present

## 2017-12-18 DIAGNOSIS — N184 Chronic kidney disease, stage 4 (severe): Secondary | ICD-10-CM | POA: Diagnosis not present

## 2017-12-18 DIAGNOSIS — E1122 Type 2 diabetes mellitus with diabetic chronic kidney disease: Secondary | ICD-10-CM | POA: Diagnosis not present

## 2018-01-19 ENCOUNTER — Ambulatory Visit: Payer: PPO | Admitting: Podiatry

## 2018-01-23 ENCOUNTER — Ambulatory Visit: Payer: PPO | Admitting: Podiatry

## 2018-03-17 ENCOUNTER — Encounter: Payer: Self-pay | Admitting: Adult Health

## 2018-03-17 ENCOUNTER — Ambulatory Visit: Payer: PPO | Admitting: Adult Health

## 2018-03-17 ENCOUNTER — Ambulatory Visit (INDEPENDENT_AMBULATORY_CARE_PROVIDER_SITE_OTHER): Payer: PPO | Admitting: Adult Health

## 2018-03-17 VITALS — BP 145/62 | HR 74 | Ht 70.0 in | Wt 246.0 lb

## 2018-03-17 DIAGNOSIS — R413 Other amnesia: Secondary | ICD-10-CM | POA: Diagnosis not present

## 2018-03-17 DIAGNOSIS — E1142 Type 2 diabetes mellitus with diabetic polyneuropathy: Secondary | ICD-10-CM

## 2018-03-17 DIAGNOSIS — R269 Unspecified abnormalities of gait and mobility: Secondary | ICD-10-CM

## 2018-03-17 NOTE — Progress Notes (Signed)
Reason for visit: Memory disturbance  [name] is an 84 y.o. male  History of present illness:  [name] is an 84 year old right-handed white male with a history of a mild memory disturbance. The patient has a history of diabetes, his diabetes was out of control and this exacerbated his memory issue. He was forgetting to take his medications properly. He has done better with his diabetes, he has had occasional episodes of low blood sugar. He is on Aricept taking 10 mg at night and is tolerating the dose well. He has had a sleep study done that did not show evidence of significant sleep apnea. He has had some mild gait instability for quite a number of years that has gradually worsened over time, he does report some numbness and discomfort in the feet. He has had one fall 2 months ago. He does not use a cane or walker for ambulation. He is sleeping fairly well, the discomfort in the feet does not bother him enough to keep him awake. He returns to this office for an evaluation.   Update 03/17/18: Patient returns for six-month follow-up and is accompanied by his wife.  He continues to take Aricept 10 mg without side effects and without worsening in memory.  Wife is concerned for recent "spacing out" episodes where he will be doing something and then does walk away and states that he does not remember this happening.  He is not lose consciousness during these events.  Wife believes that these events are related sugar levels as they do fluctuate from hypo-to hyper glycemia due to the food that he eats.  He is followed with endocrinologist for diabetic management.  Recommended to try to correlate BG levels with these events to see if there is a correlation.  Blood pressure mildly elevated at today's appointment 145/62 but this is checked at home and typically SBP 120s.  Patient doing well overall and no further complaints or questions during this visit.     Past Medical History:  Diagnosis Date  .  Coronary artery disease   . Diabetes mellitus without complication ([place])   . Diabetic neuropathy ([place]) 09/16/2017   Mild bilateral foot drops  . Gait abnormality 09/16/2017  . Hyperlipidemia   . Hypertension   . Memory difficulty 03/06/2017  . Toxic goiter     Past Surgical History:  Procedure Laterality Date  . ABDOMINAL AORTIC ANEURYSM REPAIR    . cardiac bypass    . CORONARY ARTERY BYPASS GRAFT      Family History  Problem Relation Age of Onset  . Stroke Mother   . Stroke Father   . Heart attack Father   . Diabetes Sister   . Heart attack Sister   . Diabetes Brother   . Heart attack Brother   . Diabetes Brother   . Diabetes Brother   . Heart attack Brother     Social history:  reports that he quit smoking about 21 years ago. He smoked 1.00 pack per day. He has never used smokeless tobacco. He reports that he does not drink alcohol or use drugs.   No Known Allergies  Medications:  Prior to Admission medications   Medication Sig Start Date End Date Taking? Authorizing Provider  acetaminophen (TYLENOL) 650 MG CR tablet Take 325 mg by mouth every 8 (eight) hours as needed for pain. 1/2 tablet as needed    Yes [provider]  aspirin EC 81 MG tablet Take 81 mg by mouth  daily.    [provider]  atorvastatin (LIPITOR) 40 MG tablet Take 40 mg by mouth daily.    [provider]  augmented betamethasone dipropionate (DIPROLENE-AF) 0.05 % ointment Apply 1 application topically as needed.    [provider]  calcitRIOL (ROCALTROL) 0.25 MCG capsule Take 0.5 mcg by mouth daily. 04/29/17   [provider]  Clotrimazole-Betamethasone (LOTRISONE EX) Apply 1 application topically 2 (two) times daily as needed (rash).    [provider]  donepezil (ARICEPT) 10 MG tablet Take 1 tablet (10 mg total) by mouth at bedtime. 05/27/17   Kathrynn Ducking, MD  furosemide (LASIX) 80 MG tablet Take 160 mg by mouth daily on Sunday, Monday,  Wednesday, and Friday. Take 80 mg by mouth daily on Tuesday, Thursday, and Saturday. 05/18/17   [provider]  ketoconazole (NIZORAL) 2 % cream Apply 1 application topically daily.    [provider]  LANTUS 100 UNIT/ML injection Inject 40 Units into the skin 2 (two) times daily. 03/14/17   [provider]  levothyroxine (SYNTHROID, LEVOTHROID) 75 MCG tablet Take 75 mcg by mouth daily before breakfast.    [provider]  loratadine (CLARITIN) 10 MG tablet Take 10 mg by mouth daily.    [provider]  metoprolol succinate (TOPROL-XL) 25 MG 24 hr tablet TAKE 1 TABLET BY MOUTH ONCE DAILY 08/27/17   Belva Crome, MD  Multiple Vitamins-Minerals (MULTIVITAMIN ADULTS PO) Take 1 tablet by mouth daily.    [provider]  niacin (NIASPAN) 1000 MG CR tablet Take 1,000 mg by mouth 2 (two) times daily.    [provider]  NOVOLOG 100 UNIT/ML injection Inject 25-30 Units into the skin 3 (three) times daily with meals. 03/18/17   [provider]  potassium chloride (K-DUR,KLOR-CON) 10 MEQ tablet Take 10 mEq by mouth 2 (two) times daily.    [provider]  sertraline (ZOLOFT) 50 MG tablet Take 50 mg by mouth daily.    [provider]  valACYclovir (VALTREX) 500 MG tablet Take 500 mg by mouth as needed.    [provider]    ROS:  Out of a complete 14 system review of symptoms, the patient complains only of the following symptoms, and all other reviewed systems are negative.  Runny nose, leg swelling, excessive eating, daytime sleepiness, joint swelling, walking difficulty, memory loss, agitation, confusion, depression, and nervous/anxious  Blood pressure (!) 145/62, pulse 74, height 5\' 10"  (1.778 m), weight 246 lb (111.6 kg).  Physical Exam  General: The patient is alert and cooperative at the time of the examination. The patient is moderately to markedly obese.  Skin: 3+ edema below the knees is seen  bilaterally.  Neurologic Exam  Mental status: The patient is alert and oriented x 3 at the time of the examination. The patient has apparent normal recent and remote memory, with an apparently normal attention span and concentration ability.   MMSE - Mini Mental State Exam 03/17/2018 03/06/2017  Orientation to time 5 5  Orientation to Place 5 5  Registration 3 3  Attention/ Calculation 5 5  Recall 2 3  Language- name 2 objects 2 2  Language- repeat 1 1  Language- follow 3 step command 3 3  Language- read & follow direction 1 1  Write a sentence 1 1  Copy design 1 1  Total score 29 30    Cranial nerves: Facial symmetry is present. Speech is normal, no aphasia or dysarthria is  noted. Extraocular movements are full. Visual fields are full.  Motor: The patient has good strength in all 4 extremities, with exception of mild bilateral foot drops.  Sensory examination: Soft touch sensation is symmetric on the face, arms, and legs. The patient has a stocking pattern pinprick sensory deficit up to the knees bilaterally.  Coordination: The patient has good finger-nose-finger and heel-to-shin bilaterally.  Gait and station: The patient has a slightly wide-based gait, the patient is able to ambulate independently. Tandem gait is unsteady. Romberg is negative. No drift is seen.  Reflexes: Deep tendon reflexes are symmetric, but are depressed.   MRI Brain 03/26/17:  IMPRESSION: This MRI of the brain without contrast shows the following: 1. Moderate cortical atrophy that is a little more pronounced in the mesial temporal lobes. 2. Chronic lacunar infarctions in the right basal ganglia and right internal capsule. Additionally, there are scattered T2/FLAIR hyperintense foci in both hemispheres and the right thalamus consistent with chronic microvascular ischemic changes.  3. There are no acute findings.  * MRI scan images were reviewed online. I agree with the written  report.    Assessment/Plan: Mr. Sherod is a 84 year old male who is being followed in this clinic for memory difficulty, diabetic polyneuropathy associated type 2 diabetes and gait abnormality.  He continues to take Aricept 10 mg daily without worsening in his memory.  MMSE today 29/30.   Your Plan:  -aricept 10mg  for memory -Patient's wife will check BG levels during episodes as described in HPI to see if there is a correlation -if not, consider EEG to look for possible seizure activity -f/u endocrinologist for DM -f/u with PCP for HTN and HLD management  Follow up with Jinny Blossom, NP in 6 months or call earlier if needed   Greater than 50% time during this 25 memory loss minute consultation visit was spent on counseling and coordination of care about memory loss, diabetic polyneuropathy, type 2 diabetes, and gait abnormality.  All questions were answered during appointment.  Venancio Poisson, AGNP-BC  [facility] [address] [place], [place] [address]  Phone [phone] Fax [phone]

## 2018-03-17 NOTE — Patient Instructions (Addendum)
Your Plan:  Continue aricept 10mg  for memory  Check sugar levels with episodes of "spacing out"  Follow up with your endocrinologist regarding diabetes  Follow up with your primary care doctor regarding blood pressure and cholesterol  Follow up with Jinny Blossom, NP in 6 months or call earlier if needed    Thank you for coming to see [place] at [facility]. I hope we have been able to provide you high quality care today.  You may receive a patient satisfaction survey over the next few weeks. We would appreciate your feedback and comments so that we may continue to improve ourselves and the health of our patients.

## 2018-03-18 NOTE — Progress Notes (Signed)
I have reviewed and agreed above plan. 

## 2018-03-25 ENCOUNTER — Ambulatory Visit: Payer: PPO | Admitting: Podiatry

## 2018-03-25 ENCOUNTER — Encounter: Payer: Self-pay | Admitting: Podiatry

## 2018-03-25 DIAGNOSIS — E1142 Type 2 diabetes mellitus with diabetic polyneuropathy: Secondary | ICD-10-CM | POA: Diagnosis not present

## 2018-03-25 DIAGNOSIS — M79674 Pain in right toe(s): Secondary | ICD-10-CM | POA: Diagnosis not present

## 2018-03-25 DIAGNOSIS — E1159 Type 2 diabetes mellitus with other circulatory complications: Secondary | ICD-10-CM

## 2018-03-25 DIAGNOSIS — M79675 Pain in left toe(s): Secondary | ICD-10-CM | POA: Diagnosis not present

## 2018-03-25 DIAGNOSIS — B351 Tinea unguium: Secondary | ICD-10-CM

## 2018-03-25 NOTE — Progress Notes (Signed)
Complaint:  Visit Type: Patient returns to my office for continued preventative foot care services. Complaint: Patient states" my nails have grown long and thick and become painful to walk and wear shoes" Patient has been diagnosed with DM with no foot complications. The patient presents for preventative foot care services. No changes to ROS  Podiatric Exam: Vascular: dorsalis pedis and posterior tibial pulses are not  palpable bilateral. Capillary return is immediate. Temperature gradient is WNL. Skin turgor WNL  Sensorium: Diminished  Semmes Weinstein monofilament test. Normal tactile sensation bilaterally. Nail Exam: Pt has thick disfigured discolored nails with subungual debris noted bilateral entire nail hallux through fifth toenails Ulcer Exam: There is no evidence of ulcer or pre-ulcerative changes or infection. Orthopedic Exam: Muscle tone and strength are WNL. No limitations in general ROM. No crepitus or effusions noted. Foot type and digits show no abnormalities. Bony prominences are unremarkable. Skin: No Porokeratosis. No infection or ulcers.  Blackened porokeratosis right heel.  Diagnosis:  Onychomycosis, , Pain in right toe, pain in left toes,  Porokeratosis right heel  Treatment & Plan Procedures and Treatment: Consent by patient was obtained for treatment procedures. The patient understood the discussion of treatment and procedures well. All questions were answered thoroughly reviewed. Debridement of mycotic and hypertrophic toenails, 1 through 5 bilateral and clearing of subungual debris. No ulceration, no infection noted. Debride skin lesion right heel. Return Visit-Office Procedure: Patient instructed to return to the office for a follow up visit 3 months for continued evaluation and treatment.    [name]

## 2018-04-01 DIAGNOSIS — E7800 Pure hypercholesterolemia, unspecified: Secondary | ICD-10-CM | POA: Diagnosis not present

## 2018-04-01 DIAGNOSIS — I10 Essential (primary) hypertension: Secondary | ICD-10-CM | POA: Diagnosis not present

## 2018-04-01 DIAGNOSIS — E1165 Type 2 diabetes mellitus with hyperglycemia: Secondary | ICD-10-CM | POA: Diagnosis not present

## 2018-04-01 DIAGNOSIS — E890 Postprocedural hypothyroidism: Secondary | ICD-10-CM | POA: Diagnosis not present

## 2018-05-05 DIAGNOSIS — D631 Anemia in chronic kidney disease: Secondary | ICD-10-CM | POA: Diagnosis not present

## 2018-05-05 DIAGNOSIS — I129 Hypertensive chronic kidney disease with stage 1 through stage 4 chronic kidney disease, or unspecified chronic kidney disease: Secondary | ICD-10-CM | POA: Diagnosis not present

## 2018-05-05 DIAGNOSIS — Z8679 Personal history of other diseases of the circulatory system: Secondary | ICD-10-CM | POA: Diagnosis not present

## 2018-05-05 DIAGNOSIS — N2581 Secondary hyperparathyroidism of renal origin: Secondary | ICD-10-CM | POA: Diagnosis not present

## 2018-05-05 DIAGNOSIS — I4891 Unspecified atrial fibrillation: Secondary | ICD-10-CM | POA: Diagnosis not present

## 2018-05-05 DIAGNOSIS — I2510 Atherosclerotic heart disease of native coronary artery without angina pectoris: Secondary | ICD-10-CM | POA: Diagnosis not present

## 2018-05-05 DIAGNOSIS — E118 Type 2 diabetes mellitus with unspecified complications: Secondary | ICD-10-CM | POA: Diagnosis not present

## 2018-05-05 DIAGNOSIS — E1122 Type 2 diabetes mellitus with diabetic chronic kidney disease: Secondary | ICD-10-CM | POA: Diagnosis not present

## 2018-05-05 DIAGNOSIS — E782 Mixed hyperlipidemia: Secondary | ICD-10-CM | POA: Diagnosis not present

## 2018-05-05 DIAGNOSIS — I714 Abdominal aortic aneurysm, without rupture: Secondary | ICD-10-CM | POA: Diagnosis not present

## 2018-05-05 DIAGNOSIS — N184 Chronic kidney disease, stage 4 (severe): Secondary | ICD-10-CM | POA: Diagnosis not present

## 2018-05-05 DIAGNOSIS — E8770 Fluid overload, unspecified: Secondary | ICD-10-CM | POA: Diagnosis not present

## 2018-05-12 ENCOUNTER — Other Ambulatory Visit: Payer: Self-pay | Admitting: Nephrology

## 2018-05-12 DIAGNOSIS — N184 Chronic kidney disease, stage 4 (severe): Secondary | ICD-10-CM

## 2018-05-18 ENCOUNTER — Ambulatory Visit
Admission: RE | Admit: 2018-05-18 | Discharge: 2018-05-18 | Disposition: A | Discharge status: Home / Self Care | Payer: PPO | Source: Ambulatory Visit | Attending: Nephrology | Admitting: Nephrology

## 2018-05-18 DIAGNOSIS — I129 Hypertensive chronic kidney disease with stage 1 through stage 4 chronic kidney disease, or unspecified chronic kidney disease: Secondary | ICD-10-CM | POA: Diagnosis not present

## 2018-05-18 DIAGNOSIS — N184 Chronic kidney disease, stage 4 (severe): Secondary | ICD-10-CM | POA: Diagnosis not present

## 2018-05-25 ENCOUNTER — Other Ambulatory Visit: Payer: Self-pay | Admitting: Interventional Cardiology

## 2018-05-26 DIAGNOSIS — Z9181 History of falling: Secondary | ICD-10-CM | POA: Diagnosis not present

## 2018-06-03 DIAGNOSIS — R1031 Right lower quadrant pain: Secondary | ICD-10-CM | POA: Diagnosis not present

## 2018-06-03 DIAGNOSIS — M1610 Unilateral primary osteoarthritis, unspecified hip: Secondary | ICD-10-CM | POA: Diagnosis not present

## 2018-06-09 NOTE — Progress Notes (Signed)
Cardiology Office Note   Date:  06/10/2018   ID:  [name], DOB May 27, 1934, MRN [id number]  PCP:  Hulan Fess, MD  Cardiologist:  Dr. Tamala Julian    Chief Complaint  Patient presents with  . Coronary Artery Disease  . Atrial Fibrillation      History of Present Illness: [name] is a 84 y.o. male who presents for CAD and a fib.    He has a hx of CAD, AAA with endovascular anerurysm repair in 2010 followed by Dr, Scot Dock, HTN, CABG 2006.   and hx lower GI bleeding. Possibly of diverticulosis.   HLD. ASA to 81 mg daily last year.   Also hx memory deficit on aricept.     He is followed by Dr. Jimmy Footman for his CKD-4.  Today pt is without chest pain or irregular HR.  No SOB.  His diabetes control is up and down, most likely to diet.  He does have some swelling of Rt lower ext > Lt.  Dr. Jimmy Footman manages his lasix. Which is now lasix 160 mg daily.    Pt has had 1 fall and then hip and back pain followed by Dr. Rex Kras and Dr. Zadie Rhine.  He was placed on prn ultram.  This has helped.  Was started last week.   Today he has a rash on both arms macular papular and does blanch, no pruritis.  Just noticed today.    Past Medical History:  Diagnosis Date  . Coronary artery disease   . Diabetes mellitus without complication ([place])   . Diabetic neuropathy ([place]) 09/16/2017   Mild bilateral foot drops  . Gait abnormality 09/16/2017  . Hyperlipidemia   . Hypertension   . Memory difficulty 03/06/2017  . Toxic goiter     Past Surgical History:  Procedure Laterality Date  . ABDOMINAL AORTIC ANEURYSM REPAIR    . cardiac bypass    . CORONARY ARTERY BYPASS GRAFT       Current Outpatient Medications  Medication Sig Dispense Refill  . acetaminophen (TYLENOL) 650 MG CR tablet Take 325 mg by mouth every 8 (eight) hours as needed for pain. 1/2 tablet as needed     . aspirin EC 81 MG tablet Take 81 mg by mouth daily.    Marland Kitchen atorvastatin (LIPITOR) 40 MG tablet Take 40 mg by mouth daily.    Marland Kitchen  augmented betamethasone dipropionate (DIPROLENE-AF) 0.05 % ointment Apply 1 application topically as needed.    . calcitRIOL (ROCALTROL) 0.25 MCG capsule Take 0.5 mcg by mouth daily.    . Clotrimazole-Betamethasone (LOTRISONE EX) Apply 1 application topically 2 (two) times daily as needed (rash).    . donepezil (ARICEPT) 10 MG tablet Take 1 tablet (10 mg total) by mouth at bedtime. 90 tablet 3  . furosemide (LASIX) 80 MG tablet Take 160 mg by mouth daily on Sunday, Monday, Wednesday, and Friday. Take 80 mg by mouth daily on Tuesday, Thursday, and Saturday.    Marland Kitchen ketoconazole (NIZORAL) 2 % cream Apply 1 application topically daily.    Marland Kitchen LANTUS 100 UNIT/ML injection Inject 40 Units into the skin 2 (two) times daily.    Marland Kitchen levothyroxine (SYNTHROID, LEVOTHROID) 75 MCG tablet Take 75 mcg by mouth daily before breakfast.    . loratadine (CLARITIN) 10 MG tablet Take 10 mg by mouth daily.    . metoprolol succinate (TOPROL-XL) 25 MG 24 hr tablet Take 1 tablet (25 mg total) by mouth daily. Please keep upcoming appointment in July thank you.  90 tablet 0  . Multiple Vitamins-Minerals (MULTIVITAMIN ADULTS PO) Take 1 tablet by mouth daily.    . niacin (NIASPAN) 1000 MG CR tablet Take 1,000 mg by mouth 2 (two) times daily.    Marland Kitchen NOVOLOG 100 UNIT/ML injection Inject 25-30 Units into the skin 3 (three) times daily with meals.    . potassium chloride (K-DUR,KLOR-CON) 10 MEQ tablet Take 10 mEq by mouth 2 (two) times daily.    . sertraline (ZOLOFT) 50 MG tablet Take 50 mg by mouth daily.    . valACYclovir (VALTREX) 500 MG tablet Take 500 mg by mouth as needed.     No current facility-administered medications for this visit.     Allergies:   Patient has no known allergies.    Social History:  The patient  reports that he quit smoking about 22 years ago. He smoked 1.00 pack per day. He has never used smokeless tobacco. He reports that he does not drink alcohol or use drugs.   Family History:  The patient's family  history includes Diabetes in his brother, brother, brother, and sister; Heart attack in his brother, brother, father, and sister; Stroke in his father and mother.    ROS:  General:no colds or fevers, no weight changes Skin:+ rash no ulcers HEENT:no blurred vision, no congestion CV:see HPI PUL:see HPI GI:no diarrhea constipation or melena, no indigestion GU:no hematuria, no dysuria MS:no joint pain, no claudication Neuro:no syncope, no lightheadedness Endo:+ diabetes that is up and down., + thyroid disease  Wt Readings from Last 3 Encounters:  06/10/18 242 lb 12.8 oz (110.1 kg)  03/17/18 246 lb (111.6 kg)  09/16/17 239 lb (108.4 kg)     PHYSICAL EXAM: VS:  BP 138/72   Pulse (!) 56   Ht 5\' 10"  (1.778 m)   Wt 242 lb 12.8 oz (110.1 kg)   SpO2 92%   BMI 34.84 kg/m  , BMI Body mass index is 34.84 kg/m. General:Pleasant affect, NAD Skin:Warm and dry, brisk capillary refill, area on face with abrasion with electric razor, forehead with area that he has frozen with dermatology -both arms to short shirt sleeves with red rash macular papular that blanches.  None on trunk or legs.  No pruritis.  Denies being in sun. HEENT:normocephalic, sclera clear, mucus membranes moist Neck:supple, no JVD, no bruits  Heart:S1S2 RRR without murmur, gallup, rub or click Lungs:clear without rales, rhonchi, or wheezes GXQ:JJHER, soft, non tender, + BS, do not palpate liver spleen or masses Ext:2+ lower ext edema,Rt > Lt  Area of small ulcer on Rt lower ext, , 2+ radial pulses--walks with walker due to neuropathy. Neuro:alert and oriented X 3, MAE, follows commands, + facial symmetry- he and wife think his memory is stable.     EKG:  EKG is ordered today. The ekg ordered today demonstrates SB at 66 LAD and no changes from last year.   Recent Labs: No results found for requested labs within last 8760 hours.    Lipid Panel    Component Value Date/Time   CHOL 93 03/31/2016 0306   TRIG 102  03/31/2016 0306   HDL 35 (L) 03/31/2016 0306   CHOLHDL 2.7 03/31/2016 0306   VLDL 20 03/31/2016 0306   LDLCALC 38 03/31/2016 0306       Other studies Reviewed: Additional studies/ records that were reviewed today include: previous OV notes and CABG note .   ASSESSMENT AND PLAN:  1.  CAD with CABG 2006  Last cath 2006 no chest pain.  Stable - pt and his wife would like to be seen every 6 months since they are aging.  Will follow up with Dr. Tamala Julian in 6 months.   2.  HTN  Controlled continue current meds.  3.  AAA with repair in 2001 followed by Dr. Scot Dock STATUS POST ENDOVASCULAR REPAIR OF ABDOMINAL AORTIC ANEURYSM: the aneurysm continues to shrink in size and is now 3.2 cm in maximum diameter.  4.  DM-2 on insulin and diabetic neuropathy walks with a walker.   5.  CKD stage IV followed by Dr. Deteridng with edema will check with Dr. Archie Balboa if adding metolazone for a day or two will be ok.    6.   HLD goal LDL < 70 per Dr. Rex Kras, continue statin  7.   Memory deficits followed by Neuro.    8.   Hx a fib but maintaining SR. Reviewed symptoms of a fib with pt and wife.  9.   Back pain with no use of ultram and new rash,  Though rash only on arms and no pruritis.  I have asked him to follow up with Dr. Rex Kras.     Current medicines are reviewed with the patient today.  The patient Has no concerns regarding medicines.  The following changes have been made:  See above Labs/ tests ordered today include:see above  Disposition:   FU:  see above  Signed, Cecilie Kicks, NP  06/10/2018 10:34 AM    [address], [place] [place], [place] Phone: [phone]; Fax: [phone]

## 2018-06-10 ENCOUNTER — Ambulatory Visit (INDEPENDENT_AMBULATORY_CARE_PROVIDER_SITE_OTHER): Payer: PPO | Admitting: Cardiology

## 2018-06-10 ENCOUNTER — Encounter: Payer: Self-pay | Admitting: Cardiology

## 2018-06-10 VITALS — BP 138/72 | HR 56 | Ht 70.0 in | Wt 242.8 lb

## 2018-06-10 DIAGNOSIS — R21 Rash and other nonspecific skin eruption: Secondary | ICD-10-CM

## 2018-06-10 DIAGNOSIS — I714 Abdominal aortic aneurysm, without rupture, unspecified: Secondary | ICD-10-CM

## 2018-06-10 DIAGNOSIS — I10 Essential (primary) hypertension: Secondary | ICD-10-CM | POA: Diagnosis not present

## 2018-06-10 DIAGNOSIS — R413 Other amnesia: Secondary | ICD-10-CM | POA: Diagnosis not present

## 2018-06-10 DIAGNOSIS — Z794 Long term (current) use of insulin: Secondary | ICD-10-CM | POA: Diagnosis not present

## 2018-06-10 DIAGNOSIS — E0821 Diabetes mellitus due to underlying condition with diabetic nephropathy: Secondary | ICD-10-CM | POA: Diagnosis not present

## 2018-06-10 DIAGNOSIS — E785 Hyperlipidemia, unspecified: Secondary | ICD-10-CM

## 2018-06-10 DIAGNOSIS — N184 Chronic kidney disease, stage 4 (severe): Secondary | ICD-10-CM | POA: Diagnosis not present

## 2018-06-10 DIAGNOSIS — I2510 Atherosclerotic heart disease of native coronary artery without angina pectoris: Secondary | ICD-10-CM

## 2018-06-10 NOTE — Patient Instructions (Addendum)
Medication Instructions: Your physician recommends that you continue on your current medications as directed. Please refer to the Current Medication list given to you today.   Labwork: None Ordered  Procedures/Testing: None Ordered  Follow-Up: Your physician recommends that you schedule a follow-up appointment in: 6 months with Dr.Smith    Any Additional Special Instructions Will Be Listed Below (If Applicable).     If you need a refill on your cardiac medications before your next appointment, please call your pharmacy.

## 2018-06-24 ENCOUNTER — Ambulatory Visit: Payer: PPO | Admitting: Podiatry

## 2018-06-24 ENCOUNTER — Encounter: Payer: Self-pay | Admitting: Podiatry

## 2018-06-24 DIAGNOSIS — E1159 Type 2 diabetes mellitus with other circulatory complications: Secondary | ICD-10-CM

## 2018-06-24 DIAGNOSIS — M79674 Pain in right toe(s): Secondary | ICD-10-CM

## 2018-06-24 DIAGNOSIS — E1142 Type 2 diabetes mellitus with diabetic polyneuropathy: Secondary | ICD-10-CM | POA: Diagnosis not present

## 2018-06-24 DIAGNOSIS — M79675 Pain in left toe(s): Secondary | ICD-10-CM | POA: Diagnosis not present

## 2018-06-24 DIAGNOSIS — B351 Tinea unguium: Secondary | ICD-10-CM

## 2018-06-24 NOTE — Progress Notes (Signed)
Complaint:  Visit Type: Patient returns to my office for continued preventative foot care services. Complaint: Patient states" my nails have grown long and thick and become painful to walk and wear shoes" Patient has been diagnosed with DM with no foot complications. The patient presents for preventative foot care services. No changes to ROS  Podiatric Exam: Vascular: dorsalis pedis and posterior tibial pulses are not  palpable bilateral. Capillary return is immediate. Temperature gradient is WNL. Skin turgor WNL  Sensorium: Diminished  Semmes Weinstein monofilament test. Normal tactile sensation bilaterally. Nail Exam: Pt has thick disfigured discolored nails with subungual debris noted bilateral entire nail hallux through fifth toenails Ulcer Exam: There is no evidence of ulcer or pre-ulcerative changes or infection. Orthopedic Exam: Muscle tone and strength are WNL. No limitations in general ROM. No crepitus or effusions noted. Foot type and digits show no abnormalities. Bony prominences are unremarkable. Skin: No Porokeratosis. No infection or ulcers.  Asymptomatic skin lesion left heel.  Diagnosis:  Onychomycosis, , Pain in right toe, pain in left toes,    Treatment & Plan Procedures and Treatment: Consent by patient was obtained for treatment procedures. The patient understood the discussion of treatment and procedures well. All questions were answered thoroughly reviewed. Debridement of mycotic and hypertrophic toenails, 1 through 5 bilateral and clearing of subungual debris. No ulceration, no infection noted.  Return Visit-Office Procedure: Patient instructed to return to the office for a follow up visit 3 months for continued evaluation and treatment.    [name]

## 2018-06-25 DIAGNOSIS — Z8639 Personal history of other endocrine, nutritional and metabolic disease: Secondary | ICD-10-CM | POA: Diagnosis not present

## 2018-06-25 DIAGNOSIS — I10 Essential (primary) hypertension: Secondary | ICD-10-CM | POA: Diagnosis not present

## 2018-06-25 DIAGNOSIS — Z9889 Other specified postprocedural states: Secondary | ICD-10-CM | POA: Diagnosis not present

## 2018-06-25 DIAGNOSIS — Z1389 Encounter for screening for other disorder: Secondary | ICD-10-CM | POA: Diagnosis not present

## 2018-06-25 DIAGNOSIS — N189 Chronic kidney disease, unspecified: Secondary | ICD-10-CM | POA: Diagnosis not present

## 2018-06-25 DIAGNOSIS — H6123 Impacted cerumen, bilateral: Secondary | ICD-10-CM | POA: Diagnosis not present

## 2018-06-25 DIAGNOSIS — R609 Edema, unspecified: Secondary | ICD-10-CM | POA: Diagnosis not present

## 2018-06-25 DIAGNOSIS — E1122 Type 2 diabetes mellitus with diabetic chronic kidney disease: Secondary | ICD-10-CM | POA: Diagnosis not present

## 2018-06-25 DIAGNOSIS — Z0000 Encounter for general adult medical examination without abnormal findings: Secondary | ICD-10-CM | POA: Diagnosis not present

## 2018-06-25 DIAGNOSIS — E782 Mixed hyperlipidemia: Secondary | ICD-10-CM | POA: Diagnosis not present

## 2018-06-25 DIAGNOSIS — I4891 Unspecified atrial fibrillation: Secondary | ICD-10-CM | POA: Diagnosis not present

## 2018-06-25 DIAGNOSIS — D696 Thrombocytopenia, unspecified: Secondary | ICD-10-CM | POA: Diagnosis not present

## 2018-06-25 DIAGNOSIS — Z136 Encounter for screening for cardiovascular disorders: Secondary | ICD-10-CM | POA: Diagnosis not present

## 2018-06-25 DIAGNOSIS — I25119 Atherosclerotic heart disease of native coronary artery with unspecified angina pectoris: Secondary | ICD-10-CM | POA: Diagnosis not present

## 2018-07-08 DIAGNOSIS — N184 Chronic kidney disease, stage 4 (severe): Secondary | ICD-10-CM | POA: Diagnosis not present

## 2018-07-08 DIAGNOSIS — D631 Anemia in chronic kidney disease: Secondary | ICD-10-CM | POA: Diagnosis not present

## 2018-07-08 DIAGNOSIS — E8770 Fluid overload, unspecified: Secondary | ICD-10-CM | POA: Diagnosis not present

## 2018-07-08 DIAGNOSIS — I129 Hypertensive chronic kidney disease with stage 1 through stage 4 chronic kidney disease, or unspecified chronic kidney disease: Secondary | ICD-10-CM | POA: Diagnosis not present

## 2018-07-08 DIAGNOSIS — F0390 Unspecified dementia without behavioral disturbance: Secondary | ICD-10-CM | POA: Diagnosis not present

## 2018-07-08 DIAGNOSIS — N2581 Secondary hyperparathyroidism of renal origin: Secondary | ICD-10-CM | POA: Diagnosis not present

## 2018-07-08 DIAGNOSIS — E1122 Type 2 diabetes mellitus with diabetic chronic kidney disease: Secondary | ICD-10-CM | POA: Diagnosis not present

## 2018-07-21 DIAGNOSIS — M545 Low back pain: Secondary | ICD-10-CM | POA: Diagnosis not present

## 2018-07-21 DIAGNOSIS — M25551 Pain in right hip: Secondary | ICD-10-CM | POA: Diagnosis not present

## 2018-07-22 ENCOUNTER — Encounter (HOSPITAL_COMMUNITY): Payer: Self-pay | Admitting: Emergency Medicine

## 2018-07-22 ENCOUNTER — Emergency Department (HOSPITAL_COMMUNITY)
Admission: EM | Admit: 2018-07-22 | Discharge: 2018-07-22 | Disposition: A | Discharge status: Home / Self Care | Payer: PPO | Attending: Emergency Medicine | Admitting: Emergency Medicine

## 2018-07-22 DIAGNOSIS — Y999 Unspecified external cause status: Secondary | ICD-10-CM | POA: Diagnosis not present

## 2018-07-22 DIAGNOSIS — N184 Chronic kidney disease, stage 4 (severe): Secondary | ICD-10-CM | POA: Diagnosis not present

## 2018-07-22 DIAGNOSIS — Z794 Long term (current) use of insulin: Secondary | ICD-10-CM | POA: Insufficient documentation

## 2018-07-22 DIAGNOSIS — Y939 Activity, unspecified: Secondary | ICD-10-CM | POA: Diagnosis not present

## 2018-07-22 DIAGNOSIS — Y929 Unspecified place or not applicable: Secondary | ICD-10-CM | POA: Insufficient documentation

## 2018-07-22 DIAGNOSIS — Z87891 Personal history of nicotine dependence: Secondary | ICD-10-CM | POA: Diagnosis not present

## 2018-07-22 DIAGNOSIS — R58 Hemorrhage, not elsewhere classified: Secondary | ICD-10-CM | POA: Diagnosis not present

## 2018-07-22 DIAGNOSIS — Z79899 Other long term (current) drug therapy: Secondary | ICD-10-CM | POA: Diagnosis not present

## 2018-07-22 DIAGNOSIS — I129 Hypertensive chronic kidney disease with stage 1 through stage 4 chronic kidney disease, or unspecified chronic kidney disease: Secondary | ICD-10-CM | POA: Diagnosis not present

## 2018-07-22 DIAGNOSIS — S6992XA Unspecified injury of left wrist, hand and finger(s), initial encounter: Secondary | ICD-10-CM | POA: Diagnosis present

## 2018-07-22 DIAGNOSIS — Z7982 Long term (current) use of aspirin: Secondary | ICD-10-CM | POA: Diagnosis not present

## 2018-07-22 DIAGNOSIS — W272XXA Contact with scissors, initial encounter: Secondary | ICD-10-CM | POA: Diagnosis not present

## 2018-07-22 DIAGNOSIS — I2510 Atherosclerotic heart disease of native coronary artery without angina pectoris: Secondary | ICD-10-CM | POA: Diagnosis not present

## 2018-07-22 DIAGNOSIS — I10 Essential (primary) hypertension: Secondary | ICD-10-CM | POA: Diagnosis not present

## 2018-07-22 DIAGNOSIS — Z951 Presence of aortocoronary bypass graft: Secondary | ICD-10-CM | POA: Diagnosis not present

## 2018-07-22 DIAGNOSIS — T07XXXA Unspecified multiple injuries, initial encounter: Secondary | ICD-10-CM | POA: Diagnosis not present

## 2018-07-22 DIAGNOSIS — E1140 Type 2 diabetes mellitus with diabetic neuropathy, unspecified: Secondary | ICD-10-CM | POA: Insufficient documentation

## 2018-07-22 DIAGNOSIS — S61012A Laceration without foreign body of left thumb without damage to nail, initial encounter: Secondary | ICD-10-CM | POA: Diagnosis not present

## 2018-07-22 LAB — CBC WITH DIFFERENTIAL/PLATELET
Abs Immature Granulocytes: 0.1 10*3/uL (ref 0.0–0.1)
Basophils Absolute: 0 10*3/uL (ref 0.0–0.1)
Basophils Relative: 0 %
Eosinophils Absolute: 0.2 10*3/uL (ref 0.0–0.7)
Eosinophils Relative: 2 %
HCT: 47.6 % (ref 39.0–52.0)
Hemoglobin: 15.3 g/dL (ref 13.0–17.0)
Immature Granulocytes: 1 %
Lymphocytes Relative: 9 %
Lymphs Abs: 1.1 10*3/uL (ref 0.7–4.0)
MCH: 31 pg (ref 26.0–34.0)
MCHC: 32.1 g/dL (ref 30.0–36.0)
MCV: 96.4 fL (ref 78.0–100.0)
Monocytes Absolute: 0.7 10*3/uL (ref 0.1–1.0)
Monocytes Relative: 6 %
Neutro Abs: 9.4 10*3/uL — ABNORMAL HIGH (ref 1.7–7.7)
Neutrophils Relative %: 82 %
Platelets: 120 10*3/uL — ABNORMAL LOW (ref 150–400)
RBC: 4.94 MIL/uL (ref 4.22–5.81)
RDW: 14.1 % (ref 11.5–15.5)
WBC: 11.6 10*3/uL — ABNORMAL HIGH (ref 4.0–10.5)

## 2018-07-22 LAB — BASIC METABOLIC PANEL
Anion gap: 12 (ref 5–15)
BUN: 28 mg/dL — ABNORMAL HIGH (ref 8–23)
CO2: 24 mmol/L (ref 22–32)
Calcium: 9.4 mg/dL (ref 8.9–10.3)
Chloride: 104 mmol/L (ref 98–111)
Creatinine, Ser: 2.18 mg/dL — ABNORMAL HIGH (ref 0.61–1.24)
GFR calc Af Amer: 31 mL/min — ABNORMAL LOW (ref >60)
GFR calc non Af Amer: 26 mL/min — ABNORMAL LOW (ref >60)
Glucose, Bld: 132 mg/dL — ABNORMAL HIGH (ref 70–99)
Potassium: 3.6 mmol/L (ref 3.5–5.1)
Sodium: 140 mmol/L (ref 135–145)

## 2018-07-22 MED ORDER — LIDOCAINE-EPINEPHRINE (PF) 2 %-1:200000 IJ SOLN
INTRAMUSCULAR | Status: AC
  Administered 2018-07-22: 04:00:00
  Filled 2018-07-22: qty 20

## 2018-07-22 NOTE — ED Notes (Signed)
Provider bedside.

## 2018-07-22 NOTE — ED Provider Notes (Signed)
[facility] EMERGENCY DEPARTMENT Provider Note   CSN: [id number] Arrival date & time: 07/22/18  0345     History   Chief Complaint Chief Complaint  Patient presents with  . Extremity Laceration    HPI [name] is a 84 y.o. male.  Patient is a 84 year old male with history of coronary artery disease, diabetes, hypertension.  He is brought by EMS for evaluation of a hand laceration.  He was attempting to open a can with a pair of scissors when his hand slipped and he stabbed his left hand.  He has a small laceration to the base of the thumb with significant arterial bleeding.  EMS applied a tourniquet prior to transport.  Patient denies any other injury or trauma.  There are no aggravating or alleviating factors.  The history is provided by the patient.    Past Medical History:  Diagnosis Date  . Coronary artery disease   . Diabetes mellitus without complication ([place])   . Diabetic neuropathy ([place]) 09/16/2017   Mild bilateral foot drops  . Gait abnormality 09/16/2017  . Hyperlipidemia   . Hypertension   . Memory difficulty 03/06/2017  . Toxic goiter     Patient Active Problem List   Diagnosis Date Noted  . Diabetic neuropathy ([place]) 09/16/2017  . Gait abnormality 09/16/2017  . Memory difficulty 03/06/2017  . GIB (gastrointestinal bleeding) 03/31/2016  . Rectal bleeding   . Chronic kidney disease, stage IV (severe) ([place]) 03/23/2015  . CAD (coronary artery disease) of artery bypass graft 03/22/2014  . Essential hypertension 03/22/2014  . Hyperlipidemia 03/22/2014  . Multinodular goiter 03/22/2014  . Diabetes mellitus due to underlying condition with diabetic nephropathy ([place]) 03/22/2014  . Abdominal aortic aneurysm ([place]) 03/04/2012    Past Surgical History:  Procedure Laterality Date  . ABDOMINAL AORTIC ANEURYSM REPAIR    . cardiac bypass    . CORONARY ARTERY BYPASS GRAFT          Home Medications    Prior to Admission medications     Medication Sig Start Date End Date Taking? Authorizing Provider  acetaminophen (TYLENOL) 325 MG tablet Take 650 mg by mouth every 6 (six) hours as needed for mild pain, fever or headache.   Yes [provider]  aspirin EC 81 MG tablet Take 81 mg by mouth daily.   Yes [provider]  atorvastatin (LIPITOR) 40 MG tablet Take 40 mg by mouth every evening.    Yes [provider]  calcitRIOL (ROCALTROL) 0.25 MCG capsule Take 0.5 mcg by mouth daily. 04/29/17  Yes [provider]  donepezil (ARICEPT) 10 MG tablet Take 1 tablet (10 mg total) by mouth at bedtime. 09/16/17  Yes Kathrynn Ducking, MD  furosemide (LASIX) 80 MG tablet Take 160 mg by mouth daily.  05/18/17  Yes [provider]  LANTUS 100 UNIT/ML injection Inject 40 Units into the skin 2 (two) times daily. 03/14/17  Yes [provider]  levothyroxine (SYNTHROID, LEVOTHROID) 75 MCG tablet Take 75 mcg by mouth daily before breakfast.   Yes [provider]  loratadine (CLARITIN) 10 MG tablet Take 10 mg by mouth daily as needed for allergies.    Yes [provider]  metoprolol succinate (TOPROL-XL) 25 MG 24 hr tablet Take 1 tablet (25 mg total) by mouth daily. Please keep upcoming appointment in July thank you. 05/26/18  Yes Belva Crome, MD  Multiple Vitamins-Minerals (MULTIVITAMIN ADULTS PO) Take 1 tablet by mouth daily.   Yes  [provider]  niacin (NIASPAN) 1000 MG CR tablet Take 1,000 mg by mouth 2 (two) times daily.   Yes [provider]  NOVOLOG 100 UNIT/ML injection Inject 25-30 Units into the skin 3 (three) times daily with meals. 03/18/17  Yes [provider]  potassium chloride (K-DUR,KLOR-CON) 10 MEQ tablet Take 10 mEq by mouth 2 (two) times daily.   Yes [provider]  sertraline (ZOLOFT) 50 MG tablet Take 50 mg by mouth daily.   Yes [provider]  acetaminophen (TYLENOL) 650 MG CR tablet Take 325 mg by mouth every 8  (eight) hours as needed for pain. 1/2 tablet as needed     [provider]    Family History Family History  Problem Relation Age of Onset  . Stroke Mother   . Stroke Father   . Heart attack Father   . Diabetes Sister   . Heart attack Sister   . Diabetes Brother   . Heart attack Brother   . Diabetes Brother   . Diabetes Brother   . Heart attack Brother     Social History Social History   Tobacco Use  . Smoking status: Former Smoker    Packs/day: 1.00    Last attempt to quit: 03/31/1996    Years since quitting: 22.3  . Smokeless tobacco: Never Used  Substance Use Topics  . Alcohol use: No  . Drug use: No     Allergies   Patient has no known allergies.   Review of Systems Review of Systems  All other systems reviewed and are negative.    Physical Exam Updated Vital Signs BP 112/82   Pulse (!) 54   Temp 98.5 F (36.9 C)   Resp 18   SpO2 95%   Physical Exam  Constitutional: He is oriented to person, place, and time. He appears well-developed and well-nourished. No distress.  HENT:  Head: Normocephalic and atraumatic.  Neck: Normal range of motion. Neck supple.  Pulmonary/Chest: Effort normal.  Musculoskeletal:  There is a less than 1 cm laceration at the base of the left thumb.  There is apparent arterial bleeding present.  Distal cap refill to the thumb is intact.  Neurological: He is alert and oriented to person, place, and time.  Skin: Skin is warm and dry. He is not diaphoretic.  Nursing note and vitals reviewed.    ED Treatments / Results  Labs (all labs ordered are listed, but only abnormal results are displayed) Labs Reviewed  CBC WITH DIFFERENTIAL/PLATELET - Abnormal; Notable for the following components:      Result Value   WBC 11.6 (*)    Platelets 120 (*)    Neutro Abs 9.4 (*)    All other components within normal limits  BASIC METABOLIC PANEL    EKG None  Radiology No results found.  Procedures Procedures (including  critical care time)  Medications Ordered in ED Medications  lidocaine-EPINEPHrine (XYLOCAINE W/EPI) 2 %-1:200000 (PF) injection (  Given 07/22/18 0409)     Initial Impression / Assessment and Plan / ED Course  I have reviewed the triage vital signs and the nursing notes.  Pertinent labs & imaging results that were available during my care of the patient were reviewed by me and considered in my medical decision making (see chart for details).  Patient with a laceration to the base of the left thumb sustained with a pair of scissors.  There was serial bleeding and EMS applied a tourniquet.  The laceration was  repaired with good hemostasis.  The tourniquet was let down and bleeding was under control.  Hemoglobin is normal at 15.3 and he appears appropriate for discharge.  LACERATION REPAIR Performed by: Veryl Speak Authorized by: Veryl Speak Consent: Verbal consent obtained. Risks and benefits: risks, benefits and alternatives were discussed Consent given by: patient Patient identity confirmed: provided demographic data Prepped and Draped in normal sterile fashion Wound explored  Laceration Location: hand  Laceration Length: 1cm  No Foreign Bodies seen or palpated  Anesthesia: local infiltration  Local anesthetic: lidocaine 1% with epinephrine  Anesthetic total: 2 ml  Irrigation method: syringe Amount of cleaning: standard  Skin closure: 4-0 ethilon  Number of sutures: 2  Technique: simple interrupted  Patient tolerance: Patient tolerated the procedure well with no immediate complications.   Final Clinical Impressions(s) / ED Diagnoses   Final diagnoses:  None    ED Discharge Orders    None       Veryl Speak, MD 07/22/18 [phone]

## 2018-07-22 NOTE — ED Triage Notes (Signed)
Pt cut his left and w/ scissors as he was trying to open a container.  Per EMS laceration is "spirting."  Placed tourniquet on Left arm.

## 2018-07-22 NOTE — Discharge Instructions (Addendum)
Local wound care with bacitracin and dressing changes twice daily.  Sutures are to be removed in 1 week.  Please follow-up with your primary doctor for this.  Return to the ER if you develop increased pain, recurrent bleeding not controlled with direct pressure, redness or pus coming from the wound, or other new and concerning symptoms.

## 2018-07-30 DIAGNOSIS — Z6835 Body mass index (BMI) 35.0-35.9, adult: Secondary | ICD-10-CM | POA: Diagnosis not present

## 2018-07-30 DIAGNOSIS — S61412A Laceration without foreign body of left hand, initial encounter: Secondary | ICD-10-CM | POA: Diagnosis not present

## 2018-08-03 ENCOUNTER — Other Ambulatory Visit: Payer: Self-pay | Admitting: *Deleted

## 2018-08-03 DIAGNOSIS — H2513 Age-related nuclear cataract, bilateral: Secondary | ICD-10-CM | POA: Diagnosis not present

## 2018-08-03 DIAGNOSIS — E119 Type 2 diabetes mellitus without complications: Secondary | ICD-10-CM | POA: Diagnosis not present

## 2018-08-03 NOTE — Patient Outreach (Signed)
[facility]) Care Management  08/03/2018  [name] 04/21/1934 [id number]   Telephone Screen  Referral Date: 07/31/18 Referral Source: [facility]  Referral Reason: assist with locating local/community activities for member Patient has dementia Permission obtained form HTA staff to speak with the wife patricia Insurance: [employer] team advantage (HTA)  Outreach attempt # 1  No answer. [facility] RN CM left HIPAA compliant voicemail message along with CM's contact info.  Plan: [facility] RN CM sent an unsuccessful outreach letter and scheduled this patient for another call attempt within 4 business days   Echo Allsbrook L. Lavina Hamman, RN, BSN, [place] Management Care Coordinator Direct Number [phone] Mobile number [phone]  Main [facility] number [phone] Fax number [phone]

## 2018-08-04 ENCOUNTER — Other Ambulatory Visit: Payer: Self-pay | Admitting: *Deleted

## 2018-08-04 NOTE — Patient Outreach (Signed)
[facility]) Care Management  08/04/2018  TYKEL BADIE 01-04-1934 [id number]   Telephone Screen  Referral Date: 07/31/18 Referral Source: [facility] UM  Referral Reason: assist with locating local/community activities for member Patient has dementia Permission obtained from HTA staff to speak with the wife patricia Insurance: [employer] team advantage (HTA)  Outreach attempt # 2  No answer. [facility] RN CM left HIPAA compliant voicemail message along with CM's contact info at the only number listed in Epic for Mr & Mrs Flatt as both their home and mobile numbers    Plan: [facility] RN CM scheduled this patient for another call attempt within 4 business days   Neita Landrigan L. Lavina Hamman, RN, BSN, [place] Management Care Coordinator Direct Number [phone] Mobile number [phone]  Main [facility] number [phone] Fax number [phone]

## 2018-08-11 ENCOUNTER — Other Ambulatory Visit: Payer: Self-pay | Admitting: *Deleted

## 2018-08-11 NOTE — Patient Outreach (Signed)
[facility] Care Management  08/11/2018  [name] 1934/05/10 [id number]   Telephone Screen  Referral Date:07/31/18 Referral Source:[facility]A UM Referral Reason:assist with locating local/community activities for member Patient has dementia Permission obtained from HTA staff to speak with the wife patricia Insurance: Health team advantage (HTA)  Outreach attempt #3  No answer. [facility] RN CM left HIPAA compliant voicemail message along with CM's contact info at the only number listed in Epic for Mr & Mrs Saunders as both their home and mobile numbers    Plan: [facility] RN CM scheduled this patient for case closure if no return call or response per call attempt workflow   [place]. Lavina Hamman, RN, BSN, [place] Management Care Coordinator Direct Number [phone] Mobile number [phone]  Main [facility] number [phone] Fax number [phone]

## 2018-08-18 ENCOUNTER — Other Ambulatory Visit: Payer: Self-pay | Admitting: *Deleted

## 2018-08-18 NOTE — Patient Outreach (Signed)
[facility]) Care Management  08/18/2018  [name] 1934-11-27 [id number]   Case closure   Call attempts made on 08/03/18, 08/04/18 and 08/11/18 Unsuccessful outreach letter sent on 08/03/18 without a response   Plan [facility] RN CM will close case after no response from patient within 10 business days. Unable to reach Case closure letters sent to patient and MD  Joelene Millin L. Lavina Hamman, RN, BSN, [place] Coordinator Office number [phone] Mobile number [phone]  Main [facility] number [phone] Fax number [phone]

## 2018-09-04 ENCOUNTER — Other Ambulatory Visit: Payer: Self-pay | Admitting: Interventional Cardiology

## 2018-09-07 DIAGNOSIS — E782 Mixed hyperlipidemia: Secondary | ICD-10-CM | POA: Diagnosis not present

## 2018-09-07 DIAGNOSIS — E8770 Fluid overload, unspecified: Secondary | ICD-10-CM | POA: Diagnosis not present

## 2018-09-07 DIAGNOSIS — N2581 Secondary hyperparathyroidism of renal origin: Secondary | ICD-10-CM | POA: Diagnosis not present

## 2018-09-07 DIAGNOSIS — N189 Chronic kidney disease, unspecified: Secondary | ICD-10-CM | POA: Diagnosis not present

## 2018-09-07 DIAGNOSIS — N184 Chronic kidney disease, stage 4 (severe): Secondary | ICD-10-CM | POA: Diagnosis not present

## 2018-09-07 DIAGNOSIS — Z8679 Personal history of other diseases of the circulatory system: Secondary | ICD-10-CM | POA: Diagnosis not present

## 2018-09-07 DIAGNOSIS — I2510 Atherosclerotic heart disease of native coronary artery without angina pectoris: Secondary | ICD-10-CM | POA: Diagnosis not present

## 2018-09-07 DIAGNOSIS — D631 Anemia in chronic kidney disease: Secondary | ICD-10-CM | POA: Diagnosis not present

## 2018-09-07 DIAGNOSIS — I4891 Unspecified atrial fibrillation: Secondary | ICD-10-CM | POA: Diagnosis not present

## 2018-09-07 DIAGNOSIS — I129 Hypertensive chronic kidney disease with stage 1 through stage 4 chronic kidney disease, or unspecified chronic kidney disease: Secondary | ICD-10-CM | POA: Diagnosis not present

## 2018-09-07 DIAGNOSIS — E1122 Type 2 diabetes mellitus with diabetic chronic kidney disease: Secondary | ICD-10-CM | POA: Diagnosis not present

## 2018-09-07 DIAGNOSIS — I714 Abdominal aortic aneurysm, without rupture: Secondary | ICD-10-CM | POA: Diagnosis not present

## 2018-09-21 DIAGNOSIS — E890 Postprocedural hypothyroidism: Secondary | ICD-10-CM | POA: Diagnosis not present

## 2018-09-21 DIAGNOSIS — I10 Essential (primary) hypertension: Secondary | ICD-10-CM | POA: Diagnosis not present

## 2018-09-21 DIAGNOSIS — E1165 Type 2 diabetes mellitus with hyperglycemia: Secondary | ICD-10-CM | POA: Diagnosis not present

## 2018-09-21 DIAGNOSIS — E7800 Pure hypercholesterolemia, unspecified: Secondary | ICD-10-CM | POA: Diagnosis not present

## 2018-09-22 ENCOUNTER — Encounter: Payer: Self-pay | Admitting: Adult Health

## 2018-09-22 ENCOUNTER — Ambulatory Visit (INDEPENDENT_AMBULATORY_CARE_PROVIDER_SITE_OTHER): Payer: PPO | Admitting: Adult Health

## 2018-09-22 VITALS — BP 128/61 | HR 60 | Ht 70.0 in | Wt 241.0 lb

## 2018-09-22 DIAGNOSIS — R413 Other amnesia: Secondary | ICD-10-CM | POA: Diagnosis not present

## 2018-09-22 NOTE — Progress Notes (Signed)
I have read the note, and I agree with the clinical assessment and plan.  Aiyden Lauderback K Sharyah Bostwick   

## 2018-09-22 NOTE — Patient Instructions (Signed)
Your Plan:  Continue aricept 10 mg daily  Memory score is stable If your symptoms worsen or you develop new symptoms please let us know.   Thank you for coming to see [place] at [facility]. I hope we have been able to provide you high quality care today.  You may receive a patient satisfaction survey over the next few weeks. We would appreciate your feedback and comments so that we may continue to improve ourselves and the health of our patients.

## 2018-09-22 NOTE — Progress Notes (Signed)
PATIENT: [name] DOB: November 14, 1934  REASON FOR VISIT: follow up HISTORY FROM: patient  HISTORY OF PRESENT ILLNESS: Today 09/22/18:  [name] is an 84 year old male with a history of memory disturbance.  He returns today for follow-up.  He continues to live at home with his wife.  He feels that his memory has remained stable.  He does require some with ADLs.  Denies any trouble sleeping.  Reports good appetite.  He does not operate a motor vehicle.  His wife manages the finances, his medications and appointments.  He denies any change in his mood or behavior.  He continues on Aricept 10 mg at bedtime.  He continues to have "spacing out events" when his blood sugar increases.  He reports that his last hemoglobin A1c was 7.5%.  He returns today for an evaluation.  HISTORY 03/17/18: Patient returns for six-month follow-up and is accompanied by his wife.  He continues to take Aricept 10 mg without side effects and without worsening in memory.  Wife is concerned for recent "spacing out" episodes where he will be doing something and then does walk away and states that he does not remember this happening.  He is not lose consciousness during these events.  Wife believes that these events are related sugar levels as they do fluctuate from hypo-to hyper glycemia due to the food that he eats.  He is followed with endocrinologist for diabetic management.  Recommended to try to correlate BG levels with these events to see if there is a correlation.  Blood pressure mildly elevated at today's appointment 145/62 but this is checked at home and typically SBP 120s.  Patient doing well overall and no further complaints or questions during this visit.  REVIEW OF SYSTEMS: Out of a complete 14 system review of symptoms, the patient complains only of the following symptoms, and all other reviewed systems are negative.  Fatigue, cough, runny nose, leg swelling, diarrhea, walking difficulty, confusion, decreased  concentration, memory loss, bruise/bleed easily  ALLERGIES: No Known Allergies  HOME MEDICATIONS: Outpatient Medications Prior to Visit  Medication Sig Dispense Refill  . acetaminophen (TYLENOL) 325 MG tablet Take 650 mg by mouth every 6 (six) hours as needed for mild pain, fever or headache.    Marland Kitchen acetaminophen (TYLENOL) 650 MG CR tablet Take 325 mg by mouth every 8 (eight) hours as needed for pain. 1/2 tablet as needed     . aspirin EC 81 MG tablet Take 81 mg by mouth daily.    Marland Kitchen atorvastatin (LIPITOR) 40 MG tablet Take 40 mg by mouth every evening.     . calcitRIOL (ROCALTROL) 0.25 MCG capsule Take 0.5 mcg by mouth daily.    Marland Kitchen donepezil (ARICEPT) 10 MG tablet Take 1 tablet (10 mg total) by mouth at bedtime. 90 tablet 3  . furosemide (LASIX) 80 MG tablet Take 160 mg by mouth daily.     Marland Kitchen LANTUS 100 UNIT/ML injection Inject 40 Units into the skin 2 (two) times daily.    Marland Kitchen levothyroxine (SYNTHROID, LEVOTHROID) 75 MCG tablet Take 75 mcg by mouth daily before breakfast.    . loratadine (CLARITIN) 10 MG tablet Take 10 mg by mouth daily as needed for allergies.     . metoprolol succinate (TOPROL-XL) 25 MG 24 hr tablet Take 1 tablet (25 mg total) by mouth daily. 90 tablet 2  . Multiple Vitamins-Minerals (MULTIVITAMIN ADULTS PO) Take 1 tablet by mouth daily.    . niacin (NIASPAN) 1000 MG CR tablet  Take 1,000 mg by mouth 2 (two) times daily.    Marland Kitchen NOVOLOG 100 UNIT/ML injection Inject 25-30 Units into the skin 3 (three) times daily with meals.    . potassium chloride (K-DUR,KLOR-CON) 10 MEQ tablet Take 10 mEq by mouth 2 (two) times daily.    . sertraline (ZOLOFT) 50 MG tablet Take 50 mg by mouth daily.     No facility-administered medications prior to visit.     PAST MEDICAL HISTORY: Past Medical History:  Diagnosis Date  . Coronary artery disease   . Diabetes mellitus without complication ([place])   . Diabetic neuropathy ([place]) 09/16/2017   Mild bilateral foot drops  . Gait abnormality 09/16/2017   . Hyperlipidemia   . Hypertension   . Memory difficulty 03/06/2017  . Toxic goiter     PAST SURGICAL HISTORY: Past Surgical History:  Procedure Laterality Date  . ABDOMINAL AORTIC ANEURYSM REPAIR    . cardiac bypass    . CORONARY ARTERY BYPASS GRAFT      FAMILY HISTORY: Family History  Problem Relation Age of Onset  . Stroke Mother   . Stroke Father   . Heart attack Father   . Diabetes Sister   . Heart attack Sister   . Diabetes Brother   . Heart attack Brother   . Diabetes Brother   . Diabetes Brother   . Heart attack Brother     SOCIAL HISTORY: Social History   Socioeconomic History  . Marital status: Married    Spouse name: [name]  . Number of children: 3  . Years of education: 1  . Highest education level: Not on file  Occupational History  . Not on file  Social Needs  . Financial resource strain: Not on file  . Food insecurity:    Worry: Not on file    Inability: Not on file  . Transportation needs:    Medical: Not on file    Non-medical: Not on file  Tobacco Use  . Smoking status: Former Smoker    Packs/day: 1.00    Last attempt to quit: 03/31/1996    Years since quitting: 22.4  . Smokeless tobacco: Never Used  Substance and Sexual Activity  . Alcohol use: No  . Drug use: No  . Sexual activity: Yes    Birth control/protection: None  Lifestyle  . Physical activity:    Days per week: Not on file    Minutes per session: Not on file  . Stress: Not on file  Relationships  . Social connections:    Talks on phone: Not on file    Gets together: Not on file    Attends religious service: Not on file    Active member of club or organization: Not on file    Attends meetings of clubs or organizations: Not on file    Relationship status: Not on file  . Intimate partner violence:    Fear of current or ex partner: Not on file    Emotionally abused: Not on file    Physically abused: Not on file    Forced sexual activity: Not on file  Other Topics  Concern  . Not on file  Social History Narrative   Lives  With spouse   Caffeine use:  Coffee ocass   Tea/soda daily   Right-handed      PHYSICAL EXAM  Vitals:   09/22/18 1350  BP: 128/61  Pulse: 60  Weight: 241 lb (109.3 kg)  Height: 5\' 10"  (1.778 m)   Body  mass index is 34.58 kg/m.   MMSE - Mini Mental State Exam 09/22/2018 03/17/2018 03/06/2017  Not completed: (No Data) - -  Orientation to time 5 5 5   Orientation to Place 4 5 5   Registration 3 3 3   Attention/ Calculation 4 5 5   Recall 3 2 3   Language- name 2 objects 2 2 2   Language- repeat 1 1 1   Language- follow 3 step command 3 3 3   Language- read & follow direction 1 1 1   Write a sentence 1 1 1   Copy design 0 1 1  Total score 27 29 30      Generalized: Well developed, in no acute distress   Neurological examination  Mentation: Alert. Follows all commands speech and language fluent Cranial nerve II-XII:  Extraocular movements were full, visual field were full on confrontational test. Facial sensation and strength were normal. Uvula tongue midline. Head turning and shoulder shrug  were normal and symmetric. Motor: The motor testing reveals 5 over 5 strength of all 4 extremities. Good symmetric motor tone is noted throughout.  Sensory: Sensory testing is intact to soft touch on all 4 extremities. No evidence of extinction is noted.  Coordination: Cerebellar testing reveals good finger-nose-finger and heel-to-shin bilaterally.  Gait and station: Patient's gait is unsteady.  He uses a cane when ambulating. Marland Kitchen   DIAGNOSTIC DATA (LABS, IMAGING, TESTING) - I reviewed patient records, labs, notes, testing and imaging myself where available.  Lab Results  Component Value Date   WBC 11.6 (H) 07/22/2018   HGB 15.3 07/22/2018   HCT 47.6 07/22/2018   MCV 96.4 07/22/2018   PLT 120 (L) 07/22/2018      Component Value Date/Time   NA 140 07/22/2018 0506   K 3.6 07/22/2018 0506   CL 104 07/22/2018 0506   CO2 24  07/22/2018 0506   GLUCOSE 132 (H) 07/22/2018 0506   BUN 28 (H) 07/22/2018 0506   CREATININE 2.18 (H) 07/22/2018 0506   CREATININE 2.01 (H) 05/11/2015 1339   CALCIUM 9.4 07/22/2018 0506   PROT 6.2 (L) 03/30/2016 2248   ALBUMIN 3.4 (L) 03/30/2016 2248   AST 18 03/30/2016 2248   ALT 19 03/30/2016 2248   ALKPHOS 63 03/30/2016 2248   BILITOT 0.4 03/30/2016 2248   GFRNONAA 26 (L) 07/22/2018 0506   GFRAA 31 (L) 07/22/2018 0506   Lab Results  Component Value Date   CHOL 93 03/31/2016   HDL 35 (L) 03/31/2016   LDLCALC 38 03/31/2016   TRIG 102 03/31/2016   CHOLHDL 2.7 03/31/2016   Lab Results  Component Value Date   HGBA1C 7.7 (H) 03/31/2016   Lab Results  Component Value Date   VITAMINB12 643 03/06/2017   Lab Results  Component Value Date   TSH 1.149 03/31/2016      ASSESSMENT AND PLAN 84 y.o. year old male  has a past medical history of Coronary artery disease, Diabetes mellitus without complication ([place]), Diabetic neuropathy ([place]) (09/16/2017), Gait abnormality (09/16/2017), Hyperlipidemia, Hypertension, Memory difficulty (03/06/2017), and Toxic goiter. here with:  1: Memory disturbance  The patient's memory for has remained relatively stable.  We will continue monitoring.  He will continue on Aricept 10 mg daily.  I provided the patient and his wife with a packet of resources to use as needed.  Advised that if symptoms worsen or he develop new symptoms they should let us know.  He will follow-up in 6 months or sooner if needed.   I spent 15 minutes with the patient.  50% of this time was spent reviewing the memory score   Ward Givens, MSN, NP-C 09/22/2018, 1:55 PM [facility] [address] [place], [place] [address] [phone]

## 2018-09-25 ENCOUNTER — Other Ambulatory Visit: Payer: Self-pay | Admitting: Neurology

## 2018-09-30 ENCOUNTER — Encounter: Payer: Self-pay | Admitting: Podiatry

## 2018-09-30 ENCOUNTER — Ambulatory Visit: Payer: PPO | Admitting: Podiatry

## 2018-09-30 DIAGNOSIS — M79674 Pain in right toe(s): Secondary | ICD-10-CM | POA: Diagnosis not present

## 2018-09-30 DIAGNOSIS — M79675 Pain in left toe(s): Secondary | ICD-10-CM | POA: Diagnosis not present

## 2018-09-30 DIAGNOSIS — B351 Tinea unguium: Secondary | ICD-10-CM | POA: Diagnosis not present

## 2018-09-30 DIAGNOSIS — E1142 Type 2 diabetes mellitus with diabetic polyneuropathy: Secondary | ICD-10-CM | POA: Diagnosis not present

## 2018-09-30 NOTE — Progress Notes (Signed)
Complaint:  Visit Type: Patient returns to my office for continued preventative foot care services. Complaint: Patient states" my nails have grown long and thick and become painful to walk and wear shoes" Patient has been diagnosed with DM with no foot complications. The patient presents for preventative foot care services. No changes to ROS  Podiatric Exam: Vascular: dorsalis pedis and posterior tibial pulses are not  palpable bilateral. Capillary return is immediate. Temperature gradient is WNL. Skin turgor WNL  Sensorium: Diminished  Semmes Weinstein monofilament test. Normal tactile sensation bilaterally. Nail Exam: Pt has thick disfigured discolored nails with subungual debris noted bilateral entire nail hallux through fifth toenails Ulcer Exam: There is no evidence of ulcer or pre-ulcerative changes or infection. Orthopedic Exam: Muscle tone and strength are WNL. No limitations in general ROM. No crepitus or effusions noted. Foot type and digits show no abnormalities. Bony prominences are unremarkable. Skin: No Porokeratosis. No infection or ulcers.  Asymptomatic skin lesion left heel.  Diagnosis:  Onychomycosis, , Pain in right toe, pain in left toes,    Treatment & Plan Procedures and Treatment: Consent by patient was obtained for treatment procedures. The patient understood the discussion of treatment and procedures well. All questions were answered thoroughly reviewed. Debridement of mycotic and hypertrophic toenails, 1 through 5 bilateral and clearing of subungual debris. No ulceration, no infection noted. Told to pad/wear two socks on left foot for heel skin lesion left foot. Return Visit-Office Procedure: Patient instructed to return to the office for a follow up visit 3 months for continued evaluation and treatment.    [name]

## 2018-11-18 ENCOUNTER — Encounter: Payer: Self-pay | Admitting: Interventional Cardiology

## 2018-11-25 DIAGNOSIS — I129 Hypertensive chronic kidney disease with stage 1 through stage 4 chronic kidney disease, or unspecified chronic kidney disease: Secondary | ICD-10-CM | POA: Diagnosis not present

## 2018-11-25 DIAGNOSIS — I2510 Atherosclerotic heart disease of native coronary artery without angina pectoris: Secondary | ICD-10-CM | POA: Diagnosis not present

## 2018-11-25 DIAGNOSIS — N189 Chronic kidney disease, unspecified: Secondary | ICD-10-CM | POA: Diagnosis not present

## 2018-11-25 DIAGNOSIS — E039 Hypothyroidism, unspecified: Secondary | ICD-10-CM | POA: Diagnosis not present

## 2018-11-25 DIAGNOSIS — D631 Anemia in chronic kidney disease: Secondary | ICD-10-CM | POA: Diagnosis not present

## 2018-11-25 DIAGNOSIS — E8770 Fluid overload, unspecified: Secondary | ICD-10-CM | POA: Diagnosis not present

## 2018-11-25 DIAGNOSIS — E1122 Type 2 diabetes mellitus with diabetic chronic kidney disease: Secondary | ICD-10-CM | POA: Diagnosis not present

## 2018-11-25 DIAGNOSIS — I714 Abdominal aortic aneurysm, without rupture: Secondary | ICD-10-CM | POA: Diagnosis not present

## 2018-11-25 DIAGNOSIS — E782 Mixed hyperlipidemia: Secondary | ICD-10-CM | POA: Diagnosis not present

## 2018-11-25 DIAGNOSIS — N2581 Secondary hyperparathyroidism of renal origin: Secondary | ICD-10-CM | POA: Diagnosis not present

## 2018-11-25 DIAGNOSIS — I4891 Unspecified atrial fibrillation: Secondary | ICD-10-CM | POA: Diagnosis not present

## 2018-11-25 DIAGNOSIS — N184 Chronic kidney disease, stage 4 (severe): Secondary | ICD-10-CM | POA: Diagnosis not present

## 2018-12-10 NOTE — Progress Notes (Deleted)
Cardiology Office Note:    Date:  12/10/2018   ID:  ZANDER INGHAM, DOB 1934-03-28, MRN [id number]  PCP:  Hulan Fess, MD  Cardiologist:  Sinclair Grooms, MD   Referring MD: Hulan Fess, MD   No chief complaint on file.   History of Present Illness:    [name] is a 85 y.o. male with a hx of EVAR abdominal aortic aneurysm 2010, CABG 2006, essential hypertension, prior history of lower GI bleed due to diverticulosis, hyperlipidemia, decreased memory, stage IV chronic kidney disease.  Past Medical History:  Diagnosis Date  . Coronary artery disease   . Diabetes mellitus without complication ([place])   . Diabetic neuropathy ([place]) 09/16/2017   Mild bilateral foot drops  . Gait abnormality 09/16/2017  . Hyperlipidemia   . Hypertension   . Memory difficulty 03/06/2017  . Toxic goiter     Past Surgical History:  Procedure Laterality Date  . ABDOMINAL AORTIC ANEURYSM REPAIR    . cardiac bypass    . CORONARY ARTERY BYPASS GRAFT      Current Medications: No outpatient medications have been marked as taking for the 12/11/18 encounter (Appointment) with Belva Crome, MD.     Allergies:   Patient has no known allergies.   Social History   Socioeconomic History  . Marital status: Married    Spouse name: [name]  . Number of children: 3  . Years of education: 76  . Highest education level: Not on file  Occupational History  . Not on file  Social Needs  . Financial resource strain: Not on file  . Food insecurity:    Worry: Not on file    Inability: Not on file  . Transportation needs:    Medical: Not on file    Non-medical: Not on file  Tobacco Use  . Smoking status: Former Smoker    Packs/day: 1.00    Last attempt to quit: 03/31/1996    Years since quitting: 22.7  . Smokeless tobacco: Never Used  Substance and Sexual Activity  . Alcohol use: No  . Drug use: No  . Sexual activity: Yes    Birth control/protection: None  Lifestyle  . Physical activity:    Days  per week: Not on file    Minutes per session: Not on file  . Stress: Not on file  Relationships  . Social connections:    Talks on phone: Not on file    Gets together: Not on file    Attends religious service: Not on file    Active member of club or organization: Not on file    Attends meetings of clubs or organizations: Not on file    Relationship status: Not on file  Other Topics Concern  . Not on file  Social History Narrative   Lives  With spouse   Caffeine use:  Coffee ocass   Tea/soda daily   Right-handed     Family History: [name] includes Diabetes in his brother, brother, brother, and sister; Heart attack in his brother, brother, father, and sister; Stroke in his father and mother.  ROS:   Please see the history of present illness.    *** All other systems reviewed and are negative.  EKGs/Labs/Other Studies Reviewed:    The following studies were reviewed today: ***  EKG:  EKG is ***  Recent Labs: 07/22/2018: BUN 28; Creatinine, Ser 2.18; Hemoglobin 15.3; Platelets 120; Potassium 3.6; Sodium 140  Recent Lipid Panel  Component Value Date/Time   CHOL 93 03/31/2016 0306   TRIG 102 03/31/2016 0306   HDL 35 (L) 03/31/2016 0306   CHOLHDL 2.7 03/31/2016 0306   VLDL 20 03/31/2016 0306   LDLCALC 38 03/31/2016 0306    Physical Exam:    VS:  There were no vitals taken for this visit.    Wt Readings from Last 3 Encounters:  09/22/18 241 lb (109.3 kg)  06/10/18 242 lb 12.8 oz (110.1 kg)  03/17/18 246 lb (111.6 kg)     GEN: ***. No acute distress HEENT: Normal NECK: No JVD. LYMPHATICS: No lymphadenopathy CARDIAC: ***RRR.  *** murmur, gallop, edema VASCULAR: Pulses ***, Bruits *** RESPIRATORY:  Clear to auscultation without rales, wheezing or rhonchi  ABDOMEN: Soft, non-tender, non-distended, No pulsatile mass, MUSCULOSKELETAL: No deformity  SKIN: Warm and dry NEUROLOGIC:  Alert and oriented x 3 PSYCHIATRIC:  Normal affect    ASSESSMENT:    1. Coronary artery disease involving coronary bypass graft of native heart without angina pectoris   2. Abdominal aortic aneurysm (AAA) without rupture ([place])   3. Essential hypertension   4. Chronic kidney disease, stage IV (severe) ([place])   5. Hyperlipidemia, unspecified hyperlipidemia type   6. Memory deficits    PLAN:    In order of problems listed above:  1. ***   Medication Adjustments/Labs and Tests Ordered: Current medicines are reviewed at length with the patient today.  Concerns regarding medicines are outlined above.  No orders of the defined types were placed in this encounter.  No orders of the defined types were placed in this encounter.   There are no Patient Instructions on file for this visit.   Signed, Sinclair Grooms, MD  12/10/2018 5:13 PM    [place]

## 2018-12-11 ENCOUNTER — Ambulatory Visit: Payer: PPO | Admitting: Interventional Cardiology

## 2018-12-28 DIAGNOSIS — N189 Chronic kidney disease, unspecified: Secondary | ICD-10-CM | POA: Diagnosis not present

## 2018-12-30 ENCOUNTER — Ambulatory Visit: Payer: PPO | Admitting: Podiatry

## 2019-01-20 ENCOUNTER — Ambulatory Visit: Payer: HMO | Admitting: Podiatry

## 2019-01-20 ENCOUNTER — Encounter: Payer: Self-pay | Admitting: Podiatry

## 2019-01-20 DIAGNOSIS — M2041 Other hammer toe(s) (acquired), right foot: Secondary | ICD-10-CM

## 2019-01-20 DIAGNOSIS — M2042 Other hammer toe(s) (acquired), left foot: Secondary | ICD-10-CM

## 2019-01-20 DIAGNOSIS — M79674 Pain in right toe(s): Secondary | ICD-10-CM | POA: Diagnosis not present

## 2019-01-20 DIAGNOSIS — M79675 Pain in left toe(s): Secondary | ICD-10-CM | POA: Diagnosis not present

## 2019-01-20 DIAGNOSIS — B351 Tinea unguium: Secondary | ICD-10-CM

## 2019-01-20 DIAGNOSIS — E1142 Type 2 diabetes mellitus with diabetic polyneuropathy: Secondary | ICD-10-CM

## 2019-01-20 NOTE — Progress Notes (Signed)
Complaint:  Visit Type: Patient returns to my office for continued preventative foot care services. Complaint: Patient states" my nails have grown long and thick and become painful to walk and wear shoes" Patient has been diagnosed with DM with no foot complications. The patient presents for preventative foot care services. No changes to ROS  Podiatric Exam: Vascular: dorsalis pedis and posterior tibial pulses are not  palpable bilateral. Capillary return is immediate. Temperature gradient is WNL. Skin turgor WNL  Sensorium: Diminished  Semmes Weinstein monofilament test. Normal tactile sensation bilaterally. Nail Exam: Pt has thick disfigured discolored nails with subungual debris noted bilateral entire nail hallux through fifth toenails Ulcer Exam: There is no evidence of ulcer or pre-ulcerative changes or infection. Orthopedic Exam: Muscle tone and strength are WNL. No limitations in general ROM. No crepitus or effusions noted. Hammer toes  B/L   Bony prominences are unremarkable. Skin: No Porokeratosis. No infection or ulcers.  Asymptomatic skin lesion left heel.  Diagnosis:  Onychomycosis, , Pain in right toe, pain in left toes,    Treatment & Plan Procedures and Treatment: Consent by patient was obtained for treatment procedures. The patient understood the discussion of treatment and procedures well. All questions were answered thoroughly reviewed. Debridement of mycotic and hypertrophic toenails, 1 through 5 bilateral and clearing of subungual debris. No ulceration, no infection noted. Patient qualifies for diabetic shoes for DPN and hammer toes  B/L. Return Visit-Office Procedure: Patient instructed to return to the office for a follow up visit 3 months for continued evaluation and treatment.    [name]

## 2019-02-05 DIAGNOSIS — I10 Essential (primary) hypertension: Secondary | ICD-10-CM | POA: Diagnosis not present

## 2019-02-05 DIAGNOSIS — D582 Other hemoglobinopathies: Secondary | ICD-10-CM | POA: Diagnosis not present

## 2019-02-05 DIAGNOSIS — E1122 Type 2 diabetes mellitus with diabetic chronic kidney disease: Secondary | ICD-10-CM | POA: Diagnosis not present

## 2019-02-05 DIAGNOSIS — D500 Iron deficiency anemia secondary to blood loss (chronic): Secondary | ICD-10-CM | POA: Diagnosis not present

## 2019-02-05 DIAGNOSIS — M1610 Unilateral primary osteoarthritis, unspecified hip: Secondary | ICD-10-CM | POA: Diagnosis not present

## 2019-02-05 DIAGNOSIS — I4891 Unspecified atrial fibrillation: Secondary | ICD-10-CM | POA: Diagnosis not present

## 2019-02-05 DIAGNOSIS — N183 Chronic kidney disease, stage 3 (moderate): Secondary | ICD-10-CM | POA: Diagnosis not present

## 2019-02-05 DIAGNOSIS — I25119 Atherosclerotic heart disease of native coronary artery with unspecified angina pectoris: Secondary | ICD-10-CM | POA: Diagnosis not present

## 2019-02-05 DIAGNOSIS — E782 Mixed hyperlipidemia: Secondary | ICD-10-CM | POA: Diagnosis not present

## 2019-02-05 DIAGNOSIS — N189 Chronic kidney disease, unspecified: Secondary | ICD-10-CM | POA: Diagnosis not present

## 2019-02-08 DIAGNOSIS — D631 Anemia in chronic kidney disease: Secondary | ICD-10-CM | POA: Diagnosis not present

## 2019-02-08 DIAGNOSIS — E1122 Type 2 diabetes mellitus with diabetic chronic kidney disease: Secondary | ICD-10-CM | POA: Diagnosis not present

## 2019-02-08 DIAGNOSIS — I129 Hypertensive chronic kidney disease with stage 1 through stage 4 chronic kidney disease, or unspecified chronic kidney disease: Secondary | ICD-10-CM | POA: Diagnosis not present

## 2019-02-08 DIAGNOSIS — N184 Chronic kidney disease, stage 4 (severe): Secondary | ICD-10-CM | POA: Diagnosis not present

## 2019-02-08 NOTE — Progress Notes (Signed)
Cardiology Office Note:    Date:  02/09/2019   ID:  [name], DOB 1934/02/05, MRN [id number]  PCP:  Hulan Fess, MD  Cardiologist:  Sinclair Grooms, MD   Referring MD: Hulan Fess, MD   Chief Complaint  Patient presents with  . Coronary Artery Disease    History of Present Illness:    [name] is a 85 y.o. male with a hx of coronary artery disease, abdominal aortic aneurysm, hypertension, review his coronary bypass grafting, and recent lower GI bleeding possibly from diverticulosis.   He is doing okay.  He complains of nothing.  There is no chest pain, shortness of breath, palpitations, dizziness, syncope, or orthopnea.  His wife notes lower extremity swelling which is improved after seeing nephrology and having an adjustment in his diuretic regimen.  The most recent nephrology visit was within the past 48 hours.  He does not have nitroglycerin and has not needed nitroglycerin.  He is compliant with his medical regimen.  No forget decrease in memory.  He is no longer driving.  Past Medical History:  Diagnosis Date  . Coronary artery disease   . Diabetes mellitus without complication ([place])   . Diabetic neuropathy ([place]) 09/16/2017   Mild bilateral foot drops  . Gait abnormality 09/16/2017  . Hyperlipidemia   . Hypertension   . Memory difficulty 03/06/2017  . Toxic goiter     Past Surgical History:  Procedure Laterality Date  . ABDOMINAL AORTIC ANEURYSM REPAIR    . cardiac bypass    . CORONARY ARTERY BYPASS GRAFT      Current Medications: Current Meds  Medication Sig  . acetaminophen (TYLENOL) 325 MG tablet Take 650 mg by mouth every 6 (six) hours as needed for mild pain, fever or headache.  Marland Kitchen aspirin EC 81 MG tablet Take 81 mg by mouth daily.  Marland Kitchen atorvastatin (LIPITOR) 40 MG tablet Take 40 mg by mouth every evening.   . calcitRIOL (ROCALTROL) 0.25 MCG capsule Take 0.5 mcg by mouth daily.  Marland Kitchen donepezil (ARICEPT) 10 MG tablet TAKE 1 TABLET BY MOUTH ONCE DAILY  AT  BEDTIME  . furosemide (LASIX) 80 MG tablet Take 160 mg by mouth daily.   Marland Kitchen levothyroxine (SYNTHROID, LEVOTHROID) 75 MCG tablet Take 75 mcg by mouth daily before breakfast.  . loratadine (CLARITIN) 10 MG tablet Take 10 mg by mouth daily as needed for allergies.   . metoprolol succinate (TOPROL-XL) 25 MG 24 hr tablet Take 1 tablet (25 mg total) by mouth daily.  . Multiple Vitamins-Minerals (MULTIVITAMIN ADULTS PO) Take 1 tablet by mouth daily.  . niacin (NIASPAN) 1000 MG CR tablet Take 1,000 mg by mouth 2 (two) times daily.  Marland Kitchen NOVOLIN N RELION 100 UNIT/ML injection INJECT 20 UNITS SUBCUTANEOUSLY TWICE DAILY  . NOVOLIN R RELION 100 UNIT/ML injection INJECT 25 TO 30 UNITS SUBCUTANEOUSLY THREE TIMES DAILY BEFORE MEAL(S)  . ONETOUCH VERIO test strip   . potassium chloride (K-DUR) 10 MEQ tablet Take 10 mEq by mouth 2 (two) times daily with a meal.  . sertraline (ZOLOFT) 50 MG tablet Take 50 mg by mouth daily.     Allergies:   Patient has no known allergies.   Social History   Socioeconomic History  . Marital status: Married    Spouse name: [name]  . Number of children: 3  . Years of education: 17  . Highest education level: Not on file  Occupational History  . Not on file  Social Needs  .  Financial resource strain: Not on file  . Food insecurity:    Worry: Not on file    Inability: Not on file  . Transportation needs:    Medical: Not on file    Non-medical: Not on file  Tobacco Use  . Smoking status: Former Smoker    Packs/day: 1.00    Last attempt to quit: 03/31/1996    Years since quitting: 22.8  . Smokeless tobacco: Never Used  Substance and Sexual Activity  . Alcohol use: No  . Drug use: No  . Sexual activity: Yes    Birth control/protection: None  Lifestyle  . Physical activity:    Days per week: Not on file    Minutes per session: Not on file  . Stress: Not on file  Relationships  . Social connections:    Talks on phone: Not on file    Gets together: Not on  file    Attends religious service: Not on file    Active member of club or organization: Not on file    Attends meetings of clubs or organizations: Not on file    Relationship status: Not on file  Other Topics Concern  . Not on file  Social History Narrative   Lives  With spouse   Caffeine use:  Coffee ocass   Tea/soda daily   Right-handed     Family History: [name] includes Diabetes in his brother, brother, brother, and sister; Heart attack in his brother, brother, father, and sister; Stroke in his father and mother.  ROS:   Please see the history of present illness.    Decreased memory but states it does not bother him.  Difficulty with balance.  Several falls.  No longer driving.  All other systems reviewed and are negative.  EKGs/Labs/Other Studies Reviewed:    The following studies were reviewed today: No recent cardiac evaluation or imaging.  EKG:  EKG performed on June 10, 2018 demonstrates normal sinus rhythm/bradycardia, left axis deviation, poor R wave progression V2 through V4, and no change when compared to prior.  Recent Labs: 07/22/2018: BUN 28; Creatinine, Ser 2.18; Hemoglobin 15.3; Platelets 120; Potassium 3.6; Sodium 140  Recent Lipid Panel    Component Value Date/Time   CHOL 93 03/31/2016 0306   TRIG 102 03/31/2016 0306   HDL 35 (L) 03/31/2016 0306   CHOLHDL 2.7 03/31/2016 0306   VLDL 20 03/31/2016 0306   LDLCALC 38 03/31/2016 0306    Physical Exam:    VS:  BP 120/60   Pulse (!) 57   Ht 5\' 10"  (1.778 m)   Wt 233 lb 1.9 oz (105.7 kg)   SpO2 97%   BMI 33.45 kg/m     Wt Readings from Last 3 Encounters:  02/09/19 233 lb 1.9 oz (105.7 kg)  09/22/18 241 lb (109.3 kg)  06/10/18 242 lb 12.8 oz (110.1 kg)     GEN: Elderly, obese.. No acute distress HEENT: Normal NECK: No JVD. LYMPHATICS: No lymphadenopathy CARDIAC: RRR.  2/6 systolic murmur murmur, no gallop, 2+ bilateral ankle and shin edema VASCULAR: Unable to feel pedal  pulses, no bruits RESPIRATORY:  Clear to auscultation without rales, wheezing or rhonchi  ABDOMEN: Soft, non-tender, non-distended, No pulsatile mass, MUSCULOSKELETAL: No deformity  SKIN: Warm and dry NEUROLOGIC:  Alert and oriented x 3 PSYCHIATRIC:  Normal affect   ASSESSMENT:    1. Coronary artery disease involving native coronary artery of native heart without angina pectoris   2. Essential hypertension  3. Abdominal aortic aneurysm (AAA) without rupture ([place])   4. Diabetes mellitus due to underlying condition with diabetic nephropathy, with long-term current use of insulin ([place])   5. Hyperlipidemia, unspecified hyperlipidemia type   6. Chronic kidney disease, stage IV (severe) (HCC)    PLAN:    In order of problems listed above:  1. Stable without obvious anginal quality symptoms.  Discussed secondary preventative measures. 2. Target blood pressure 130/80 mmHg.  Currently at target. 3. Had endovascular aortic aneurysm repair and is being followed by others. 4. A1c was 7.1.  This was nearly a year ago.  Being managed by primary care. 5. LDL was excellent and less than 60 when done within the past 12 months. 6. CKD stage IV.  Follows chronically with nephrology.  Overall education and awareness concerning primary/secondary risk prevention was discussed in detail: LDL less than 70, hemoglobin A1c less than 7, blood pressure target less than 130/80 mmHg, >150 minutes of moderate aerobic activity per week, avoidance of smoking, weight control (via diet and exercise), and continued surveillance/management of/for obstructive sleep apnea.  1 year follow-up   Medication Adjustments/Labs and Tests Ordered: Current medicines are reviewed at length with the patient today.  Concerns regarding medicines are outlined above.  No orders of the defined types were placed in this encounter.  No orders of the defined types were placed in this encounter.   Patient Instructions  Medication  Instructions:  Your physician recommends that you continue on your current medications as directed. Please refer to the Current Medication list given to you today.  If you need a refill on your cardiac medications before your next appointment, please call your pharmacy.   Lab work: None  If you have labs (blood work) drawn today and your tests are completely normal, you will receive your results only by: Marland Kitchen MyChart Message (if you have MyChart) OR . A paper copy in the mail If you have any lab test that is abnormal or we need to change your treatment, we will call you to review the results.  Testing/Procedures: None  Follow-Up: At [facility], you and your health needs are our priority.  As part of our continuing mission to provide you with exceptional heart care, we have created designated Provider Care Teams.  These Care Teams include your primary Cardiologist (physician) and Advanced Practice Providers (APPs -  Physician Assistants and Nurse Practitioners) who all work together to provide you with the care you need, when you need it. You will need a follow up appointment in 12 months.  Please call our office 2 months in advance to schedule this appointment.  You may see Sinclair Grooms, MD or one of the following Advanced Practice Providers on your designated Care Team:   Truitt Merle, NP Cecilie Kicks, NP . Kathyrn Drown, NP  Any Other Special Instructions Will Be Listed Below (If Applicable).       Signed, Sinclair Grooms, MD  02/09/2019 4:14 PM    [facility]

## 2019-02-09 ENCOUNTER — Ambulatory Visit: Payer: HMO | Admitting: Interventional Cardiology

## 2019-02-09 ENCOUNTER — Encounter: Payer: Self-pay | Admitting: Interventional Cardiology

## 2019-02-09 VITALS — BP 120/60 | HR 57 | Ht 70.0 in | Wt 233.1 lb

## 2019-02-09 DIAGNOSIS — I714 Abdominal aortic aneurysm, without rupture, unspecified: Secondary | ICD-10-CM

## 2019-02-09 DIAGNOSIS — N184 Chronic kidney disease, stage 4 (severe): Secondary | ICD-10-CM | POA: Diagnosis not present

## 2019-02-09 DIAGNOSIS — I10 Essential (primary) hypertension: Secondary | ICD-10-CM

## 2019-02-09 DIAGNOSIS — E0821 Diabetes mellitus due to underlying condition with diabetic nephropathy: Secondary | ICD-10-CM

## 2019-02-09 DIAGNOSIS — I2510 Atherosclerotic heart disease of native coronary artery without angina pectoris: Secondary | ICD-10-CM | POA: Diagnosis not present

## 2019-02-09 DIAGNOSIS — Z794 Long term (current) use of insulin: Secondary | ICD-10-CM | POA: Diagnosis not present

## 2019-02-09 DIAGNOSIS — E785 Hyperlipidemia, unspecified: Secondary | ICD-10-CM | POA: Diagnosis not present

## 2019-02-09 NOTE — Patient Instructions (Signed)

## 2019-02-15 ENCOUNTER — Other Ambulatory Visit: Payer: Self-pay

## 2019-02-15 NOTE — Patient Outreach (Signed)
  [place] [facility]) Care Management Chronic Special Needs Program  02/15/2019  Name: [name] DOB: August 19, 1934  MRN: [id number]  Mr. [name] is enrolled in a chronic special needs plan for Diabetes. Client called with no answer No answer and HIPAA compliant message left. Plan for 2d outreach call in one week Chronic care management coordinator will attempt outreach in one week.   Peter Garter RN, Jackquline Denmark, CDE Chronic Care Management Coordinator [place] [facility] [phone]

## 2019-02-17 ENCOUNTER — Other Ambulatory Visit: Payer: HMO

## 2019-02-17 NOTE — Patient Outreach (Signed)
  [facility] [facility]) Care Management Chronic Special Needs Program  02/17/2019  Name: [name] DOB: [birth year]  MRN: [id number]  Mr. [name] is enrolled in a chronic special needs plan for Diabetes. Client called with no answer No answer and HIPAA compliant message left. Plan for 3rd outreach call in 3-4 days Chronic care management coordinator will attempt outreach in  3-4 days.   Peter Garter RN, Jackquline Denmark, CDE Chronic Care Management Coordinator [place] [facility] [phone]

## 2019-02-22 ENCOUNTER — Other Ambulatory Visit: Payer: HMO

## 2019-02-22 NOTE — Patient Outreach (Signed)
  [place] [facility]) Care Management Chronic Special Needs Program  02/22/2019  Name: VIDUR KNUST DOB: 02-23-1934  MRN: [id number]  Mr. Darryel Diodato is enrolled in a chronic special needs plan for Diabetes. Client called with no answer No answer and HIPAA compliant message left. Plan to develop Interdisciplinary care plan if call not returned in 1-2 days Chronic care management coordinator will attempt outreach in 6 Months.   Peter Garter RN, Jackquline Denmark, CDE Chronic Care Management Coordinator [place] [facility] [phone]

## 2019-02-24 ENCOUNTER — Telehealth: Payer: Self-pay | Admitting: Pharmacist

## 2019-02-24 ENCOUNTER — Other Ambulatory Visit: Payer: Self-pay

## 2019-02-24 NOTE — Patient Outreach (Signed)
  [facility] [facility]) Care Management Chronic Special Needs Program  02/24/2019  Name: [name] DOB: [birth year]  MRN: [id number]  Mr. [name] is enrolled in a chronic special needs plan for Diabetes. Client has not responded to three outreach attempts.  The client's individualized care plan was developed based upon the completed health risk assessment. Plan:   . Send unsuccessful outreach letter with a copy of individualized care plan to client . Send individualized care plan to provider . Send educational materials heart disease, HTN . Send referral to pharmacy for >8 medications  Chronic care management coordinator will attempt outreach in 2-4 months.     Roy Garter RN, Jackquline Denmark, CDE Chronic Care Management Coordinator [place] [facility] [phone]

## 2019-02-24 NOTE — Telephone Encounter (Signed)
-----   Message from Darvin Neighbours sent at 02/24/2019 11:35 AM EDT ----- Regarding: FW: Order for Blaine Hamper morning Alwyn Ren,   Please engage Mr. Dyar for medication review per the following referral.   Thanks,   [place] Management Assistant  Please see message below: ----- Message ----- From: Dimitri Ped, RN Sent: 02/24/2019  10:00 AM EDT To: Thn Cm Communication Orders Subject: Order for MANDO, BLATZ                          Patient Name: [name], [name] F([id number]) Sex: Male DOB: [birth year]    PCP: LITTLE, [place]: [facility]   Types of orders made on 02/24/2019: Nursing  Order Date:02/24/2019 Ordering [place], Marland Kitchen [[id number]] Encounter Provider:Sandlin, Marland Kitchen, RN [[id number]] Authorizing Prov ider: Hulan Fess, MD [phone] Department:[facility]-LINK TO [place]  Order Specific Information Order: Comm to Pharmacy [Custom: [id number]]  Order #: [id number] Qty: 1   Priority: Routine  Class: Clinic Performed   Comment:CSNP client with >8 meds.  Please review medications     Reason for Consult -> Polypharmacy     Disposition: Return in about 3 months (around 05/27/2019).   Priority:  Routine  Class: Clinic Performed   Comment:CSNP client with >8 meds.  Please review medications     Reason for Consult -> Polypharmacy

## 2019-02-24 NOTE — Patient Outreach (Signed)
[place] [facility]) Care Management  02/24/2019  DECLAN MIER [birth year] [id number]   Patient was called regarding medication review as he is part of the [place] program. Unfortunately, patient did not answer the phone. HIPAA compliant message was left on his voicemail. [facility] Nurse, Peter Garter attempted to call the patient as well and did not get him today. She sent an unsuccessful contact letter.  Plan: Call patient back in 7-10 business days.   Elayne Guerin, PharmD, [place] Clinical Pharmacist [phone]

## 2019-03-10 ENCOUNTER — Ambulatory Visit: Payer: Self-pay | Admitting: Pharmacist

## 2019-03-10 ENCOUNTER — Other Ambulatory Visit: Payer: Self-pay | Admitting: Pharmacist

## 2019-03-10 NOTE — Patient Outreach (Signed)
[facility]) Care Management  03/10/2019  [name] 07/24/1934 [id number]  Patient was called for medication review as he is part of the [place] program. Unfortunately, he did not answer the phone. HIPAA compliant message was left on the patient's voicemail.  Today's call was the 2nd unsuccessful call. Client letter  was sent to the patient on 02/24/2019.  Plan: Call patient back in 4-6 weeks due to referral backlog. Send unsuccessful outreach letter today.   Elayne Guerin, PharmD, [place] Clinical Pharmacist [phone]

## 2019-03-23 ENCOUNTER — Other Ambulatory Visit: Payer: Self-pay

## 2019-03-23 ENCOUNTER — Ambulatory Visit (INDEPENDENT_AMBULATORY_CARE_PROVIDER_SITE_OTHER): Payer: HMO | Admitting: Orthotics

## 2019-03-23 DIAGNOSIS — E1142 Type 2 diabetes mellitus with diabetic polyneuropathy: Secondary | ICD-10-CM | POA: Diagnosis not present

## 2019-03-23 DIAGNOSIS — M2042 Other hammer toe(s) (acquired), left foot: Secondary | ICD-10-CM

## 2019-03-23 DIAGNOSIS — M2041 Other hammer toe(s) (acquired), right foot: Secondary | ICD-10-CM | POA: Diagnosis not present

## 2019-03-23 DIAGNOSIS — E1159 Type 2 diabetes mellitus with other circulatory complications: Secondary | ICD-10-CM

## 2019-03-23 NOTE — Progress Notes (Signed)

## 2019-04-13 ENCOUNTER — Other Ambulatory Visit: Payer: Self-pay | Admitting: Pharmacist

## 2019-04-13 ENCOUNTER — Ambulatory Visit: Payer: Self-pay | Admitting: Pharmacist

## 2019-04-13 NOTE — Patient Outreach (Signed)
[facility]) Care Management  04/13/2019  [name] [birth year] [id number]  Patient was called regarding medication review and medication assistance evaluation. Unfortunately, he did not answer the phone. HIPAA compliant message was left on his voicemail. Today's call was the 3rd unsuccessful call.  Patient was sent an unsuccessful outreach letter 02/24/2019  Plan: Call patient back in 1 month.    Elayne Guerin, PharmD, [place] Clinical Pharmacist [phone]

## 2019-04-21 ENCOUNTER — Ambulatory Visit: Payer: HMO | Admitting: Podiatry

## 2019-04-21 ENCOUNTER — Encounter: Payer: Self-pay | Admitting: Podiatry

## 2019-04-21 ENCOUNTER — Other Ambulatory Visit: Payer: Self-pay

## 2019-04-21 DIAGNOSIS — E1159 Type 2 diabetes mellitus with other circulatory complications: Secondary | ICD-10-CM | POA: Diagnosis not present

## 2019-04-21 DIAGNOSIS — B351 Tinea unguium: Secondary | ICD-10-CM

## 2019-04-21 DIAGNOSIS — M79675 Pain in left toe(s): Secondary | ICD-10-CM | POA: Diagnosis not present

## 2019-04-21 DIAGNOSIS — M79674 Pain in right toe(s): Secondary | ICD-10-CM | POA: Diagnosis not present

## 2019-04-21 DIAGNOSIS — E1142 Type 2 diabetes mellitus with diabetic polyneuropathy: Secondary | ICD-10-CM | POA: Diagnosis not present

## 2019-04-21 NOTE — Progress Notes (Signed)
Complaint:  Visit Type: Patient returns to my office for continued preventative foot care services. Complaint: Patient states" my nails have grown long and thick and become painful to walk and wear shoes" Patient has been diagnosed with DM with no foot complications. The patient presents for preventative foot care services. No changes to ROS  Podiatric Exam: Vascular: dorsalis pedis and posterior tibial pulses are not  palpable bilateral. Capillary return is immediate. Temperature gradient is WNL. Skin turgor WNL  Sensorium: Diminished  Semmes Weinstein monofilament test. Normal tactile sensation bilaterally. Nail Exam: Pt has thick disfigured discolored nails with subungual debris noted bilateral entire nail hallux through fifth toenails Ulcer Exam: There is no evidence of ulcer or pre-ulcerative changes or infection. Orthopedic Exam: Muscle tone and strength are WNL. No limitations in general ROM. No crepitus or effusions noted. Foot type and digits show no abnormalities. Bony prominences are unremarkable. Skin: No Porokeratosis. No infection or ulcers.  Asymptomatic skin lesion left heel.  Diagnosis:  Onychomycosis, , Pain in right toe, pain in left toes,    Treatment & Plan Procedures and Treatment: Consent by patient was obtained for treatment procedures. The patient understood the discussion of treatment and procedures well. All questions were answered thoroughly reviewed. Debridement of mycotic and hypertrophic toenails, 1 through 5 bilateral and clearing of subungual debris. No ulceration, no infection noted.  Return Visit-Office Procedure: Patient instructed to return to the office for a follow up visit 3 months for continued evaluation and treatment.    [name]

## 2019-05-18 ENCOUNTER — Ambulatory Visit: Payer: Self-pay | Admitting: Pharmacist

## 2019-05-18 ENCOUNTER — Other Ambulatory Visit: Payer: Self-pay | Admitting: Interventional Cardiology

## 2019-05-18 ENCOUNTER — Other Ambulatory Visit: Payer: Self-pay | Admitting: Pharmacist

## 2019-05-18 NOTE — Patient Outreach (Signed)
[place] [facility]) Care Management  05/18/2019  EMMAUS BRANDI [birth year] [id number]  Patient was called regarding medication review and medication assistance evaluation. Unfortunately, he did not answer the phone. HIPAA compliant message was left on his voicemail. Today's call was the 4th unsuccessful call.  Patient was sent an unsuccessful outreach letter 02/24/2019  Plan: Call patient back in 3 months.    Elayne Guerin, PharmD, [place] Clinical Pharmacist [phone]

## 2019-05-20 ENCOUNTER — Other Ambulatory Visit: Payer: Self-pay

## 2019-05-20 NOTE — Patient Outreach (Signed)
  [facility]) Care Management Chronic Special Needs Program  05/20/2019  Name: [name] DOB: 1934/06/03  MRN: [id number]  Mr. [name] is enrolled in a chronic special needs plan for Diabetes. Client called with no answer No answer and HIPAA compliant message left. Plan for 2nd outreach call in one week Chronic care management coordinator will attempt outreach in one week.   Peter Garter RN, Jackquline Denmark, CDE Chronic Care Management Coordinator [place] [facility] [phone]

## 2019-05-28 ENCOUNTER — Other Ambulatory Visit: Payer: Self-pay

## 2019-05-28 NOTE — Patient Outreach (Signed)
  [place] [facility]) Care Management Chronic Special Needs Program  05/28/2019  Name: [name] DOB: 05-Feb-1934  MRN: [id number]  Mr. [name] is enrolled in a chronic special needs plan for Diabetes. Client called with no answer No answer and HIPAA compliant message left. 2nd attempt Plan for 3rd outreach call in one week Chronic care management coordinator will attempt outreach in one week.   Peter Garter RN, Jackquline Denmark, CDE Chronic Care Management Coordinator [place] [facility] [phone]

## 2019-05-31 ENCOUNTER — Telehealth: Payer: Self-pay

## 2019-05-31 NOTE — Telephone Encounter (Signed)
Unable to get in contact with the patient to convert their office appt with Megan on 06/01/2019 into a mychart video visit. I left a voicemail asking the patient to return my call. Office number was provided.    If patient calls back please convert their office visit into a mychart video visit.

## 2019-06-01 ENCOUNTER — Ambulatory Visit: Payer: PPO | Admitting: Adult Health

## 2019-06-01 NOTE — Telephone Encounter (Signed)
Unable to get in contact with the patient. I left a voicemail asking the patient to return my call. I advised that if we don't confirmation for the appt by 11 am that the appt will be cancelled and he will need to call back and r/s. Office number was provided.

## 2019-06-03 ENCOUNTER — Other Ambulatory Visit: Payer: Self-pay

## 2019-06-03 NOTE — Patient Outreach (Signed)
  [facility] [facility]) Care Management Chronic Special Needs Program  06/03/2019  Name: [name] DOB: 11/19/1934  MRN: [id number]  Mr. [name] is enrolled in a chronic special needs plan for Diabetes. Client called with no answer No answer and HIPAA compliant message left. 3rd attempt Plan to update individualized care plan using available data in 5-10 days if client does not respond to outreach message Chronic care management coordinator will attempt outreach in 3 Months.   Peter Garter RN, Jackquline Denmark, CDE Chronic Care Management Coordinator [place] [facility] [phone]

## 2019-06-18 ENCOUNTER — Other Ambulatory Visit: Payer: Self-pay | Admitting: Neurology

## 2019-06-18 ENCOUNTER — Other Ambulatory Visit: Payer: Self-pay

## 2019-06-18 NOTE — Patient Outreach (Signed)
  [place] [facility]) Care Management Chronic Special Needs Program  06/18/2019  Name: AMOGH KOMATSU DOB: 22-Feb-1934  MRN: [id number]  Mr. Kayd Launer is enrolled in a chronic special needs plan for Diabetes. Reviewed and updated care plan.   Goals Addressed            This Visit's Progress   . Client understands the importance of follow-up with providers by attending scheduled visits   On track   . Client will report no worsening of symptoms related to heart disease within the next 6 months    No change   . Client will use Assistive Devices as needed and verbalize understanding of device use   On track   . Client will verbalize knowledge of self management of Hypertension as evidences by BP reading of 140/90 or less; or as defined by provider   No change   . HEMOGLOBIN A1C < 7.0       Diabetes self management actions:  Glucose monitoring per provider recommendations  Eat Healthy  Check feet daily  Visit provider every 3-6 months as directed  Hbg A1C level every 3-6 months.  Eye Exam yearly    . Maintain timely refills of diabetic medication as prescribed within the year .   On track   . Obtain annual  Lipid Profile, LDL-C   On track   . Obtain Annual Eye (retinal)  Exam    On track   . COMPLETED: Obtain Annual Foot Exam       Completed 04/21/19    . Obtain annual screen for micro albuminuria (urine) , nephropathy (kidney problems)   On track   . Obtain Hemoglobin A1C at least 2 times per year   Not on track   . Visit Primary Care Provider or Endocrinologist at least 2 times per year    On track    Primary care visit 02/05/19     Client has not responded to 3 outreach attempts by [facility] to update individualized care plan  The client's individualized care plan was developed based on available data.  Plan:  . Send unsuccessful outreach letter with a copy of updated individualized care plan to client . Send individualized care plan to provider  Chronic care  management coordinator will attempt outreach in 3-4 months.     Peter Garter RN, Jackquline Denmark, CDE Chronic Care Management Coordinator [place] [facility] [phone]

## 2019-07-01 DIAGNOSIS — E782 Mixed hyperlipidemia: Secondary | ICD-10-CM | POA: Diagnosis not present

## 2019-07-01 DIAGNOSIS — I10 Essential (primary) hypertension: Secondary | ICD-10-CM | POA: Diagnosis not present

## 2019-07-01 DIAGNOSIS — E1122 Type 2 diabetes mellitus with diabetic chronic kidney disease: Secondary | ICD-10-CM | POA: Diagnosis not present

## 2019-07-01 DIAGNOSIS — Z794 Long term (current) use of insulin: Secondary | ICD-10-CM | POA: Diagnosis not present

## 2019-07-01 DIAGNOSIS — D500 Iron deficiency anemia secondary to blood loss (chronic): Secondary | ICD-10-CM | POA: Diagnosis not present

## 2019-07-01 DIAGNOSIS — N183 Chronic kidney disease, stage 3 (moderate): Secondary | ICD-10-CM | POA: Diagnosis not present

## 2019-07-01 DIAGNOSIS — D582 Other hemoglobinopathies: Secondary | ICD-10-CM | POA: Diagnosis not present

## 2019-07-15 DIAGNOSIS — Z794 Long term (current) use of insulin: Secondary | ICD-10-CM | POA: Diagnosis not present

## 2019-07-15 DIAGNOSIS — E782 Mixed hyperlipidemia: Secondary | ICD-10-CM | POA: Diagnosis not present

## 2019-07-15 DIAGNOSIS — E1122 Type 2 diabetes mellitus with diabetic chronic kidney disease: Secondary | ICD-10-CM | POA: Diagnosis not present

## 2019-07-15 DIAGNOSIS — I10 Essential (primary) hypertension: Secondary | ICD-10-CM | POA: Diagnosis not present

## 2019-07-15 DIAGNOSIS — D500 Iron deficiency anemia secondary to blood loss (chronic): Secondary | ICD-10-CM | POA: Diagnosis not present

## 2019-07-15 DIAGNOSIS — I25119 Atherosclerotic heart disease of native coronary artery with unspecified angina pectoris: Secondary | ICD-10-CM | POA: Diagnosis not present

## 2019-07-15 DIAGNOSIS — D582 Other hemoglobinopathies: Secondary | ICD-10-CM | POA: Diagnosis not present

## 2019-07-15 DIAGNOSIS — N183 Chronic kidney disease, stage 3 (moderate): Secondary | ICD-10-CM | POA: Diagnosis not present

## 2019-07-16 DIAGNOSIS — G4733 Obstructive sleep apnea (adult) (pediatric): Secondary | ICD-10-CM | POA: Diagnosis not present

## 2019-07-16 DIAGNOSIS — R609 Edema, unspecified: Secondary | ICD-10-CM | POA: Diagnosis not present

## 2019-07-16 DIAGNOSIS — D696 Thrombocytopenia, unspecified: Secondary | ICD-10-CM | POA: Diagnosis not present

## 2019-07-16 DIAGNOSIS — E782 Mixed hyperlipidemia: Secondary | ICD-10-CM | POA: Diagnosis not present

## 2019-07-16 DIAGNOSIS — N183 Chronic kidney disease, stage 3 (moderate): Secondary | ICD-10-CM | POA: Diagnosis not present

## 2019-07-16 DIAGNOSIS — I25119 Atherosclerotic heart disease of native coronary artery with unspecified angina pectoris: Secondary | ICD-10-CM | POA: Diagnosis not present

## 2019-07-16 DIAGNOSIS — Z9889 Other specified postprocedural states: Secondary | ICD-10-CM | POA: Diagnosis not present

## 2019-07-16 DIAGNOSIS — E1122 Type 2 diabetes mellitus with diabetic chronic kidney disease: Secondary | ICD-10-CM | POA: Diagnosis not present

## 2019-07-16 DIAGNOSIS — R413 Other amnesia: Secondary | ICD-10-CM | POA: Diagnosis not present

## 2019-07-16 DIAGNOSIS — I10 Essential (primary) hypertension: Secondary | ICD-10-CM | POA: Diagnosis not present

## 2019-07-21 DIAGNOSIS — E1122 Type 2 diabetes mellitus with diabetic chronic kidney disease: Secondary | ICD-10-CM | POA: Diagnosis not present

## 2019-07-21 DIAGNOSIS — I129 Hypertensive chronic kidney disease with stage 1 through stage 4 chronic kidney disease, or unspecified chronic kidney disease: Secondary | ICD-10-CM | POA: Diagnosis not present

## 2019-07-21 DIAGNOSIS — D631 Anemia in chronic kidney disease: Secondary | ICD-10-CM | POA: Diagnosis not present

## 2019-07-21 DIAGNOSIS — N184 Chronic kidney disease, stage 4 (severe): Secondary | ICD-10-CM | POA: Diagnosis not present

## 2019-07-28 ENCOUNTER — Ambulatory Visit: Payer: HMO | Admitting: Podiatry

## 2019-08-31 ENCOUNTER — Ambulatory Visit: Payer: Self-pay | Admitting: Pharmacist

## 2019-08-31 ENCOUNTER — Other Ambulatory Visit: Payer: Self-pay | Admitting: Pharmacist

## 2019-08-31 NOTE — Patient Outreach (Signed)
[facility] Care Management  08/31/2019  [name] 06/04/1934 [id number]   Patient was called regarding CSNP medication review. HIPAA compliant message was left with a woman who answered the phone and said the patient was not available at the time of my call. She requested that I call him back later today (after 3:30pm).  Several unsuccessful attempts have been made to reach the patient over the last few months.  Plan: Await a call back from the patient. Attempt to call the patient back later today if my call schedule allows. Call patient back in 2-3 weeks due to upcoming PAL.  Elayne Guerin, PharmD, [place] Clinical Pharmacist [phone]

## 2019-09-17 ENCOUNTER — Other Ambulatory Visit: Payer: Self-pay | Admitting: Pharmacist

## 2019-09-17 NOTE — Patient Outreach (Signed)
[place] [facility]) Care Management  09/17/2019  [name] 06-04-1934 [id number]  Patient was called regarding CSNP medication review. HIPAA compliant message was left on the home phone number voicemail.   Several unsuccessful attempts have been made to reach the patient over the last few months.  Plan: Call patient back in 6-8 weeks.  Elayne Guerin, PharmD, [place] Clinical Pharmacist [phone]

## 2019-09-24 ENCOUNTER — Other Ambulatory Visit: Payer: Self-pay

## 2019-09-24 ENCOUNTER — Ambulatory Visit: Payer: Self-pay | Admitting: Pharmacist

## 2019-09-24 NOTE — Patient Outreach (Signed)
  [facility] [facility] Care Management Chronic Special Needs Program  09/24/2019  Name: [name] DOB: Feb 14, 1934  MRN: [id number]  Mr. [name] is enrolled in a chronic special needs plan for Diabetes. Client called with no answer No answer and HIPAA compliant message left. 1st attempt Plan for 2nd outreach call in one week Chronic care management coordinator will attempt outreach in one week.   Peter Garter RN, Jackquline Denmark, CDE Chronic Care Management Coordinator [place] [facility] [phone]

## 2019-10-01 ENCOUNTER — Other Ambulatory Visit: Payer: Self-pay

## 2019-10-01 NOTE — Patient Outreach (Signed)
  [facility] [facility]) Care Management Chronic Special Needs Program  10/01/2019  Name: [name] DOB: [birth year]  MRN: [id number]  Mr. [name] is enrolled in a chronic special needs plan for Diabetes. Client called with no answer No answer and HIPAA compliant message left. 2nd attempt Plan for 3rd outreach call in one week Chronic care management coordinator will attempt outreach in one week.   Peter Garter RN, Jackquline Denmark, CDE Chronic Care Management Coordinator [place] [facility] [phone]

## 2019-10-11 ENCOUNTER — Other Ambulatory Visit: Payer: Self-pay

## 2019-10-11 NOTE — Patient Outreach (Signed)
  [facility]) Care Management Chronic Special Needs Program  10/11/2019  Name: REBECCA MOTTA DOB: [birth year]  MRN: [id number]  Mr. Eugenio Dollins is enrolled in a chronic special needs plan for Diabetes. Client call and no answer.  Left HIPAA compliant message.  3rd attempt   Reviewed and updated care plan.   Goals Addressed            This Visit's Progress   . Client understands the importance of follow-up with providers by attending scheduled visits   On track   . Client will report no worsening of symptoms related to heart disease within the next 6 months    On track   . Client will use Assistive Devices as needed and verbalize understanding of device use   On track   . Client will verbalize knowledge of self management of Hypertension as evidences by BP reading of 140/90 or less; or as defined by provider   On track   . Maintain timely refills of diabetic medication as prescribed within the year .   On track   . Obtain annual  Lipid Profile, LDL-C   On track   . Obtain Annual Eye (retinal)  Exam    On track   . Obtain annual screen for micro albuminuria (urine) , nephropathy (kidney problems)   On track   . Obtain Hemoglobin A1C at least 2 times per year   On track    Completed 07/19/19    . COMPLETED: Visit Primary Care Provider or Endocrinologist at least 2 times per year        Primary care visit 02/05/19, 07/16/19      Client has not responded to 3 outreach attempts by [facility] to update individualized care plan  The client's individualized care plan was developed based on available data.  Plan:  . Send unsuccessful outreach letter with a copy of updated individualized care plan to client . Send individualized care plan to provider  Chronic care management coordinator will attempt outreach in 4-6 months.  Peter Garter RN, Jackquline Denmark, CDE Chronic Care Management Coordinator [place] [facility] Care [facility] [phone]

## 2019-10-22 DIAGNOSIS — R413 Other amnesia: Secondary | ICD-10-CM | POA: Diagnosis not present

## 2019-10-22 DIAGNOSIS — G4733 Obstructive sleep apnea (adult) (pediatric): Secondary | ICD-10-CM | POA: Diagnosis not present

## 2019-10-22 DIAGNOSIS — R609 Edema, unspecified: Secondary | ICD-10-CM | POA: Diagnosis not present

## 2019-10-22 DIAGNOSIS — Z9889 Other specified postprocedural states: Secondary | ICD-10-CM | POA: Diagnosis not present

## 2019-10-22 DIAGNOSIS — E1122 Type 2 diabetes mellitus with diabetic chronic kidney disease: Secondary | ICD-10-CM | POA: Diagnosis not present

## 2019-10-22 DIAGNOSIS — E782 Mixed hyperlipidemia: Secondary | ICD-10-CM | POA: Diagnosis not present

## 2019-10-22 DIAGNOSIS — I25119 Atherosclerotic heart disease of native coronary artery with unspecified angina pectoris: Secondary | ICD-10-CM | POA: Diagnosis not present

## 2019-10-22 DIAGNOSIS — I10 Essential (primary) hypertension: Secondary | ICD-10-CM | POA: Diagnosis not present

## 2019-10-22 DIAGNOSIS — D696 Thrombocytopenia, unspecified: Secondary | ICD-10-CM | POA: Diagnosis not present

## 2019-10-22 DIAGNOSIS — N183 Chronic kidney disease, stage 3 unspecified: Secondary | ICD-10-CM | POA: Diagnosis not present

## 2019-10-28 ENCOUNTER — Other Ambulatory Visit: Payer: Self-pay | Admitting: Pharmacist

## 2019-10-28 NOTE — Patient Outreach (Signed)
[facility]) Care Management  10/28/2019  [name] October 14, 1934 [id number]   Patient was called regarding CSNP med review. Unfortunately, he did not answer his phone. HIPAA compliant message was left on his voicemail.  Plan: Call patient back in 3 months. Patient has been called multiple times unsuccessfully.  Elayne Guerin, PharmD, [place] Clinical Pharmacist [phone]

## 2019-10-29 ENCOUNTER — Ambulatory Visit: Payer: Self-pay | Admitting: Pharmacist

## 2019-11-10 DIAGNOSIS — D631 Anemia in chronic kidney disease: Secondary | ICD-10-CM | POA: Diagnosis not present

## 2019-11-10 DIAGNOSIS — M1610 Unilateral primary osteoarthritis, unspecified hip: Secondary | ICD-10-CM | POA: Diagnosis not present

## 2019-11-10 DIAGNOSIS — E782 Mixed hyperlipidemia: Secondary | ICD-10-CM | POA: Diagnosis not present

## 2019-11-10 DIAGNOSIS — I25119 Atherosclerotic heart disease of native coronary artery with unspecified angina pectoris: Secondary | ICD-10-CM | POA: Diagnosis not present

## 2019-11-10 DIAGNOSIS — E1122 Type 2 diabetes mellitus with diabetic chronic kidney disease: Secondary | ICD-10-CM | POA: Diagnosis not present

## 2019-11-10 DIAGNOSIS — N183 Chronic kidney disease, stage 3 unspecified: Secondary | ICD-10-CM | POA: Diagnosis not present

## 2019-11-10 DIAGNOSIS — I10 Essential (primary) hypertension: Secondary | ICD-10-CM | POA: Diagnosis not present

## 2019-11-10 DIAGNOSIS — I4891 Unspecified atrial fibrillation: Secondary | ICD-10-CM | POA: Diagnosis not present

## 2019-11-10 DIAGNOSIS — N184 Chronic kidney disease, stage 4 (severe): Secondary | ICD-10-CM | POA: Diagnosis not present

## 2019-11-10 DIAGNOSIS — I129 Hypertensive chronic kidney disease with stage 1 through stage 4 chronic kidney disease, or unspecified chronic kidney disease: Secondary | ICD-10-CM | POA: Diagnosis not present

## 2019-11-10 DIAGNOSIS — D500 Iron deficiency anemia secondary to blood loss (chronic): Secondary | ICD-10-CM | POA: Diagnosis not present

## 2019-11-10 DIAGNOSIS — D582 Other hemoglobinopathies: Secondary | ICD-10-CM | POA: Diagnosis not present

## 2019-12-05 ENCOUNTER — Other Ambulatory Visit: Payer: Self-pay | Admitting: Neurology

## 2019-12-10 ENCOUNTER — Encounter (HOSPITAL_COMMUNITY): Payer: Self-pay | Admitting: Emergency Medicine

## 2019-12-10 ENCOUNTER — Emergency Department (HOSPITAL_COMMUNITY)
Admission: EM | Admit: 2019-12-10 | Discharge: 2019-12-10 | Disposition: A | Discharge status: Home / Self Care | Payer: HMO | Attending: Emergency Medicine | Admitting: Emergency Medicine

## 2019-12-10 ENCOUNTER — Other Ambulatory Visit: Payer: Self-pay

## 2019-12-10 ENCOUNTER — Emergency Department (HOSPITAL_COMMUNITY): Payer: HMO

## 2019-12-10 DIAGNOSIS — M79671 Pain in right foot: Secondary | ICD-10-CM | POA: Insufficient documentation

## 2019-12-10 DIAGNOSIS — E1122 Type 2 diabetes mellitus with diabetic chronic kidney disease: Secondary | ICD-10-CM | POA: Diagnosis not present

## 2019-12-10 DIAGNOSIS — Z79899 Other long term (current) drug therapy: Secondary | ICD-10-CM | POA: Insufficient documentation

## 2019-12-10 DIAGNOSIS — E1165 Type 2 diabetes mellitus with hyperglycemia: Secondary | ICD-10-CM | POA: Insufficient documentation

## 2019-12-10 DIAGNOSIS — I878 Other specified disorders of veins: Secondary | ICD-10-CM | POA: Diagnosis not present

## 2019-12-10 DIAGNOSIS — Z7982 Long term (current) use of aspirin: Secondary | ICD-10-CM | POA: Diagnosis not present

## 2019-12-10 DIAGNOSIS — G459 Transient cerebral ischemic attack, unspecified: Secondary | ICD-10-CM | POA: Diagnosis not present

## 2019-12-10 DIAGNOSIS — M79672 Pain in left foot: Secondary | ICD-10-CM | POA: Diagnosis not present

## 2019-12-10 DIAGNOSIS — N184 Chronic kidney disease, stage 4 (severe): Secondary | ICD-10-CM | POA: Diagnosis not present

## 2019-12-10 DIAGNOSIS — R519 Headache, unspecified: Secondary | ICD-10-CM | POA: Insufficient documentation

## 2019-12-10 DIAGNOSIS — I129 Hypertensive chronic kidney disease with stage 1 through stage 4 chronic kidney disease, or unspecified chronic kidney disease: Secondary | ICD-10-CM | POA: Insufficient documentation

## 2019-12-10 DIAGNOSIS — R739 Hyperglycemia, unspecified: Secondary | ICD-10-CM

## 2019-12-10 DIAGNOSIS — Z87891 Personal history of nicotine dependence: Secondary | ICD-10-CM | POA: Insufficient documentation

## 2019-12-10 DIAGNOSIS — E1140 Type 2 diabetes mellitus with diabetic neuropathy, unspecified: Secondary | ICD-10-CM | POA: Insufficient documentation

## 2019-12-10 DIAGNOSIS — Z951 Presence of aortocoronary bypass graft: Secondary | ICD-10-CM | POA: Diagnosis not present

## 2019-12-10 LAB — COMPREHENSIVE METABOLIC PANEL
ALT: 25 U/L (ref 0–44)
AST: 16 U/L (ref 15–41)
Albumin: 4.1 g/dL (ref 3.5–5.0)
Alkaline Phosphatase: 80 U/L (ref 38–126)
Anion gap: 13 (ref 5–15)
BUN: 37 mg/dL — ABNORMAL HIGH (ref 8–23)
CO2: 29 mmol/L (ref 22–32)
Calcium: 10.8 mg/dL — ABNORMAL HIGH (ref 8.9–10.3)
Chloride: 98 mmol/L (ref 98–111)
Creatinine, Ser: 2.64 mg/dL — ABNORMAL HIGH (ref 0.61–1.24)
GFR calc Af Amer: 25 mL/min — ABNORMAL LOW (ref >60)
GFR calc non Af Amer: 21 mL/min — ABNORMAL LOW (ref >60)
Glucose, Bld: 133 mg/dL — ABNORMAL HIGH (ref 70–99)
Potassium: 3.3 mmol/L — ABNORMAL LOW (ref 3.5–5.1)
Sodium: 140 mmol/L (ref 135–145)
Total Bilirubin: 0.8 mg/dL (ref 0.3–1.2)
Total Protein: 7.4 g/dL (ref 6.5–8.1)

## 2019-12-10 LAB — URINALYSIS, ROUTINE W REFLEX MICROSCOPIC
Bacteria, UA: NONE SEEN
Bilirubin Urine: NEGATIVE
Glucose, UA: >=500 mg/dL — AB
Ketones, ur: NEGATIVE mg/dL
Leukocytes,Ua: NEGATIVE
Nitrite: NEGATIVE
Protein, ur: >=300 mg/dL — AB
Specific Gravity, Urine: 1.014 (ref 1.005–1.030)
pH: 6 (ref 5.0–8.0)

## 2019-12-10 LAB — CBC WITH DIFFERENTIAL/PLATELET
Abs Immature Granulocytes: 0.07 10*3/uL (ref 0.00–0.07)
Basophils Absolute: 0 10*3/uL (ref 0.0–0.1)
Basophils Relative: 0 %
Eosinophils Absolute: 0.1 10*3/uL (ref 0.0–0.5)
Eosinophils Relative: 1 %
HCT: 52.1 % — ABNORMAL HIGH (ref 39.0–52.0)
Hemoglobin: 16.9 g/dL (ref 13.0–17.0)
Immature Granulocytes: 1 %
Lymphocytes Relative: 8 %
Lymphs Abs: 0.9 10*3/uL (ref 0.7–4.0)
MCH: 28.8 pg (ref 26.0–34.0)
MCHC: 32.4 g/dL (ref 30.0–36.0)
MCV: 88.9 fL (ref 80.0–100.0)
Monocytes Absolute: 0.7 10*3/uL (ref 0.1–1.0)
Monocytes Relative: 6 %
Neutro Abs: 8.9 10*3/uL — ABNORMAL HIGH (ref 1.7–7.7)
Neutrophils Relative %: 84 %
Platelets: 162 10*3/uL (ref 150–400)
RBC: 5.86 MIL/uL — ABNORMAL HIGH (ref 4.22–5.81)
RDW: 14.3 % (ref 11.5–15.5)
WBC: 10.7 10*3/uL — ABNORMAL HIGH (ref 4.0–10.5)
nRBC: 0 % (ref 0.0–0.2)

## 2019-12-10 LAB — CBG MONITORING, ED
Glucose-Capillary: 225 mg/dL — ABNORMAL HIGH (ref 70–99)
Glucose-Capillary: 127 mg/dL — ABNORMAL HIGH (ref 70–99)

## 2019-12-10 MED ORDER — SODIUM CHLORIDE 0.9 % IV BOLUS
1000.0000 mL | Freq: Once | INTRAVENOUS | Status: AC
  Administered 2019-12-10: 1000 mL via INTRAVENOUS

## 2019-12-10 MED ORDER — ACETAMINOPHEN 500 MG PO TABS
1000.0000 mg | ORAL_TABLET | Freq: Once | ORAL | Status: AC
  Administered 2019-12-10: 1000 mg via ORAL
  Filled 2019-12-10: qty 2

## 2019-12-10 NOTE — ED Provider Notes (Signed)
[facility] DEPT Provider Note   CSN: [id number] Arrival date & time: 12/10/19  1247     History Chief Complaint  Patient presents with  . Hyperglycemia    [name] is a 86 y.o. male hx DM, HTN, here with hyperglycemia.  Patient states that he sometimes is very forgetful and occasionally forgets to take his diabetes medicine. His blood sugar has been running elevated around 400-500.  He also has some headaches as well.  He states that he has diabetic foot and his feet has been hurting as well.  Patient denies any fevers or chills or vomiting.  Patient states that his wife asked him to come to the ER to be evaluated for his hyperglycemia.  The history is provided by the patient.       Past Medical History:  Diagnosis Date  . Coronary artery disease   . Diabetes mellitus without complication ([place])   . Diabetic neuropathy ([place]) 09/16/2017   Mild bilateral foot drops  . Gait abnormality 09/16/2017  . Hyperlipidemia   . Hypertension   . Memory difficulty 03/06/2017  . Toxic goiter     Patient Active Problem List   Diagnosis Date Noted  . Diabetic neuropathy ([place]) 09/16/2017  . Gait abnormality 09/16/2017  . Memory difficulty 03/06/2017  . GIB (gastrointestinal bleeding) 03/31/2016  . Rectal bleeding   . Chronic kidney disease, stage IV (severe) ([place]) 03/23/2015  . CAD (coronary artery disease) of artery bypass graft 03/22/2014  . Essential hypertension 03/22/2014  . Hyperlipidemia 03/22/2014  . Multinodular goiter 03/22/2014  . 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 Abdominal aortic aneurysm ([place]) 03/04/2012    Past Surgical History:  Procedure Laterality Date  . ABDOMINAL AORTIC ANEURYSM REPAIR    . cardiac bypass    . CORONARY ARTERY BYPASS GRAFT         Family History  Problem Relation Age of Onset  . Stroke Mother   . Stroke Father   . Heart attack Father   . Diabetes  Sister   . Heart attack Sister   . Diabetes Brother   . Heart attack Brother   . Diabetes Brother   . Diabetes Brother   . Heart attack Brother     Social History   Tobacco Use  . Smoking status: Former Smoker    Packs/day: 1.00    Quit date: 03/31/1996    Years since quitting: 23.7  . Smokeless tobacco: Never Used  Substance Use Topics  . Alcohol use: No  . Drug use: No    Home Medications Prior to Admission medications   Medication Sig Start Date End Date Taking? Authorizing Provider  acetaminophen (TYLENOL) 325 MG tablet Take 650 mg by mouth every 6 (six) hours as needed for mild pain, fever or headache.   Yes [provider]  aspirin EC 81 MG tablet Take 81 mg by mouth daily.   Yes [provider]  atorvastatin (LIPITOR) 40 MG tablet Take 40 mg by mouth every evening.    Yes [provider]  calcitRIOL (ROCALTROL) 0.25 MCG capsule Take 0.5 mcg by mouth daily. 04/29/17  Yes [provider]  donepezil (ARICEPT) 10 MG tablet TAKE 1 TABLET BY MOUTH ONCE DAILY AT BEDTIME Patient taking differently: Take 10 mg by mouth at bedtime.  06/21/19  Yes Kathrynn Ducking, MD  furosemide (LASIX) 80 MG tablet Take 160 mg by mouth daily.  05/18/17  Yes [provider]  levothyroxine (SYNTHROID, LEVOTHROID) 75 MCG tablet Take 75 mcg by mouth daily before breakfast.   Yes [provider]  loratadine (CLARITIN) 10 MG tablet Take 10 mg by mouth daily as needed for allergies.    Yes [provider]  metoprolol succinate (TOPROL-XL) 25 MG 24 hr tablet Take 1 tablet by mouth once daily 05/19/19  Yes Belva Crome, MD  Multiple Vitamins-Minerals (MULTIVITAMIN ADULTS PO) Take 1 tablet by mouth daily.   Yes [provider]  niacin (NIASPAN) 1000 MG CR tablet Take 1,000 mg by mouth 2 (two) times daily.   Yes [provider]  NOVOLIN N RELION 100 UNIT/ML injection Inject into the skin 2 (two) times daily before a meal. Sliding  scale: 30-40 12/29/18  Yes [provider]  NOVOLIN R RELION 100 UNIT/ML injection Inject into the skin 3 (three) times daily before meals. Sliding scale: 30-40 12/29/18  Yes [provider]  potassium chloride (K-DUR) 10 MEQ tablet Take 10 mEq by mouth 2 (two) times daily with a meal. 09/25/18  Yes [provider]  sertraline (ZOLOFT) 50 MG tablet Take 50 mg by mouth daily.   Yes [provider]  [facility] VERIO test strip  01/12/19   [provider]    Allergies    Patient has no known allergies.  Review of Systems   Review of Systems  HENT:       Headache   Musculoskeletal:       Feet pain   All other systems reviewed and are negative.   Physical Exam Updated Vital Signs BP (!) 152/85 (BP Location: Right Arm)   Pulse 71   Temp 98 F (36.7 C) (Oral)   Resp 17   SpO2 99%   Physical Exam Vitals and nursing note reviewed.  Constitutional:      Appearance: Normal appearance.  HENT:     Head: Normocephalic.     Nose: Nose normal.     Mouth/Throat:     Mouth: Mucous membranes are moist.  Eyes:     Extraocular Movements: Extraocular movements intact.     Pupils: Pupils are equal, round, and reactive to light.  Cardiovascular:     Rate and Rhythm: Normal rate and regular rhythm.     Pulses: Normal pulses.     Heart sounds: Normal heart sounds.  Pulmonary:     Effort: Pulmonary effort is normal.     Breath sounds: Normal breath sounds.  Abdominal:     General: Abdomen is flat.     Palpations: Abdomen is soft.  Musculoskeletal:        General: Normal range of motion.     Cervical back: Normal range of motion.     Comments: Venous stasis changes. Nl DP pulses. No obvious cellulitis   Skin:    General: Skin is warm.     Capillary Refill: Capillary refill takes less than 2 seconds.  Neurological:     General: No focal deficit present.     Mental Status: He is alert and oriented to person, place, and time.     Comments: Slightly  confused, nl strength and sensation throughout, nl gait   Psychiatric:        Mood and Affect: Mood normal.        Behavior: Behavior normal.     ED Results / Procedures / Treatments   Labs (all labs ordered are listed, but only abnormal results are displayed) Labs Reviewed  URINALYSIS, ROUTINE W REFLEX  MICROSCOPIC - Abnormal; Notable for the following components:      Result Value   Glucose, UA >=500 (*)    Hgb urine dipstick SMALL (*)    Protein, ur >=300 (*)    All other components within normal limits  CBC WITH DIFFERENTIAL/PLATELET - Abnormal; Notable for the following components:   WBC 10.7 (*)    RBC 5.86 (*)    HCT 52.1 (*)    Neutro Abs 8.9 (*)    All other components within normal limits  COMPREHENSIVE METABOLIC PANEL - Abnormal; Notable for the following components:   Potassium 3.3 (*)    Glucose, Bld 133 (*)    BUN 37 (*)    Creatinine, Ser 2.64 (*)    Calcium 10.8 (*)    GFR calc non Af Amer 21 (*)    GFR calc Af Amer 25 (*)    All other components within normal limits  CBG MONITORING, ED - Abnormal; Notable for the following components:   Glucose-Capillary 225 (*)    All other components within normal limits  CBG MONITORING, ED - Abnormal; Notable for the following components:   Glucose-Capillary 127 (*)    All other components within normal limits  CBG MONITORING, ED    EKG None  Radiology CT Head Wo Contrast  Result Date: 12/10/2019 CLINICAL DATA:  Transient ischemic attack and weakness EXAM: CT HEAD WITHOUT CONTRAST TECHNIQUE: Contiguous axial images were obtained from the base of the skull through the vertex without intravenous contrast. COMPARISON:  None. FINDINGS: Brain: Mild atrophic changes and chronic white matter ischemic changes are noted. Lacunar infarcts are noted within the anterior aspect of the internal capsule on the right. No findings to suggest acute hemorrhage, acute infarction or space-occupying mass lesion are noted. Vascular: No  hyperdense vessel or unexpected calcification. Skull: Normal. Negative for fracture or focal lesion. Sinuses/Orbits: No acute finding. Other: None. IMPRESSION: Chronic atrophic and ischemic changes without acute abnormality. Electronically Signed   By: Inez Catalina M.D.   On: 12/10/2019 15:38    Procedures Procedures (including critical care time)  Medications Ordered in ED Medications  sodium chloride 0.9 % bolus 1,000 mL (1,000 mLs Intravenous New Bag/Given 12/10/19 1443)  acetaminophen (TYLENOL) tablet 1,000 mg (1,000 mg Oral Given 12/10/19 1443)    ED Course  I have reviewed the triage vital signs and the nursing notes.  Pertinent labs & imaging results that were available during my care of the patient were reviewed by me and considered in my medical decision making (see chart for details).    MDM Rules/Calculators/A&P                      [name] is a 86 y.o. male here with hyperglycemia. Glucose is 220 in the ED. Patient hasn't been compliant with his meds. Has headaches as well but nonfocal neuro exam. Has venous stasis changes in the legs. Will check labs, CT head, UA.   5:28 PM Glucose down to 127 with IVF. CT head and labs and UA unremarkable. Talk to wife and she will make sure that he takes his meds as prescribed. Stable for discharge   Final Clinical Impression(s) / ED Diagnoses Final diagnoses:  None    Rx / DC Orders ED Discharge Orders    None       Drenda Freeze, MD 12/10/19 1728

## 2019-12-10 NOTE — ED Triage Notes (Signed)
Per pt, states he is here for his diabetes-states he is having a headache and right lower leg pain-states his sugars have been running high-has'nt taking any DM meds since yesterday

## 2019-12-10 NOTE — Discharge Instructions (Addendum)
Take your insulin as prescribed   See your doctor   Return to ER if you have fever, vomiting, headaches, weakness.

## 2019-12-13 ENCOUNTER — Other Ambulatory Visit: Payer: Self-pay | Admitting: Neurology

## 2020-01-07 ENCOUNTER — Other Ambulatory Visit: Payer: Self-pay | Admitting: Neurology

## 2020-01-12 ENCOUNTER — Ambulatory Visit: Payer: HMO | Admitting: Neurology

## 2020-01-12 ENCOUNTER — Other Ambulatory Visit: Payer: Self-pay

## 2020-01-12 ENCOUNTER — Encounter: Payer: Self-pay | Admitting: Neurology

## 2020-01-12 VITALS — BP 149/70 | HR 64 | Temp 97.0°F | Ht 70.0 in | Wt 215.0 lb

## 2020-01-12 DIAGNOSIS — R269 Unspecified abnormalities of gait and mobility: Secondary | ICD-10-CM

## 2020-01-12 DIAGNOSIS — E1142 Type 2 diabetes mellitus with diabetic polyneuropathy: Secondary | ICD-10-CM | POA: Diagnosis not present

## 2020-01-12 DIAGNOSIS — E538 Deficiency of other specified B group vitamins: Secondary | ICD-10-CM | POA: Diagnosis not present

## 2020-01-12 DIAGNOSIS — M21372 Foot drop, left foot: Secondary | ICD-10-CM | POA: Diagnosis not present

## 2020-01-12 DIAGNOSIS — M21371 Foot drop, right foot: Secondary | ICD-10-CM | POA: Diagnosis not present

## 2020-01-12 HISTORY — DX: Foot drop, right foot: M21.371

## 2020-01-12 MED ORDER — DULOXETINE HCL 30 MG PO CPEP: 30.0000 mg | ORAL_CAPSULE | Freq: Every day | ORAL | 3 refills | Status: AC

## 2020-01-12 NOTE — Progress Notes (Signed)
Reason for visit: Diabetic peripheral neuropathy, gait disorder  Referring physician: [name] is a 86 y.o. male  History of present illness:  [name] is an 86 year old right-handed white male followed through this office previously for some troubles with a mild memory disorder.  The patient has a longstanding history of diabetes and obesity, he reports some numbness in the hands and feet, with a longstanding history of some gait instability.  The patient has had falls on occasion, he has been walking with a cane and he has a walker as well if he needs it.  He is no longer operating a motor vehicle.  He continues on Aricept for his memory, this has been relatively stable over time.  He denies any low back pain or pain down the legs, he denies any difficulty controlling the bowels but he does have urinary frequency.  He reports some sensory alteration up to the knee level bilaterally.  He does report some discomfort in the feet at night that makes it difficult for him to sleep.  He is sent to this office for further evaluation.  Past Medical History:  Diagnosis Date  . Coronary artery disease   . Diabetes mellitus without complication ([place])   . Diabetic neuropathy ([place]) 09/16/2017   Mild bilateral foot drops  . Gait abnormality 09/16/2017  . Hyperlipidemia   . Hypertension   . Memory difficulty 03/06/2017  . Toxic goiter     Past Surgical History:  Procedure Laterality Date  . ABDOMINAL AORTIC ANEURYSM REPAIR    . cardiac bypass    . CORONARY ARTERY BYPASS GRAFT      Family History  Problem Relation Age of Onset  . Stroke Mother   . Stroke Father   . Heart attack Father   . Diabetes Sister   . Heart attack Sister   . Diabetes Brother   . Heart attack Brother   . Diabetes Brother   . Diabetes Brother   . Heart attack Brother     Social history:  reports that he quit smoking about 23 years ago. He smoked 1.00 pack per day. He has never used smokeless  tobacco. He reports that he does not drink alcohol or use drugs.  Medications:  Prior to Admission medications   Medication Sig Start Date End Date Taking? Authorizing Provider  acetaminophen (TYLENOL) 325 MG tablet Take 650 mg by mouth every 6 (six) hours as needed for mild pain, fever or headache.   Yes [provider]  aspirin EC 81 MG tablet Take 81 mg by mouth daily.   Yes [provider]  atorvastatin (LIPITOR) 40 MG tablet Take 40 mg by mouth every evening.    Yes [provider]  calcitRIOL (ROCALTROL) 0.25 MCG capsule Take 0.5 mcg by mouth daily. 04/29/17  Yes [provider]  donepezil (ARICEPT) 10 MG tablet TAKE 1 TABLET BY MOUTH ONCE DAILY AT BEDTIME 01/10/20  Yes Kathrynn Ducking, MD  furosemide (LASIX) 80 MG tablet Take 160 mg by mouth daily.  05/18/17  Yes [provider]  levothyroxine (SYNTHROID, LEVOTHROID) 75 MCG tablet Take 75 mcg by mouth daily before breakfast.   Yes [provider]  loratadine (CLARITIN) 10 MG tablet Take 10 mg by mouth daily as needed for allergies.    Yes [provider]  metoprolol succinate (TOPROL-XL) 25 MG 24 hr tablet Take 1 tablet by mouth once daily 05/19/19  Yes Belva Crome, MD  Multiple  Vitamins-Minerals (MULTIVITAMIN ADULTS PO) Take 1 tablet by mouth daily.   Yes [provider]  niacin (NIASPAN) 1000 MG CR tablet Take 1,000 mg by mouth 2 (two) times daily.   Yes [provider]  NOVOLIN N RELION 100 UNIT/ML injection Inject into the skin 2 (two) times daily before a meal. Sliding scale: 30-40 12/29/18  Yes [provider]  NOVOLIN R RELION 100 UNIT/ML injection Inject into the skin 3 (three) times daily before meals. Sliding scale: 30-40 12/29/18  Yes [provider]  ONETOUCH VERIO test strip  01/12/19  Yes [provider]  potassium chloride (K-DUR) 10 MEQ tablet Take 10 mEq by mouth 2 (two) times daily with a meal. 09/25/18  Yes [provider]  [address] 1ML/31G 31G X 5/16" 1 ML MISC USE AS DIRECTED FIVE TIMES DAILY 12/10/19  Yes [provider]  sertraline (ZOLOFT) 50 MG tablet Take 50 mg by mouth daily.   Yes [provider]     No Known Allergies  ROS:  Out of a complete 14 system review of symptoms, the patient complains only of the following symptoms, and all other reviewed systems are negative.  Numbness Leg swelling Memory disorder Weakness  Blood pressure (!) 149/70, pulse 64, temperature (!) 97 F (36.1 C), height 5\' 10"  (1.778 m), weight 215 lb (97.5 kg).  Physical Exam  General: The patient is alert and cooperative at the time of the examination.  The patient is markedly obese.  Eyes: Pupils are equal, round, and reactive to light. Discs are flat bilaterally.  Neck: The neck is supple, no carotid bruits are noted.  Respiratory: The respiratory examination is clear.  Cardiovascular: The cardiovascular examination reveals a regular rate and rhythm, no obvious murmurs or rubs are noted.  Skin: Extremities are with 2-3+ edema below the knees bilaterally.  Neurologic Exam  Mental status: The patient is alert and oriented x 3 at the time of the examination. The patient has apparent normal recent and remote memory, with an apparently normal attention span and concentration ability.  Cranial nerves: Facial symmetry is present. There is good sensation of the face to pinprick and soft touch bilaterally. The strength of the facial muscles and the muscles to head turning and shoulder shrug are normal bilaterally. Speech is well enunciated, no aphasia or dysarthria is noted. Extraocular movements are full. Visual fields are full. The tongue is midline, and the patient has symmetric elevation of the soft palate. No obvious hearing deficits are noted.  Motor: The motor testing reveals 5 over 5 strength of all 4 extremities, with exception of mild bilateral foot drops. Good  symmetric motor tone is noted throughout.  Sensory: Sensory testing is intact to pinprick, soft touch, vibration sensation, and position sense on all 4 extremities.  There may be a slight stocking pattern pinprick sensory deficit up to the knees bilaterally.  No evidence of extinction is noted.  Coordination: Cerebellar testing reveals good finger-nose-finger and heel-to-shin bilaterally.  Gait and station: Gait is wide-based, unsteady.  The patient usually uses a cane for ambulation.  Tandem gait was not attempted.  Romberg is negative but slightly unsteady.  Reflexes: Deep tendon reflexes are symmetric, but are depressed bilaterally. Toes are downgoing bilaterally.   Assessment/Plan:  1.  Probable diabetic peripheral neuropathy  2.  Bilateral foot drop  3.  Gait disorder  4.  Mild memory disorder  The patient will be set up for blood work today.  He will be  given a prescription for low-dose Cymbalta to take in the evening for his neuropathy discomfort.  He has bilateral foot drops which are adding to the gait instability.  He will follow-up here in 4 to 5 months, he could potentially benefit from physical therapy for gait training in the future.  Roy Alexanders MD 01/12/2020 3:45 PM  [facility] [address] [place], [place] [address]  Phone [phone] Fax [phone]

## 2020-01-12 NOTE — Patient Instructions (Signed)
We will start Cymbalta in the evening for the neuropathy discomfort.  Cymbalta (duloxetine) is an antidepressant medication that is commonly used for peripheral neuropathy pain or for fibromyalgia pain. As with any antidepressant medication, worsening depression can be seen. This medication can potentially cause headache, dizziness, sexual dysfunction, or nausea. If any problems are noted on this medication, please contact our office.

## 2020-01-13 DIAGNOSIS — D582 Other hemoglobinopathies: Secondary | ICD-10-CM | POA: Diagnosis not present

## 2020-01-13 DIAGNOSIS — E782 Mixed hyperlipidemia: Secondary | ICD-10-CM | POA: Diagnosis not present

## 2020-01-13 DIAGNOSIS — I4891 Unspecified atrial fibrillation: Secondary | ICD-10-CM | POA: Diagnosis not present

## 2020-01-13 DIAGNOSIS — D500 Iron deficiency anemia secondary to blood loss (chronic): Secondary | ICD-10-CM | POA: Diagnosis not present

## 2020-01-13 DIAGNOSIS — I10 Essential (primary) hypertension: Secondary | ICD-10-CM | POA: Diagnosis not present

## 2020-01-13 DIAGNOSIS — M1610 Unilateral primary osteoarthritis, unspecified hip: Secondary | ICD-10-CM | POA: Diagnosis not present

## 2020-01-13 DIAGNOSIS — N183 Chronic kidney disease, stage 3 unspecified: Secondary | ICD-10-CM | POA: Diagnosis not present

## 2020-01-13 DIAGNOSIS — I25119 Atherosclerotic heart disease of native coronary artery with unspecified angina pectoris: Secondary | ICD-10-CM | POA: Diagnosis not present

## 2020-01-13 DIAGNOSIS — E1122 Type 2 diabetes mellitus with diabetic chronic kidney disease: Secondary | ICD-10-CM | POA: Diagnosis not present

## 2020-01-14 LAB — ANA W/REFLEX: Anti Nuclear Antibody (ANA): NEGATIVE

## 2020-01-14 LAB — MULTIPLE MYELOMA PANEL, SERUM
Albumin SerPl Elph-Mcnc: 3.2 g/dL (ref 2.9–4.4)
Albumin/Glob SerPl: 1.3 (ref 0.7–1.7)
Alpha 1: 0.2 g/dL (ref 0.0–0.4)
Alpha2 Glob SerPl Elph-Mcnc: 0.8 g/dL (ref 0.4–1.0)
B-Globulin SerPl Elph-Mcnc: 0.9 g/dL (ref 0.7–1.3)
Gamma Glob SerPl Elph-Mcnc: 0.7 g/dL (ref 0.4–1.8)
Globulin, Total: 2.6 g/dL (ref 2.2–3.9)
IgA/Immunoglobulin A, Serum: 151 mg/dL (ref 61–437)
IgG (Immunoglobin G), Serum: 566 mg/dL — ABNORMAL LOW (ref 603–1613)
IgM (Immunoglobulin M), Srm: 135 mg/dL (ref 15–143)
Total Protein: 5.8 g/dL — ABNORMAL LOW (ref 6.0–8.5)

## 2020-01-14 LAB — VITAMIN B12: Vitamin B-12: 1333 pg/mL — ABNORMAL HIGH (ref 232–1245)

## 2020-01-14 LAB — SEDIMENTATION RATE: Sed Rate: 4 mm/hr (ref 0–30)

## 2020-01-14 LAB — ANGIOTENSIN CONVERTING ENZYME: Angio Convert Enzyme: 45 U/L (ref 14–82)

## 2020-01-14 LAB — B. BURGDORFI ANTIBODIES: Lyme IgG/IgM Ab: <0.91 {ISR} (ref 0.00–0.90)

## 2020-01-28 ENCOUNTER — Other Ambulatory Visit: Payer: Self-pay

## 2020-01-28 NOTE — Patient Outreach (Signed)
  [facility]) Care Management Chronic Special Needs Program  01/28/2020  Name: TILMAN MCCLAREN DOB: 1934-08-21  MRN: [id number]  Mr. Roscoe Witts is enrolled in a chronic special needs plan for Diabetes. Client called with no answer No answer and HIPAA compliant message left. 1st attempt Plan for 2nd outreach call in one week Chronic care management coordinator will attempt outreach in one week.   Peter Garter RN, Jackquline Denmark, CDE Chronic Care Management Coordinator [place] [facility] Care Management [phone]

## 2020-02-02 ENCOUNTER — Ambulatory Visit: Payer: Self-pay | Admitting: Pharmacist

## 2020-02-02 ENCOUNTER — Other Ambulatory Visit: Payer: Self-pay | Admitting: Pharmacist

## 2020-02-02 NOTE — Patient Outreach (Signed)
[place] [facility]) Care Management  02/02/2020  [name] [birth year] [id number]  Patient was called regarding CSNP med review. Unfortunately, he did not answer his phone. HIPAA compliant messages were left on both of his voicemails (home and mobile)  Plan: Patient's pharmacy case will be closed as he has been called multiple times unsuccessfully.  Elayne Guerin, PharmD, [place] Clinical Pharmacist [phone]

## 2020-02-04 ENCOUNTER — Other Ambulatory Visit: Payer: Self-pay

## 2020-02-04 NOTE — Patient Outreach (Signed)
  [facility]) Care Management Chronic Special Needs Program  02/04/2020  Name: [name] DOB: [birth year]  MRN: [id number]  [name] is enrolled in a chronic special needs plan for Diabetes. Client called with no answer No answer and HIPAA compliant message left. 2nd attempt Plan for 3rd outreach call in one week Chronic care management coordinator will attempt outreach in one week.   Peter Garter RN, Jackquline Denmark, CDE Chronic Care Management Coordinator [place] [facility] [phone]

## 2020-02-07 ENCOUNTER — Ambulatory Visit: Payer: HMO

## 2020-02-08 NOTE — Progress Notes (Deleted)
Cardiology Office Note:    Date:  02/08/2020   ID:  [name], DOB 12/29/1933, MRN [id number]  PCP:  Hulan Fess, MD  Cardiologist:  Sinclair Grooms, MD   Referring MD: Hulan Fess, MD   No chief complaint on file.   History of Present Illness:    [name] is a 86 y.o. male with a hx of coronary artery disease, abdominal aortic aneurysm, hypertension, review his coronary bypass grafting, and recent lower GI bleeding possibly from diverticulosis.  ***  Past Medical History:  Diagnosis Date  . Bilateral foot-drop 01/12/2020  . Coronary artery disease   . Diabetes mellitus without complication ([place])   . Diabetic neuropathy ([place]) 09/16/2017   Mild bilateral foot drops  . Gait abnormality 09/16/2017  . Hyperlipidemia   . Hypertension   . Memory difficulty 03/06/2017  . Toxic goiter     Past Surgical History:  Procedure Laterality Date  . ABDOMINAL AORTIC ANEURYSM REPAIR    . cardiac bypass    . CORONARY ARTERY BYPASS GRAFT      Current Medications: No outpatient medications have been marked as taking for the 02/09/20 encounter (Appointment) with Belva Crome, MD.     Allergies:   Patient has no known allergies.   Social History   Socioeconomic History  . Marital status: Married    Spouse name: [name]  . Number of children: 3  . Years of education: 1  . Highest education level: Not on file  Occupational History  . Not on file  Tobacco Use  . Smoking status: Former Smoker    Packs/day: 1.00    Quit date: 03/31/1996    Years since quitting: 23.8  . Smokeless tobacco: Never Used  Substance and Sexual Activity  . Alcohol use: No  . Drug use: No  . Sexual activity: Yes    Birth control/protection: None  Other Topics Concern  . Not on file  Social History Narrative   Lives  With spouse   Caffeine use:  Coffee ocass   Tea/soda daily   Right-handed   Social Determinants of Health   Financial Resource Strain:   . Difficulty of Paying Living  Expenses: Not on file  Food Insecurity: No Food Insecurity  . Worried About Charity fundraiser in the Last Year: Never true  . Ran Out of Food in the Last Year: Never true  Transportation Needs: No Transportation Needs  . Lack of Transportation (Medical): No  . Lack of Transportation (Non-Medical): No  Physical Activity:   . Days of Exercise per Week: Not on file  . Minutes of Exercise per Session: Not on file  Stress:   . Feeling of Stress : Not on file  Social Connections:   . Frequency of Communication with Friends and Family: Not on file  . Frequency of Social Gatherings with Friends and Family: Not on file  . Attends Religious Services: Not on file  . Active Member of Clubs or Organizations: Not on file  . Attends Archivist Meetings: Not on file  . Marital Status: Not on file     Family History: [name] includes Diabetes in his brother, brother, brother, and sister; Heart attack in his brother, brother, father, and sister; Stroke in his father and mother.  ROS:   Please see the history of present illness.    *** All other systems reviewed and are negative.  EKGs/Labs/Other Studies Reviewed:    The following studies  were reviewed today: ***  EKG:  EKG ***  Recent Labs: 12/10/2019: ALT 25; BUN 37; Creatinine, Ser 2.64; Hemoglobin 16.9; Platelets 162; Potassium 3.3; Sodium 140  Recent Lipid Panel    Component Value Date/Time   CHOL 93 03/31/2016 0306   TRIG 102 03/31/2016 0306   HDL 35 (L) 03/31/2016 0306   CHOLHDL 2.7 03/31/2016 0306   VLDL 20 03/31/2016 0306   LDLCALC 38 03/31/2016 0306    Physical Exam:    VS:  There were no vitals taken for this visit.    Wt Readings from Last 3 Encounters:  01/12/20 215 lb (97.5 kg)  02/09/19 233 lb 1.9 oz (105.7 kg)  09/22/18 241 lb (109.3 kg)     GEN: ***. No acute distress HEENT: Normal NECK: No JVD. LYMPHATICS: No lymphadenopathy CARDIAC: *** RRR without murmur, gallop, or  edema. VASCULAR: *** Normal Pulses. No bruits. RESPIRATORY:  Clear to auscultation without rales, wheezing or rhonchi  ABDOMEN: Soft, non-tender, non-distended, No pulsatile mass, MUSCULOSKELETAL: No deformity  SKIN: Warm and dry NEUROLOGIC:  Alert and oriented x 3 PSYCHIATRIC:  Normal affect   ASSESSMENT:    1. Coronary artery disease involving native coronary artery of native heart without angina pectoris   2. Essential hypertension   3. Abdominal aortic aneurysm (AAA) without rupture ([place])   4. Diabetes mellitus due to underlying condition with diabetic nephropathy, with long-term current use of insulin ([place])   5. Hyperlipidemia, unspecified hyperlipidemia type   6. Chronic kidney disease, stage IV (severe) ([place])   7. Educated about COVID-19 virus infection    PLAN:    In order of problems listed above:  1. ***   Medication Adjustments/Labs and Tests Ordered: Current medicines are reviewed at length with the patient today.  Concerns regarding medicines are outlined above.  No orders of the defined types were placed in this encounter.  No orders of the defined types were placed in this encounter.   There are no Patient Instructions on file for this visit.   Signed, Sinclair Grooms, MD  02/08/2020 8:48 PM    [place]

## 2020-02-09 ENCOUNTER — Ambulatory Visit: Payer: HMO | Admitting: Interventional Cardiology

## 2020-02-11 ENCOUNTER — Ambulatory Visit: Payer: HMO

## 2020-02-11 ENCOUNTER — Other Ambulatory Visit: Payer: Self-pay

## 2020-02-11 NOTE — Patient Outreach (Signed)
[facility]) Care Management Chronic Special Needs Program  02/11/2020  Name: [name] DOB: 1934-08-31  MRN: [id number]  [name] is enrolled in a chronic special needs plan for  Diabetes.Client called with no answer. HIPAA compliant message left.  3rd attempt  A completed health risk assessment has not been received from the client and client has not responded to 3 outreach attempts by [facility] to completed 2021 health risk assessment  The client's individualized care plan was developed based on available data and previous 2020 health risk assessment .  Goals Addressed            This Visit's Progress   . Client understands the importance of follow-up with providers by attending scheduled visits   On track    Plan to keep scheduled appointments with providers    . Client will report no worsening of symptoms related to heart disease within the next 6 months    On track    No ED visits or hospitalizations for heart disease in 2020 Call 911 for severe symptoms Follow a low salt, heart healthy diet Sent EMMI: Heart Disease in Diabetics    . Client will use Assistive Devices as needed and verbalize understanding of device use   On track    [employer] approved meters-One Touch, Freestyle or Precision     . Client will verbalize knowledge of self management of Hypertension as evidences by BP reading of 140/90 or less; or as defined by provider   On track    Plan to check B/P regularly Take B/P medications as ordered Plan to follow a low salt diet  Increase activity as tolerated Sent EMMI-High Blood Pressure(Hypertension): What you can do     . Client/Caregiver will verbalize understanding of instructions related to self-care and safety       Sent EMMI: Mild cognitive impairment    . HEMOGLOBIN A1C < 7.0       Last Hemoglobin A1C 7.5% 07/19/19 Plan to check blood sugars as directed with goals fasting or 1 1/2 hours after eating with goal of 80-130  fasting and 180 or less after meals Plan to follow a low carbohydrate, low salt diet, watch portion sizes and avoid sugar sweetened drinks Plan to take your insulin as ordered    . Maintain timely refills of diabetic medication as prescribed within the year .   On track    Maintaining timely refills of medications per dispense report It is important to get your medications refilled on time    . Obtain annual  Lipid Profile, LDL-C   On track    Last completed 10/22/19 LDL 61 The goal for LDL is less than 70 mg/dL as you are at high risk for complications Try to avoid saturated fats, trans-fats and eat more fiber Plan to take statin as ordered    . Obtain Annual Eye (retinal)  Exam    On track    Plan to have a dilated eye exam every year     . Obtain Annual Foot Exam   On track    Last completed 04/21/19 Check your skin and feet every day for cuts, bruises, redness, blisters, or sores. Schedule a foot exam with your health care provider once every year    . Obtain annual screen for micro albuminuria (urine) , nephropathy (kidney problems)   On track    It is important for your doctor to check your urine for protein at least every  year    . Obtain Hemoglobin A1C at least 2 times per year   On track    Last completed 07/19/19 It is important to have your Hemoglobin A1C checked every 6 months if you are at goal and every 3 months if you are not at goal    . Visit Primary Care Provider or Endocrinologist at least 2 times per year    On track    Last Primary care visit 10/22/19 Please schedule your annual wellness visit      Plan:  . Send unsuccessful outreach letter with a copy of individualized care plan to client . Send individualized care plan to provider . Educational Materials EMMI-High Blood Pressure(Hypertension): What you can do, Heart Disease in diabetics, Mild cognitive impairment  Chronic care management coordinator will attempt outreach in 3 months.   Peter Garter  RN, Jackquline Denmark, CDE Chronic Care Management Coordinator [facility] Care Management [phone]

## 2020-02-28 ENCOUNTER — Ambulatory Visit: Payer: HMO

## 2020-03-17 ENCOUNTER — Other Ambulatory Visit: Payer: Self-pay | Admitting: Interventional Cardiology

## 2020-03-18 ENCOUNTER — Emergency Department (HOSPITAL_COMMUNITY): Payer: HMO

## 2020-03-18 ENCOUNTER — Emergency Department (HOSPITAL_COMMUNITY)
Admission: EM | Admit: 2020-03-18 | Discharge: 2020-03-18 | Disposition: A | Discharge status: Home / Self Care | Payer: HMO | Attending: Emergency Medicine | Admitting: Emergency Medicine

## 2020-03-18 ENCOUNTER — Other Ambulatory Visit: Payer: Self-pay

## 2020-03-18 DIAGNOSIS — W01198A Fall on same level from slipping, tripping and stumbling with subsequent striking against other object, initial encounter: Secondary | ICD-10-CM | POA: Diagnosis not present

## 2020-03-18 DIAGNOSIS — N184 Chronic kidney disease, stage 4 (severe): Secondary | ICD-10-CM | POA: Diagnosis not present

## 2020-03-18 DIAGNOSIS — S80212A Abrasion, left knee, initial encounter: Secondary | ICD-10-CM | POA: Diagnosis not present

## 2020-03-18 DIAGNOSIS — Z7982 Long term (current) use of aspirin: Secondary | ICD-10-CM | POA: Insufficient documentation

## 2020-03-18 DIAGNOSIS — Y999 Unspecified external cause status: Secondary | ICD-10-CM | POA: Diagnosis not present

## 2020-03-18 DIAGNOSIS — S199XXA Unspecified injury of neck, initial encounter: Secondary | ICD-10-CM | POA: Diagnosis not present

## 2020-03-18 DIAGNOSIS — E1122 Type 2 diabetes mellitus with diabetic chronic kidney disease: Secondary | ICD-10-CM | POA: Diagnosis not present

## 2020-03-18 DIAGNOSIS — S0083XA Contusion of other part of head, initial encounter: Secondary | ICD-10-CM | POA: Diagnosis not present

## 2020-03-18 DIAGNOSIS — Z87891 Personal history of nicotine dependence: Secondary | ICD-10-CM | POA: Insufficient documentation

## 2020-03-18 DIAGNOSIS — Z794 Long term (current) use of insulin: Secondary | ICD-10-CM | POA: Insufficient documentation

## 2020-03-18 DIAGNOSIS — I129 Hypertensive chronic kidney disease with stage 1 through stage 4 chronic kidney disease, or unspecified chronic kidney disease: Secondary | ICD-10-CM | POA: Insufficient documentation

## 2020-03-18 DIAGNOSIS — Z79899 Other long term (current) drug therapy: Secondary | ICD-10-CM | POA: Diagnosis not present

## 2020-03-18 DIAGNOSIS — T148XXA Other injury of unspecified body region, initial encounter: Secondary | ICD-10-CM

## 2020-03-18 DIAGNOSIS — E1165 Type 2 diabetes mellitus with hyperglycemia: Secondary | ICD-10-CM | POA: Diagnosis not present

## 2020-03-18 DIAGNOSIS — S0990XA Unspecified injury of head, initial encounter: Secondary | ICD-10-CM | POA: Diagnosis present

## 2020-03-18 DIAGNOSIS — Y9389 Activity, other specified: Secondary | ICD-10-CM | POA: Insufficient documentation

## 2020-03-18 DIAGNOSIS — W19XXXA Unspecified fall, initial encounter: Secondary | ICD-10-CM | POA: Diagnosis not present

## 2020-03-18 DIAGNOSIS — E1140 Type 2 diabetes mellitus with diabetic neuropathy, unspecified: Secondary | ICD-10-CM | POA: Insufficient documentation

## 2020-03-18 DIAGNOSIS — Y9289 Other specified places as the place of occurrence of the external cause: Secondary | ICD-10-CM | POA: Insufficient documentation

## 2020-03-18 DIAGNOSIS — I10 Essential (primary) hypertension: Secondary | ICD-10-CM | POA: Diagnosis not present

## 2020-03-18 DIAGNOSIS — S0081XA Abrasion of other part of head, initial encounter: Secondary | ICD-10-CM | POA: Diagnosis not present

## 2020-03-18 DIAGNOSIS — R609 Edema, unspecified: Secondary | ICD-10-CM | POA: Diagnosis not present

## 2020-03-18 DIAGNOSIS — S0993XA Unspecified injury of face, initial encounter: Secondary | ICD-10-CM | POA: Diagnosis not present

## 2020-03-18 DIAGNOSIS — S0003XA Contusion of scalp, initial encounter: Secondary | ICD-10-CM | POA: Diagnosis not present

## 2020-03-18 NOTE — ED Triage Notes (Signed)
Walked outside to his car with cane tripped and fell on concrete, bruising to left orbital, hematoma to left forehead, small abrasions to both knee. A/O x4

## 2020-03-18 NOTE — ED Provider Notes (Signed)
[facility] [place] [facility] DEPT Provider Note   CSN: [id number] Arrival date & time: 03/18/20  3007     History Chief Complaint  Patient presents with  . Fall    [name] is a 86 y.o. male.  Patient presents to the emergency department for evaluation after a fall.  He reports that he was walking and lost his balance, tripped and fell.  He hit the left side of his face.  He has a headache and facial pain.  No vision change or double vision.  He has an abrasion on his left knee but no knee pain.  No other injury.        Past Medical History:  Diagnosis Date  . Bilateral foot-drop 01/12/2020  . Coronary artery disease   . Diabetes mellitus without complication ([place])   . Diabetic neuropathy ([place]) 09/16/2017   Mild bilateral foot drops  . Gait abnormality 09/16/2017  . Hyperlipidemia   . Hypertension   . Memory difficulty 03/06/2017  . Toxic goiter     Patient Active Problem List   Diagnosis Date Noted  . Bilateral foot-drop 01/12/2020  . Diabetic neuropathy ([place]) 09/16/2017  . Gait abnormality 09/16/2017  . Memory difficulty 03/06/2017  . GIB (gastrointestinal bleeding) 03/31/2016  . Rectal bleeding   . Chronic kidney disease, stage IV (severe) ([place]) 03/23/2015  . CAD (coronary artery disease) of artery bypass graft 03/22/2014  . Essential hypertension 03/22/2014  . Hyperlipidemia 03/22/2014  . Multinodular goiter 03/22/2014  . Diabetes mellitus due to underlying condition with diabetic nephropathy ([place]) 03/22/2014  . Abdominal aortic aneurysm ([place]) 03/04/2012    Past Surgical History:  Procedure Laterality Date  . ABDOMINAL AORTIC ANEURYSM REPAIR    . cardiac bypass    . CORONARY ARTERY BYPASS GRAFT         Family History  Problem Relation Age of Onset  . Stroke Mother   . Stroke Father   . Heart attack Father   . Diabetes Sister   . Heart attack Sister   . Diabetes Brother   . Heart attack Brother   . Diabetes Brother   . Diabetes  Brother   . Heart attack Brother     Social History   Tobacco Use  . Smoking status: Former Smoker    Packs/day: 1.00    Quit date: 03/31/1996    Years since quitting: 23.9  . Smokeless tobacco: Never Used  Substance Use Topics  . Alcohol use: No  . Drug use: No    Home Medications Prior to Admission medications   Medication Sig Start Date End Date Taking? Authorizing Provider  acetaminophen (TYLENOL) 325 MG tablet Take 650 mg by mouth every 6 (six) hours as needed for mild pain, fever or headache.    [provider]  aspirin EC 81 MG tablet Take 81 mg by mouth daily.    [provider]  atorvastatin (LIPITOR) 40 MG tablet Take 40 mg by mouth every evening.     [provider]  calcitRIOL (ROCALTROL) 0.25 MCG capsule Take 0.5 mcg by mouth daily. 04/29/17   [provider]  donepezil (ARICEPT) 10 MG tablet TAKE 1 TABLET BY MOUTH ONCE DAILY AT BEDTIME 01/10/20   Kathrynn Ducking, MD  DULoxetine (CYMBALTA) 30 MG capsule Take 1 capsule (30 mg total) by mouth at bedtime. 01/12/20   Kathrynn Ducking, MD  furosemide (LASIX) 80 MG tablet Take 160 mg by mouth daily.  05/18/17   [provider]  levothyroxine (SYNTHROID, LEVOTHROID) 75 MCG tablet Take 75 mcg by mouth daily before breakfast.    [provider]  loratadine (CLARITIN) 10 MG tablet Take 10 mg by mouth daily as needed for allergies.     [provider]  metoprolol succinate (TOPROL-XL) 25 MG 24 hr tablet Take 1 tablet by mouth once daily 05/19/19   Belva Crome, MD  Multiple Vitamins-Minerals (MULTIVITAMIN ADULTS PO) Take 1 tablet by mouth daily.    [provider]  niacin (NIASPAN) 1000 MG CR tablet Take 1,000 mg by mouth 2 (two) times daily.    [provider]  NOVOLIN N RELION 100 UNIT/ML injection Inject into the skin 2 (two) times daily before a meal. Sliding scale: 30-40 12/29/18   [provider]  NOVOLIN R RELION 100 UNIT/ML injection  Inject into the skin 3 (three) times daily before meals. Sliding scale: 30-40 12/29/18   [provider]  [facility] VERIO test strip  01/12/19   [provider]  potassium chloride (K-DUR) 10 MEQ tablet Take 10 mEq by mouth 2 (two) times daily with a meal. 09/25/18   [provider]  [place] 1ML/31G 31G X 5/16" 1 ML MISC USE AS DIRECTED FIVE TIMES DAILY 12/10/19   [provider]  sertraline (ZOLOFT) 50 MG tablet Take 50 mg by mouth daily.    [provider]    Allergies    Patient has no known allergies.  Review of Systems   Review of Systems  Skin: Positive for wound.  Neurological: Positive for headaches.  All other systems reviewed and are negative.   Physical Exam Updated Vital Signs BP (!) 158/84 (BP Location: Left Arm)   Pulse 78   Temp 98.9 F (37.2 C) (Oral)   Resp 16   Ht 5\' 10"  (1.778 m)   Wt 100.2 kg   SpO2 98%   BMI 31.71 kg/m   Physical Exam Vitals and nursing note reviewed.  Constitutional:      General: He is not in acute distress.    Appearance: Normal appearance. He is well-developed.  HENT:     Head: Normocephalic. Abrasion and contusion (Left side of face) present.      Right Ear: Hearing normal.     Left Ear: Hearing normal.     Nose: Nose normal.  Eyes:     Conjunctiva/sclera: Conjunctivae normal.     Pupils: Pupils are equal, round, and reactive to light.  Cardiovascular:     Rate and Rhythm: Regular rhythm.     Heart sounds: S1 normal and S2 normal. No murmur. No friction rub. No gallop.   Pulmonary:     Effort: Pulmonary effort is normal. No respiratory distress.     Breath sounds: Normal breath sounds.  Chest:     Chest wall: No tenderness.  Abdominal:     General: Bowel sounds are normal.     Palpations: Abdomen is soft.     Tenderness: There is no abdominal tenderness. There is no guarding or rebound. Negative signs include Murphy's sign and McBurney's sign.     Hernia: No hernia  is present.  Musculoskeletal:        General: Normal range of motion.     Cervical back: Normal range of motion and neck supple.     Right hip: Normal.     Left hip: Normal.     Left knee: No swelling, deformity or effusion. Normal range of motion.  Skin:    General:  Skin is warm and dry.     Findings: Abrasion (Left knee) present. No rash.  Neurological:     Mental Status: He is alert and oriented to person, place, and time.     GCS: GCS eye subscore is 4. GCS verbal subscore is 5. GCS motor subscore is 6.     Cranial Nerves: No cranial nerve deficit.     Sensory: No sensory deficit.     Coordination: Coordination normal.  Psychiatric:        Speech: Speech normal.        Behavior: Behavior normal.        Thought Content: Thought content normal.     ED Results / Procedures / Treatments   Labs (all labs ordered are listed, but only abnormal results are displayed) Labs Reviewed - No data to display  EKG None  Radiology CT HEAD WO CONTRAST  Result Date: 03/18/2020 CLINICAL DATA:  Jama Flavors outside to his car with cane tripped and fell on concrete, bruising to left orbital, hematoma to left forehead, small abrasions to both knee. A/O x4 Facial trauma; Neck trauma, blunt EXAM: CT HEAD WITHOUT CONTRAST CT MAXILLOFACIAL WITHOUT CONTRAST CT CERVICAL SPINE WITHOUT CONTRAST TECHNIQUE: Multidetector CT imaging of the head, cervical spine, and maxillofacial structures were performed using the standard protocol without intravenous contrast. Multiplanar CT image reconstructions of the cervical spine and maxillofacial structures were also generated. COMPARISON:  12/10/1999 FINDINGS: CT HEAD FINDINGS Brain: No acute intracranial hemorrhage. No focal mass lesion. No CT evidence of acute infarction. No midline shift or mass effect. No hydrocephalus. Basilar cisterns are patent. There are periventricular and subcortical white matter hypodensities. Generalized cortical atrophy. Vascular: No hyperdense  vessel or unexpected calcification. Skull: Normal. Negative for fracture or focal lesion. Sinuses/Orbits: Paranasal sinuses and mastoid air cells are clear. Orbits are clear. Other: Scalp hematoma over the anterior LEFT frontal bone. No associated fracture. CT MAXILLOFACIAL FINDINGS Osseous: Orbital rims are intact. And zygomatic arches are normal. No maxillary sinus wall fracture. Pterygoid plates intact. Mandibular condyles located. No mandibular body fracture. Orbits: Globes are normal.  No proptosis Sinuses: No fluid in the paranasal sinuses Soft tissues: Negative. CT CERVICAL SPINE FINDINGS Alignment: Normal alignment of the cervical vertebral bodies. Skull base and vertebrae: Normal craniocervical junction. No loss of vertebral body height or disc height. Normal facet articulation. No evidence of fracture. Soft tissues and spinal canal: No prevertebral soft tissue swelling. No perispinal or epidural hematoma. Disc levels: Multiple levels of endplate spurring and disc space narrowing. Disc space narrowing most severe at C5-C6 and C6/C7 with endplate sclerosis. Upper chest: Clear Other: Nodular extension of the LEFT lobe of thyroid gland measuring 2.7 cm favored benign nodular goiter. IMPRESSION: 1. No intracranial trauma. 2. LEFT scalp hematoma.  No skull fracture . 3. No facial bone fracture. 4. No cervical spine fracture. Electronically Signed   By: Suzy Bouchard M.D.   On: 03/18/2020 04:14   CT CERVICAL SPINE WO CONTRAST  Result Date: 03/18/2020 CLINICAL DATA:  Jama Flavors outside to his car with cane tripped and fell on concrete, bruising to left orbital, hematoma to left forehead, small abrasions to both knee. A/O x4 Facial trauma; Neck trauma, blunt EXAM: CT HEAD WITHOUT CONTRAST CT MAXILLOFACIAL WITHOUT CONTRAST CT CERVICAL SPINE WITHOUT CONTRAST TECHNIQUE: Multidetector CT imaging of the head, cervical spine, and maxillofacial structures were performed using the standard protocol without intravenous  contrast. Multiplanar CT image reconstructions of the cervical spine and maxillofacial structures were also generated. COMPARISON:  12/10/1999 FINDINGS: CT HEAD FINDINGS Brain: No acute intracranial hemorrhage. No focal mass lesion. No CT evidence of acute infarction. No midline shift or mass effect. No hydrocephalus. Basilar cisterns are patent. There are periventricular and subcortical white matter hypodensities. Generalized cortical atrophy. Vascular: No hyperdense vessel or unexpected calcification. Skull: Normal. Negative for fracture or focal lesion. Sinuses/Orbits: Paranasal sinuses and mastoid air cells are clear. Orbits are clear. Other: Scalp hematoma over the anterior LEFT frontal bone. No associated fracture. CT MAXILLOFACIAL FINDINGS Osseous: Orbital rims are intact. And zygomatic arches are normal. No maxillary sinus wall fracture. Pterygoid plates intact. Mandibular condyles located. No mandibular body fracture. Orbits: Globes are normal.  No proptosis Sinuses: No fluid in the paranasal sinuses Soft tissues: Negative. CT CERVICAL SPINE FINDINGS Alignment: Normal alignment of the cervical vertebral bodies. Skull base and vertebrae: Normal craniocervical junction. No loss of vertebral body height or disc height. Normal facet articulation. No evidence of fracture. Soft tissues and spinal canal: No prevertebral soft tissue swelling. No perispinal or epidural hematoma. Disc levels: Multiple levels of endplate spurring and disc space narrowing. Disc space narrowing most severe at C5-C6 and C6/C7 with endplate sclerosis. Upper chest: Clear Other: Nodular extension of the LEFT lobe of thyroid gland measuring 2.7 cm favored benign nodular goiter. IMPRESSION: 1. No intracranial trauma. 2. LEFT scalp hematoma.  No skull fracture . 3. No facial bone fracture. 4. No cervical spine fracture. Electronically Signed   By: Suzy Bouchard M.D.   On: 03/18/2020 04:14   CT MAXILLOFACIAL WO CONTRAST  Result Date:  03/18/2020 CLINICAL DATA:  Jama Flavors outside to his car with cane tripped and fell on concrete, bruising to left orbital, hematoma to left forehead, small abrasions to both knee. A/O x4 Facial trauma; Neck trauma, blunt EXAM: CT HEAD WITHOUT CONTRAST CT MAXILLOFACIAL WITHOUT CONTRAST CT CERVICAL SPINE WITHOUT CONTRAST TECHNIQUE: Multidetector CT imaging of the head, cervical spine, and maxillofacial structures were performed using the standard protocol without intravenous contrast. Multiplanar CT image reconstructions of the cervical spine and maxillofacial structures were also generated. COMPARISON:  12/10/1999 FINDINGS: CT HEAD FINDINGS Brain: No acute intracranial hemorrhage. No focal mass lesion. No CT evidence of acute infarction. No midline shift or mass effect. No hydrocephalus. Basilar cisterns are patent. There are periventricular and subcortical white matter hypodensities. Generalized cortical atrophy. Vascular: No hyperdense vessel or unexpected calcification. Skull: Normal. Negative for fracture or focal lesion. Sinuses/Orbits: Paranasal sinuses and mastoid air cells are clear. Orbits are clear. Other: Scalp hematoma over the anterior LEFT frontal bone. No associated fracture. CT MAXILLOFACIAL FINDINGS Osseous: Orbital rims are intact. And zygomatic arches are normal. No maxillary sinus wall fracture. Pterygoid plates intact. Mandibular condyles located. No mandibular body fracture. Orbits: Globes are normal.  No proptosis Sinuses: No fluid in the paranasal sinuses Soft tissues: Negative. CT CERVICAL SPINE FINDINGS Alignment: Normal alignment of the cervical vertebral bodies. Skull base and vertebrae: Normal craniocervical junction. No loss of vertebral body height or disc height. Normal facet articulation. No evidence of fracture. Soft tissues and spinal canal: No prevertebral soft tissue swelling. No perispinal or epidural hematoma. Disc levels: Multiple levels of endplate spurring and disc space  narrowing. Disc space narrowing most severe at C5-C6 and C6/C7 with endplate sclerosis. Upper chest: Clear Other: Nodular extension of the LEFT lobe of thyroid gland measuring 2.7 cm favored benign nodular goiter. IMPRESSION: 1. No intracranial trauma. 2. LEFT scalp hematoma.  No skull fracture . 3. No facial bone fracture. 4. No cervical spine fracture.  Electronically Signed   By: Suzy Bouchard M.D.   On: 03/18/2020 04:14    Procedures Procedures (including critical care time)  Medications Ordered in ED Medications - No data to display  ED Course  I have reviewed the triage vital signs and the nursing notes.  Pertinent labs & imaging results that were available during my care of the patient were reviewed by me and considered in my medical decision making (see chart for details).    MDM Rules/Calculators/A&P                      Patient presents to the emergency department for evaluation after a fall.  Patient has an abrasion on his knee but no evidence of injury to the knee itself.  His primary constellation of injuries were to scalp and face.  CT head, cervical spine, maxillofacial bones was performed.  No acute injury noted other than soft tissue swelling.  Final Clinical Impression(s) / ED Diagnoses Final diagnoses:  Contusion of face, initial encounter  Abrasion    Rx / DC Orders ED Discharge Orders    None       Riyanshi Wahab, Gwenyth Allegra, MD 03/18/20 [phone]

## 2020-03-24 ENCOUNTER — Ambulatory Visit: Payer: HMO | Admitting: Interventional Cardiology

## 2020-04-06 DIAGNOSIS — I4891 Unspecified atrial fibrillation: Secondary | ICD-10-CM | POA: Diagnosis not present

## 2020-04-06 DIAGNOSIS — E782 Mixed hyperlipidemia: Secondary | ICD-10-CM | POA: Diagnosis not present

## 2020-04-06 DIAGNOSIS — N183 Chronic kidney disease, stage 3 unspecified: Secondary | ICD-10-CM | POA: Diagnosis not present

## 2020-04-06 DIAGNOSIS — M1610 Unilateral primary osteoarthritis, unspecified hip: Secondary | ICD-10-CM | POA: Diagnosis not present

## 2020-04-06 DIAGNOSIS — D582 Other hemoglobinopathies: Secondary | ICD-10-CM | POA: Diagnosis not present

## 2020-04-06 DIAGNOSIS — E1122 Type 2 diabetes mellitus with diabetic chronic kidney disease: Secondary | ICD-10-CM | POA: Diagnosis not present

## 2020-04-06 DIAGNOSIS — N189 Chronic kidney disease, unspecified: Secondary | ICD-10-CM | POA: Diagnosis not present

## 2020-04-06 DIAGNOSIS — I25119 Atherosclerotic heart disease of native coronary artery with unspecified angina pectoris: Secondary | ICD-10-CM | POA: Diagnosis not present

## 2020-04-06 DIAGNOSIS — I10 Essential (primary) hypertension: Secondary | ICD-10-CM | POA: Diagnosis not present

## 2020-04-06 DIAGNOSIS — D500 Iron deficiency anemia secondary to blood loss (chronic): Secondary | ICD-10-CM | POA: Diagnosis not present

## 2020-04-18 ENCOUNTER — Ambulatory Visit: Payer: HMO | Admitting: Interventional Cardiology

## 2020-05-03 ENCOUNTER — Encounter: Payer: Self-pay | Admitting: Neurology

## 2020-05-05 ENCOUNTER — Other Ambulatory Visit: Payer: Self-pay

## 2020-05-05 ENCOUNTER — Encounter: Payer: Self-pay | Admitting: Podiatry

## 2020-05-05 ENCOUNTER — Ambulatory Visit: Payer: HMO | Admitting: Podiatry

## 2020-05-05 VITALS — Temp 97.5°F

## 2020-05-05 DIAGNOSIS — M79674 Pain in right toe(s): Secondary | ICD-10-CM

## 2020-05-05 DIAGNOSIS — B351 Tinea unguium: Secondary | ICD-10-CM | POA: Diagnosis not present

## 2020-05-05 DIAGNOSIS — E1159 Type 2 diabetes mellitus with other circulatory complications: Secondary | ICD-10-CM | POA: Diagnosis not present

## 2020-05-05 DIAGNOSIS — N184 Chronic kidney disease, stage 4 (severe): Secondary | ICD-10-CM

## 2020-05-05 DIAGNOSIS — M79675 Pain in left toe(s): Secondary | ICD-10-CM

## 2020-05-05 NOTE — Progress Notes (Signed)
This patient returns to my office for at risk foot care.  This patient requires this care by a professional since this patient will be at risk due to having diabetic neuropathy and chronic kidney disease.  Patient has not been treated for over 1 year.   This patient is unable to cut nails himself since the patient cannot reach his nails.These nails are painful walking and wearing shoes.  This patient presents for at risk foot care today.  General Appearance  Alert, conversant and in no acute stress.  Vascular  Dorsalis pedis and posterior tibial  pulses are not  Palpable due to swelling   bilaterally.  Capillary return is within normal limits  bilaterally. Temperature is within normal limits  Bilaterally.  Venous stasis  B/L.  Neurologic  Senn-Weinstein monofilament wire test diminished   bilaterally. Muscle power within normal limits bilaterally.  Nails Thick disfigured discolored nails with subungual debris  from hallux to fifth toes bilaterally. No evidence of bacterial infection or drainage bilaterally.  Orthopedic  No limitations of motion  feet .  No crepitus or effusions noted.  No bony pathology or digital deformities noted.  Skin  normotropic skin with no porokeratosis noted bilaterally.  No signs of infections or ulcers noted.     Onychomycosis  Pain in right toes  Pain in left toes  Consent was obtained for treatment procedures.   Mechanical debridement of nails 1-5  bilaterally performed with a nail nipper.  Filed with dremel without incident.    Return office visit    3 months                  Told patient to return for periodic foot care and evaluation due to potential at risk complications.   [name]

## 2020-05-05 NOTE — Patient Outreach (Signed)
  [facility]) Care Management Chronic Special Needs Program  05/05/2020  Name: [name] DOB: 09/17/1934  MRN: [id number]  Mr. [name] is enrolled in a chronic special needs plan for Diabetes.  Client not available and HIPAA compliant message left with wife [name] Wife answered and states that they are at an appointment for him.  Requests that RNCM call them back later next week. Plan for 2nd outreach call in one week Chronic care management coordinator will attempt outreach in one week.   Peter Garter RN, Jackquline Denmark, CDE Chronic Care Management Coordinator [facility] Management [phone]

## 2020-05-09 NOTE — Progress Notes (Deleted)
Cardiology Office Note:    Date:  05/09/2020   ID:  MACALISTER ARNAUD, DOB 12-Sep-1934, MRN [id number]  PCP:  Hulan Fess, MD  Cardiologist:  Sinclair Grooms, MD   Referring MD: Hulan Fess, MD   No chief complaint on file.   History of Present Illness:    [name] is a 86 y.o. male with a hx of coronary artery disease, abdominal aortic aneurysm, hypertension, review his coronary bypass grafting, and recent lower GI bleeding possibly from diverticulosis.  ***  Past Medical History:  Diagnosis Date  . Bilateral foot-drop 01/12/2020  . Coronary artery disease   . Diabetes mellitus without complication ([place])   . Diabetic neuropathy ([place]) 09/16/2017   Mild bilateral foot drops  . Gait abnormality 09/16/2017  . Hyperlipidemia   . Hypertension   . Memory difficulty 03/06/2017  . Toxic goiter     Past Surgical History:  Procedure Laterality Date  . ABDOMINAL AORTIC ANEURYSM REPAIR    . cardiac bypass    . CORONARY ARTERY BYPASS GRAFT      Current Medications: No outpatient medications have been marked as taking for the 05/10/20 encounter (Appointment) with Belva Crome, MD.     Allergies:   Patient has no known allergies.   Social History   Socioeconomic History  . Marital status: Married    Spouse name: [name]  . Number of children: 3  . Years of education: 63  . Highest education level: Not on file  Occupational History  . Not on file  Tobacco Use  . Smoking status: Former Smoker    Packs/day: 1.00    Quit date: 03/31/1996    Years since quitting: 24.1  . Smokeless tobacco: Never Used  Substance and Sexual Activity  . Alcohol use: No  . Drug use: No  . Sexual activity: Yes    Birth control/protection: None  Other Topics Concern  . Not on file  Social History Narrative   Lives  With spouse   Caffeine use:  Coffee ocass   Tea/soda daily   Right-handed   Social Determinants of Health   Financial Resource Strain:   . Difficulty of Paying Living  Expenses:   Food Insecurity:   . Worried About Charity fundraiser in the Last Year:   . Arboriculturist in the Last Year:   Transportation Needs:   . Film/video editor (Medical):   Marland Kitchen Lack of Transportation (Non-Medical):   Physical Activity:   . Days of Exercise per Week:   . Minutes of Exercise per Session:   Stress:   . Feeling of Stress :   Social Connections:   . Frequency of Communication with Friends and Family:   . Frequency of Social Gatherings with Friends and Family:   . Attends Religious Services:   . Active Member of Clubs or Organizations:   . Attends Archivist Meetings:   Marland Kitchen Marital Status:      Family History: [name] includes Diabetes in his brother, brother, brother, and sister; Heart attack in his brother, brother, father, and sister; Stroke in his father and mother.  ROS:   Please see the history of present illness.    *** All other systems reviewed and are negative.  EKGs/Labs/Other Studies Reviewed:    The following studies were reviewed today: ***  EKG:  EKG ***  Recent Labs: 12/10/2019: ALT 25; BUN 37; Creatinine, Ser 2.64; Hemoglobin 16.9; Platelets 162; Potassium 3.3;  Sodium 140  Recent Lipid Panel    Component Value Date/Time   CHOL 93 03/31/2016 0306   TRIG 102 03/31/2016 0306   HDL 35 (L) 03/31/2016 0306   CHOLHDL 2.7 03/31/2016 0306   VLDL 20 03/31/2016 0306   LDLCALC 38 03/31/2016 0306    Physical Exam:    VS:  There were no vitals taken for this visit.    Wt Readings from Last 3 Encounters:  03/18/20 221 lb (100.2 kg)  01/12/20 215 lb (97.5 kg)  02/09/19 233 lb 1.9 oz (105.7 kg)     GEN: ***. No acute distress HEENT: Normal NECK: No JVD. LYMPHATICS: No lymphadenopathy CARDIAC: *** RRR without murmur, gallop, or edema. VASCULAR: *** Normal Pulses. No bruits. RESPIRATORY:  Clear to auscultation without rales, wheezing or rhonchi  ABDOMEN: Soft, non-tender, non-distended, No pulsatile  mass, MUSCULOSKELETAL: No deformity  SKIN: Warm and dry NEUROLOGIC:  Alert and oriented x 3 PSYCHIATRIC:  Normal affect   ASSESSMENT:    1. Coronary artery disease involving native coronary artery of native heart without angina pectoris   2. Essential hypertension   3. Hyperlipidemia, unspecified hyperlipidemia type   4. Diabetes mellitus due to underlying condition with diabetic nephropathy, with long-term current use of insulin ([place])   5. Abdominal aortic aneurysm (AAA) without rupture ([place])   6. Chronic kidney disease, stage IV (severe) ([place])   7. Educated about COVID-19 virus infection    PLAN:    In order of problems listed above:  1. ***   Medication Adjustments/Labs and Tests Ordered: Current medicines are reviewed at length with the patient today.  Concerns regarding medicines are outlined above.  No orders of the defined types were placed in this encounter.  No orders of the defined types were placed in this encounter.   There are no Patient Instructions on file for this visit.   Signed, Sinclair Grooms, MD  05/09/2020 4:39 PM    [place]

## 2020-05-10 ENCOUNTER — Ambulatory Visit: Payer: HMO | Admitting: Interventional Cardiology

## 2020-05-12 ENCOUNTER — Other Ambulatory Visit: Payer: Self-pay

## 2020-05-12 NOTE — Patient Outreach (Signed)
  [facility]) Care Management Chronic Special Needs Program  05/12/2020  Name: [name] DOB: [birth year]  MRN: [id number]  [name] is enrolled in a chronic special needs plan for Diabetes. Client called with no answer No answer and HIPAA compliant message left. 2nd attempt Plan for 3rd outreach call in 1-2weeks Chronic care management coordinator will attempt outreach in 1-2 weeks.   Peter Garter RN, Jackquline Denmark, CDE Chronic Care Management Coordinator [facility] Management [phone]

## 2020-05-26 ENCOUNTER — Other Ambulatory Visit: Payer: Self-pay

## 2020-05-26 NOTE — Patient Outreach (Signed)
[facility] [facility]) Care Management Chronic Special Needs Program  05/26/2020  Name: MICHAELJOSEPH REVOLORIO DOB: 08-Sep-1934  MRN: [id number]  Mr. German Manke is enrolled in a chronic special needs plan for  Diabetes.  Client called with no answer and HIPAA compliant message left.  3rd attempt A completed health risk assessment has not been received from the client and client has not responded to 3 outreach attempts by [facility] to complete 2021 Health Risk Assessment  The client's individualized care plan was developed based on available data and previous 2020 Health Risk Assessment Goals Addressed            This Visit's Progress   . Client understands the importance of follow-up with providers by attending scheduled visits   On track    Plan to keep scheduled appointments with providers    . Client will report no worsening of symptoms related to heart disease within the next 6 months    On track    No ED visits or hospitalizations for heart disease  Notify provider for symptoms of chest pain, sweating, nausea/vomiting, irregular heartbeat, palpitations, rapid heart rate, shortness of breath or dizziness or fainting. Call 911 for severe symptoms of chest pain or shortness of breath. Follow a low salt, heart healthy diet     . COMPLETED: Client will use Assistive Devices as needed and verbalize understanding of device use       Using [employer] approved meter-One Touch per records Goal completed 05/26/20    . Client will verbalize knowledge of self management of Hypertension as evidences by BP reading of 140/90 or less; or as defined by provider   No change    B/P 147/70 at last provider visit Plan to check B/P regularly Take B/P medications as ordered Plan to follow a low salt diet  Increase activity as tolerated     . Client/Caregiver will verbalize understanding of instructions related to self-care and safety   On track    Please call RNCM [phone] if you are  having any difficulty caring for yourself    . HEMOGLOBIN A1C < 7       Last Hemoglobin A1C 7.5% 07/19/19 Plan to check blood sugars as directed with goals fasting or 1 1/2 hours after eating with goal of 80-130 fasting and 180 or less after meals Plan to follow a low carbohydrate, low salt diet, watch portion sizes and avoid sugar sweetened drinks Plan to take your insulin as ordered Please review Diabetes Action plan in the [employer]) calendar    . COMPLETED: Maintain timely refills of diabetic medication as prescribed within the year .   On track    Maintaining timely refills of medications per dispense report It is important to get your medications refilled on time Goal completed 05/26/20    . Obtain annual  Lipid Profile, LDL-C   Not on track    Last completed 10/22/19 LDL 61 The goal for LDL is less than 70 mg/dL as you are at high risk for complications Try to avoid saturated fats, trans-fats and eat more fiber Plan to take statin as ordered    . Obtain Annual Eye (retinal)  Exam    Not on track    Your last documented eye exam was on 08/03/18  Diabetes can affect your vision. Plan to have a dilated eye exam every year    . COMPLETED: Obtain Annual Foot Exam   On track    Last completed  05/05/20 Check your skin and feet every day for cuts, bruises, redness, blisters, or sores. Schedule a foot exam with your health care provider once every year Goal completed 05/26/20    . Obtain annual screen for micro albuminuria (urine) , nephropathy (kidney problems)   Not on track    It is important for your doctor to check your urine for protein at least every year    . Obtain Hemoglobin A1C at least 2 times per year   Not on track    Last completed 07/19/19 It is important to have your Hemoglobin A1C checked every 6 months if you are at goal and every 3 months if you are not at goal    . Visit Primary Care Provider or Endocrinologist at least 2 times per year    Not on track     Last Primary care visit 11/10/19 Please schedule your annual wellness visit        Plan:  . Send unsuccessful outreach letter with a copy of individualized care plan to client . Send individualized care plan to provider  Chronic care management coordinator will attempt outreach in 6 months per tier level.  Peter Garter RN, Jackquline Denmark, CDE Chronic Care Management Coordinator [facility] Management [phone]

## 2020-05-31 DIAGNOSIS — G8929 Other chronic pain: Secondary | ICD-10-CM | POA: Diagnosis not present

## 2020-05-31 DIAGNOSIS — R296 Repeated falls: Secondary | ICD-10-CM | POA: Diagnosis not present

## 2020-05-31 DIAGNOSIS — M25561 Pain in right knee: Secondary | ICD-10-CM | POA: Diagnosis not present

## 2020-06-06 DIAGNOSIS — D582 Other hemoglobinopathies: Secondary | ICD-10-CM | POA: Diagnosis not present

## 2020-06-06 DIAGNOSIS — I10 Essential (primary) hypertension: Secondary | ICD-10-CM | POA: Diagnosis not present

## 2020-06-06 DIAGNOSIS — E1122 Type 2 diabetes mellitus with diabetic chronic kidney disease: Secondary | ICD-10-CM | POA: Diagnosis not present

## 2020-06-06 DIAGNOSIS — N183 Chronic kidney disease, stage 3 unspecified: Secondary | ICD-10-CM | POA: Diagnosis not present

## 2020-06-06 DIAGNOSIS — M1610 Unilateral primary osteoarthritis, unspecified hip: Secondary | ICD-10-CM | POA: Diagnosis not present

## 2020-06-06 DIAGNOSIS — E782 Mixed hyperlipidemia: Secondary | ICD-10-CM | POA: Diagnosis not present

## 2020-06-06 DIAGNOSIS — I4891 Unspecified atrial fibrillation: Secondary | ICD-10-CM | POA: Diagnosis not present

## 2020-06-06 DIAGNOSIS — I25119 Atherosclerotic heart disease of native coronary artery with unspecified angina pectoris: Secondary | ICD-10-CM | POA: Diagnosis not present

## 2020-06-06 DIAGNOSIS — D500 Iron deficiency anemia secondary to blood loss (chronic): Secondary | ICD-10-CM | POA: Diagnosis not present

## 2020-06-06 DIAGNOSIS — N189 Chronic kidney disease, unspecified: Secondary | ICD-10-CM | POA: Diagnosis not present

## 2020-06-13 ENCOUNTER — Ambulatory Visit: Payer: HMO | Admitting: Neurology

## 2020-07-05 DIAGNOSIS — E1122 Type 2 diabetes mellitus with diabetic chronic kidney disease: Secondary | ICD-10-CM | POA: Diagnosis not present

## 2020-07-05 DIAGNOSIS — M1610 Unilateral primary osteoarthritis, unspecified hip: Secondary | ICD-10-CM | POA: Diagnosis not present

## 2020-07-05 DIAGNOSIS — N184 Chronic kidney disease, stage 4 (severe): Secondary | ICD-10-CM | POA: Diagnosis not present

## 2020-07-05 DIAGNOSIS — I4891 Unspecified atrial fibrillation: Secondary | ICD-10-CM | POA: Diagnosis not present

## 2020-07-05 DIAGNOSIS — E782 Mixed hyperlipidemia: Secondary | ICD-10-CM | POA: Diagnosis not present

## 2020-07-05 DIAGNOSIS — I25119 Atherosclerotic heart disease of native coronary artery with unspecified angina pectoris: Secondary | ICD-10-CM | POA: Diagnosis not present

## 2020-07-05 DIAGNOSIS — D582 Other hemoglobinopathies: Secondary | ICD-10-CM | POA: Diagnosis not present

## 2020-07-05 DIAGNOSIS — D500 Iron deficiency anemia secondary to blood loss (chronic): Secondary | ICD-10-CM | POA: Diagnosis not present

## 2020-07-05 DIAGNOSIS — I10 Essential (primary) hypertension: Secondary | ICD-10-CM | POA: Diagnosis not present

## 2020-07-05 DIAGNOSIS — N189 Chronic kidney disease, unspecified: Secondary | ICD-10-CM | POA: Diagnosis not present

## 2020-07-05 DIAGNOSIS — N183 Chronic kidney disease, stage 3 unspecified: Secondary | ICD-10-CM | POA: Diagnosis not present

## 2020-07-14 DIAGNOSIS — Z8639 Personal history of other endocrine, nutritional and metabolic disease: Secondary | ICD-10-CM | POA: Diagnosis not present

## 2020-07-14 DIAGNOSIS — Z9889 Other specified postprocedural states: Secondary | ICD-10-CM | POA: Diagnosis not present

## 2020-07-14 DIAGNOSIS — N184 Chronic kidney disease, stage 4 (severe): Secondary | ICD-10-CM | POA: Diagnosis not present

## 2020-07-14 DIAGNOSIS — I4891 Unspecified atrial fibrillation: Secondary | ICD-10-CM | POA: Diagnosis not present

## 2020-07-14 DIAGNOSIS — F338 Other recurrent depressive disorders: Secondary | ICD-10-CM | POA: Diagnosis not present

## 2020-07-14 DIAGNOSIS — I10 Essential (primary) hypertension: Secondary | ICD-10-CM | POA: Diagnosis not present

## 2020-07-14 DIAGNOSIS — I25119 Atherosclerotic heart disease of native coronary artery with unspecified angina pectoris: Secondary | ICD-10-CM | POA: Diagnosis not present

## 2020-07-14 DIAGNOSIS — R413 Other amnesia: Secondary | ICD-10-CM | POA: Diagnosis not present

## 2020-07-14 DIAGNOSIS — E782 Mixed hyperlipidemia: Secondary | ICD-10-CM | POA: Diagnosis not present

## 2020-07-14 DIAGNOSIS — D696 Thrombocytopenia, unspecified: Secondary | ICD-10-CM | POA: Diagnosis not present

## 2020-07-14 DIAGNOSIS — E1122 Type 2 diabetes mellitus with diabetic chronic kidney disease: Secondary | ICD-10-CM | POA: Diagnosis not present

## 2020-07-14 DIAGNOSIS — R296 Repeated falls: Secondary | ICD-10-CM | POA: Diagnosis not present

## 2020-07-22 DIAGNOSIS — R413 Other amnesia: Secondary | ICD-10-CM | POA: Diagnosis not present

## 2020-07-22 DIAGNOSIS — I714 Abdominal aortic aneurysm, without rupture: Secondary | ICD-10-CM | POA: Diagnosis not present

## 2020-07-22 DIAGNOSIS — N184 Chronic kidney disease, stage 4 (severe): Secondary | ICD-10-CM | POA: Diagnosis not present

## 2020-07-22 DIAGNOSIS — I2510 Atherosclerotic heart disease of native coronary artery without angina pectoris: Secondary | ICD-10-CM | POA: Diagnosis not present

## 2020-07-22 DIAGNOSIS — I129 Hypertensive chronic kidney disease with stage 1 through stage 4 chronic kidney disease, or unspecified chronic kidney disease: Secondary | ICD-10-CM | POA: Diagnosis not present

## 2020-07-22 DIAGNOSIS — Z9049 Acquired absence of other specified parts of digestive tract: Secondary | ICD-10-CM | POA: Diagnosis not present

## 2020-07-22 DIAGNOSIS — Z794 Long term (current) use of insulin: Secondary | ICD-10-CM | POA: Diagnosis not present

## 2020-07-22 DIAGNOSIS — E785 Hyperlipidemia, unspecified: Secondary | ICD-10-CM | POA: Diagnosis not present

## 2020-07-22 DIAGNOSIS — Z7982 Long term (current) use of aspirin: Secondary | ICD-10-CM | POA: Diagnosis not present

## 2020-07-22 DIAGNOSIS — I4891 Unspecified atrial fibrillation: Secondary | ICD-10-CM | POA: Diagnosis not present

## 2020-07-22 DIAGNOSIS — F338 Other recurrent depressive disorders: Secondary | ICD-10-CM | POA: Diagnosis not present

## 2020-07-22 DIAGNOSIS — Z86010 Personal history of colonic polyps: Secondary | ICD-10-CM | POA: Diagnosis not present

## 2020-07-22 DIAGNOSIS — Z951 Presence of aortocoronary bypass graft: Secondary | ICD-10-CM | POA: Diagnosis not present

## 2020-07-22 DIAGNOSIS — G4733 Obstructive sleep apnea (adult) (pediatric): Secondary | ICD-10-CM | POA: Diagnosis not present

## 2020-07-22 DIAGNOSIS — D500 Iron deficiency anemia secondary to blood loss (chronic): Secondary | ICD-10-CM | POA: Diagnosis not present

## 2020-07-22 DIAGNOSIS — Z8639 Personal history of other endocrine, nutritional and metabolic disease: Secondary | ICD-10-CM | POA: Diagnosis not present

## 2020-07-22 DIAGNOSIS — E1122 Type 2 diabetes mellitus with diabetic chronic kidney disease: Secondary | ICD-10-CM | POA: Diagnosis not present

## 2020-07-24 ENCOUNTER — Other Ambulatory Visit: Payer: Self-pay

## 2020-07-24 DIAGNOSIS — I714 Abdominal aortic aneurysm, without rupture, unspecified: Secondary | ICD-10-CM

## 2020-07-26 ENCOUNTER — Ambulatory Visit: Payer: HMO | Admitting: Vascular Surgery

## 2020-07-26 ENCOUNTER — Other Ambulatory Visit (HOSPITAL_COMMUNITY): Payer: HMO

## 2020-07-28 DIAGNOSIS — E1122 Type 2 diabetes mellitus with diabetic chronic kidney disease: Secondary | ICD-10-CM | POA: Diagnosis not present

## 2020-07-28 DIAGNOSIS — Z8639 Personal history of other endocrine, nutritional and metabolic disease: Secondary | ICD-10-CM | POA: Diagnosis not present

## 2020-07-28 DIAGNOSIS — I4891 Unspecified atrial fibrillation: Secondary | ICD-10-CM | POA: Diagnosis not present

## 2020-07-28 DIAGNOSIS — N184 Chronic kidney disease, stage 4 (severe): Secondary | ICD-10-CM | POA: Diagnosis not present

## 2020-07-28 DIAGNOSIS — F338 Other recurrent depressive disorders: Secondary | ICD-10-CM | POA: Diagnosis not present

## 2020-07-28 DIAGNOSIS — E785 Hyperlipidemia, unspecified: Secondary | ICD-10-CM | POA: Diagnosis not present

## 2020-07-28 DIAGNOSIS — Z951 Presence of aortocoronary bypass graft: Secondary | ICD-10-CM | POA: Diagnosis not present

## 2020-07-28 DIAGNOSIS — D500 Iron deficiency anemia secondary to blood loss (chronic): Secondary | ICD-10-CM | POA: Diagnosis not present

## 2020-07-28 DIAGNOSIS — Z86010 Personal history of colonic polyps: Secondary | ICD-10-CM | POA: Diagnosis not present

## 2020-07-28 DIAGNOSIS — I2510 Atherosclerotic heart disease of native coronary artery without angina pectoris: Secondary | ICD-10-CM | POA: Diagnosis not present

## 2020-07-28 DIAGNOSIS — Z7982 Long term (current) use of aspirin: Secondary | ICD-10-CM | POA: Diagnosis not present

## 2020-07-28 DIAGNOSIS — I129 Hypertensive chronic kidney disease with stage 1 through stage 4 chronic kidney disease, or unspecified chronic kidney disease: Secondary | ICD-10-CM | POA: Diagnosis not present

## 2020-07-28 DIAGNOSIS — G4733 Obstructive sleep apnea (adult) (pediatric): Secondary | ICD-10-CM | POA: Diagnosis not present

## 2020-07-28 DIAGNOSIS — Z794 Long term (current) use of insulin: Secondary | ICD-10-CM | POA: Diagnosis not present

## 2020-07-28 DIAGNOSIS — R413 Other amnesia: Secondary | ICD-10-CM | POA: Diagnosis not present

## 2020-07-28 DIAGNOSIS — I714 Abdominal aortic aneurysm, without rupture: Secondary | ICD-10-CM | POA: Diagnosis not present

## 2020-07-28 DIAGNOSIS — Z9049 Acquired absence of other specified parts of digestive tract: Secondary | ICD-10-CM | POA: Diagnosis not present

## 2020-08-08 ENCOUNTER — Ambulatory Visit: Payer: HMO | Admitting: Podiatry

## 2020-08-08 ENCOUNTER — Encounter: Payer: Self-pay | Admitting: Podiatry

## 2020-08-08 ENCOUNTER — Other Ambulatory Visit: Payer: Self-pay

## 2020-08-08 DIAGNOSIS — M79675 Pain in left toe(s): Secondary | ICD-10-CM

## 2020-08-08 DIAGNOSIS — I25119 Atherosclerotic heart disease of native coronary artery with unspecified angina pectoris: Secondary | ICD-10-CM | POA: Diagnosis not present

## 2020-08-08 DIAGNOSIS — N183 Chronic kidney disease, stage 3 unspecified: Secondary | ICD-10-CM | POA: Diagnosis not present

## 2020-08-08 DIAGNOSIS — I10 Essential (primary) hypertension: Secondary | ICD-10-CM | POA: Diagnosis not present

## 2020-08-08 DIAGNOSIS — N189 Chronic kidney disease, unspecified: Secondary | ICD-10-CM | POA: Diagnosis not present

## 2020-08-08 DIAGNOSIS — M79674 Pain in right toe(s): Secondary | ICD-10-CM | POA: Diagnosis not present

## 2020-08-08 DIAGNOSIS — N184 Chronic kidney disease, stage 4 (severe): Secondary | ICD-10-CM | POA: Diagnosis not present

## 2020-08-08 DIAGNOSIS — B351 Tinea unguium: Secondary | ICD-10-CM | POA: Diagnosis not present

## 2020-08-08 DIAGNOSIS — D582 Other hemoglobinopathies: Secondary | ICD-10-CM | POA: Diagnosis not present

## 2020-08-08 DIAGNOSIS — M1610 Unilateral primary osteoarthritis, unspecified hip: Secondary | ICD-10-CM | POA: Diagnosis not present

## 2020-08-08 DIAGNOSIS — E1159 Type 2 diabetes mellitus with other circulatory complications: Secondary | ICD-10-CM

## 2020-08-08 DIAGNOSIS — D500 Iron deficiency anemia secondary to blood loss (chronic): Secondary | ICD-10-CM | POA: Diagnosis not present

## 2020-08-08 DIAGNOSIS — E782 Mixed hyperlipidemia: Secondary | ICD-10-CM | POA: Diagnosis not present

## 2020-08-08 DIAGNOSIS — I4891 Unspecified atrial fibrillation: Secondary | ICD-10-CM | POA: Diagnosis not present

## 2020-08-08 DIAGNOSIS — E1122 Type 2 diabetes mellitus with diabetic chronic kidney disease: Secondary | ICD-10-CM | POA: Diagnosis not present

## 2020-08-08 NOTE — Progress Notes (Signed)
This patient returns to my office for at risk foot care.  This patient requires this care by a professional since this patient will be at risk due to having diabetic neuropathy and chronic kidney disease.  Patient has not been treated for over 1 year.    This patient is unable to cut nails himself since the patient cannot reach his nails.These nails are painful walking and wearing shoes.  This patient presents for at risk foot care today.  General Appearance  Alert, conversant and in no acute stress.  Vascular  Dorsalis pedis and posterior tibial  pulses are not  Palpable due to swelling   bilaterally.  Capillary return is within normal limits  bilaterally. Temperature is within normal limits  Bilaterally.  Venous stasis  B/L.  Neurologic  Senn-Weinstein monofilament wire test diminished   bilaterally. Muscle power within normal limits bilaterally.  Nails Thick disfigured discolored nails with subungual debris  from hallux to fifth toes bilaterally. No evidence of bacterial infection or drainage bilaterally.  Orthopedic  No limitations of motion  feet .  No crepitus or effusions noted.  No bony pathology or digital deformities noted.  Skin  normotropic skin with no porokeratosis noted bilaterally.  No signs of infections or ulcers noted.     Onychomycosis  Pain in right toes  Pain in left toes  Consent was obtained for treatment procedures.   Mechanical debridement of nails 1-5  bilaterally performed with a nail nipper.  Filed with dremel without incident.    Return office visit    3 months                  Told patient to return for periodic foot care and evaluation due to potential at risk complications.   [name]

## 2020-08-09 DIAGNOSIS — N184 Chronic kidney disease, stage 4 (severe): Secondary | ICD-10-CM | POA: Diagnosis not present

## 2020-08-09 DIAGNOSIS — I2510 Atherosclerotic heart disease of native coronary artery without angina pectoris: Secondary | ICD-10-CM | POA: Diagnosis not present

## 2020-08-09 DIAGNOSIS — D631 Anemia in chronic kidney disease: Secondary | ICD-10-CM | POA: Diagnosis not present

## 2020-08-09 DIAGNOSIS — E1122 Type 2 diabetes mellitus with diabetic chronic kidney disease: Secondary | ICD-10-CM | POA: Diagnosis not present

## 2020-08-09 DIAGNOSIS — E039 Hypothyroidism, unspecified: Secondary | ICD-10-CM | POA: Diagnosis not present

## 2020-08-09 DIAGNOSIS — I129 Hypertensive chronic kidney disease with stage 1 through stage 4 chronic kidney disease, or unspecified chronic kidney disease: Secondary | ICD-10-CM | POA: Diagnosis not present

## 2020-08-09 DIAGNOSIS — E782 Mixed hyperlipidemia: Secondary | ICD-10-CM | POA: Diagnosis not present

## 2020-08-09 DIAGNOSIS — I4891 Unspecified atrial fibrillation: Secondary | ICD-10-CM | POA: Diagnosis not present

## 2020-08-09 DIAGNOSIS — E8770 Fluid overload, unspecified: Secondary | ICD-10-CM | POA: Diagnosis not present

## 2020-08-09 DIAGNOSIS — N189 Chronic kidney disease, unspecified: Secondary | ICD-10-CM | POA: Diagnosis not present

## 2020-08-09 DIAGNOSIS — I714 Abdominal aortic aneurysm, without rupture: Secondary | ICD-10-CM | POA: Diagnosis not present

## 2020-08-09 DIAGNOSIS — N2581 Secondary hyperparathyroidism of renal origin: Secondary | ICD-10-CM | POA: Diagnosis not present

## 2020-08-10 DIAGNOSIS — D500 Iron deficiency anemia secondary to blood loss (chronic): Secondary | ICD-10-CM | POA: Diagnosis not present

## 2020-08-10 DIAGNOSIS — Z9049 Acquired absence of other specified parts of digestive tract: Secondary | ICD-10-CM | POA: Diagnosis not present

## 2020-08-10 DIAGNOSIS — I2510 Atherosclerotic heart disease of native coronary artery without angina pectoris: Secondary | ICD-10-CM | POA: Diagnosis not present

## 2020-08-10 DIAGNOSIS — I714 Abdominal aortic aneurysm, without rupture: Secondary | ICD-10-CM | POA: Diagnosis not present

## 2020-08-10 DIAGNOSIS — Z8639 Personal history of other endocrine, nutritional and metabolic disease: Secondary | ICD-10-CM | POA: Diagnosis not present

## 2020-08-10 DIAGNOSIS — E785 Hyperlipidemia, unspecified: Secondary | ICD-10-CM | POA: Diagnosis not present

## 2020-08-10 DIAGNOSIS — F338 Other recurrent depressive disorders: Secondary | ICD-10-CM | POA: Diagnosis not present

## 2020-08-10 DIAGNOSIS — E1122 Type 2 diabetes mellitus with diabetic chronic kidney disease: Secondary | ICD-10-CM | POA: Diagnosis not present

## 2020-08-10 DIAGNOSIS — I129 Hypertensive chronic kidney disease with stage 1 through stage 4 chronic kidney disease, or unspecified chronic kidney disease: Secondary | ICD-10-CM | POA: Diagnosis not present

## 2020-08-10 DIAGNOSIS — Z951 Presence of aortocoronary bypass graft: Secondary | ICD-10-CM | POA: Diagnosis not present

## 2020-08-10 DIAGNOSIS — G4733 Obstructive sleep apnea (adult) (pediatric): Secondary | ICD-10-CM | POA: Diagnosis not present

## 2020-08-10 DIAGNOSIS — Z86010 Personal history of colonic polyps: Secondary | ICD-10-CM | POA: Diagnosis not present

## 2020-08-10 DIAGNOSIS — N184 Chronic kidney disease, stage 4 (severe): Secondary | ICD-10-CM | POA: Diagnosis not present

## 2020-08-10 DIAGNOSIS — Z794 Long term (current) use of insulin: Secondary | ICD-10-CM | POA: Diagnosis not present

## 2020-08-10 DIAGNOSIS — Z7982 Long term (current) use of aspirin: Secondary | ICD-10-CM | POA: Diagnosis not present

## 2020-08-10 DIAGNOSIS — R413 Other amnesia: Secondary | ICD-10-CM | POA: Diagnosis not present

## 2020-08-10 DIAGNOSIS — I4891 Unspecified atrial fibrillation: Secondary | ICD-10-CM | POA: Diagnosis not present

## 2020-08-15 DIAGNOSIS — D500 Iron deficiency anemia secondary to blood loss (chronic): Secondary | ICD-10-CM | POA: Diagnosis not present

## 2020-08-15 DIAGNOSIS — Z794 Long term (current) use of insulin: Secondary | ICD-10-CM | POA: Diagnosis not present

## 2020-08-15 DIAGNOSIS — Z86010 Personal history of colonic polyps: Secondary | ICD-10-CM | POA: Diagnosis not present

## 2020-08-15 DIAGNOSIS — I129 Hypertensive chronic kidney disease with stage 1 through stage 4 chronic kidney disease, or unspecified chronic kidney disease: Secondary | ICD-10-CM | POA: Diagnosis not present

## 2020-08-15 DIAGNOSIS — Z9049 Acquired absence of other specified parts of digestive tract: Secondary | ICD-10-CM | POA: Diagnosis not present

## 2020-08-15 DIAGNOSIS — I2510 Atherosclerotic heart disease of native coronary artery without angina pectoris: Secondary | ICD-10-CM | POA: Diagnosis not present

## 2020-08-15 DIAGNOSIS — N184 Chronic kidney disease, stage 4 (severe): Secondary | ICD-10-CM | POA: Diagnosis not present

## 2020-08-15 DIAGNOSIS — G4733 Obstructive sleep apnea (adult) (pediatric): Secondary | ICD-10-CM | POA: Diagnosis not present

## 2020-08-15 DIAGNOSIS — R413 Other amnesia: Secondary | ICD-10-CM | POA: Diagnosis not present

## 2020-08-15 DIAGNOSIS — Z951 Presence of aortocoronary bypass graft: Secondary | ICD-10-CM | POA: Diagnosis not present

## 2020-08-15 DIAGNOSIS — Z8639 Personal history of other endocrine, nutritional and metabolic disease: Secondary | ICD-10-CM | POA: Diagnosis not present

## 2020-08-15 DIAGNOSIS — E1122 Type 2 diabetes mellitus with diabetic chronic kidney disease: Secondary | ICD-10-CM | POA: Diagnosis not present

## 2020-08-15 DIAGNOSIS — I4891 Unspecified atrial fibrillation: Secondary | ICD-10-CM | POA: Diagnosis not present

## 2020-08-15 DIAGNOSIS — Z7982 Long term (current) use of aspirin: Secondary | ICD-10-CM | POA: Diagnosis not present

## 2020-08-15 DIAGNOSIS — E785 Hyperlipidemia, unspecified: Secondary | ICD-10-CM | POA: Diagnosis not present

## 2020-08-15 DIAGNOSIS — I714 Abdominal aortic aneurysm, without rupture: Secondary | ICD-10-CM | POA: Diagnosis not present

## 2020-08-15 DIAGNOSIS — F338 Other recurrent depressive disorders: Secondary | ICD-10-CM | POA: Diagnosis not present

## 2020-08-18 DIAGNOSIS — I639 Cerebral infarction, unspecified: Secondary | ICD-10-CM

## 2020-08-18 HISTORY — DX: Cerebral infarction, unspecified: I63.9

## 2020-08-19 ENCOUNTER — Emergency Department (HOSPITAL_COMMUNITY): Payer: HMO

## 2020-08-19 ENCOUNTER — Encounter (HOSPITAL_COMMUNITY): Payer: Self-pay | Admitting: Emergency Medicine

## 2020-08-19 ENCOUNTER — Inpatient Hospital Stay (HOSPITAL_COMMUNITY)
Admission: EM | Admit: 2020-08-19 | Discharge: 2020-08-30 | DRG: 065 | Disposition: A | Discharge status: Skilled Nursing Facility | Payer: HMO | Attending: Internal Medicine | Admitting: Internal Medicine

## 2020-08-19 DIAGNOSIS — E785 Hyperlipidemia, unspecified: Secondary | ICD-10-CM | POA: Diagnosis not present

## 2020-08-19 DIAGNOSIS — I69318 Other symptoms and signs involving cognitive functions following cerebral infarction: Secondary | ICD-10-CM | POA: Diagnosis not present

## 2020-08-19 DIAGNOSIS — Z7982 Long term (current) use of aspirin: Secondary | ICD-10-CM

## 2020-08-19 DIAGNOSIS — F419 Anxiety disorder, unspecified: Secondary | ICD-10-CM | POA: Diagnosis present

## 2020-08-19 DIAGNOSIS — F32A Depression, unspecified: Secondary | ICD-10-CM | POA: Diagnosis present

## 2020-08-19 DIAGNOSIS — M2550 Pain in unspecified joint: Secondary | ICD-10-CM | POA: Diagnosis not present

## 2020-08-19 DIAGNOSIS — I639 Cerebral infarction, unspecified: Secondary | ICD-10-CM | POA: Diagnosis not present

## 2020-08-19 DIAGNOSIS — R296 Repeated falls: Secondary | ICD-10-CM | POA: Diagnosis present

## 2020-08-19 DIAGNOSIS — E1140 Type 2 diabetes mellitus with diabetic neuropathy, unspecified: Secondary | ICD-10-CM | POA: Diagnosis present

## 2020-08-19 DIAGNOSIS — F329 Major depressive disorder, single episode, unspecified: Secondary | ICD-10-CM | POA: Diagnosis present

## 2020-08-19 DIAGNOSIS — Z23 Encounter for immunization: Secondary | ICD-10-CM | POA: Diagnosis not present

## 2020-08-19 DIAGNOSIS — L03114 Cellulitis of left upper limb: Secondary | ICD-10-CM | POA: Diagnosis present

## 2020-08-19 DIAGNOSIS — E1122 Type 2 diabetes mellitus with diabetic chronic kidney disease: Secondary | ICD-10-CM | POA: Diagnosis present

## 2020-08-19 DIAGNOSIS — G8194 Hemiplegia, unspecified affecting left nondominant side: Secondary | ICD-10-CM | POA: Diagnosis present

## 2020-08-19 DIAGNOSIS — E1165 Type 2 diabetes mellitus with hyperglycemia: Secondary | ICD-10-CM | POA: Diagnosis present

## 2020-08-19 DIAGNOSIS — R600 Localized edema: Secondary | ICD-10-CM | POA: Diagnosis not present

## 2020-08-19 DIAGNOSIS — N179 Acute kidney failure, unspecified: Secondary | ICD-10-CM | POA: Diagnosis not present

## 2020-08-19 DIAGNOSIS — E876 Hypokalemia: Secondary | ICD-10-CM | POA: Diagnosis present

## 2020-08-19 DIAGNOSIS — Z20822 Contact with and (suspected) exposure to covid-19: Secondary | ICD-10-CM | POA: Diagnosis present

## 2020-08-19 DIAGNOSIS — R4182 Altered mental status, unspecified: Secondary | ICD-10-CM | POA: Diagnosis not present

## 2020-08-19 DIAGNOSIS — E11649 Type 2 diabetes mellitus with hypoglycemia without coma: Secondary | ICD-10-CM | POA: Diagnosis not present

## 2020-08-19 DIAGNOSIS — M7989 Other specified soft tissue disorders: Secondary | ICD-10-CM | POA: Diagnosis present

## 2020-08-19 DIAGNOSIS — I129 Hypertensive chronic kidney disease with stage 1 through stage 4 chronic kidney disease, or unspecified chronic kidney disease: Secondary | ICD-10-CM | POA: Diagnosis present

## 2020-08-19 DIAGNOSIS — F0390 Unspecified dementia without behavioral disturbance: Secondary | ICD-10-CM | POA: Diagnosis present

## 2020-08-19 DIAGNOSIS — I361 Nonrheumatic tricuspid (valve) insufficiency: Secondary | ICD-10-CM | POA: Diagnosis not present

## 2020-08-19 DIAGNOSIS — Z833 Family history of diabetes mellitus: Secondary | ICD-10-CM

## 2020-08-19 DIAGNOSIS — L0390 Cellulitis, unspecified: Secondary | ICD-10-CM | POA: Diagnosis present

## 2020-08-19 DIAGNOSIS — R471 Dysarthria and anarthria: Secondary | ICD-10-CM | POA: Diagnosis present

## 2020-08-19 DIAGNOSIS — H9193 Unspecified hearing loss, bilateral: Secondary | ICD-10-CM | POA: Diagnosis present

## 2020-08-19 DIAGNOSIS — N186 End stage renal disease: Secondary | ICD-10-CM | POA: Diagnosis not present

## 2020-08-19 DIAGNOSIS — Z794 Long term (current) use of insulin: Secondary | ICD-10-CM | POA: Diagnosis not present

## 2020-08-19 DIAGNOSIS — Z951 Presence of aortocoronary bypass graft: Secondary | ICD-10-CM

## 2020-08-19 DIAGNOSIS — M21372 Foot drop, left foot: Secondary | ICD-10-CM | POA: Diagnosis present

## 2020-08-19 DIAGNOSIS — R531 Weakness: Secondary | ICD-10-CM | POA: Diagnosis not present

## 2020-08-19 DIAGNOSIS — R9431 Abnormal electrocardiogram [ECG] [EKG]: Secondary | ICD-10-CM | POA: Diagnosis not present

## 2020-08-19 DIAGNOSIS — I6381 Other cerebral infarction due to occlusion or stenosis of small artery: Principal | ICD-10-CM | POA: Diagnosis present

## 2020-08-19 DIAGNOSIS — I10 Essential (primary) hypertension: Secondary | ICD-10-CM | POA: Diagnosis not present

## 2020-08-19 DIAGNOSIS — R0902 Hypoxemia: Secondary | ICD-10-CM | POA: Diagnosis not present

## 2020-08-19 DIAGNOSIS — R627 Adult failure to thrive: Secondary | ICD-10-CM | POA: Diagnosis not present

## 2020-08-19 DIAGNOSIS — R29703 NIHSS score 3: Secondary | ICD-10-CM | POA: Diagnosis present

## 2020-08-19 DIAGNOSIS — T501X5A Adverse effect of loop [high-ceiling] diuretics, initial encounter: Secondary | ICD-10-CM | POA: Diagnosis not present

## 2020-08-19 DIAGNOSIS — N184 Chronic kidney disease, stage 4 (severe): Secondary | ICD-10-CM

## 2020-08-19 DIAGNOSIS — D696 Thrombocytopenia, unspecified: Secondary | ICD-10-CM | POA: Diagnosis present

## 2020-08-19 DIAGNOSIS — Z79899 Other long term (current) drug therapy: Secondary | ICD-10-CM

## 2020-08-19 DIAGNOSIS — E1151 Type 2 diabetes mellitus with diabetic peripheral angiopathy without gangrene: Secondary | ICD-10-CM | POA: Diagnosis present

## 2020-08-19 DIAGNOSIS — E119 Type 2 diabetes mellitus without complications: Secondary | ICD-10-CM

## 2020-08-19 DIAGNOSIS — R41841 Cognitive communication deficit: Secondary | ICD-10-CM | POA: Diagnosis not present

## 2020-08-19 DIAGNOSIS — I69991 Dysphagia following unspecified cerebrovascular disease: Secondary | ICD-10-CM | POA: Diagnosis not present

## 2020-08-19 DIAGNOSIS — K219 Gastro-esophageal reflux disease without esophagitis: Secondary | ICD-10-CM | POA: Diagnosis not present

## 2020-08-19 DIAGNOSIS — R29810 Facial weakness: Secondary | ICD-10-CM | POA: Diagnosis present

## 2020-08-19 DIAGNOSIS — R739 Hyperglycemia, unspecified: Secondary | ICD-10-CM | POA: Diagnosis present

## 2020-08-19 DIAGNOSIS — R262 Difficulty in walking, not elsewhere classified: Secondary | ICD-10-CM | POA: Diagnosis not present

## 2020-08-19 DIAGNOSIS — F29 Unspecified psychosis not due to a substance or known physiological condition: Secondary | ICD-10-CM | POA: Diagnosis not present

## 2020-08-19 DIAGNOSIS — M21371 Foot drop, right foot: Secondary | ICD-10-CM | POA: Diagnosis present

## 2020-08-19 DIAGNOSIS — R278 Other lack of coordination: Secondary | ICD-10-CM | POA: Diagnosis not present

## 2020-08-19 DIAGNOSIS — E039 Hypothyroidism, unspecified: Secondary | ICD-10-CM | POA: Diagnosis present

## 2020-08-19 DIAGNOSIS — Z823 Family history of stroke: Secondary | ICD-10-CM

## 2020-08-19 DIAGNOSIS — Z87891 Personal history of nicotine dependence: Secondary | ICD-10-CM

## 2020-08-19 DIAGNOSIS — I619 Nontraumatic intracerebral hemorrhage, unspecified: Secondary | ICD-10-CM | POA: Diagnosis not present

## 2020-08-19 DIAGNOSIS — I351 Nonrheumatic aortic (valve) insufficiency: Secondary | ICD-10-CM | POA: Diagnosis not present

## 2020-08-19 DIAGNOSIS — H748X2 Other specified disorders of left middle ear and mastoid: Secondary | ICD-10-CM | POA: Diagnosis not present

## 2020-08-19 DIAGNOSIS — I2510 Atherosclerotic heart disease of native coronary artery without angina pectoris: Secondary | ICD-10-CM | POA: Diagnosis not present

## 2020-08-19 DIAGNOSIS — I350 Nonrheumatic aortic (valve) stenosis: Secondary | ICD-10-CM | POA: Diagnosis not present

## 2020-08-19 DIAGNOSIS — M6281 Muscle weakness (generalized): Secondary | ICD-10-CM | POA: Diagnosis not present

## 2020-08-19 DIAGNOSIS — Z79890 Hormone replacement therapy (postmenopausal): Secondary | ICD-10-CM

## 2020-08-19 DIAGNOSIS — Z7401 Bed confinement status: Secondary | ICD-10-CM | POA: Diagnosis not present

## 2020-08-19 DIAGNOSIS — G894 Chronic pain syndrome: Secondary | ICD-10-CM | POA: Diagnosis not present

## 2020-08-19 LAB — DIFFERENTIAL
Abs Immature Granulocytes: 0.04 10*3/uL (ref 0.00–0.07)
Basophils Absolute: 0 10*3/uL (ref 0.0–0.1)
Basophils Relative: 1 %
Eosinophils Absolute: 0.1 10*3/uL (ref 0.0–0.5)
Eosinophils Relative: 1 %
Immature Granulocytes: 1 %
Lymphocytes Relative: 9 %
Lymphs Abs: 0.8 10*3/uL (ref 0.7–4.0)
Monocytes Absolute: 0.6 10*3/uL (ref 0.1–1.0)
Monocytes Relative: 7 %
Neutro Abs: 6.9 10*3/uL (ref 1.7–7.7)
Neutrophils Relative %: 81 %

## 2020-08-19 LAB — COMPREHENSIVE METABOLIC PANEL
ALT: 19 U/L (ref 0–44)
AST: 18 U/L (ref 15–41)
Albumin: 3.3 g/dL — ABNORMAL LOW (ref 3.5–5.0)
Alkaline Phosphatase: 76 U/L (ref 38–126)
Anion gap: 9 (ref 5–15)
BUN: 38 mg/dL — ABNORMAL HIGH (ref 8–23)
CO2: 24 mmol/L (ref 22–32)
Calcium: 9.5 mg/dL (ref 8.9–10.3)
Chloride: 103 mmol/L (ref 98–111)
Creatinine, Ser: 2.54 mg/dL — ABNORMAL HIGH (ref 0.61–1.24)
GFR calc Af Amer: 26 mL/min — ABNORMAL LOW (ref >60)
GFR calc non Af Amer: 22 mL/min — ABNORMAL LOW (ref >60)
Glucose, Bld: 471 mg/dL — ABNORMAL HIGH (ref 70–99)
Potassium: 3.8 mmol/L (ref 3.5–5.1)
Sodium: 136 mmol/L (ref 135–145)
Total Bilirubin: 0.8 mg/dL (ref 0.3–1.2)
Total Protein: 5.8 g/dL — ABNORMAL LOW (ref 6.5–8.1)

## 2020-08-19 LAB — CBC
HCT: 47.5 % (ref 39.0–52.0)
Hemoglobin: 15.4 g/dL (ref 13.0–17.0)
MCH: 28.8 pg (ref 26.0–34.0)
MCHC: 32.4 g/dL (ref 30.0–36.0)
MCV: 88.8 fL (ref 80.0–100.0)
Platelets: 140 10*3/uL — ABNORMAL LOW (ref 150–400)
RBC: 5.35 MIL/uL (ref 4.22–5.81)
RDW: 14 % (ref 11.5–15.5)
WBC: 8.5 10*3/uL (ref 4.0–10.5)
nRBC: 0 % (ref 0.0–0.2)

## 2020-08-19 LAB — APTT: aPTT: 34 seconds (ref 24–36)

## 2020-08-19 LAB — PROTIME-INR
INR: 1.1 (ref 0.8–1.2)
Prothrombin Time: 13.6 seconds (ref 11.4–15.2)

## 2020-08-19 MED ORDER — SODIUM CHLORIDE 0.9% FLUSH
3.0000 mL | Freq: Once | INTRAVENOUS | Status: DC
Start: 2020-08-19 — End: 2020-08-30

## 2020-08-19 NOTE — ED Triage Notes (Signed)
CBG  435 IV left AC 18g

## 2020-08-19 NOTE — ED Triage Notes (Signed)
Emergency Medicine Provider Triage Evaluation Note  [name] , a 86 y.o. male  was evaluated in triage.  Pt complains of left-sided weakness beginning Thursday evening.  Reports he typically ambulates with the aid of a cane or a walker at baseline but has had difficulty with ambulation due to his left leg "giving out".  Is also noted weakness to the left upper extremity.  Denies difficulty swallowing, vision changes, headaches, numbness.  Has felt a little unsteady with ambulation but denies dizziness or room spinning sensation.  Review of Systems  Positive: Weakness, difficulty ambulating Negative: Fevers, numbness, headaches, vision changes  Physical Exam  BP (!) 162/80 (BP Location: Right Arm)   Pulse 78   Temp 98.6 F (37 C) (Oral)   Resp 18   SpO2 100%  Gen:   Awake, no distress   HEENT:  Atraumatic  Resp:  Normal effort  Cardiac:  Normal rate, 2+ radial pulses bilaterally, 1+ DP/PT pulses bilaterally.  2+ pitting edema of the bilateral lower extremities which patient reports is chronic and at baseline. Abd:   Nondistended, nontender  MSK:   Left-sided weakness Neuro:  Oriented to person place time and events.  Cranial nerves II through XII tested and intact.  Sensation intact to light touch of extremities and face.  Left pronator drift noted.  Left upper and lower extremities weak as compared to the right side.  Medical Decision Making  Medically screening exam initiated at 2:05 PM.  Appropriate orders placed.  Renaye Rakers was informed that the remainder of the evaluation will be completed by another provider, this initial triage assessment does not replace that evaluation, and the importance of remaining in the ED until their evaluation is complete.  Clinical Impression  Clinically concerning for stroke.  However he is unfortunately out of the window for TPA and is VAN negative.  Will obtain imaging and head CT for further evaluation but may require MRI to confirm the presence  of stroke versus other intracranial abnormality.   Renita Papa, PA-C 08/19/20 1409

## 2020-08-19 NOTE — ED Triage Notes (Signed)
EMS stated, left sided coordination problem yesterday. Last normal yesterday. No other problems. Hx TIA

## 2020-08-20 ENCOUNTER — Encounter (HOSPITAL_COMMUNITY): Payer: Self-pay | Admitting: Radiology

## 2020-08-20 ENCOUNTER — Inpatient Hospital Stay (HOSPITAL_COMMUNITY): Payer: HMO

## 2020-08-20 DIAGNOSIS — E1140 Type 2 diabetes mellitus with diabetic neuropathy, unspecified: Secondary | ICD-10-CM | POA: Diagnosis present

## 2020-08-20 DIAGNOSIS — I351 Nonrheumatic aortic (valve) insufficiency: Secondary | ICD-10-CM

## 2020-08-20 DIAGNOSIS — E1151 Type 2 diabetes mellitus with diabetic peripheral angiopathy without gangrene: Secondary | ICD-10-CM | POA: Diagnosis present

## 2020-08-20 DIAGNOSIS — I10 Essential (primary) hypertension: Secondary | ICD-10-CM

## 2020-08-20 DIAGNOSIS — Z794 Long term (current) use of insulin: Secondary | ICD-10-CM | POA: Diagnosis not present

## 2020-08-20 DIAGNOSIS — E039 Hypothyroidism, unspecified: Secondary | ICD-10-CM | POA: Diagnosis present

## 2020-08-20 DIAGNOSIS — M7989 Other specified soft tissue disorders: Secondary | ICD-10-CM | POA: Diagnosis present

## 2020-08-20 DIAGNOSIS — R739 Hyperglycemia, unspecified: Secondary | ICD-10-CM | POA: Diagnosis present

## 2020-08-20 DIAGNOSIS — N184 Chronic kidney disease, stage 4 (severe): Secondary | ICD-10-CM | POA: Diagnosis present

## 2020-08-20 DIAGNOSIS — N179 Acute kidney failure, unspecified: Secondary | ICD-10-CM | POA: Diagnosis not present

## 2020-08-20 DIAGNOSIS — E876 Hypokalemia: Secondary | ICD-10-CM | POA: Diagnosis present

## 2020-08-20 DIAGNOSIS — I639 Cerebral infarction, unspecified: Secondary | ICD-10-CM

## 2020-08-20 DIAGNOSIS — L03114 Cellulitis of left upper limb: Secondary | ICD-10-CM | POA: Diagnosis present

## 2020-08-20 DIAGNOSIS — D696 Thrombocytopenia, unspecified: Secondary | ICD-10-CM | POA: Diagnosis present

## 2020-08-20 DIAGNOSIS — E1122 Type 2 diabetes mellitus with diabetic chronic kidney disease: Secondary | ICD-10-CM | POA: Diagnosis present

## 2020-08-20 DIAGNOSIS — F329 Major depressive disorder, single episode, unspecified: Secondary | ICD-10-CM | POA: Diagnosis present

## 2020-08-20 DIAGNOSIS — E11649 Type 2 diabetes mellitus with hypoglycemia without coma: Secondary | ICD-10-CM | POA: Diagnosis not present

## 2020-08-20 DIAGNOSIS — F32A Depression, unspecified: Secondary | ICD-10-CM | POA: Diagnosis present

## 2020-08-20 DIAGNOSIS — Z20822 Contact with and (suspected) exposure to covid-19: Secondary | ICD-10-CM | POA: Diagnosis present

## 2020-08-20 DIAGNOSIS — I361 Nonrheumatic tricuspid (valve) insufficiency: Secondary | ICD-10-CM | POA: Diagnosis not present

## 2020-08-20 DIAGNOSIS — R296 Repeated falls: Secondary | ICD-10-CM | POA: Diagnosis present

## 2020-08-20 DIAGNOSIS — R29703 NIHSS score 3: Secondary | ICD-10-CM | POA: Diagnosis present

## 2020-08-20 DIAGNOSIS — F0390 Unspecified dementia without behavioral disturbance: Secondary | ICD-10-CM | POA: Diagnosis present

## 2020-08-20 DIAGNOSIS — I350 Nonrheumatic aortic (valve) stenosis: Secondary | ICD-10-CM

## 2020-08-20 DIAGNOSIS — I129 Hypertensive chronic kidney disease with stage 1 through stage 4 chronic kidney disease, or unspecified chronic kidney disease: Secondary | ICD-10-CM | POA: Diagnosis present

## 2020-08-20 DIAGNOSIS — F419 Anxiety disorder, unspecified: Secondary | ICD-10-CM | POA: Diagnosis present

## 2020-08-20 DIAGNOSIS — I6381 Other cerebral infarction due to occlusion or stenosis of small artery: Secondary | ICD-10-CM | POA: Diagnosis present

## 2020-08-20 DIAGNOSIS — E1165 Type 2 diabetes mellitus with hyperglycemia: Secondary | ICD-10-CM | POA: Diagnosis present

## 2020-08-20 DIAGNOSIS — H9193 Unspecified hearing loss, bilateral: Secondary | ICD-10-CM | POA: Diagnosis present

## 2020-08-20 DIAGNOSIS — R29810 Facial weakness: Secondary | ICD-10-CM | POA: Diagnosis present

## 2020-08-20 DIAGNOSIS — N183 Chronic kidney disease, stage 3 unspecified: Secondary | ICD-10-CM | POA: Insufficient documentation

## 2020-08-20 DIAGNOSIS — R471 Dysarthria and anarthria: Secondary | ICD-10-CM | POA: Diagnosis present

## 2020-08-20 DIAGNOSIS — L0390 Cellulitis, unspecified: Secondary | ICD-10-CM | POA: Diagnosis present

## 2020-08-20 DIAGNOSIS — G8194 Hemiplegia, unspecified affecting left nondominant side: Secondary | ICD-10-CM | POA: Diagnosis present

## 2020-08-20 LAB — ECHOCARDIOGRAM COMPLETE
Area-P 1/2: 2.39 cm2
P 1/2 time: 647 msec
S' Lateral: 2.9 cm

## 2020-08-20 LAB — CBC
HCT: 46.8 % (ref 39.0–52.0)
Hemoglobin: 15 g/dL (ref 13.0–17.0)
MCH: 29.3 pg (ref 26.0–34.0)
MCHC: 32.1 g/dL (ref 30.0–36.0)
MCV: 91.4 fL (ref 80.0–100.0)
Platelets: 130 10*3/uL — ABNORMAL LOW (ref 150–400)
RBC: 5.12 MIL/uL (ref 4.22–5.81)
RDW: 14.1 % (ref 11.5–15.5)
WBC: 7.6 10*3/uL (ref 4.0–10.5)
nRBC: 0 % (ref 0.0–0.2)

## 2020-08-20 LAB — URINALYSIS, ROUTINE W REFLEX MICROSCOPIC
Bilirubin Urine: NEGATIVE
Glucose, UA: >=500 mg/dL — AB
Hgb urine dipstick: NEGATIVE
Ketones, ur: NEGATIVE mg/dL
Leukocytes,Ua: NEGATIVE
Nitrite: NEGATIVE
Protein, ur: >=300 mg/dL — AB
Specific Gravity, Urine: 1.017 (ref 1.005–1.030)
pH: 6 (ref 5.0–8.0)

## 2020-08-20 LAB — CREATININE, SERUM
Creatinine, Ser: 2.45 mg/dL — ABNORMAL HIGH (ref 0.61–1.24)
GFR calc Af Amer: 27 mL/min — ABNORMAL LOW (ref >60)
GFR calc non Af Amer: 23 mL/min — ABNORMAL LOW (ref >60)

## 2020-08-20 LAB — SARS CORONAVIRUS 2 BY RT PCR (HOSPITAL ORDER, PERFORMED IN ~~LOC~~ HOSPITAL LAB): SARS Coronavirus 2: NEGATIVE

## 2020-08-20 LAB — CBG MONITORING, ED
Glucose-Capillary: 278 mg/dL — ABNORMAL HIGH (ref 70–99)
Glucose-Capillary: 228 mg/dL — ABNORMAL HIGH (ref 70–99)
Glucose-Capillary: 254 mg/dL — ABNORMAL HIGH (ref 70–99)
Glucose-Capillary: 206 mg/dL — ABNORMAL HIGH (ref 70–99)

## 2020-08-20 LAB — HEMOGLOBIN A1C
Hgb A1c MFr Bld: 10.7 % — ABNORMAL HIGH (ref 4.8–5.6)
Mean Plasma Glucose: 260.39 mg/dL

## 2020-08-20 LAB — TSH: TSH: 4.484 u[IU]/mL (ref 0.350–4.500)

## 2020-08-20 MED ORDER — INSULIN ASPART 100 UNIT/ML ~~LOC~~ SOLN: 0.0000 [IU] | Freq: Every day | SUBCUTANEOUS | Status: DC

## 2020-08-20 MED ORDER — LEVOTHYROXINE SODIUM 75 MCG PO TABS
75.0000 ug | ORAL_TABLET | Freq: Every day | ORAL | Status: DC
  Administered 2020-08-21 – 2020-08-30 (×10): 75 ug via ORAL
  Filled 2020-08-20 (×10): qty 1

## 2020-08-20 MED ORDER — DONEPEZIL HCL 10 MG PO TABS
10.0000 mg | ORAL_TABLET | Freq: Every day | ORAL | Status: DC
  Administered 2020-08-20 – 2020-08-29 (×10): 10 mg via ORAL
  Filled 2020-08-20 (×10): qty 1
  Filled 2020-08-20: qty 2

## 2020-08-20 MED ORDER — STROKE: EARLY STAGES OF RECOVERY BOOK
Freq: Once | Status: AC
  Filled 2020-08-20: qty 1

## 2020-08-20 MED ORDER — ACETAMINOPHEN 650 MG RE SUPP: 650.0000 mg | RECTAL | Status: DC | PRN

## 2020-08-20 MED ORDER — DULOXETINE HCL 30 MG PO CPEP
30.0000 mg | ORAL_CAPSULE | Freq: Every day | ORAL | Status: DC
  Administered 2020-08-20 – 2020-08-29 (×10): 30 mg via ORAL
  Filled 2020-08-20 (×11): qty 1

## 2020-08-20 MED ORDER — INSULIN ASPART 100 UNIT/ML ~~LOC~~ SOLN
0.0000 [IU] | Freq: Every day | SUBCUTANEOUS | Status: DC
  Administered 2020-08-20: 2 [IU] via SUBCUTANEOUS
  Administered 2020-08-23 – 2020-08-24 (×2): 3 [IU] via SUBCUTANEOUS

## 2020-08-20 MED ORDER — ASPIRIN 325 MG PO TABS
325.0000 mg | ORAL_TABLET | Freq: Every day | ORAL | Status: DC
  Administered 2020-08-20: 325 mg via ORAL
  Filled 2020-08-20 (×2): qty 1

## 2020-08-20 MED ORDER — INSULIN ASPART 100 UNIT/ML ~~LOC~~ SOLN
0.0000 [IU] | Freq: Three times a day (TID) | SUBCUTANEOUS | Status: DC
  Administered 2020-08-20: 5 [IU] via SUBCUTANEOUS
  Administered 2020-08-21: 7 [IU] via SUBCUTANEOUS
  Administered 2020-08-21: 2 [IU] via SUBCUTANEOUS
  Administered 2020-08-22 (×3): 1 [IU] via SUBCUTANEOUS

## 2020-08-20 MED ORDER — INSULIN ASPART 100 UNIT/ML ~~LOC~~ SOLN: 0.0000 [IU] | Freq: Three times a day (TID) | SUBCUTANEOUS | Status: DC

## 2020-08-20 MED ORDER — ASPIRIN 300 MG RE SUPP: 300.0000 mg | Freq: Every day | RECTAL | Status: DC

## 2020-08-20 MED ORDER — ENOXAPARIN SODIUM 30 MG/0.3ML ~~LOC~~ SOLN
30.0000 mg | Freq: Every day | SUBCUTANEOUS | Status: DC
  Administered 2020-08-20 – 2020-08-30 (×11): 30 mg via SUBCUTANEOUS
  Filled 2020-08-20 (×11): qty 0.3

## 2020-08-20 MED ORDER — ACETAMINOPHEN 325 MG PO TABS
650.0000 mg | ORAL_TABLET | ORAL | Status: DC | PRN
  Filled 2020-08-20: qty 2

## 2020-08-20 MED ORDER — SERTRALINE HCL 50 MG PO TABS
50.0000 mg | ORAL_TABLET | Freq: Every day | ORAL | Status: DC
  Administered 2020-08-20 – 2020-08-30 (×11): 50 mg via ORAL
  Filled 2020-08-20 (×13): qty 1

## 2020-08-20 MED ORDER — SODIUM CHLORIDE 0.9 % IV BOLUS
1000.0000 mL | Freq: Once | INTRAVENOUS | Status: AC
  Administered 2020-08-20: 1000 mL via INTRAVENOUS

## 2020-08-20 MED ORDER — INSULIN ASPART 100 UNIT/ML ~~LOC~~ SOLN: 12.0000 [IU] | Freq: Once | SUBCUTANEOUS | Status: DC

## 2020-08-20 MED ORDER — SODIUM CHLORIDE 0.9 % IV SOLN
1.0000 g | INTRAVENOUS | Status: DC
  Administered 2020-08-20 – 2020-08-21 (×2): 1 g via INTRAVENOUS
  Filled 2020-08-20 (×2): qty 10

## 2020-08-20 MED ORDER — ATORVASTATIN CALCIUM 40 MG PO TABS
40.0000 mg | ORAL_TABLET | Freq: Every evening | ORAL | Status: DC
  Administered 2020-08-20 – 2020-08-29 (×10): 40 mg via ORAL
  Filled 2020-08-20 (×11): qty 1

## 2020-08-20 MED ORDER — ACETAMINOPHEN 160 MG/5ML PO SOLN: 650.0000 mg | ORAL | Status: DC | PRN

## 2020-08-20 MED ORDER — INSULIN GLARGINE 100 UNIT/ML ~~LOC~~ SOLN
25.0000 [IU] | Freq: Two times a day (BID) | SUBCUTANEOUS | Status: DC
  Administered 2020-08-20 – 2020-08-22 (×6): 25 [IU] via SUBCUTANEOUS
  Filled 2020-08-20 (×8): qty 0.25

## 2020-08-20 MED ORDER — SODIUM CHLORIDE 0.9 % IV SOLN: 1.0000 g | INTRAVENOUS | Status: DC

## 2020-08-20 MED ORDER — CEFAZOLIN SODIUM-DEXTROSE 1-4 GM/50ML-% IV SOLN
1.0000 g | Freq: Once | INTRAVENOUS | Status: AC
  Administered 2020-08-20: 1 g via INTRAVENOUS
  Filled 2020-08-20: qty 50

## 2020-08-20 NOTE — Consult Note (Addendum)
NEURO HOSPITALIST  CONSULT   Requesting Physician: Dr. Doristine Bosworth    Chief Complaint: left side weakness  History obtained from:  Patient  / son  HPI:                                                                                                                                         [name] is an 86 y.o. male  With PMH  DM, HTN, CAD, diabetic neuropathy who presented to ED with 2 day history of left side weakness and gait instability.  Per patient he went to bed Thursday night and woke up Friday morning 9/10 and felt abnormal.  He has some weakness. His uses a walker to ambulate at baseline, but he felt unsteady with the walker and fell 2 times on Friday and 1-2 times on Saturday. Denies hitting head or LOC.  On Saturday afternoon his son noticed the weakness and a facial droop on the left arm and leg and called EMS. Patient endorses daily ASA 81 mg and missing doses of medication. Denies any ETOH, drug use or smoking, vision changes, numbness, tingling. No prior stroke history noted.   ED course:  CTH: small focus of ill-defined hypoattenuation within the right thalamocapsular junction. MRI: acute/subacute 70mm infarct lateral right thalamus and internal capsule with small focus of central petechial hemorrhage.  BP: 162/80 BG:278 HgBA1c: 10.7 creatinine: 2.45  Date last known well: 08/17/10 Time last known well: bedtime tPA Given: no outside of window Modified Rankin: Rankin Score=2 NIHSS: 3   Past Medical History:  Diagnosis Date   Bilateral foot-drop 01/12/2020   Coronary artery disease    Diabetes mellitus without complication ([place])    Diabetic neuropathy ([place]) 09/16/2017   Mild bilateral foot drops   Gait abnormality 09/16/2017   Hyperlipidemia    Hypertension    Memory difficulty 03/06/2017   Toxic goiter     Past Surgical History:  Procedure Laterality Date   ABDOMINAL AORTIC ANEURYSM REPAIR     cardiac bypass      CORONARY ARTERY BYPASS GRAFT      Family History  Problem Relation Age of Onset   Stroke Mother    Stroke Father    Heart attack Father    Diabetes Sister    Heart attack Sister    Diabetes Brother    Heart attack Brother    Diabetes Brother    Diabetes Brother    Heart attack Brother          Social History:  reports that he quit smoking about 24 years ago. He smoked 1.00 pack per day. He has never used  smokeless tobacco. He reports that he does not drink alcohol and does not use drugs.  Allergies: No Known Allergies  Medications:                                                                                                                           Current Facility-Administered Medications  Medication Dose Route Frequency Provider Last Rate Last Admin   acetaminophen (TYLENOL) tablet 650 mg  650 mg Oral Q4H PRN Pahwani, Rinka R, MD       Or   acetaminophen (TYLENOL) 160 MG/5ML solution 650 mg  650 mg Per Tube Q4H PRN Pahwani, Rinka R, MD       Or   acetaminophen (TYLENOL) suppository 650 mg  650 mg Rectal Q4H PRN Pahwani, Rinka R, MD       aspirin suppository 300 mg  300 mg Rectal Daily Pahwani, Rinka R, MD       Or   aspirin tablet 325 mg  325 mg Oral Daily Pahwani, Rinka R, MD       ceFAZolin (ANCEF) IVPB 1 g/50 mL premix  1 g Intravenous Once Lajean Saver, MD 100 mL/hr at 08/20/20 0858 1 g at 08/20/20 0858   enoxaparin (LOVENOX) injection 30 mg  30 mg Subcutaneous Daily Pahwani, Rinka R, MD       insulin aspart (novoLOG) injection 0-5 Units  0-5 Units Subcutaneous QHS Pahwani, Rinka R, MD       insulin aspart (novoLOG) injection 0-9 Units  0-9 Units Subcutaneous TID WC Pahwani, Rinka R, MD       sodium chloride flush (NS) 0.9 % injection 3 mL  3 mL Intravenous Once Lajean Saver, MD       Current Outpatient Medications  Medication Sig Dispense Refill   acetaminophen (TYLENOL) 325 MG tablet Take 650 mg by mouth every 6 (six) hours as needed for mild pain, fever or  headache.     aspirin EC 81 MG tablet Take 81 mg by mouth daily.     atorvastatin (LIPITOR) 40 MG tablet Take 40 mg by mouth every evening.      calcitRIOL (ROCALTROL) 0.25 MCG capsule Take 0.5 mcg by mouth daily.     donepezil (ARICEPT) 10 MG tablet TAKE 1 TABLET BY MOUTH ONCE DAILY AT BEDTIME 90 tablet 0   DULoxetine (CYMBALTA) 30 MG capsule Take 1 capsule (30 mg total) by mouth at bedtime. 30 capsule 3   furosemide (LASIX) 80 MG tablet Take 160 mg by mouth daily.      levothyroxine (SYNTHROID, LEVOTHROID) 75 MCG tablet Take 75 mcg by mouth daily before breakfast.     loratadine (CLARITIN) 10 MG tablet Take 10 mg by mouth daily as needed for allergies.      metoprolol succinate (TOPROL-XL) 25 MG 24 hr tablet Take 1 tablet (25 mg total) by mouth daily. Please keep upcoming appt in April with Dr. Tamala Julian before anymore refills. Thank you 90 tablet 0   Multiple Vitamins-Minerals (  MULTIVITAMIN ADULTS PO) Take 1 tablet by mouth daily.     niacin (NIASPAN) 1000 MG CR tablet Take 1,000 mg by mouth 2 (two) times daily.     NOVOLIN N RELION 100 UNIT/ML injection Inject into the skin 2 (two) times daily before a meal. Sliding scale: 30-40     NOVOLIN R RELION 100 UNIT/ML injection Inject into the skin 3 (three) times daily before meals. Sliding scale: 30-40     ONETOUCH VERIO test strip      potassium chloride (K-DUR) 10 MEQ tablet Take 10 mEq by mouth 2 (two) times daily with a meal.  3   RELION INSULIN SYRINGE 1ML/31G 31G X 5/16" 1 ML MISC USE AS DIRECTED FIVE TIMES DAILY     sertraline (ZOLOFT) 50 MG tablet Take 50 mg by mouth daily.     ROS:                                                                                                                                       ROS was performed and is negative except as noted in HPI  General Examination:                                                                                                      Blood pressure (!) 145/72, pulse 62, temperature  98.2 F (36.8 C), temperature source Oral, resp. rate 20, SpO2 96 %.  Physical Exam  Constitutional: Appears well-developed and well-nourished.  Psych: Affect appropriate to situation Eyes: Normal external eye and conjunctiva. HENT: Normocephalic, no lesions, without obvious abnormality.   Musculoskeletal-no joint tenderness, deformity . Swelling BLE Cardiovascular: Normal rate and regular rhythm.  Respiratory: Effort normal, non-labored breathing saturations WNL GI: Soft.  No distension. There is no tenderness.  Skin: WDI, BLE with erythema, and on left elbow.  Neurological Examination Mental Status: Alert, oriented, thought content appropriate. Mild dysarthria. Speech fluent without evidence of aphasia.  Able to follow commands without difficulty. Cranial Nerves: II:  Visual fields grossly normal,  III,IV, VI: ptosis not present, extra-ocular motions intact bilaterally, pupils equal, round, reactive to light and accommodation V,VII: smile asymmetric, left droop, facial light touch sensation normal bilaterally VIII: hearing normal bilaterally IX,X: uvula rises midline XI: bilateral shoulder shrug XII: midline tongue extension Motor: Right : Upper extremity   5/5  Left:     Upper extremity   4-/5  Lower extremity   5/5   Lower extremity   4-/5 Tone and bulk:normal tone  throughout; no atrophy noted Sensory:  light touch intact throughout, bilaterally Plantars: Right: downgoing   Left: downgoing Cerebellar: normal finger-to-nose, and HTS on right,  No gross ataxia. Gait: deferred   Lab Results: Basic Metabolic Panel: Recent Labs  Lab 08/19/20 1419  NA 136  K 3.8  CL 103  CO2 24  GLUCOSE 471*  BUN 38*  CREATININE 2.54*  CALCIUM 9.5    CBC: Recent Labs  Lab 08/19/20 1419  WBC 8.5  NEUTROABS 6.9  HGB 15.4  HCT 47.5  MCV 88.8  PLT 140*    CBG: Recent Labs  Lab 08/20/20 0823  GLUCAP 278*    Imaging: CT Head Wo Contrast  Result Date:  08/19/2020 CLINICAL DATA:  Loss of coordination. Additional provided: Left-sided coordination problem yesterday, last known normal yesterday. EXAM: CT HEAD WITHOUT CONTRAST TECHNIQUE: Contiguous axial images were obtained from the base of the skull through the vertex without intravenous contrast. COMPARISON:  Head CT 03/18/2020. FINDINGS: Brain: Stable, mild generalized parenchymal atrophy. There is a small focus of ill-defined hypoattenuation within the right thalamocapsular junction which is new as compared to the prior head CT of 03/18/2020 and likely reflecting an acute/early subacute lacunar infarct given the provided history (series 3, image 17). Redemonstrated chronic lacunar infarcts within the right basal ganglia and right internal capsule. Stable background mild ill-defined hypoattenuation within the cerebral white matter which is nonspecific, but consistent with chronic small vessel ischemic disease. There is no acute intracranial hemorrhage. No demarcated cortical infarct is identified. No extra-axial fluid collection. No evidence of intracranial mass. No midline shift. Vascular: No hyperdense vessel.  Atherosclerotic calcifications. Skull: Normal. Negative for fracture or focal lesion. Sinuses/Orbits: Visualized orbits show no acute finding. Small amount of frothy secretions within a posterior left ethmoid air cell. Minimal ethmoid and left maxillary sinus mucosal thickening at the imaged levels. Small left mastoid effusion. IMPRESSION: Small focus of ill-defined hypoattenuation within the right thalamocapsular junction which is new as compared to the prior head CT of 03/18/2020 and likely reflects an acute/early subacute lacunar infarct given the provided history. Stable background mild generalized parenchymal atrophy and chronic small vessel ischemic disease. Redemonstrated chronic lacunar infarcts within the right basal ganglia and right internal capsule. Right ethmoid sinusitis. Background mild  scattered paranasal sinus mucosal thickening. Small left mastoid effusion. Electronically Signed   By: Kellie Simmering DO   On: 08/19/2020 14:55       Laurey Morale, MSN, NP-C [facility] [phone]  08/20/2020, 9:21 AM   Attending physician note to follow with Assessment and plan .   Assessment: 86 y.o. male with PMH DM (with neuropathy), HTN, CAD who presented to ED with 2 day history of left side weakness. Patient was not a candidate for TPA d/t presenting outside of the window. The [place] did not not show a hemorrhage, but did show a hypodensity concerning for stroke - MRI confirmed a CVA in the right thalamus and internal capsule - likley small vessel etiology lacunar infarct. Full stroke work-up needed.  Stroke Risk Factors - diabetes mellitus and hypertension    Recommendations: -- BP goal : Permissive HTN upto 220/110 mmHg (for 24-48 post admission ) goal is to normalize BP < 140/90 by discharge. --MRA of the head  --carotid US --Echocardiogram -- ASA 325 -- High intensity Statin if LDL > 70 -- HgbA1c, fasting lipid panel -- PT consult, OT consult, Speech consult --Telemetry monitoring --Frequent neuro checks --Stroke swallow screen  --Please page the Stroke team from 8am-4pm.  You can look them up on [email or web address]    Attending Neurohospitalist Addendum Patient seen and examined with APP/Resident. Agree with the history and physical as documented above. Agree with the plan as documented, which I helped formulate. I have independently reviewed the chart, obtained history, review of systems and examined the patient.I have personally reviewed pertinent head/neck/spine imaging (CT/MRI). Please feel free to call with any questions. --- Amie Portland, MD [facility] Pager: [phone]  If 7pm to 7am, please call on call as listed on AMION.

## 2020-08-20 NOTE — ED Notes (Signed)
Pt completed all of meal tray

## 2020-08-20 NOTE — Progress Notes (Signed)
Carotid duplex bilateral study completed.   Please see CV Proc for preliminary results.   Darlin Coco

## 2020-08-20 NOTE — ED Notes (Signed)
Patient returned from to MRI

## 2020-08-20 NOTE — ED Notes (Signed)
Patient returned from MRI.

## 2020-08-20 NOTE — H&P (Addendum)
History and Physical    [name] VZC:[id number] DOB: Mar 02, 1934 DOA: 08/19/2020  PCP: Hulan Fess, MD  Patient coming from: home  I have personally briefly reviewed patient's old medical records in [place]  Chief Complaint: recurrent falls and left sided weakness & rash on left arm  HPI: [name] is a 86 y.o. male with medical history significant of coronary artery disease status post CABG, hypertension, hyperlipidemia, type 2 diabetes mellitus, CKD stage IIIb, hypothyroidism, depression/anxiety, memory difficulty, chronic bilateral leg swelling, AAA status post repair presents to emergency department with recurrent falls and left-sided weakness started 2 days ago.  Rash on left arm which he noticed yesterday.  Patient reports left-sided weakness which started on Thursday evening.  He typically ambulates with cane at baseline and fell 3 times on Thursday and 2 times on Friday.  Reports difficulty with ambulation due to his left leg giving out.  He noticed rash on his left arm yesterday.  He denies pain/itching/fever or chills.  Reports mild headache and dysarthria however denies blurry vision, lightheadedness, dizziness, facial droop, head trauma, loss of consciousness, seizures, chest pain, shortness of breath, palpitation, cough, congestion, nausea, vomiting, abdominal pain, urinary or bowel changes.  He lives with his wife at home.  No history of smoking, alcohol, illicit drug use.  ED Course: Upon arrival to ED: Patient's blood pressure noted to be elevated, afebrile with no leukocytosis, maintaining oxygen saturation on room air, platelet: 140,-chronic, CMP shows blood glucose: 471, CKD stage IV-chronic, PT/INR: WNL, CT head shows small focus of ill-defined hypoattenuation within the right thalamus capsular junction which is new as compared to the prior head CT and likely reflects an acute/early subacute lacunar infarct.  Patient was given NovoLog 12 units once, cefazolin  IV once in ED.  Neurology consulted.  [facility] hospitalist consulted for admission for stroke work-up.  Review of Systems: As per HPI otherwise negative.    Past Medical History:  Diagnosis Date  . Bilateral foot-drop 01/12/2020  . Coronary artery disease   . Diabetes mellitus without complication ([place])   . Diabetic neuropathy ([place]) 09/16/2017   Mild bilateral foot drops  . Gait abnormality 09/16/2017  . Hyperlipidemia   . Hypertension   . Memory difficulty 03/06/2017  . Toxic goiter     Past Surgical History:  Procedure Laterality Date  . ABDOMINAL AORTIC ANEURYSM REPAIR    . cardiac bypass    . CORONARY ARTERY BYPASS GRAFT       reports that he quit smoking about 24 years ago. He smoked 1.00 pack per day. He has never used smokeless tobacco. He reports that he does not drink alcohol and does not use drugs.  No Known Allergies  Family History  Problem Relation Age of Onset  . Stroke Mother   . Stroke Father   . Heart attack Father   . Diabetes Sister   . Heart attack Sister   . Diabetes Brother   . Heart attack Brother   . Diabetes Brother   . Diabetes Brother   . Heart attack Brother     Prior to Admission medications   Medication Sig Start Date End Date Taking? Authorizing Provider  acetaminophen (TYLENOL) 325 MG tablet Take 650 mg by mouth every 6 (six) hours as needed for mild pain, fever or headache.    [provider]  aspirin EC 81 MG tablet Take 81 mg by mouth daily.    [provider]  atorvastatin (LIPITOR) 40 MG tablet  Take 40 mg by mouth every evening.     [provider]  calcitRIOL (ROCALTROL) 0.25 MCG capsule Take 0.5 mcg by mouth daily. 04/29/17   [provider]  donepezil (ARICEPT) 10 MG tablet TAKE 1 TABLET BY MOUTH ONCE DAILY AT BEDTIME 01/10/20   Kathrynn Ducking, MD  DULoxetine (CYMBALTA) 30 MG capsule Take 1 capsule (30 mg total) by mouth at bedtime. 01/12/20   Kathrynn Ducking, MD  furosemide (LASIX) 80 MG tablet  Take 160 mg by mouth daily.  05/18/17   [provider]  levothyroxine (SYNTHROID, LEVOTHROID) 75 MCG tablet Take 75 mcg by mouth daily before breakfast.    [provider]  loratadine (CLARITIN) 10 MG tablet Take 10 mg by mouth daily as needed for allergies.     [provider]  metoprolol succinate (TOPROL-XL) 25 MG 24 hr tablet Take 1 tablet (25 mg total) by mouth daily. Please keep upcoming appt in April with Dr. Tamala Julian before anymore refills. Thank you 03/20/20   Belva Crome, MD  Multiple Vitamins-Minerals (MULTIVITAMIN ADULTS PO) Take 1 tablet by mouth daily.    [provider]  niacin (NIASPAN) 1000 MG CR tablet Take 1,000 mg by mouth 2 (two) times daily.    [provider]  NOVOLIN N RELION 100 UNIT/ML injection Inject into the skin 2 (two) times daily before a meal. Sliding scale: 30-40 12/29/18   [provider]  NOVOLIN R RELION 100 UNIT/ML injection Inject into the skin 3 (three) times daily before meals. Sliding scale: 30-40 12/29/18   [provider]  [facility] VERIO test strip  01/12/19   [provider]  potassium chloride (K-DUR) 10 MEQ tablet Take 10 mEq by mouth 2 (two) times daily with a meal. 09/25/18   [provider]  [place] 1ML/31G 31G X 5/16" 1 ML MISC USE AS DIRECTED FIVE TIMES DAILY 12/10/19   [provider]  sertraline (ZOLOFT) 50 MG tablet Take 50 mg by mouth daily.    [provider]    Physical Exam: Vitals:   08/19/20 1819 08/19/20 2324 08/20/20 0427 08/20/20 0814  BP: (!) 150/87 (!) 148/83 (!) 185/104 (!) 145/72  Pulse: 85 77 68 62  Resp: 16 18 14 20   Temp: 98.3 F (36.8 C) 98.4 F (36.9 C) 98.2 F (36.8 C)   TempSrc: Oral Oral Oral   SpO2: 99% 97% 100% 96%    Constitutional: NAD, calm, comfortable, on room air, communicating well Eyes: PERRL, lids and conjunctivae normal ENMT: Mucous membranes are moist. Posterior pharynx clear of any exudate or  lesions.Normal dentition.  Neck: normal, supple, no masses, no thyromegaly Respiratory: clear to auscultation bilaterally, no wheezing, no crackles. Normal respiratory effort. No accessory muscle use.  Cardiovascular: Regular rate and rhythm, no murmurs / rubs / gallops.  Bilateral pitting edema positive, left more than right.. 2+ pedal pulses. No carotid bruits.  Abdomen: no tenderness, no masses palpated. No hepatosplenomegaly. Bowel sounds positive.  Musculoskeletal: no clubbing / cyanosis. No joint deformity upper and lower extremities. Good ROM, no contractures. Normal muscle tone.  Skin:    Neurologic: Mild dysarthria noted.  Power 5 out of 5 in right upper and lower extremity, 3 out of 5 in left upper extremity, 2 out of 5 in left lower extremity.  Sensation intact.   Psychiatric: Normal judgment and insight. Alert and oriented x 3. Normal mood.    Labs on Admission: I have personally reviewed following labs and imaging  studies  CBC: Recent Labs  Lab 08/19/20 1419  WBC 8.5  NEUTROABS 6.9  HGB 15.4  HCT 47.5  MCV 88.8  PLT 829*   Basic Metabolic Panel: Recent Labs  Lab 08/19/20 1419  NA 136  K 3.8  CL 103  CO2 24  GLUCOSE 471*  BUN 38*  CREATININE 2.54*  CALCIUM 9.5   GFR: CrCl cannot be calculated (Unknown ideal weight.). Liver Function Tests: Recent Labs  Lab 08/19/20 1419  AST 18  ALT 19  ALKPHOS 76  BILITOT 0.8  PROT 5.8*  ALBUMIN 3.3*   No results for input(s): LIPASE, AMYLASE in the last 168 hours. No results for input(s): AMMONIA in the last 168 hours. Coagulation Profile: Recent Labs  Lab 08/19/20 1419  INR 1.1   Cardiac Enzymes: No results for input(s): CKTOTAL, CKMB, CKMBINDEX, TROPONINI in the last 168 hours. BNP (last 3 results) No results for input(s): PROBNP in the last 8760 hours. HbA1C: No results for input(s): HGBA1C in the last 72 hours. CBG: Recent Labs  Lab 08/20/20 0823  GLUCAP 278*   Lipid Profile: No results for  input(s): CHOL, HDL, LDLCALC, TRIG, CHOLHDL, LDLDIRECT in the last 72 hours. Thyroid Function Tests: No results for input(s): TSH, T4TOTAL, FREET4, T3FREE, THYROIDAB in the last 72 hours. Anemia Panel: No results for input(s): VITAMINB12, FOLATE, FERRITIN, TIBC, IRON, RETICCTPCT in the last 72 hours. Urine analysis:    Component Value Date/Time   COLORURINE YELLOW 12/10/2019 [place] 12/10/2019 1354   LABSPEC 1.014 12/10/2019 1354   PHURINE 6.0 12/10/2019 1354   GLUCOSEU >=500 (A) 12/10/2019 1354   HGBUR SMALL (A) 12/10/2019 1354   BILIRUBINUR NEGATIVE 12/10/2019 [place] 12/10/2019 1354   PROTEINUR >=300 (A) 12/10/2019 1354   NITRITE NEGATIVE 12/10/2019 1354   LEUKOCYTESUR NEGATIVE 12/10/2019 1354    Radiological Exams on Admission: CT Head Wo Contrast  Result Date: 08/19/2020 CLINICAL DATA:  Loss of coordination. Additional provided: Left-sided coordination problem yesterday, last known normal yesterday. EXAM: CT HEAD WITHOUT CONTRAST TECHNIQUE: Contiguous axial images were obtained from the base of the skull through the vertex without intravenous contrast. COMPARISON:  Head CT 03/18/2020. FINDINGS: Brain: Stable, mild generalized parenchymal atrophy. There is a small focus of ill-defined hypoattenuation within the right thalamocapsular junction which is new as compared to the prior head CT of 03/18/2020 and likely reflecting an acute/early subacute lacunar infarct given the provided history (series 3, image 17). Redemonstrated chronic lacunar infarcts within the right basal ganglia and right internal capsule. Stable background mild ill-defined hypoattenuation within the cerebral white matter which is nonspecific, but consistent with chronic small vessel ischemic disease. There is no acute intracranial hemorrhage. No demarcated cortical infarct is identified. No extra-axial fluid collection. No evidence of intracranial mass. No midline shift. Vascular: No  hyperdense vessel.  Atherosclerotic calcifications. Skull: Normal. Negative for fracture or focal lesion. Sinuses/Orbits: Visualized orbits show no acute finding. Small amount of frothy secretions within a posterior left ethmoid air cell. Minimal ethmoid and left maxillary sinus mucosal thickening at the imaged levels. Small left mastoid effusion. IMPRESSION: Small focus of ill-defined hypoattenuation within the right thalamocapsular junction which is new as compared to the prior head CT of 03/18/2020 and likely reflects an acute/early subacute lacunar infarct given the provided history. Stable background mild generalized parenchymal atrophy and chronic small vessel ischemic disease. Redemonstrated chronic lacunar infarcts within the right basal ganglia and right internal capsule. Right ethmoid sinusitis. Background mild scattered paranasal sinus  mucosal thickening. Small left mastoid effusion. Electronically Signed   By: Kellie Simmering DO   On: 08/19/2020 14:55    EKG: Independently reviewed.  Normal sinus rhythm.  No ST elevation or depression noted.  Assessment/Plan Principal Problem:   Ischemic stroke [facility]) Active Problems:   Coronary artery disease   Essential hypertension   Hyperlipidemia   DM (diabetes mellitus) ([facility])   Chronic kidney disease, stage IV (severe) ([facility])   Thrombocytopenia ([facility])   Hypothyroidism   Dementia (H[facility])   Depression   Cellulitis    Ischemic stroke: -Patient presented with left-sided weakness and recurrent falls.  Reviewed CT head. -admit forTelemetry monitoring -Stroke protocol -Allow for permissive hypertension for the first 24-48h - only treat PRN if SBP >202 mmHg or diastolic blood pressure >542. Blood pressures can be gradually normalized to SBP<140 upon discharge. -Ordered MRI brain without contrast, transthoracic echo -ASA given, continue atorvastatin -Check lipid Panel, TSH and A1C -Frequent neuro checks -Risk factor modification -Consult  Neurology -PT/OT eval, Speech consult -On fall/aspiration precautions.  Cellulitis: Left elbow -Received Ancef in ED.  Patient is afebrile with no leukocytosis. -We will start patient on IV Rocephin  Hypertension: Blood pressure elevated upon arrival: -Hold metoprolol to allow permissive hypertension.  Monitor blood pressure closely  Type 2 diabetes mellitus: Check A1c -Patient's blood sugar elevated upon arrival.  12 units of NovoLog given in ED. -Continue home Lantus, started patient on sliding scale insulin and monitor blood sugar closely.  Hyperlipidemia: Check lipid panel -Continue atorvastatin  CKD stage IV: At baseline -Continue to monitor  Hypothyroidism: Check TSH -Continue levothyroxine  Coronary artery disease status post CABG: Stable -Patient denies ACS symptoms. -Continue aspirin, statin, hold metoprolol.  Depression/anxiety: Continue Zoloft, Cymbalta  Memory problems/dementia: Continue Aricept  Chronic bilateral lower leg swelling: -L>R-likely dependent edema -Hold Lasix for now. -Leg elevation   DVT prophylaxis: Lovenox/scd Code Status: Full code Family Communication: Patient's son present at bedside.  Plan of care discussed with patient in length and he verbalized understanding and agreed with it. Disposition Plan: Likely rehab in 1 to 2 days Consults called: Neurology by EDP  admission status: Inpatient   Mckinley Jewel MD [facility]  If 7PM-7AM, please contact night-coverage [email or web address] Password [email or web address]  08/20/2020, 9:00 AM

## 2020-08-20 NOTE — ED Notes (Signed)
Patient transported to MRI 

## 2020-08-20 NOTE — ED Provider Notes (Signed)
[facility] EMERGENCY DEPARTMENT Provider Note   CSN: [id number] Arrival date & time: 08/19/20  1347     History Chief Complaint  Patient presents with  . Cerebrovascular Accident    last normal  2 days ago    CARREL LEATHER is a 86 y.o. male.  Patient with hx dm, htn, presents with acute onset left side weakness, balance/gait unsteadiness, and mild dysarthria for the past 2 days. Symptoms acute onset, moderate, persistent, constant, without acute or abrupt worsening since initial onset Friday AM (last felt normal, Thursday before bed). States seems unsteady when up due to left weakness, has caused him to fall but denies injury. No loc. No headache. No neck or back pain. Denies chest pain or sob. No abd pain or nv. In past day, also notes mildly sore, red, warm patch of skin to left arm around elbow/forearm area, denies hx same. No fever or chills.   The history is provided by the patient and a relative.  Cerebrovascular Accident Pertinent negatives include no chest pain, no abdominal pain, no headaches and no shortness of breath.       Past Medical History:  Diagnosis Date  . Bilateral foot-drop 01/12/2020  . Coronary artery disease   . Diabetes mellitus without complication ([place])   . Diabetic neuropathy ([place]) 09/16/2017   Mild bilateral foot drops  . Gait abnormality 09/16/2017  . Hyperlipidemia   . Hypertension   . Memory difficulty 03/06/2017  . Toxic goiter     Patient Active Problem List   Diagnosis Date Noted  . Bilateral foot-drop 01/12/2020  . Diabetic neuropathy ([place]) 09/16/2017  . Gait abnormality 09/16/2017  . Memory difficulty 03/06/2017  . GIB (gastrointestinal bleeding) 03/31/2016  . Rectal bleeding   . Chronic kidney disease, stage IV (severe) ([place]) 03/23/2015  . CAD (coronary artery disease) of artery bypass graft 03/22/2014  . Essential hypertension 03/22/2014  . Hyperlipidemia 03/22/2014  . Multinodular goiter 03/22/2014  . Diabetes  mellitus due to underlying condition with diabetic nephropathy ([place]) 03/22/2014  . Abdominal aortic aneurysm ([place]) 03/04/2012    Past Surgical History:  Procedure Laterality Date  . ABDOMINAL AORTIC ANEURYSM REPAIR    . cardiac bypass    . CORONARY ARTERY BYPASS GRAFT         Family History  Problem Relation Age of Onset  . Stroke Mother   . Stroke Father   . Heart attack Father   . Diabetes Sister   . Heart attack Sister   . Diabetes Brother   . Heart attack Brother   . Diabetes Brother   . Diabetes Brother   . Heart attack Brother     Social History   Tobacco Use  . Smoking status: Former Smoker    Packs/day: 1.00    Quit date: 03/31/1996    Years since quitting: 24.4  . Smokeless tobacco: Never Used  Vaping Use  . Vaping Use: Never used  Substance Use Topics  . Alcohol use: No  . Drug use: No    Home Medications Prior to Admission medications   Medication Sig Start Date End Date Taking? Authorizing Provider  acetaminophen (TYLENOL) 325 MG tablet Take 650 mg by mouth every 6 (six) hours as needed for mild pain, fever or headache.    [provider]  aspirin EC 81 MG tablet Take 81 mg by mouth daily.    [provider]  atorvastatin (LIPITOR) 40 MG tablet Take 40 mg by mouth every evening.  [provider]  calcitRIOL (ROCALTROL) 0.25 MCG capsule Take 0.5 mcg by mouth daily. 04/29/17   [provider]  donepezil (ARICEPT) 10 MG tablet TAKE 1 TABLET BY MOUTH ONCE DAILY AT BEDTIME 01/10/20   Kathrynn Ducking, MD  DULoxetine (CYMBALTA) 30 MG capsule Take 1 capsule (30 mg total) by mouth at bedtime. 01/12/20   Kathrynn Ducking, MD  furosemide (LASIX) 80 MG tablet Take 160 mg by mouth daily.  05/18/17   [provider]  levothyroxine (SYNTHROID, LEVOTHROID) 75 MCG tablet Take 75 mcg by mouth daily before breakfast.    [provider]  loratadine (CLARITIN) 10 MG tablet Take 10 mg by mouth daily as needed for  allergies.     [provider]  metoprolol succinate (TOPROL-XL) 25 MG 24 hr tablet Take 1 tablet (25 mg total) by mouth daily. Please keep upcoming appt in April with Dr. Tamala Julian before anymore refills. Thank you 03/20/20   Belva Crome, MD  Multiple Vitamins-Minerals (MULTIVITAMIN ADULTS PO) Take 1 tablet by mouth daily.    [provider]  niacin (NIASPAN) 1000 MG CR tablet Take 1,000 mg by mouth 2 (two) times daily.    [provider]  NOVOLIN N RELION 100 UNIT/ML injection Inject into the skin 2 (two) times daily before a meal. Sliding scale: 30-40 12/29/18   [provider]  NOVOLIN R RELION 100 UNIT/ML injection Inject into the skin 3 (three) times daily before meals. Sliding scale: 30-40 12/29/18   [provider]  [facility] VERIO test strip  01/12/19   [provider]  potassium chloride (K-DUR) 10 MEQ tablet Take 10 mEq by mouth 2 (two) times daily with a meal. 09/25/18   [provider]  Roundup 1ML/31G 31G X 5/16" 1 ML MISC USE AS DIRECTED FIVE TIMES DAILY 12/10/19   [provider]  sertraline (ZOLOFT) 50 MG tablet Take 50 mg by mouth daily.    [provider]    Allergies    Patient has no known allergies.  Review of Systems   Review of Systems  Constitutional: Negative for fever.  HENT: Negative for trouble swallowing.   Eyes: Negative for visual disturbance.  Respiratory: Negative for cough and shortness of breath.   Cardiovascular: Negative for chest pain.  Gastrointestinal: Negative for abdominal pain, nausea and vomiting.  Endocrine: Negative for polyuria.  Genitourinary: Negative for dysuria and flank pain.  Musculoskeletal: Negative for back pain and neck pain.  Skin: Negative for rash.  Neurological: Positive for weakness. Negative for headaches.  Hematological: Does not bruise/bleed easily.  Psychiatric/Behavioral: Negative for confusion.    Physical Exam Updated Vital  Signs BP (!) 185/104 (BP Location: Right Arm)   Pulse 68   Temp 98.2 F (36.8 C) (Oral)   Resp 14   SpO2 100%   Physical Exam Vitals and nursing note reviewed.  Constitutional:      Appearance: Normal appearance. He is well-developed.  HENT:     Head: Atraumatic.     Nose: Nose normal.     Mouth/Throat:     Mouth: Mucous membranes are moist.     Pharynx: Oropharynx is clear.  Eyes:     General: No scleral icterus.    Conjunctiva/sclera: Conjunctivae normal.     Pupils: Pupils are equal, round, and reactive to light.  Neck:     Vascular: No carotid bruit.     Trachea: No tracheal deviation.  Cardiovascular:     Rate and  Rhythm: Normal rate and regular rhythm.     Pulses: Normal pulses.     Heart sounds: Normal heart sounds. No murmur heard.  No friction rub. No gallop.   Pulmonary:     Effort: Pulmonary effort is normal. No accessory muscle usage or respiratory distress.     Breath sounds: Normal breath sounds.  Abdominal:     General: Bowel sounds are normal. There is no distension.     Palpations: Abdomen is soft.     Tenderness: There is no abdominal tenderness. There is no guarding.  Genitourinary:    Comments: No cva tenderness. Musculoskeletal:     Cervical back: Normal range of motion and neck supple. No rigidity.     Comments: CTLS spine, non tender, aligned, no step off.   Skin:    General: Skin is warm and dry.     Findings: No rash.     Comments: Area of erythema and increased warmth to left forearm and elbow area c/w cellulitis (dry scaly, skin to area). No abscess. No pain w passive rom at elbow.   Neurological:     Mental Status: He is alert.     Comments: Alert, speech clear. Pronator drift on left. Left sided weakness, 4/5. Slight dysarthria.   Psychiatric:        Mood and Affect: Mood normal.      ED Results / Procedures / Treatments   Labs (all labs ordered are listed, but only abnormal results are displayed) Results for orders placed or  performed during the hospital encounter of 08/19/20  Protime-INR  Result Value Ref Range   Prothrombin Time 13.6 11.4 - 15.2 seconds   INR 1.1 0.8 - 1.2  APTT  Result Value Ref Range   aPTT 34 24 - 36 seconds  CBC  Result Value Ref Range   WBC 8.5 4.0 - 10.5 K/uL   RBC 5.35 4.22 - 5.81 MIL/uL   Hemoglobin 15.4 13.0 - 17.0 g/dL   HCT 47.5 39 - 52 %   MCV 88.8 80.0 - 100.0 fL   MCH 28.8 26.0 - 34.0 pg   MCHC 32.4 30.0 - 36.0 g/dL   RDW 14.0 11.5 - 15.5 %   Platelets 140 (L) 150 - 400 K/uL   nRBC 0.0 0.0 - 0.2 %  Differential  Result Value Ref Range   Neutrophils Relative % 81 %   Neutro Abs 6.9 1.7 - 7.7 K/uL   Lymphocytes Relative 9 %   Lymphs Abs 0.8 0.7 - 4.0 K/uL   Monocytes Relative 7 %   Monocytes Absolute 0.6 0 - 1 K/uL   Eosinophils Relative 1 %   Eosinophils Absolute 0.1 0 - 0 K/uL   Basophils Relative 1 %   Basophils Absolute 0.0 0 - 0 K/uL   Immature Granulocytes 1 %   Abs Immature Granulocytes 0.04 0.00 - 0.07 K/uL  Comprehensive metabolic panel  Result Value Ref Range   Sodium 136 135 - 145 mmol/L   Potassium 3.8 3.5 - 5.1 mmol/L   Chloride 103 98 - 111 mmol/L   CO2 24 22 - 32 mmol/L   Glucose, Bld 471 (H) 70 - 99 mg/dL   BUN 38 (H) 8 - 23 mg/dL   Creatinine, Ser 2.54 (H) 0.61 - 1.24 mg/dL   Calcium 9.5 8.9 - 10.3 mg/dL   Total Protein 5.8 (L) 6.5 - 8.1 g/dL   Albumin 3.3 (L) 3.5 - 5.0 g/dL   AST 18 15 - 41 U/L  ALT 19 0 - 44 U/L   Alkaline Phosphatase 76 38 - 126 U/L   Total Bilirubin 0.8 0.3 - 1.2 mg/dL   GFR calc non Af Amer 22 (L) >60 mL/min   GFR calc Af Amer 26 (L) >60 mL/min   Anion gap 9 5 - 15   CT Head Wo Contrast  Result Date: 08/19/2020 CLINICAL DATA:  Loss of coordination. Additional provided: Left-sided coordination problem yesterday, last known normal yesterday. EXAM: CT HEAD WITHOUT CONTRAST TECHNIQUE: Contiguous axial images were obtained from the base of the skull through the vertex without intravenous contrast. COMPARISON:  Head  CT 03/18/2020. FINDINGS: Brain: Stable, mild generalized parenchymal atrophy. There is a small focus of ill-defined hypoattenuation within the right thalamocapsular junction which is new as compared to the prior head CT of 03/18/2020 and likely reflecting an acute/early subacute lacunar infarct given the provided history (series 3, image 17). Redemonstrated chronic lacunar infarcts within the right basal ganglia and right internal capsule. Stable background mild ill-defined hypoattenuation within the cerebral white matter which is nonspecific, but consistent with chronic small vessel ischemic disease. There is no acute intracranial hemorrhage. No demarcated cortical infarct is identified. No extra-axial fluid collection. No evidence of intracranial mass. No midline shift. Vascular: No hyperdense vessel.  Atherosclerotic calcifications. Skull: Normal. Negative for fracture or focal lesion. Sinuses/Orbits: Visualized orbits show no acute finding. Small amount of frothy secretions within a posterior left ethmoid air cell. Minimal ethmoid and left maxillary sinus mucosal thickening at the imaged levels. Small left mastoid effusion. IMPRESSION: Small focus of ill-defined hypoattenuation within the right thalamocapsular junction which is new as compared to the prior head CT of 03/18/2020 and likely reflects an acute/early subacute lacunar infarct given the provided history. Stable background mild generalized parenchymal atrophy and chronic small vessel ischemic disease. Redemonstrated chronic lacunar infarcts within the right basal ganglia and right internal capsule. Right ethmoid sinusitis. Background mild scattered paranasal sinus mucosal thickening. Small left mastoid effusion. Electronically Signed   By: Kellie Simmering DO   On: 08/19/2020 14:55    EKG EKG Interpretation  Date/Time:  Saturday August 19 2020 13:55:50 EDT Ventricular Rate:  77 PR Interval:  174 QRS Duration: 92 QT Interval:  364 QTC  Calculation: 411 R Axis:   -9 Text Interpretation: Normal sinus rhythm Low voltage QRS Confirmed by Lajean Saver [id number]) on 08/20/2020 7:46:45 AM   Radiology CT Head Wo Contrast  Result Date: 08/19/2020 CLINICAL DATA:  Loss of coordination. Additional provided: Left-sided coordination problem yesterday, last known normal yesterday. EXAM: CT HEAD WITHOUT CONTRAST TECHNIQUE: Contiguous axial images were obtained from the base of the skull through the vertex without intravenous contrast. COMPARISON:  Head CT 03/18/2020. FINDINGS: Brain: Stable, mild generalized parenchymal atrophy. There is a small focus of ill-defined hypoattenuation within the right thalamocapsular junction which is new as compared to the prior head CT of 03/18/2020 and likely reflecting an acute/early subacute lacunar infarct given the provided history (series 3, image 17). Redemonstrated chronic lacunar infarcts within the right basal ganglia and right internal capsule. Stable background mild ill-defined hypoattenuation within the cerebral white matter which is nonspecific, but consistent with chronic small vessel ischemic disease. There is no acute intracranial hemorrhage. No demarcated cortical infarct is identified. No extra-axial fluid collection. No evidence of intracranial mass. No midline shift. Vascular: No hyperdense vessel.  Atherosclerotic calcifications. Skull: Normal. Negative for fracture or focal lesion. Sinuses/Orbits: Visualized orbits show no acute finding. Small amount of frothy secretions within a posterior left  ethmoid air cell. Minimal ethmoid and left maxillary sinus mucosal thickening at the imaged levels. Small left mastoid effusion. IMPRESSION: Small focus of ill-defined hypoattenuation within the right thalamocapsular junction which is new as compared to the prior head CT of 03/18/2020 and likely reflects an acute/early subacute lacunar infarct given the provided history. Stable background mild generalized  parenchymal atrophy and chronic small vessel ischemic disease. Redemonstrated chronic lacunar infarcts within the right basal ganglia and right internal capsule. Right ethmoid sinusitis. Background mild scattered paranasal sinus mucosal thickening. Small left mastoid effusion. Electronically Signed   By: Kellie Simmering DO   On: 08/19/2020 14:55    Procedures Procedures (including critical care time)  Medications Ordered in ED Medications  sodium chloride flush (NS) 0.9 % injection 3 mL (has no administration in time range)    ED Course  I have reviewed the triage vital signs and the nursing notes.  Pertinent labs & imaging results that were available during my care of the patient were reviewed by me and considered in my medical decision making (see chart for details).    MDM Rules/Calculators/A&P                          Iv ns. Continuous pulse ox and monitor. Ecg. Stat labs and imaging.   Reviewed nursing notes and prior charts for additional history.   MDM Number of Diagnoses or Management Options   Amount and/or Complexity of Data Reviewed Clinical lab tests: ordered and reviewed Tests in the radiology section of CPT: ordered and reviewed Tests in the medicine section of CPT: ordered and reviewed Discussion of test results with the performing providers: yes Decide to obtain previous medical records or to obtain history from someone other than the patient: yes Obtain history from someone other than the patient: yes Review and summarize past medical records: yes Discuss the patient with other providers: yes Independent visualization of images, tracings, or specimens: yes  Risk of Complications, Morbidity, and/or Mortality Presenting problems: high Diagnostic procedures: high Management options: high   Labs reviewed/interpreted by me - wbc normal. Glucose very high, 471. hco3 normal. novolog sq.   CT reviewed/interrpreted by me - right lacunar infarct.   Stroke order  set, neurochecks.   Swallow screen.  Ancef for cellulitis.   Hospitalists consulted for admission.   Neurology consulted.      Final Clinical Impression(s) / ED Diagnoses Final diagnoses:  None    Rx / DC Orders ED Discharge Orders    None       Lajean Saver, MD 08/20/20 [id number]

## 2020-08-20 NOTE — Progress Notes (Signed)
*  PRELIMINARY RESULTS* Echocardiogram 2D Echocardiogram has been performed.  Leavy Cella 08/20/2020, 2:38 PM

## 2020-08-20 NOTE — ED Notes (Signed)
Pt was taken to restroom to have under garments changed as the pt had a bowel movement. The pt was cleaned up to the best of our ability as we had limited resources. Due to pt's mobility, two person assist, it was not safe to have him stand in the bathroom, even with assistance, for the amount of time it would've taken to properly clean the pt.  Pt's family was advised that once pt gets to a room they will need to be fully cleaned.

## 2020-08-21 ENCOUNTER — Other Ambulatory Visit: Payer: Self-pay

## 2020-08-21 ENCOUNTER — Encounter (HOSPITAL_COMMUNITY): Payer: Self-pay | Admitting: Internal Medicine

## 2020-08-21 LAB — CBC WITH DIFFERENTIAL/PLATELET
Abs Immature Granulocytes: 0.04 10*3/uL (ref 0.00–0.07)
Basophils Absolute: 0.1 10*3/uL (ref 0.0–0.1)
Basophils Relative: 1 %
Eosinophils Absolute: 0.3 10*3/uL (ref 0.0–0.5)
Eosinophils Relative: 5 %
HCT: 45.1 % (ref 39.0–52.0)
Hemoglobin: 14.4 g/dL (ref 13.0–17.0)
Immature Granulocytes: 1 %
Lymphocytes Relative: 14 %
Lymphs Abs: 0.9 10*3/uL (ref 0.7–4.0)
MCH: 28.9 pg (ref 26.0–34.0)
MCHC: 31.9 g/dL (ref 30.0–36.0)
MCV: 90.6 fL (ref 80.0–100.0)
Monocytes Absolute: 0.6 10*3/uL (ref 0.1–1.0)
Monocytes Relative: 9 %
Neutro Abs: 4.5 10*3/uL (ref 1.7–7.7)
Neutrophils Relative %: 70 %
Platelets: 124 10*3/uL — ABNORMAL LOW (ref 150–400)
RBC: 4.98 MIL/uL (ref 4.22–5.81)
RDW: 14.3 % (ref 11.5–15.5)
WBC: 6.5 10*3/uL (ref 4.0–10.5)
nRBC: 0 % (ref 0.0–0.2)

## 2020-08-21 LAB — LIPID PANEL
Cholesterol: 172 mg/dL (ref 0–200)
HDL: 49 mg/dL (ref >40)
LDL Cholesterol: 96 mg/dL (ref 0–99)
Total CHOL/HDL Ratio: 3.5 RATIO
Triglycerides: 136 mg/dL (ref <150)
VLDL: 27 mg/dL (ref 0–40)

## 2020-08-21 LAB — BASIC METABOLIC PANEL
Anion gap: 10 (ref 5–15)
BUN: 35 mg/dL — ABNORMAL HIGH (ref 8–23)
CO2: 21 mmol/L — ABNORMAL LOW (ref 22–32)
Calcium: 9.3 mg/dL (ref 8.9–10.3)
Chloride: 110 mmol/L (ref 98–111)
Creatinine, Ser: 2.34 mg/dL — ABNORMAL HIGH (ref 0.61–1.24)
GFR calc Af Amer: 29 mL/min — ABNORMAL LOW (ref >60)
GFR calc non Af Amer: 25 mL/min — ABNORMAL LOW (ref >60)
Glucose, Bld: 191 mg/dL — ABNORMAL HIGH (ref 70–99)
Potassium: 3.7 mmol/L (ref 3.5–5.1)
Sodium: 141 mmol/L (ref 135–145)

## 2020-08-21 LAB — CBG MONITORING, ED
Glucose-Capillary: 189 mg/dL — ABNORMAL HIGH (ref 70–99)
Glucose-Capillary: 319 mg/dL — ABNORMAL HIGH (ref 70–99)

## 2020-08-21 LAB — GLUCOSE, CAPILLARY
Glucose-Capillary: 109 mg/dL — ABNORMAL HIGH (ref 70–99)
Glucose-Capillary: 168 mg/dL — ABNORMAL HIGH (ref 70–99)

## 2020-08-21 LAB — HEMOGLOBIN A1C
Hgb A1c MFr Bld: 10.6 % — ABNORMAL HIGH (ref 4.8–5.6)
Mean Plasma Glucose: 257.52 mg/dL

## 2020-08-21 MED ORDER — CLOPIDOGREL BISULFATE 75 MG PO TABS
75.0000 mg | ORAL_TABLET | Freq: Every day | ORAL | Status: DC
  Administered 2020-08-21 – 2020-08-30 (×10): 75 mg via ORAL
  Filled 2020-08-21 (×10): qty 1

## 2020-08-21 MED ORDER — ASPIRIN EC 81 MG PO TBEC
81.0000 mg | DELAYED_RELEASE_TABLET | Freq: Every day | ORAL | Status: DC
  Administered 2020-08-22 – 2020-08-30 (×9): 81 mg via ORAL
  Filled 2020-08-21 (×10): qty 1

## 2020-08-21 MED ORDER — CEPHALEXIN 500 MG PO CAPS
500.0000 mg | ORAL_CAPSULE | Freq: Four times a day (QID) | ORAL | Status: AC
  Administered 2020-08-21 – 2020-08-26 (×19): 500 mg via ORAL
  Filled 2020-08-21 (×19): qty 1

## 2020-08-21 NOTE — Patient Outreach (Signed)
  [facility]) Care Management Chronic Special Needs Program    08/21/2020  Name: [name], DOB: 09-Jan-1934  MRN: [id number]   [name] is enrolled in a chronic special needs plan. Notification received that client presented to the hospital on 08/19/20 with acute CVA. Client's care plan sent to [facility] Utilization management team.  Plan: RNCM will continue to follow, care coordinate with [facility] hospital liaison as needed. Updated assigned care coordinator, [name], RNCM upon her return.  Covering RNCM: Roy Silversmith, RN, MSN, [name] [phone]

## 2020-08-21 NOTE — Evaluation (Addendum)
Physical Therapy Evaluation Patient Details Name: [name] MRN: [id number] DOB: December 09, 1934 Today's Date: 08/21/2020   History of Present Illness  Pt is an 86 y/o male admitted secondary to multiple falls and L sided weakness. Found to have R thalamic infarct. PMH inlucdes CAD s/p CABG, HTN, DM, CKD, and dementia.   Clinical Impression  Pt admitted secondary to problem above with deficits below. Pt requiring max A to perform bed mobility tasks. Noted increased weakness in LLE. Further mobility was unsafe to attempt with +1 assist as pt still on stretcher in ED. Per son, pt is not safe with mobility tasks at home and has fallen multiple times. Recommending SNF level therapies at d/c to increase independence and safety with mobility.     Follow Up Recommendations SNF;Supervision/Assistance - 24 hour    Equipment Recommendations  None recommended by PT    Recommendations for Other Services       Precautions / Restrictions Precautions Precautions: Fall Precaution Comments: Reports at least 5-6 falls within the past few days.  Restrictions Weight Bearing Restrictions: No      Mobility  Bed Mobility Overal bed mobility: Needs Assistance Bed Mobility: Supine to Sit;Sit to Supine     Supine to sit: Max assist Sit to supine: Max assist   General bed mobility comments: Max A for LE and trunk assist to perform bed mobility tasks. Unsafe to attempt further mobility with +1 assist from higher stretcher height given current deficits.  Transfers                    Ambulation/Gait                Stairs            Wheelchair Mobility    Modified Rankin (Stroke Patients Only)       Balance Overall balance assessment: Needs assistance Sitting-balance support: No upper extremity supported;Feet supported Sitting balance-Leahy Scale: Fair                                       Pertinent Vitals/Pain Pain Assessment: No/denies pain    Home  Living Family/patient expects to be discharged to:: Skilled nursing facility                 Additional Comments: Per pt's son, pt is not doing well at home.     Prior Function Level of Independence: Independent with assistive device(s)         Comments: Was using rolling walker     Hand Dominance        Extremity/Trunk Assessment   Upper Extremity Assessment Upper Extremity Assessment: Defer to OT evaluation;LUE deficits/detail LUE Deficits / Details: Bruising noted in the back of L shoulder    Lower Extremity Assessment Lower Extremity Assessment: LLE deficits/detail;Generalized weakness;RLE deficits/detail RLE Deficits / Details: Pt with large bruise on knee.  LLE Deficits / Details: Pt with 3-/5 strength grossly throughout LLE. Pt reports decreased sensation comes and goes.     Cervical / Trunk Assessment Cervical / Trunk Assessment: Kyphotic  Communication   Communication: No difficulties  Cognition Arousal/Alertness: Awake/alert Behavior During Therapy: WFL for tasks assessed/performed Overall Cognitive Status: History of cognitive impairments - at baseline  General Comments: Dementia at baseline       General Comments General comments (skin integrity, edema, etc.): Pt's son present during session. Reports pt has been unable to care for himself at home    Exercises Total Joint Exercises Bridges: AROM;Both;5 reps;Supine General Exercises - Lower Extremity Ankle Circles/Pumps: AROM;Both;10 reps Heel Slides: AROM;Both;5 reps   Assessment/Plan    PT Assessment Patient needs continued PT services  PT Problem List Decreased strength;Decreased range of motion;Decreased balance;Decreased activity tolerance;Decreased mobility;Decreased knowledge of use of DME;Decreased knowledge of precautions;Pain       PT Treatment Interventions DME instruction;Gait training;Functional mobility training;Therapeutic  activities;Therapeutic exercise;Balance training;Cognitive remediation;Patient/family education;Neuromuscular re-education    PT Goals (Current goals can be found in the Care Plan section)  Acute Rehab PT Goals Patient Stated Goal: for pt to go to SNF per son PT Goal Formulation: With patient/family Time For Goal Achievement: 09/04/20 Potential to Achieve Goals: Good    Frequency Min 3X/week   Barriers to discharge        Co-evaluation               AM-PAC PT "6 Clicks" Mobility  Outcome Measure Help needed turning from your back to your side while in a flat bed without using bedrails?: Total Help needed moving from lying on your back to sitting on the side of a flat bed without using bedrails?: Total Help needed moving to and from a bed to a chair (including a wheelchair)?: Total Help needed standing up from a chair using your arms (e.g., wheelchair or bedside chair)?: Total Help needed to walk in hospital room?: Total Help needed climbing 3-5 steps with a railing? : Total 6 Click Score: 6    End of Session Equipment Utilized During Treatment: Gait belt Activity Tolerance: Patient tolerated treatment well Patient left: in bed;with call bell/phone within reach;with family/visitor present (on stretcher in ED ) Nurse Communication: Mobility status PT Visit Diagnosis: Unsteadiness on feet (R26.81);Muscle weakness (generalized) (M62.81);Difficulty in walking, not elsewhere classified (R26.2);History of falling (Z91.81);Repeated falls (R29.6)    Time: 6286-3817 PT Time Calculation (min) (ACUTE ONLY): 21 min   Charges:   PT Evaluation $PT Eval Moderate Complexity: 1 Mod          Reuel Derby, PT, DPT  Acute Rehabilitation Services  Pager: [phone] Office: [phone]   Rudean Hitt 08/21/2020, 4:53 PM

## 2020-08-21 NOTE — Progress Notes (Signed)
STROKE TEAM PROGRESS NOTE   INTERVAL HISTORY I personally reviewed history of presenting illness with the patient, electronic medical records and imaging films in PACS.  He presented with 2-day history of left-sided weakness as well as facial droop.  MRI scan shows a lacunar right thalamic infarct.  MRA interestingly shows a very large trigeminal artery communicating between the right carotid and upper basilar artery.  Dominant vertebral arteries appear hypoplastic.  2D echo shows normal ejection fraction without cardiac source of embolism.  LDL cholesterol is 38 mg percent.  Hemoglobin A1c is 10.6.  Carotid ultrasound shows no significant extracranial stenosis.  He states his left-sided weakness is still present.  Vitals:   08/21/20 0445 08/21/20 0700 08/21/20 0745 08/21/20 0830  BP: (!) 186/85 (!) 180/76 (!) 174/75   Pulse: 60 (!) 56 (!) 56 72  Resp:      Temp:      TempSrc:      SpO2: 94% 95% 95% 97%   CBC:  Recent Labs  Lab 08/19/20 1419 08/19/20 1419 08/20/20 0910 08/21/20 0414  WBC 8.5   < > 7.6 6.5  NEUTROABS 6.9  --   --  4.5  HGB 15.4   < > 15.0 14.4  HCT 47.5   < > 46.8 45.1  MCV 88.8   < > 91.4 90.6  PLT 140*   < > 130* 124*   < > = values in this interval not displayed.   Basic Metabolic Panel:  Recent Labs  Lab 08/19/20 1419 08/19/20 1419 08/20/20 0910 08/21/20 0414  NA 136  --   --  141  K 3.8  --   --  3.7  CL 103  --   --  110  CO2 24  --   --  21*  GLUCOSE 471*  --   --  191*  BUN 38*  --   --  35*  CREATININE 2.54*   < > 2.45* 2.34*  CALCIUM 9.5  --   --  9.3   < > = values in this interval not displayed.   Lipid Panel:  Recent Labs  Lab 08/21/20 0414  CHOL 172  TRIG 136  HDL 49  CHOLHDL 3.5  VLDL 27  LDLCALC 96   HgbA1c:  Recent Labs  Lab 08/21/20 0414  HGBA1C 10.6*   Urine Drug Screen: No results for input(s): LABOPIA, COCAINSCRNUR, LABBENZ, AMPHETMU, THCU, LABBARB in the last 168 hours.  Alcohol Level No results for input(s): ETH in  the last 168 hours.  IMAGING past 24 hours MR ANGIO HEAD WO CONTRAST  Result Date: 08/20/2020 CLINICAL DATA:  Stroke, follow-up.  Right thalamic infarct. EXAM: MRA HEAD WITHOUT CONTRAST TECHNIQUE: Angiographic images of the Circle of Willis were obtained using MRA technique without intravenous contrast. COMPARISON:  MR head without contrast 08/29/2020 and CT head without contrast 08/19/2020 FINDINGS: Internal carotid arteries are within normal limits from the high cervical segments through the ICA termini bilaterally. The A1 and M1 segments are normal. MCA bifurcations are intact. ACA and MCA branch vessels are within normal limits. 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 The more distal basilar artery is normal. Both posterior cerebral arteries originate from basilar tip. There is some attenuation of distal PCA branch vessels bilaterally. The more caudal basilar artery is diminutive. No flow is identified in the vertebral arteries on either side. IMPRESSION: 1. 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 2. No  flow identified in the vertebral arteries. 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 CTA of the head and neck could be used for further evaluation if clinically indicated. Electronically Signed   By: San Morelle M.D.   On: 08/20/2020 12:37   ECHOCARDIOGRAM COMPLETE  Result Date: 08/20/2020    ECHOCARDIOGRAM REPORT   Patient Name:   JERRICO COVELLO Date of Exam: 08/20/2020 Medical Rec #:  [id number]    Height:       70.0 in Accession #:    [id number]   Weight:       221.0 lb Date of Birth:  28-Sep-1934    BSA:          2.178 m Patient Age:    86 years     BP:           145/72 mmHg Patient Gender: M            HR:           62 bpm. Exam Location:  Inpatient Procedure: 2D Echo Indications:    Stroke 434.91 / I163.9  History:        Patient  has no prior history of Echocardiogram examinations.                 CAD; Risk Factors:Hypertension, Diabetes, Dyslipidemia and                 Former Smoker. Abdominal aortic aneurysm.  Sonographer:    Leavy Cella Referring Phys: [id number] [place]  1. Left ventricular ejection fraction, by estimation, is 65 to 70%. The left ventricle has normal function. The left ventricle has no regional wall motion abnormalities. There is moderate concentric left ventricular hypertrophy. Left ventricular diastolic parameters are consistent with Grade I diastolic dysfunction (impaired relaxation).  2. Right ventricular systolic function is normal. The right ventricular size is normal.  3. The mitral valve is normal in structure. Mild mitral valve regurgitation. No evidence of mitral stenosis.  4. The aortic valve is normal in structure. There is severe calcifcation of the aortic valve. There is severe thickening of the aortic valve. Aortic valve regurgitation is mild. Mild aortic valve stenosis.  5. The inferior vena cava is normal in size with greater than 50% respiratory variability, suggesting right atrial pressure of 3 mmHg. Conclusion(s)/Recommendation(s): No intracardiac source of embolism detected on this transthoracic study. A transesophageal echocardiogram is recommended to exclude cardiac source of embolism if clinically indicated. FINDINGS  Left Ventricle: Left ventricular ejection fraction, by estimation, is 65 to 70%. The left ventricle has normal function. The left ventricle has no regional wall motion abnormalities. The left ventricular internal cavity size was normal in size. There is  moderate concentric left ventricular hypertrophy. Left ventricular diastolic parameters are consistent with Grade I diastolic dysfunction (impaired relaxation). Normal left ventricular filling pressure. Right Ventricle: The right ventricular size is normal. No increase in right ventricular wall thickness. Right  ventricular systolic function is normal. Left Atrium: Left atrial size was normal in size. Right Atrium: Right atrial size was normal in size. Pericardium: There is no evidence of pericardial effusion. Mitral Valve: The mitral valve is normal in structure. Mild mitral valve regurgitation. No evidence of mitral valve stenosis. Tricuspid Valve: The tricuspid valve is normal in structure. Tricuspid valve regurgitation is mild . No evidence of tricuspid stenosis. Aortic Valve: The aortic valve is normal in structure. There is severe calcifcation of  the aortic valve. There is severe thickening of the aortic valve. Aortic valve regurgitation is mild. Aortic regurgitation PHT measures 647 msec. Mild aortic stenosis is present. Pulmonic Valve: The pulmonic valve was normal in structure. Pulmonic valve regurgitation is not visualized. No evidence of pulmonic stenosis. Aorta: The aortic root is normal in size and structure. Venous: The inferior vena cava is normal in size with greater than 50% respiratory variability, suggesting right atrial pressure of 3 mmHg. IAS/Shunts: No atrial level shunt detected by color flow Doppler.  LEFT VENTRICLE PLAX 2D LVIDd:         4.10 cm Diastology LVIDs:         2.90 cm LV e' medial:    4.79 cm/s LV PW:         1.30 cm LV E/e' medial:  8.9 LV IVS:        1.30 cm LV e' lateral:   9.68 cm/s                        LV E/e' lateral: 4.4  RIGHT VENTRICLE RV S prime:     12.40 cm/s TAPSE (M-mode): 1.4 cm LEFT ATRIUM             Index       RIGHT ATRIUM           Index LA diam:        3.60 cm 1.65 cm/m  RA Area:     12.40 cm LA Vol (A2C):   61.9 ml 28.42 ml/m RA Volume:   26.60 ml  12.21 ml/m LA Vol (A4C):   30.7 ml 14.10 ml/m LA Biplane Vol: 43.9 ml 20.16 ml/m  AORTIC VALVE AI PHT:      647 msec  AORTA Ao Root diam: 3.50 cm MITRAL VALVE MV Area (PHT): 2.39 cm MV Decel Time: 317 msec MV E velocity: 42.40 cm/s MV A velocity: 73.70 cm/s MV E/A ratio:  0.58 Ena Dawley MD Electronically  signed by Ena Dawley MD Signature Date/Time: 08/20/2020/3:37:39 PM    Final    VAS US CAROTID  Result Date: 08/20/2020 Carotid Arterial Duplex Study Indications:       CVA. Risk Factors:      Hypertension, hyperlipidemia, Diabetes. Comparison Study:  No prior studies. Performing Technologist: Darlin Coco  Examination Guidelines: A complete evaluation includes B-mode imaging, spectral Doppler, color Doppler, and power Doppler as needed of all accessible portions of each vessel. Bilateral testing is considered an integral part of a complete examination. Limited examinations for reoccurring indications may be performed as noted.  Right Carotid Findings: +----------+--------+--------+--------+------------------+------------------+           PSV cm/sEDV cm/sStenosisPlaque DescriptionComments           +----------+--------+--------+--------+------------------+------------------+ CCA Prox  60      12                                intimal thickening +----------+--------+--------+--------+------------------+------------------+ CCA Distal40      10                                intimal thickening +----------+--------+--------+--------+------------------+------------------+ ICA Prox  42      8       1-39%   heterogenous                         +----------+--------+--------+--------+------------------+------------------+  ICA Distal50      9                                                    +----------+--------+--------+--------+------------------+------------------+ ECA       67                                                           +----------+--------+--------+--------+------------------+------------------+ +----------+--------+-------+----------------+-------------------+           PSV cm/sEDV cmsDescribe        Arm Pressure (mmHG) +----------+--------+-------+----------------+-------------------+ [id number]             Multiphasic, WNL                     +----------+--------+-------+----------------+-------------------+ +---------+--------+--------+--------------+ VertebralPSV cm/sEDV cm/sNot identified +---------+--------+--------+--------------+  Left Carotid Findings: +----------+--------+--------+--------+------------------+------------------+           PSV cm/sEDV cm/sStenosisPlaque DescriptionComments           +----------+--------+--------+--------+------------------+------------------+ CCA Prox  57      10                                intimal thickening +----------+--------+--------+--------+------------------+------------------+ CCA Distal50      11                                intimal thickening +----------+--------+--------+--------+------------------+------------------+ ICA Prox  86      15      1-39%   heterogenous                         +----------+--------+--------+--------+------------------+------------------+ ICA Distal63      16                                                   +----------+--------+--------+--------+------------------+------------------+ ECA       67                                                           +----------+--------+--------+--------+------------------+------------------+ +----------+--------+--------+----------------+-------------------+           PSV cm/sEDV cm/sDescribe        Arm Pressure (mmHG) +----------+--------+--------+----------------+-------------------+ Subclavian101             Multiphasic, WNL                    +----------+--------+--------+----------------+-------------------+ +---------+--------+--+--------+--+---------+ VertebralPSV cm/s61EDV cm/s24Antegrade +---------+--------+--+--------+--+---------+   Summary: Right Carotid: Velocities in the right ICA are consistent with a 1-39% stenosis. Left Carotid: Velocities in the left ICA are consistent with a 1-39% stenosis. Vertebrals:  Left vertebral artery demonstrates antegrade  flow. Right vertebral              artery was  not visualized. Subclavians: Normal flow hemodynamics were seen in bilateral subclavian              arteries. *See table(s) above for measurements and observations.  Electronically signed by Monica Martinez MD on 08/20/2020 at 1:39:47 PM.    Final     PHYSICAL EXAM Pleasant elderly Caucasian male not in distress.  Is slightly hard of hearing. . Afebrile. Head is nontraumatic. Neck is supple without bruit.    Cardiac exam no murmur or gallop. Lungs are clear to auscultation. Distal pulses are well felt. Neurological Exam : Awake alert oriented x 3 normal speech and language. Mild left lower face asymmetry. Tongue midline.  Mild left upper and lower extremity drift. Mild diminished fine finger movements on left. Orbits right over left upper extremity. Mild left grip weak.. Normal sensation . Normal coordination. ASSESSMENT/PLAN Mr. HAARIS METALLO is a 86 y.o. male with history of DM, HTN, CAD, diabetic neuropathy presenting with 2d hx of L sided weakness and gait instability.   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 Small vessel disease. Atrophy. Old R basal ganglia and R internal capsule infarcts. Sinus dz.   MRI  Lateral R thalamic and intracranial infarct w/ petechial hemorrhage. Old R globus pallidus and caudate infarcts Small vessel disease. Atrophy. L mastoid effusion.  MRA  Primitive trigeminal artery  Carotid Doppler  B ICA 1-39% stenosis, VAs antegrade   2D Echo EF 65-70%. No source of embolus   LDL 96  HgbA1c 10.6  VTE prophylaxis - Lovenox 30 mg sq daily   aspirin 81 mg daily prior to admission, now on aspirin 325 mg daily. Decrease aspirin to 81 and add plavix x 3 weeks then plavix alone    Therapy recommendations:  SNF  Disposition:  pending  (lives w. Wife who is not in good shape per son who is at the bedside)  Hypertension  Stable . Permissive  hypertension (OK if < 220/120) but gradually normalize in 5-7 days . Long-term BP goal normotensive  Hyperlipidemia  Home meds:  lipitor 40, resumed in hospital  LDL 96, goal < 70  Continue statin at discharge  Diabetes type II Uncontrolled  HgbA1c 10.6, goal < 7.0  DB coordinator following  Other Stroke Risk Factors  Advanced age  Former Cigarette smoker, quit 35y ago  Obesity, There is no height or weight on file to calculate BMI., recommend weight loss, diet and exercise as appropriate   Family hx stroke (mother, father)  Coronary artery disease s/p CABG  Other Active Problems  Baseline cognitive deficits, on aricept  L elbow cellulitis  CKD stage IV  Hypothyroidism   Depression/anxiety  Hospital day # 1 He presented with left-sided weakness secondary to right thalamic subcortical infarct likely from small vessel disease.  Recommend continue ongoing stroke work-up and aggressive risk factor modification.  Aspirin and Plavix for 3 weeks followed by Plavix alone.  Physical occupational therapy and may likely need inpatient rehab.  Long discussion with patient and his grandson at the bedside and answered questions.  Discussed with Dr. Maylene Roes.  Greater than 50% time during this 35-minute visit was spent on counseling and coordination of care about his lacunar stroke and answering questions.. Follow-up as an outpatient in stroke clinic in 6 weeks..  Stroke team will sign off.  Kindly call for questions. Antony Contras, MD To contact Stroke Continuity provider, please refer to [email or web address] After hours,  contact General Neurology

## 2020-08-21 NOTE — Progress Notes (Signed)
PROGRESS NOTE    [name]  BSJ:[id number] DOB: 03/22/1934 DOA: 08/19/2020 PCP: Hulan Fess, MD     Brief Narrative:  [name] is an 86 y.o. male with medical history significant of coronary artery disease status post CABG, hypertension, hyperlipidemia, type 2 diabetes mellitus, CKD stage IIIb, hypothyroidism, depression/anxiety, memory difficulty, chronic bilateral leg swelling, AAA status post repair, who presents to emergency department with recurrent falls and left-sided weakness started 2 days ago.  Rash on left arm which he noticed yesterday. Patient reports left-sided weakness which started on Thursday evening.  He typically ambulates with cane at baseline and fell 3 times on Thursday and 2 times on Friday.  Reports difficulty with ambulation due to his left leg giving out.  CT head shows small focus of ill-defined hypoattenuation within the right thalamus capsular junction which is new as compared to the prior head CT and likely reflects an acute/early subacute lacunar infarct.  Neurology was consulted and patient admitted for stroke work-up.  New events last 24 hours / Subjective: Patient seen and examined in the emergency department, he has no new complaints, states that the rash over his left elbow has improved, unsure the inciting event.  Assessment & Plan:   Principal Problem:   Ischemic stroke [facility]) Active Problems:   Coronary artery disease   Essential hypertension   Hyperlipidemia   DM (diabetes mellitus) (HCC)   Chronic kidney disease, stage IV (severe) (HCC)   Thrombocytopenia (HCC)   Hypothyroidism   Dementia (HCC)   Depression   Cellulitis   Acute right thalamus and internal capsule CVA -CT head: Small focus of ill-defined hypoattenuation within the right thalamocapsular junction which is new as compared to the prior head CT of 03/18/2020 and likely reflects an acute/early subacute lacunar infarct given the provided history. -MRI brain: Acute/subacute 13  mm infarct involving the lateral right thalamus and internal capsule with small focus of central petechial hemorrhage. -Echo: No intracardiac source of embolism detected on this transthoracic study -Carotid duplex US: 1-39% stenosis bilaterally   -Appreciate neurology -PT OT SLP -Continue aspirin, Lipitor.  Added on Plavix  Left elbow cellulitis -Continue Rocephin -Improving  Essential hypertension -Hold metoprolol to allow permissive hypertension and acute stroke  Diabetes mellitus type 2, uncontrolled with hyperglycemia -Hemoglobin A1c 10.6 -Diabetic coordinator consult -Continue Lantus, sliding scale insulin  Hyperlipidemia -Continue Lipitor  CKD stage IV -Baseline creatinine 2.6 -Stable   Hypothyroidism -Continue Synthroid  Coronary artery disease status post CABG -Continue aspirin, Lipitor  Depression, anxiety -Continue Zoloft, Cymbalta  Dementia -Continue Aricept   DVT prophylaxis:  enoxaparin (LOVENOX) injection 30 mg Start: 08/20/20 1000 SCD's Start: 08/20/20 0810  Code Status: Full code Family Communication: No family at bedside; called wife and left a voicemail  Disposition Plan:  Status is: Inpatient  Remains inpatient appropriate because:Ongoing diagnostic testing needed not appropriate for outpatient work up   Dispo: The patient is from: Home              Anticipated d/c is to: Home              Anticipated d/c date is: 1 day              Patient currently is not medically stable to d/c.  Continue stroke work-up, IV antibiotics for left elbow cellulitis.   Consultants:   Neurology  Procedures:   None  Antimicrobials:  Anti-infectives (From admission, onward)   Start     Dose/Rate Route Frequency Ordered Stop  08/20/20 1115  cefTRIAXone (ROCEPHIN) 1 g in sodium chloride 0.9 % 100 mL IVPB        1 g 200 mL/hr over 30 Minutes Intravenous Every 24 hours 08/20/20 1102     08/20/20 1000  cefTRIAXone (ROCEPHIN) 1 g in sodium chloride 0.9 %  100 mL IVPB  Status:  Discontinued        1 g 200 mL/hr over 30 Minutes Intravenous Every 24 hours 08/20/20 0950 08/20/20 0951   08/20/20 0800  ceFAZolin (ANCEF) IVPB 1 g/50 mL premix        1 g 100 mL/hr over 30 Minutes Intravenous  Once 08/20/20 0757 08/20/20 1224        Objective: Vitals:   08/21/20 0445 08/21/20 0700 08/21/20 0745 08/21/20 0830  BP: (!) 186/85 (!) 180/76 (!) 174/75   Pulse: 60 (!) 56 (!) 56 72  Resp:      Temp:      TempSrc:      SpO2: 94% 95% 95% 97%    Intake/Output Summary (Last 24 hours) at 08/21/2020 1240 Last data filed at 08/20/2020 2222 Gross per 24 hour  Intake 1100 ml  Output 225 ml  Net 875 ml   There were no vitals filed for this visit.  Examination:  General exam: Appears calm and comfortable  Respiratory system: Clear to auscultation. Respiratory effort normal. No respiratory distress. No conversational dyspnea.  Cardiovascular system: S1 & S2 heard, RRR. No pedal edema. Gastrointestinal system: Abdomen is nondistended, soft and nontender. Normal bowel sounds heard. Central nervous system: Alert and oriented. LUE weaker compared to RUE. No pronator drift. CN 2-12 grossly intact.  Extremities: Symmetric in appearance  Skin: No rashes, lesions or ulcers on exposed skin  Psychiatry: Stable   Data Reviewed: I have personally reviewed following labs and imaging studies  CBC: Recent Labs  Lab 08/19/20 1419 08/20/20 0910 08/21/20 0414  WBC 8.5 7.6 6.5  NEUTROABS 6.9  --  4.5  HGB 15.4 15.0 14.4  HCT 47.5 46.8 45.1  MCV 88.8 91.4 90.6  PLT 140* 130* 341*   Basic Metabolic Panel: Recent Labs  Lab 08/19/20 1419 08/20/20 0910 08/21/20 0414  NA 136  --  141  K 3.8  --  3.7  CL 103  --  110  CO2 24  --  21*  GLUCOSE 471*  --  191*  BUN 38*  --  35*  CREATININE 2.54* 2.45* 2.34*  CALCIUM 9.5  --  9.3   GFR: CrCl cannot be calculated (Unknown ideal weight.). Liver Function Tests: Recent Labs  Lab 08/19/20 1419  AST 18    ALT 19  ALKPHOS 76  BILITOT 0.8  PROT 5.8*  ALBUMIN 3.3*   No results for input(s): LIPASE, AMYLASE in the last 168 hours. No results for input(s): AMMONIA in the last 168 hours. Coagulation Profile: Recent Labs  Lab 08/19/20 1419  INR 1.1   Cardiac Enzymes: No results for input(s): CKTOTAL, CKMB, CKMBINDEX, TROPONINI in the last 168 hours. BNP (last 3 results) No results for input(s): PROBNP in the last 8760 hours. HbA1C: Recent Labs    08/20/20 0910 08/21/20 0414  HGBA1C 10.7* 10.6*   CBG: Recent Labs  Lab 08/20/20 0823 08/20/20 1215 08/20/20 1731 08/20/20 2142 08/21/20 0824  GLUCAP 278* 228* 254* 206* 189*   Lipid Profile: Recent Labs    08/21/20 0414  CHOL 172  HDL 49  LDLCALC 96  TRIG 136  CHOLHDL 3.5   Thyroid Function Tests: Recent  Labs    08/20/20 0910  TSH 4.484   Anemia Panel: No results for input(s): VITAMINB12, FOLATE, FERRITIN, TIBC, IRON, RETICCTPCT in the last 72 hours. Sepsis Labs: No results for input(s): PROCALCITON, LATICACIDVEN in the last 168 hours.  Recent Results (from the past 240 hour(s))  SARS Coronavirus 2 by RT PCR (hospital order, performed in [facility] hospital lab) Nasopharyngeal Nasopharyngeal Swab     Status: None   Collection Time: 08/20/20 12:21 PM   Specimen: Nasopharyngeal Swab  Result Value Ref Range Status   SARS Coronavirus 2 NEGATIVE NEGATIVE Final    Comment: (NOTE) SARS-CoV-2 target nucleic acids are NOT DETECTED.  The SARS-CoV-2 RNA is generally detectable in upper and lower respiratory specimens during the acute phase of infection. The lowest concentration of SARS-CoV-2 viral copies this assay can detect is 250 copies / mL. A negative result does not preclude SARS-CoV-2 infection and should not be used as the sole basis for treatment or other patient management decisions.  A negative result may occur with improper specimen collection / handling, submission of specimen other than nasopharyngeal  swab, presence of viral mutation(s) within the areas targeted by this assay, and inadequate number of viral copies (<250 copies / mL). A negative result must be combined with clinical observations, patient history, and epidemiological information.  Fact Sheet for Patients:   StrictlyIdeas.no  Fact Sheet for Healthcare Providers: BankingDealers.co.za  This test is not yet approved or  cleared by the [place] FDA and has been authorized for detection and/or diagnosis of SARS-CoV-2 by FDA under an Emergency Use Authorization (EUA).  This EUA will remain in effect (meaning this test can be used) for the duration of the COVID-19 declaration under Section 564(b)(1) of the Act, 21 U.S.C. section 360bbb-3(b)(1), unless the authorization is terminated or revoked sooner.  Performed at [facility], [place] [address] [place],  [address]       Radiology Studies: CT Head Wo Contrast  Result Date: 08/19/2020 CLINICAL DATA:  Loss of coordination. Additional provided: Left-sided coordination problem yesterday, last known normal yesterday. EXAM: CT HEAD WITHOUT CONTRAST TECHNIQUE: Contiguous axial images were obtained from the base of the skull through the vertex without intravenous contrast. COMPARISON:  Head CT 03/18/2020. FINDINGS: Brain: Stable, mild generalized parenchymal atrophy. There is a small focus of ill-defined hypoattenuation within the right thalamocapsular junction which is new as compared to the prior head CT of 03/18/2020 and likely reflecting an acute/early subacute lacunar infarct given the provided history (series 3, image 17). Redemonstrated chronic lacunar infarcts within the right basal ganglia and right internal capsule. Stable background mild ill-defined hypoattenuation within the cerebral white matter which is nonspecific, but consistent with chronic small vessel ischemic disease. There is no acute intracranial  hemorrhage. No demarcated cortical infarct is identified. No extra-axial fluid collection. No evidence of intracranial mass. No midline shift. Vascular: No hyperdense vessel.  Atherosclerotic calcifications. Skull: Normal. Negative for fracture or focal lesion. Sinuses/Orbits: Visualized orbits show no acute finding. Small amount of frothy secretions within a posterior left ethmoid air cell. Minimal ethmoid and left maxillary sinus mucosal thickening at the imaged levels. Small left mastoid effusion. IMPRESSION: Small focus of ill-defined hypoattenuation within the right thalamocapsular junction which is new as compared to the prior head CT of 03/18/2020 and likely reflects an acute/early subacute lacunar infarct given the provided history. Stable background mild generalized parenchymal atrophy and chronic small vessel ischemic disease. Redemonstrated chronic lacunar infarcts within the right basal ganglia and right  internal capsule. Right ethmoid sinusitis. Background mild scattered paranasal sinus mucosal thickening. Small left mastoid effusion. Electronically Signed   By: Kellie Simmering DO   On: 08/19/2020 14:55   MR ANGIO HEAD WO CONTRAST  Result Date: 08/20/2020 CLINICAL DATA:  Stroke, follow-up.  Right thalamic infarct. EXAM: MRA HEAD WITHOUT CONTRAST TECHNIQUE: Angiographic images of the Circle of Willis were obtained using MRA technique without intravenous contrast. COMPARISON:  MR head without contrast 08/29/2020 and CT head without contrast 08/19/2020 FINDINGS: Internal carotid arteries are within normal limits from the high cervical segments through the ICA termini bilaterally. The A1 and M1 segments are normal. MCA bifurcations are intact. ACA and MCA branch vessels are within normal limits. 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 The more distal basilar artery is normal. Both posterior cerebral arteries originate from basilar tip. There is some  attenuation of distal PCA branch vessels bilaterally. The more caudal basilar artery is diminutive. No flow is identified in the vertebral arteries on either side. IMPRESSION: 1. 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 2. No flow identified in the vertebral arteries. 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 CTA of the head and neck could be used for further evaluation if clinically indicated. Electronically Signed   By: San Morelle M.D.   On: 08/20/2020 12:37   MR BRAIN WO CONTRAST  Result Date: 08/20/2020 CLINICAL DATA:  Stroke, follow-up. Acute onset of left-sided weakness and abnormal gait. Dysarthria. EXAM: MRI HEAD WITHOUT CONTRAST TECHNIQUE: Multiplanar, multiecho pulse sequences of the brain and surrounding structures were obtained without intravenous contrast. COMPARISON:  CT head without contrast 08/19/2020 FINDINGS: Brain: Acute/subacute infarct in the lateral right thalamus and internal capsule is confirmed, measuring 13 mm. No other acute infarct is present. T2 signal changes are associated with the acute/subacute infarct. Susceptibility centrally suggests some petechial hemorrhage within the infarct. Remote lacunar infarcts of the right globus pallidus and caudate are stable. Dilated perivascular spaces are present throughout the basal ganglia. Periventricular white matter changes are stable. Ventricles are proportionate to the degree of atrophy. No significant extraaxial fluid collection is present. The internal auditory canals are within normal limits. The brainstem and cerebellum are within normal limits. Remote blood products are present in the left occipital white matter, new from the prior exam. Vascular: Flow is present in the major intracranial arteries. Skull and upper cervical spine: The craniocervical junction is normal. Upper cervical spine is within normal limits.  Marrow signal is unremarkable. Sinuses/Orbits: Left mastoid effusion is present. No obstructing nasopharyngeal lesion is present. The paranasal sinuses and mastoid air cells are otherwise clear. The globes and orbits are within normal limits. IMPRESSION: 1. Acute/subacute 13 mm infarct involving the lateral right thalamus and internal capsule with small focus of central petechial hemorrhage. 2. Remote lacunar infarcts of the right globus pallidus and caudate. 3. Atrophy and white matter disease is stable. This likely reflects the sequela of chronic microvascular ischemia. 4. Left mastoid effusion. No obstructing nasopharyngeal lesion is present. These results were called by telephone at the time of interpretation on 08/20/2020 at 10:15 am to provider Dr. Ashok Cordia, who verbally acknowledged these results. Electronically Signed   By: San Morelle M.D.   On: 08/20/2020 10:21   ECHOCARDIOGRAM COMPLETE  Result Date: 08/20/2020    ECHOCARDIOGRAM REPORT   Patient Name:   ADITYA NASTASI Date of Exam: 08/20/2020 Medical  Rec #:  [id number]    Height:       70.0 in Accession #:    [id number]   Weight:       221.0 lb Date of Birth:  [birth year]    BSA:          2.178 m Patient Age:    19 years     BP:           145/72 mmHg Patient Gender: M            HR:           62 bpm. Exam Location:  Inpatient Procedure: 2D Echo Indications:    Stroke 434.91 / I163.9  History:        Patient has no prior history of Echocardiogram examinations.                 CAD; Risk Factors:Hypertension, Diabetes, Dyslipidemia and                 Former Smoker. Abdominal aortic aneurysm.  Sonographer:    Leavy Cella Referring Phys: [id number] [place]  1. Left ventricular ejection fraction, by estimation, is 65 to 70%. The left ventricle has normal function. The left ventricle has no regional wall motion abnormalities. There is moderate concentric left ventricular hypertrophy. Left ventricular diastolic parameters are consistent  with Grade I diastolic dysfunction (impaired relaxation).  2. Right ventricular systolic function is normal. The right ventricular size is normal.  3. The mitral valve is normal in structure. Mild mitral valve regurgitation. No evidence of mitral stenosis.  4. The aortic valve is normal in structure. There is severe calcifcation of the aortic valve. There is severe thickening of the aortic valve. Aortic valve regurgitation is mild. Mild aortic valve stenosis.  5. The inferior vena cava is normal in size with greater than 50% respiratory variability, suggesting right atrial pressure of 3 mmHg. Conclusion(s)/Recommendation(s): No intracardiac source of embolism detected on this transthoracic study. A transesophageal echocardiogram is recommended to exclude cardiac source of embolism if clinically indicated. FINDINGS  Left Ventricle: Left ventricular ejection fraction, by estimation, is 65 to 70%. The left ventricle has normal function. The left ventricle has no regional wall motion abnormalities. The left ventricular internal cavity size was normal in size. There is  moderate concentric left ventricular hypertrophy. Left ventricular diastolic parameters are consistent with Grade I diastolic dysfunction (impaired relaxation). Normal left ventricular filling pressure. Right Ventricle: The right ventricular size is normal. No increase in right ventricular wall thickness. Right ventricular systolic function is normal. Left Atrium: Left atrial size was normal in size. Right Atrium: Right atrial size was normal in size. Pericardium: There is no evidence of pericardial effusion. Mitral Valve: The mitral valve is normal in structure. Mild mitral valve regurgitation. No evidence of mitral valve stenosis. Tricuspid Valve: The tricuspid valve is normal in structure. Tricuspid valve regurgitation is mild . No evidence of tricuspid stenosis. Aortic Valve: The aortic valve is normal in structure. There is severe calcifcation of the  aortic valve. There is severe thickening of the aortic valve. Aortic valve regurgitation is mild. Aortic regurgitation PHT measures 647 msec. Mild aortic stenosis is present. Pulmonic Valve: The pulmonic valve was normal in structure. Pulmonic valve regurgitation is not visualized. No evidence of pulmonic stenosis. Aorta: The aortic root is normal in size and structure. Venous: The inferior vena cava is normal in size with greater than 50% respiratory variability, suggesting right atrial pressure of 3 mmHg.  IAS/Shunts: No atrial level shunt detected by color flow Doppler.  LEFT VENTRICLE PLAX 2D LVIDd:         4.10 cm Diastology LVIDs:         2.90 cm LV e' medial:    4.79 cm/s LV PW:         1.30 cm LV E/e' medial:  8.9 LV IVS:        1.30 cm LV e' lateral:   9.68 cm/s                        LV E/e' lateral: 4.4  RIGHT VENTRICLE RV S prime:     12.40 cm/s TAPSE (M-mode): 1.4 cm LEFT ATRIUM             Index       RIGHT ATRIUM           Index LA diam:        3.60 cm 1.65 cm/m  RA Area:     12.40 cm LA Vol (A2C):   61.9 ml 28.42 ml/m RA Volume:   26.60 ml  12.21 ml/m LA Vol (A4C):   30.7 ml 14.10 ml/m LA Biplane Vol: 43.9 ml 20.16 ml/m  AORTIC VALVE AI PHT:      647 msec  AORTA Ao Root diam: 3.50 cm MITRAL VALVE MV Area (PHT): 2.39 cm MV Decel Time: 317 msec MV E velocity: 42.40 cm/s MV A velocity: 73.70 cm/s MV E/A ratio:  0.58 Ena Dawley MD Electronically signed by Ena Dawley MD Signature Date/Time: 08/20/2020/3:37:39 PM    Final    VAS US CAROTID  Result Date: 08/20/2020 Carotid Arterial Duplex Study Indications:       CVA. Risk Factors:      Hypertension, hyperlipidemia, Diabetes. Comparison Study:  No prior studies. Performing Technologist: Darlin Coco  Examination Guidelines: A complete evaluation includes B-mode imaging, spectral Doppler, color Doppler, and power Doppler as needed of all accessible portions of each vessel. Bilateral testing is considered an integral part of a complete  examination. Limited examinations for reoccurring indications may be performed as noted.  Right Carotid Findings: +----------+--------+--------+--------+------------------+------------------+           PSV cm/sEDV cm/sStenosisPlaque DescriptionComments           +----------+--------+--------+--------+------------------+------------------+ CCA Prox  60      12                                intimal thickening +----------+--------+--------+--------+------------------+------------------+ CCA Distal40      10                                intimal thickening +----------+--------+--------+--------+------------------+------------------+ ICA Prox  42      8       1-39%   heterogenous                         +----------+--------+--------+--------+------------------+------------------+ ICA Distal50      9                                                    +----------+--------+--------+--------+------------------+------------------+ ECA       67                                                           +----------+--------+--------+--------+------------------+------------------+ +----------+--------+-------+----------------+-------------------+  PSV cm/sEDV cmsDescribe        Arm Pressure (mmHG) +----------+--------+-------+----------------+-------------------+ [id number]             Multiphasic, WNL                    +----------+--------+-------+----------------+-------------------+ +---------+--------+--------+--------------+ VertebralPSV cm/sEDV cm/sNot identified +---------+--------+--------+--------------+  Left Carotid Findings: +----------+--------+--------+--------+------------------+------------------+           PSV cm/sEDV cm/sStenosisPlaque DescriptionComments           +----------+--------+--------+--------+------------------+------------------+ CCA Prox  57      10                                intimal thickening  +----------+--------+--------+--------+------------------+------------------+ CCA Distal50      11                                intimal thickening +----------+--------+--------+--------+------------------+------------------+ ICA Prox  86      15      1-39%   heterogenous                         +----------+--------+--------+--------+------------------+------------------+ ICA Distal63      16                                                   +----------+--------+--------+--------+------------------+------------------+ ECA       67                                                           +----------+--------+--------+--------+------------------+------------------+ +----------+--------+--------+----------------+-------------------+           PSV cm/sEDV cm/sDescribe        Arm Pressure (mmHG) +----------+--------+--------+----------------+-------------------+ Subclavian101             Multiphasic, WNL                    +----------+--------+--------+----------------+-------------------+ +---------+--------+--+--------+--+---------+ VertebralPSV cm/s61EDV cm/s24Antegrade +---------+--------+--+--------+--+---------+   Summary: Right Carotid: Velocities in the right ICA are consistent with a 1-39% stenosis. Left Carotid: Velocities in the left ICA are consistent with a 1-39% stenosis. Vertebrals:  Left vertebral artery demonstrates antegrade flow. Right vertebral              artery was not visualized. Subclavians: Normal flow hemodynamics were seen in bilateral subclavian              arteries. *See table(s) above for measurements and observations.  Electronically signed by Monica Martinez MD on 08/20/2020 at 1:39:47 PM.    Final       Scheduled Meds: . [START ON 08/22/2020] aspirin EC  81 mg Oral Daily  . atorvastatin  40 mg Oral QPM  . clopidogrel  75 mg Oral Daily  . donepezil  10 mg Oral QHS  . DULoxetine  30 mg Oral QHS  . enoxaparin (LOVENOX) injection   30 mg Subcutaneous Daily  . insulin aspart  0-5 Units Subcutaneous QHS  . insulin aspart  0-9 Units Subcutaneous TID WC  . insulin glargine  25 Units Subcutaneous BID  .  levothyroxine  75 mcg Oral QAC breakfast  . sertraline  50 mg Oral Daily  . sodium chloride flush  3 mL Intravenous Once   Continuous Infusions: . cefTRIAXone (ROCEPHIN)  IV Stopped (08/20/20 1651)     LOS: 1 day      Time spent: 35 minutes   Dessa Phi, DO [facility] 08/21/2020, 12:40 PM   Available via Epic secure chat 7am-7pm After these hours, please refer to coverage provider listed on [email or web address]

## 2020-08-21 NOTE — Progress Notes (Signed)
Inpatient Diabetes Program Recommendations  AACE/ADA: New Consensus Statement on Inpatient Glycemic Control   Target Ranges:  Prepandial:   less than 140 mg/dL      Peak postprandial:   less than 180 mg/dL (1-2 hours)      Critically ill patients:  140 - 180 mg/dL  Results for DENNARD, VEZINA (MRN [id number]) as of 08/21/2020 14:48  Ref. Range 08/20/2020 08:23 08/20/2020 12:15 08/20/2020 17:31 08/20/2020 21:42 08/21/2020 08:24 08/21/2020 12:57  Glucose-Capillary Latest Ref Range: 70 - 99 mg/dL 278 (H) 228 (H) 254 (H) 206 (H) 189 (H) 319 (H)   Results for ERYK, BEAVERS (MRN [id number]) as of 08/21/2020 14:48  Ref. Range 08/21/2020 04:14  Hemoglobin A1C Latest Ref Range: 4.8 - 5.6 % 10.6 (H)   Review of Glycemic Control  Diabetes history: DM2 Outpatient Diabetes medications: Lantus 25-30 units BID, Novolog 0-15 units TID with meals Current orders for Inpatient glycemic control: Lantus 25 units BID, Novolog 0-9 units TID with meals, Novolog 0-5 units QHS  Inpatient Diabetes Program Recommendations:    Insulin: Please consider ordering Novolog 5 units TID with meals for meal coverage if patient eats at least 50% of meals.  NOTE: Patient current still in Emergency Department admitted with ischemic stroke and hx of demential. Called patient's wife Tagg Eustice) and spoke with her regarding patient's DM. Patient's wife is with her daughter Romie Minus Cozzins) and I was able to speak with both about of them. Patient's wife reports that patient manages his own DM himself. She notes that he checks glucose occasionally and a few days ago when he checked it, glucose was 300 mg/dl. She also states that she is not sure if patient is taking his insulin or not as she has not seen him take any insulin in a while. She notes that they only meal they eat at the same time is supper and patient is suppose to take insulin after meals and she gets up from the table before he finishes eating so she is not sure if he has taken  insulin or not. She also notes that patient has been eating things he should not be eating, notes he has bucket of candy beside chair and patient gets angry with her if she says anything about him eating candy. Patient's daughter Romie Minus) notes that she has been taking her father to doctor appointments and she has told him that he has only been taking 3 medications recently and has not been taking any insulin.  She reports that sometimes he tells the providers he sees that he is taking all medications as prescribed and sometimes he tells them the truth that he isn't taking them. When asked why he is not taking medications, he has stated that he does not really have a reason.  She states that her father needs more care than he is able to get at home and that she is in the process of trying to get him into an ALF. She inquired about talking with a case manager working with patient at the hospital to see if they could provide any assistance in expediting the process. Informed Romie Minus that a TOC consult would be ordered and noted that she has requested they call her [phone]) regarding ALF/rehab. Patient's wife and daughter have no questions at this time related to DM. Will continue to follow while inpatient.  Thanks, Barnie Alderman, RN, MSN, CDE Diabetes Coordinator Inpatient Diabetes Program [phone] (Team Pager from 8am to 5pm)

## 2020-08-21 NOTE — ED Notes (Signed)
Pt sleeping  Bps not being documented accurately

## 2020-08-21 NOTE — Progress Notes (Signed)
New Admission Note:  Arrival Method: pt arrived by stretcher from [facility] ED Mental Orientation: pt is alert and oriented to person, place time ans situation Telemetry: pt on telemetry box 16 Assessment: Completed Skin: pt has reddened areas on his left arm and rt ankle. Pt has some generalized edema. Some redness in his sacral area that is blanchable.  IV: 20G in Rt AC SL Pain: pt does not complain of any pain Safety Measures: Safety Fall Prevention Plan was given, discussed. Admission: Completed 3W: Patient has been orientated to the room, unit and the staff. Family: pt has son at bedside that is pleasant and supportive.  Orders have been reviewed and implemented. Will continue to monitor the patient. Call light has been placed within reach and bed alarm has been activated.   Janus Molder ,RN

## 2020-08-22 LAB — BASIC METABOLIC PANEL
Anion gap: 9 (ref 5–15)
BUN: 29 mg/dL — ABNORMAL HIGH (ref 8–23)
CO2: 21 mmol/L — ABNORMAL LOW (ref 22–32)
Calcium: 9 mg/dL (ref 8.9–10.3)
Chloride: 110 mmol/L (ref 98–111)
Creatinine, Ser: 2.05 mg/dL — ABNORMAL HIGH (ref 0.61–1.24)
GFR calc Af Amer: 33 mL/min — ABNORMAL LOW (ref >60)
GFR calc non Af Amer: 29 mL/min — ABNORMAL LOW (ref >60)
Glucose, Bld: 134 mg/dL — ABNORMAL HIGH (ref 70–99)
Potassium: 3.8 mmol/L (ref 3.5–5.1)
Sodium: 140 mmol/L (ref 135–145)

## 2020-08-22 LAB — GLUCOSE, CAPILLARY
Glucose-Capillary: 126 mg/dL — ABNORMAL HIGH (ref 70–99)
Glucose-Capillary: 111 mg/dL — ABNORMAL HIGH (ref 70–99)
Glucose-Capillary: 132 mg/dL — ABNORMAL HIGH (ref 70–99)
Glucose-Capillary: 123 mg/dL — ABNORMAL HIGH (ref 70–99)
Glucose-Capillary: 91 mg/dL (ref 70–99)

## 2020-08-22 MED ORDER — METOPROLOL SUCCINATE ER 25 MG PO TB24
25.0000 mg | ORAL_TABLET | Freq: Every day | ORAL | Status: DC
  Administered 2020-08-22 – 2020-08-30 (×8): 25 mg via ORAL
  Filled 2020-08-22 (×9): qty 1

## 2020-08-22 MED ORDER — INSULIN ASPART 100 UNIT/ML ~~LOC~~ SOLN
5.0000 [IU] | Freq: Three times a day (TID) | SUBCUTANEOUS | Status: DC
  Administered 2020-08-22 (×3): 5 [IU] via SUBCUTANEOUS

## 2020-08-22 NOTE — Evaluation (Signed)
Occupational Therapy Evaluation Patient Details Name: [name] MRN: [id number] DOB: [birth year] Today's Date: 08/22/2020    History of Present Illness Pt is an 86 y/o male admitted secondary to multiple falls and L sided weakness. Found to have R thalamic infarct. PMH inlucdes CAD s/p CABG, HTN, DM, CKD, and dementia.    Clinical Impression   PT admitted with R thalamic CVA. Pt currently with functional limitiations due to the deficits listed below (see OT problem list). Pt with much improved transfers compared to PT evaluation noted for OT evaluation. Pt able to progress to EOB on R and noted to have L UE deficits. Pt able to hold container for self feeding and support gait belt to thread bel t with R UE. Pt oriented to location and reason for admission. Pt eager to engage and be abel to d/c home with wife at ALF.  Pt will benefit from skilled OT to increase their independence and safety with adls and balance to allow discharge CIR.     Follow Up Recommendations  CIR    Equipment Recommendations  3 in 1 bedside commode;Other (comment) (RW)    Recommendations for Other Services Rehab consult     Precautions / Restrictions Precautions Precautions: Fall      Mobility Bed Mobility Overal bed mobility: Needs Assistance Bed Mobility: Supine to Sit;Sit to Supine;Rolling Rolling: Min assist (used bed rail)   Supine to sit: Min assist;HOB elevated Sit to supine: Mod assist   General bed mobility comments: pt requires (A) with side to supine due to LLE placement back on bed surface  Transfers Overall transfer level: Needs assistance Equipment used: Rolling walker (2 wheeled) Transfers: Sit to/from Stand Sit to Stand: Min assist;From elevated surface         General transfer comment: cues for hand placement with hand over hand L UE on RW and pushign with R UE. pt able to sustain L UE on RW throughout    Balance                                            ADL either performed or assessed with clinical judgement   ADL Overall ADL's : Needs assistance/impaired Eating/Feeding: Set up;Bed level   Grooming: Minimal assistance;Bed level   Upper Body Bathing: Minimal assistance;Sitting   Lower Body Bathing: Maximal assistance;Sit to/from stand       Lower Body Dressing: Maximal assistance;Sit to/from stand Lower Body Dressing Details (indicate cue type and reason): able to figure 4 cross L LE over R LE only. Pt unable to hold sock with BIL UE. L UE undershooting.  Toilet Transfer: Minimal assistance;Ambulation;RW           Functional mobility during ADLs: Minimal assistance;Rolling walker General ADL Comments: pt with very narrowed base of support with turnign with RW. pt literally has bil LE touching. Pt with widen base of support for side stepping and needs mod cues to correct     Vision Baseline Vision/History: Wears glasses Wears Glasses: At all times (glasses not present)       Perception     Praxis      Pertinent Vitals/Pain Pain Assessment: No/denies pain     Hand Dominance Right   Extremity/Trunk Assessment Upper Extremity Assessment Upper Extremity Assessment: LUE deficits/detail LUE Deficits / Details: able to sustain shoulder flexion 90 degrees for > 8 seconds. undershooting trying  to grab a sock LUE Sensation: decreased proprioception LUE Coordination: decreased fine motor   Lower Extremity Assessment Lower Extremity Assessment: Defer to PT evaluation;LLE deficits/detail RLE Deficits / Details: weakness LLE Sensation: decreased proprioception   Cervical / Trunk Assessment Cervical / Trunk Assessment: Kyphotic   Communication Communication Communication: No difficulties   Cognition Arousal/Alertness: Awake/alert Behavior During Therapy: WFL for tasks assessed/performed Overall Cognitive Status: History of cognitive impairments - at baseline                                 General  Comments: pt oriented to location reason for admission introduced daughter jean appropriate.    General Comments       Exercises     Shoulder Instructions      Home Living Family/patient expects to be discharged to:: Private residence                                 Additional Comments: moving into an ALF at the end of the month      Prior Functioning/Environment Level of Independence: Independent with assistive device(s)        Comments: with using RW        OT Problem List: Decreased strength;Decreased activity tolerance;Impaired balance (sitting and/or standing);Decreased safety awareness;Decreased knowledge of use of DME or AE;Decreased knowledge of precautions;Decreased cognition;Cardiopulmonary status limiting activity;Obesity;Impaired UE functional use      OT Treatment/Interventions: Self-care/ADL training;Therapeutic exercise;Neuromuscular education;DME and/or AE instruction;Energy conservation;Manual therapy;Modalities;Therapeutic activities;Cognitive remediation/compensation;Patient/family education;Balance training    OT Goals(Current goals can be found in the care plan section) Acute Rehab OT Goals Patient Stated Goal: per daughter want patient to be with wife OT Goal Formulation: With patient Time For Goal Achievement: 09/05/20 Potential to Achieve Goals: Good  OT Frequency: Min 3X/week   Barriers to D/C:    family reports that patient and wife are moving into ALF at the end of the month. Pt would have staff (A) and family       Co-evaluation              AM-PAC OT "6 Clicks" Daily Activity     Outcome Measure Help from another person eating meals?: A Little Help from another person taking care of personal grooming?: A Little Help from another person toileting, which includes using toliet, bedpan, or urinal?: A Lot Help from another person bathing (including washing, rinsing, drying)?: A Lot Help from another person to put on and  taking off regular upper body clothing?: A Little Help from another person to put on and taking off regular lower body clothing?: A Lot 6 Click Score: 15   End of Session Equipment Utilized During Treatment: Gait belt;Rolling walker Nurse Communication: Mobility status;Precautions  Activity Tolerance: Patient tolerated treatment well Patient left: in bed;with call bell/phone within reach;with bed alarm set;with family/visitor present  OT Visit Diagnosis: Unsteadiness on feet (R26.81);Muscle weakness (generalized) (M62.81)                Time: 2774-1287 OT Time Calculation (min): 20 min Charges:  OT General Charges $OT Visit: 1 Visit OT Evaluation $OT Eval Moderate Complexity: 1 Mod   Brynn, OTR/L  Acute Rehabilitation Services Pager: [phone] Office: [phone] .   Jeri Modena 08/22/2020, 4:24 PM

## 2020-08-22 NOTE — NC FL2 (Signed)
[place] LEVEL OF CARE SCREENING TOOL     IDENTIFICATION  Patient Name: [name] Birthdate: 1934-09-04 Sex: male Admission Date (Current Location): 08/19/2020  [facility] and [place] Number:  Herbalist and Address:  [facility], [place] [address] [place], [place] [address]      Provider Number: [id number]  Attending Physician Name and Address:  Dessa Phi, DO  Relative Name and Phone Number:  Guerin Lashomb, [phone]    Current Level of Care: Hospital Recommended Level of Care: [place] Prior Approval Number:    Date Approved/Denied:   PASRR Number:    Discharge Plan: SNF    Current Diagnoses: Patient Active Problem List   Diagnosis Date Noted  . CKD (chronic kidney disease), stage III 08/20/2020  . Ischemic stroke ([place]) 08/20/2020  . Thrombocytopenia ([place]) 08/20/2020  . Hypothyroidism 08/20/2020  . Dementia ([place]) 08/20/2020  . Depression 08/20/2020  . Cellulitis 08/20/2020  . Bilateral foot-drop 01/12/2020  . Diabetic neuropathy ([place]) 09/16/2017  . Gait abnormality 09/16/2017  . Memory difficulty 03/06/2017  . GIB (gastrointestinal bleeding) 03/31/2016  . Rectal bleeding   . Chronic kidney disease, stage IV (severe) ([place]) 03/23/2015  . Coronary artery disease 03/22/2014  . Essential hypertension 03/22/2014  . Hyperlipidemia 03/22/2014  . Multinodular goiter 03/22/2014  . DM (diabetes mellitus) ([place]) 03/22/2014  . Abdominal aortic aneurysm ([place]) 03/04/2012    Orientation RESPIRATION BLADDER Height & Weight     Self, Time, Situation, Place  Normal External catheter Weight: 193 lb 6.1 oz (87.7 kg) Height:  5\' 10"  (177.8 cm)  BEHAVIORAL SYMPTOMS/MOOD NEUROLOGICAL BOWEL NUTRITION STATUS      Continent Diet (see discharge summary)  AMBULATORY STATUS COMMUNICATION OF NEEDS Skin   Extensive Assist Verbally Other (Comment) (Cellulitis R, L ankle, arm)                       Personal  Care Assistance Level of Assistance  Bathing, Feeding, Dressing Bathing Assistance: Maximum assistance Feeding assistance: Independent Dressing Assistance: Maximum assistance     Functional Limitations Info  Sight, Hearing, Speech Sight Info: Adequate Hearing Info: Adequate Speech Info: Adequate    SPECIAL CARE FACTORS FREQUENCY  PT (By licensed PT), OT (By licensed OT)     PT Frequency: 5x week OT Frequency: 5x week            Contractures Contractures Info: Not present    Additional Factors Info  Code Status, Allergies, Psychotropic, Insulin Sliding Scale Code Status Info: Full Allergies Info: NKA Psychotropic Info: Sertraline, Donepezil, Duloxetine Insulin Sliding Scale Info: Insulin Aspart (Novolog) 0-5 U @ bedtime, 0-9 U 3x w/ meals, 5U 3x W/ meals. Insulin Glargine (Lantus) 25 U 2x daily.       Current Medications (08/22/2020):  This is the current hospital active medication list Current Facility-Administered Medications  Medication Dose Route Frequency Provider Last Rate Last Admin  . acetaminophen (TYLENOL) tablet 650 mg  650 mg Oral Q4H PRN Pahwani, Rinka R, MD       Or  . acetaminophen (TYLENOL) 160 MG/5ML solution 650 mg  650 mg Per Tube Q4H PRN Pahwani, Rinka R, MD       Or  . acetaminophen (TYLENOL) suppository 650 mg  650 mg Rectal Q4H PRN Pahwani, Rinka R, MD      . aspirin EC tablet 81 mg  81 mg Oral Daily Biby, Sharon L, NP   81 mg at  08/22/20 0955  . atorvastatin (LIPITOR) tablet 40 mg  40 mg Oral QPM Pahwani, Rinka R, MD   40 mg at 08/21/20 1849  . cephALEXin (KEFLEX) capsule 500 mg  500 mg Oral Q6H Dessa Phi, DO   500 mg at 08/22/20 1212  . clopidogrel (PLAVIX) tablet 75 mg  75 mg Oral Daily Burnetta Sabin L, NP   75 mg at 08/22/20 0955  . donepezil (ARICEPT) tablet 10 mg  10 mg Oral QHS Pahwani, Rinka R, MD   10 mg at 08/21/20 2207  . DULoxetine (CYMBALTA) DR capsule 30 mg  30 mg Oral QHS Pahwani, Rinka R, MD   30 mg at 08/21/20 2207  .  enoxaparin (LOVENOX) injection 30 mg  30 mg Subcutaneous Daily Pahwani, Rinka R, MD   30 mg at 08/22/20 0957  . insulin aspart (novoLOG) injection 0-5 Units  0-5 Units Subcutaneous QHS Pahwani, Rinka R, MD   2 Units at 08/20/20 2157  . insulin aspart (novoLOG) injection 0-9 Units  0-9 Units Subcutaneous TID WC Pahwani, Rinka R, MD   1 Units at 08/22/20 0651  . insulin aspart (novoLOG) injection 5 Units  5 Units Subcutaneous TID WC Dessa Phi, DO   5 Units at 08/22/20 0857  . insulin glargine (LANTUS) injection 25 Units  25 Units Subcutaneous BID Mckinley Jewel, MD   25 Units at 08/22/20 0955  . levothyroxine (SYNTHROID) tablet 75 mcg  75 mcg Oral QAC breakfast Pahwani, Rinka R, MD   75 mcg at 08/22/20 0646  . metoprolol succinate (TOPROL-XL) 24 hr tablet 25 mg  25 mg Oral Daily Dessa Phi, DO   25 mg at 08/22/20 1240  . sertraline (ZOLOFT) tablet 50 mg  50 mg Oral Daily Pahwani, Rinka R, MD   50 mg at 08/22/20 0955  . sodium chloride flush (NS) 0.9 % injection 3 mL  3 mL Intravenous Once Lajean Saver, MD         Discharge Medications: Please see discharge summary for a list of discharge medications.  Relevant Imaging Results:  Relevant Lab Results:   Additional Information SS# [id number]  Coralee Pesa

## 2020-08-22 NOTE — Progress Notes (Signed)
PROGRESS NOTE    [name]  SFK:[id number] DOB: November 25, 1934 DOA: 08/19/2020 PCP: Hulan Fess, MD     Brief Narrative:  [name] is an 86 y.o. male with medical history significant of coronary artery disease status post CABG, hypertension, hyperlipidemia, type 2 diabetes mellitus, CKD stage IIIb, hypothyroidism, depression/anxiety, memory difficulty, chronic bilateral leg swelling, AAA status post repair, who presents to emergency department with recurrent falls and left-sided weakness started 2 days ago.  Rash on left arm which he noticed yesterday. Patient reports left-sided weakness which started on Thursday evening.  He typically ambulates with cane at baseline and fell 3 times on Thursday and 2 times on Friday.  Reports difficulty with ambulation due to his left leg giving out.  CT head shows small focus of ill-defined hypoattenuation within the right thalamus capsular junction which is new as compared to the prior head CT and likely reflects an acute/early subacute lacunar infarct.  Neurology was consulted and patient admitted for stroke work-up.  New events last 24 hours / Subjective: Continues to have some weakness in his left upper extremity.  No other complaints.  Assessment & Plan:   Principal Problem:   Ischemic stroke [facility]) Active Problems:   Coronary artery disease   Essential hypertension   Hyperlipidemia   DM (diabetes mellitus) (HCC)   Chronic kidney disease, stage IV (severe) (HCC)   Thrombocytopenia (HCC)   Hypothyroidism   Dementia (HCC)   Depression   Cellulitis   Acute right thalamus and internal capsule CVA -CT head: Small focus of ill-defined hypoattenuation within the right thalamocapsular junction which is new as compared to the prior head CT of 03/18/2020 and likely reflects an acute/early subacute lacunar infarct given the provided history. -MRI brain: Acute/subacute 13 mm infarct involving the lateral right thalamus and internal capsule with small  focus of central petechial hemorrhage. -Echo: No intracardiac source of embolism detected on this transthoracic study -Carotid duplex US: 1-39% stenosis bilaterally   -Appreciate neurology -PT OT SLP -Continue aspirin and Plavix for 3 weeks, followed by Plavix alone -Lipitor -Follow-up with neurology in 6 weeks -PT OT recommending SNF placement  Left elbow cellulitis -Continue Keflex -Improving  Essential hypertension -Resume metoprolol  Diabetes mellitus type 2, uncontrolled with hyperglycemia -Hemoglobin A1c 10.6 -Diabetic coordinator consult -Continue Lantus, sliding scale insulin  Hyperlipidemia -Continue Lipitor  CKD stage IV -Baseline creatinine 2.6 -Stable   Hypothyroidism -Continue Synthroid  Coronary artery disease status post CABG -Continue aspirin, Lipitor  Depression, anxiety -Continue Zoloft, Cymbalta  Dementia -Continue Aricept   DVT prophylaxis:  enoxaparin (LOVENOX) injection 30 mg Start: 08/20/20 1000 SCD's Start: 08/20/20 0810  Code Status: Full code Family Communication: No family at bedside Disposition Plan:  Status is: Inpatient  Remains inpatient appropriate because:Unsafe d/c plan   Dispo: The patient is from: Home              Anticipated d/c is to: SNF              Anticipated d/c date is: 1 day              Patient currently is medically stable to d/c.  Awaiting SNF placement discussed with TOC today.   Consultants:   Neurology  Procedures:   None  Antimicrobials:  Anti-infectives (From admission, onward)   Start     Dose/Rate Route Frequency Ordered Stop   08/21/20 1400  cephALEXin (KEFLEX) capsule 500 mg        500 mg Oral Every  6 hours 08/21/20 1355 08/26/20 1159   08/20/20 1115  cefTRIAXone (ROCEPHIN) 1 g in sodium chloride 0.9 % 100 mL IVPB  Status:  Discontinued        1 g 200 mL/hr over 30 Minutes Intravenous Every 24 hours 08/20/20 1102 08/21/20 1355   08/20/20 1000  cefTRIAXone (ROCEPHIN) 1 g in sodium  chloride 0.9 % 100 mL IVPB  Status:  Discontinued        1 g 200 mL/hr over 30 Minutes Intravenous Every 24 hours 08/20/20 0950 08/20/20 0951   08/20/20 0800  ceFAZolin (ANCEF) IVPB 1 g/50 mL premix        1 g 100 mL/hr over 30 Minutes Intravenous  Once 08/20/20 0757 08/20/20 1224       Objective: Vitals:   08/21/20 2001 08/21/20 2341 08/22/20 0408 08/22/20 0810  BP: (!) 163/77 (!) 176/88 (!) 177/86 (!) 184/80  Pulse: 67 62 65 66  Resp: 16 20 16 20   Temp: 98.2 F (36.8 C) 97.6 F (36.4 C) 98.1 F (36.7 C) (!) 97.4 F (36.3 C)  TempSrc: Oral Oral Oral Oral  SpO2: 96% 96% 96% 100%  Weight:      Height:       No intake or output data in the 24 hours ending 08/22/20 1155 Filed Weights   08/21/20 1841  Weight: 87.7 kg    Examination: General exam: Appears calm and comfortable  Respiratory system: Clear to auscultation. Respiratory effort normal. Cardiovascular system: S1 & S2 heard, RRR. No pedal edema. Gastrointestinal system: Abdomen is nondistended, soft and nontender. Normal bowel sounds heard. Central nervous system: Alert and oriented.  Left upper extremity weaker compared to right Extremities: Symmetric in appearance bilaterally  Skin: No rashes, lesions or ulcers on exposed skin  Psychiatry: Judgement and insight appear stable. Mood & affect appropriate.    Data Reviewed: I have personally reviewed following labs and imaging studies  CBC: Recent Labs  Lab 08/19/20 1419 08/20/20 0910 08/21/20 0414  WBC 8.5 7.6 6.5  NEUTROABS 6.9  --  4.5  HGB 15.4 15.0 14.4  HCT 47.5 46.8 45.1  MCV 88.8 91.4 90.6  PLT 140* 130* 607*   Basic Metabolic Panel: Recent Labs  Lab 08/19/20 1419 08/20/20 0910 08/21/20 0414 08/22/20 0347  NA 136  --  141 140  K 3.8  --  3.7 3.8  CL 103  --  110 110  CO2 24  --  21* 21*  GLUCOSE 471*  --  191* 134*  BUN 38*  --  35* 29*  CREATININE 2.54* 2.45* 2.34* 2.05*  CALCIUM 9.5  --  9.3 9.0   GFR: Estimated Creatinine  Clearance: 29.9 mL/min (A) (by C-G formula based on SCr of 2.05 mg/dL (H)). Liver Function Tests: Recent Labs  Lab 08/19/20 1419  AST 18  ALT 19  ALKPHOS 76  BILITOT 0.8  PROT 5.8*  ALBUMIN 3.3*   No results for input(s): LIPASE, AMYLASE in the last 168 hours. No results for input(s): AMMONIA in the last 168 hours. Coagulation Profile: Recent Labs  Lab 08/19/20 1419  INR 1.1   Cardiac Enzymes: No results for input(s): CKTOTAL, CKMB, CKMBINDEX, TROPONINI in the last 168 hours. BNP (last 3 results) No results for input(s): PROBNP in the last 8760 hours. HbA1C: Recent Labs    08/20/20 0910 08/21/20 0414  HGBA1C 10.7* 10.6*   CBG: Recent Labs  Lab 08/21/20 1257 08/21/20 1813 08/21/20 2102 08/22/20 0627 08/22/20 0810  GLUCAP 319* 109* 168* 126*  111*   Lipid Profile: Recent Labs    08/21/20 0414  CHOL 172  HDL 49  LDLCALC 96  TRIG 136  CHOLHDL 3.5   Thyroid Function Tests: Recent Labs    08/20/20 0910  TSH 4.484   Anemia Panel: No results for input(s): VITAMINB12, FOLATE, FERRITIN, TIBC, IRON, RETICCTPCT in the last 72 hours. Sepsis Labs: No results for input(s): PROCALCITON, LATICACIDVEN in the last 168 hours.  Recent Results (from the past 240 hour(s))  SARS Coronavirus 2 by RT PCR (hospital order, performed in [facility] hospital lab) Nasopharyngeal Nasopharyngeal Swab     Status: None   Collection Time: 08/20/20 12:21 PM   Specimen: Nasopharyngeal Swab  Result Value Ref Range Status   SARS Coronavirus 2 NEGATIVE NEGATIVE Final    Comment: (NOTE) SARS-CoV-2 target nucleic acids are NOT DETECTED.  The SARS-CoV-2 RNA is generally detectable in upper and lower respiratory specimens during the acute phase of infection. The lowest concentration of SARS-CoV-2 viral copies this assay can detect is 250 copies / mL. A negative result does not preclude SARS-CoV-2 infection and should not be used as the sole basis for treatment or other patient  management decisions.  A negative result may occur with improper specimen collection / handling, submission of specimen other than nasopharyngeal swab, presence of viral mutation(s) within the areas targeted by this assay, and inadequate number of viral copies (<250 copies / mL). A negative result must be combined with clinical observations, patient history, and epidemiological information.  Fact Sheet for Patients:   StrictlyIdeas.no  Fact Sheet for Healthcare Providers: BankingDealers.co.za  This test is not yet approved or  cleared by the [place] FDA and has been authorized for detection and/or diagnosis of SARS-CoV-2 by FDA under an Emergency Use Authorization (EUA).  This EUA will remain in effect (meaning this test can be used) for the duration of the COVID-19 declaration under Section 564(b)(1) of the Act, 21 U.S.C. section 360bbb-3(b)(1), unless the authorization is terminated or revoked sooner.  Performed at [facility], [place] [address], [place], [place] [address]       Radiology Studies: MR ANGIO HEAD WO CONTRAST  Result Date: 08/20/2020 CLINICAL DATA:  Stroke, follow-up.  Right thalamic infarct. EXAM: MRA HEAD WITHOUT CONTRAST TECHNIQUE: Angiographic images of the Circle of Willis were obtained using MRA technique without intravenous contrast. COMPARISON:  MR head without contrast 08/29/2020 and CT head without contrast 08/19/2020 FINDINGS: Internal carotid arteries are within normal limits from the high cervical segments through the ICA termini bilaterally. The A1 and M1 segments are normal. MCA bifurcations are intact. ACA and MCA branch vessels are within normal limits. 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 The more distal basilar artery is normal. Both posterior cerebral arteries originate from basilar tip. There is some attenuation of distal PCA branch  vessels bilaterally. The more caudal basilar artery is diminutive. No flow is identified in the vertebral arteries on either side. IMPRESSION: 1. 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 2. No flow identified in the vertebral arteries. 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 CTA of the head and neck could be used for further evaluation if clinically indicated. Electronically Signed   By: San Morelle M.D.   On: 08/20/2020 12:37   ECHOCARDIOGRAM COMPLETE  Result Date: 08/20/2020    ECHOCARDIOGRAM REPORT   Patient Name:  Renaye Rakers Date of Exam: 08/20/2020 Medical Rec #:  [id number]    Height:       70.0 in Accession #:    [id number]   Weight:       221.0 lb Date of Birth:  March 28, 1934    BSA:          2.178 m Patient Age:    76 years     BP:           145/72 mmHg Patient Gender: M            HR:           62 bpm. Exam Location:  Inpatient Procedure: 2D Echo Indications:    Stroke 434.91 / I163.9  History:        Patient has no prior history of Echocardiogram examinations.                 CAD; Risk Factors:Hypertension, Diabetes, Dyslipidemia and                 Former Smoker. Abdominal aortic aneurysm.  Sonographer:    Leavy Cella Referring Phys: [id number]  1. Left ventricular ejection fraction, by estimation, is 65 to 70%. The left ventricle has normal function. The left ventricle has no regional wall motion abnormalities. There is moderate concentric left ventricular hypertrophy. Left ventricular diastolic parameters are consistent with Grade I diastolic dysfunction (impaired relaxation).  2. Right ventricular systolic function is normal. The right ventricular size is normal.  3. The mitral valve is normal in structure. Mild mitral valve regurgitation. No evidence of mitral stenosis.  4. The aortic valve is normal in structure. There is severe calcifcation of the  aortic valve. There is severe thickening of the aortic valve. Aortic valve regurgitation is mild. Mild aortic valve stenosis.  5. The inferior vena cava is normal in size with greater than 50% respiratory variability, suggesting right atrial pressure of 3 mmHg. Conclusion(s)/Recommendation(s): No intracardiac source of embolism detected on this transthoracic study. A transesophageal echocardiogram is recommended to exclude cardiac source of embolism if clinically indicated. FINDINGS  Left Ventricle: Left ventricular ejection fraction, by estimation, is 65 to 70%. The left ventricle has normal function. The left ventricle has no regional wall motion abnormalities. The left ventricular internal cavity size was normal in size. There is  moderate concentric left ventricular hypertrophy. Left ventricular diastolic parameters are consistent with Grade I diastolic dysfunction (impaired relaxation). Normal left ventricular filling pressure. Right Ventricle: The right ventricular size is normal. No increase in right ventricular wall thickness. Right ventricular systolic function is normal. Left Atrium: Left atrial size was normal in size. Right Atrium: Right atrial size was normal in size. Pericardium: There is no evidence of pericardial effusion. Mitral Valve: The mitral valve is normal in structure. Mild mitral valve regurgitation. No evidence of mitral valve stenosis. Tricuspid Valve: The tricuspid valve is normal in structure. Tricuspid valve regurgitation is mild . No evidence of tricuspid stenosis. Aortic Valve: The aortic valve is normal in structure. There is severe calcifcation of the aortic valve. There is severe thickening of the aortic valve. Aortic valve regurgitation is mild. Aortic regurgitation PHT measures 647 msec. Mild aortic stenosis is present. Pulmonic Valve: The pulmonic valve was normal in structure. Pulmonic valve regurgitation is not visualized. No evidence of pulmonic stenosis. Aorta: The aortic  root is normal in size and structure. Venous: The inferior vena cava is normal in size with greater than 50% respiratory  variability, suggesting right atrial pressure of 3 mmHg. IAS/Shunts: No atrial level shunt detected by color flow Doppler.  LEFT VENTRICLE PLAX 2D LVIDd:         4.10 cm Diastology LVIDs:         2.90 cm LV e' medial:    4.79 cm/s LV PW:         1.30 cm LV E/e' medial:  8.9 LV IVS:        1.30 cm LV e' lateral:   9.68 cm/s                        LV E/e' lateral: 4.4  RIGHT VENTRICLE RV S prime:     12.40 cm/s TAPSE (M-mode): 1.4 cm LEFT ATRIUM             Index       RIGHT ATRIUM           Index LA diam:        3.60 cm 1.65 cm/m  RA Area:     12.40 cm LA Vol (A2C):   61.9 ml 28.42 ml/m RA Volume:   26.60 ml  12.21 ml/m LA Vol (A4C):   30.7 ml 14.10 ml/m LA Biplane Vol: 43.9 ml 20.16 ml/m  AORTIC VALVE AI PHT:      647 msec  AORTA Ao Root diam: 3.50 cm MITRAL VALVE MV Area (PHT): 2.39 cm MV Decel Time: 317 msec MV E velocity: 42.40 cm/s MV A velocity: 73.70 cm/s MV E/A ratio:  0.58 Ena Dawley MD Electronically signed by Ena Dawley MD Signature Date/Time: 08/20/2020/3:37:39 PM    Final    VAS US CAROTID  Result Date: 08/20/2020 Carotid Arterial Duplex Study Indications:       CVA. Risk Factors:      Hypertension, hyperlipidemia, Diabetes. Comparison Study:  No prior studies. Performing Technologist: Darlin Coco  Examination Guidelines: A complete evaluation includes B-mode imaging, spectral Doppler, color Doppler, and power Doppler as needed of all accessible portions of each vessel. Bilateral testing is considered an integral part of a complete examination. Limited examinations for reoccurring indications may be performed as noted.  Right Carotid Findings: +----------+--------+--------+--------+------------------+------------------+           PSV cm/sEDV cm/sStenosisPlaque DescriptionComments            +----------+--------+--------+--------+------------------+------------------+ CCA Prox  60      12                                intimal thickening +----------+--------+--------+--------+------------------+------------------+ CCA Distal40      10                                intimal thickening +----------+--------+--------+--------+------------------+------------------+ ICA Prox  42      8       1-39%   heterogenous                         +----------+--------+--------+--------+------------------+------------------+ ICA Distal50      9                                                    +----------+--------+--------+--------+------------------+------------------+ ECA  67                                                           +----------+--------+--------+--------+------------------+------------------+ +----------+--------+-------+----------------+-------------------+           PSV cm/sEDV cmsDescribe        Arm Pressure (mmHG) +----------+--------+-------+----------------+-------------------+ [id number]             Multiphasic, WNL                    +----------+--------+-------+----------------+-------------------+ +---------+--------+--------+--------------+ VertebralPSV cm/sEDV cm/sNot identified +---------+--------+--------+--------------+  Left Carotid Findings: +----------+--------+--------+--------+------------------+------------------+           PSV cm/sEDV cm/sStenosisPlaque DescriptionComments           +----------+--------+--------+--------+------------------+------------------+ CCA Prox  57      10                                intimal thickening +----------+--------+--------+--------+------------------+------------------+ CCA Distal50      11                                intimal thickening +----------+--------+--------+--------+------------------+------------------+ ICA Prox  86      15      1-39%   heterogenous                          +----------+--------+--------+--------+------------------+------------------+ ICA Distal63      16                                                   +----------+--------+--------+--------+------------------+------------------+ ECA       67                                                           +----------+--------+--------+--------+------------------+------------------+ +----------+--------+--------+----------------+-------------------+           PSV cm/sEDV cm/sDescribe        Arm Pressure (mmHG) +----------+--------+--------+----------------+-------------------+ Subclavian101             Multiphasic, WNL                    +----------+--------+--------+----------------+-------------------+ +---------+--------+--+--------+--+---------+ VertebralPSV cm/s61EDV cm/s24Antegrade +---------+--------+--+--------+--+---------+   Summary: Right Carotid: Velocities in the right ICA are consistent with a 1-39% stenosis. Left Carotid: Velocities in the left ICA are consistent with a 1-39% stenosis. Vertebrals:  Left vertebral artery demonstrates antegrade flow. Right vertebral              artery was not visualized. Subclavians: Normal flow hemodynamics were seen in bilateral subclavian              arteries. *See table(s) above for measurements and observations.  Electronically signed by Monica Martinez MD on 08/20/2020 at 1:39:47 PM.    Final       Scheduled Meds: . aspirin EC  81 mg Oral Daily  . atorvastatin  40 mg Oral QPM  . cephALEXin  500 mg Oral Q6H  . clopidogrel  75 mg Oral Daily  . donepezil  10 mg Oral QHS  . DULoxetine  30 mg Oral QHS  . enoxaparin (LOVENOX) injection  30 mg Subcutaneous Daily  . insulin aspart  0-5 Units Subcutaneous QHS  . insulin aspart  0-9 Units Subcutaneous TID WC  . insulin aspart  5 Units Subcutaneous TID WC  . insulin glargine  25 Units Subcutaneous BID  . levothyroxine  75 mcg Oral QAC breakfast  .  sertraline  50 mg Oral Daily  . sodium chloride flush  3 mL Intravenous Once   Continuous Infusions:    LOS: 2 days      Time spent: 25 minutes   Dessa Phi, DO [facility] 08/22/2020, 11:55 AM   Available via Epic secure chat 7am-7pm After these hours, please refer to coverage provider listed on [email or web address]

## 2020-08-22 NOTE — TOC Initial Note (Signed)
Transition of Care [facility]) - Initial/Assessment Note    Patient Details  Name: [name] MRN: [id number] Date of Birth: Oct 31, 1934  Transition of Care [facility]) CM/SW Contact:    Coralee Pesa Phone Number: 08/22/2020, 12:01 PM  Clinical Narrative:                 MSW spoke with pt and daughter Romie Minus, at bedside. Pt agreeable to SNF with the hope of getting stronger. Pt noted that they would like to stay in [place], but has no preference for facility. Pt has had the first covid vaccine, but not the second, as he was supposed to have it this weekend. FL2 completed and faxed out. Insurance auth started. MSW will continue to follow for discharge.  Expected Discharge Plan: Skilled Nursing Facility Barriers to Discharge: Continued Medical Work up, SNF Pending bed offer   Patient Goals and CMS Choice Patient states their goals for this hospitalization and ongoing recovery are:: Pt is agreeable to SNF with the hopes of getting stronger. CMS Medicare.gov Compare Post Acute Care list provided to:: Patient Choice offered to / list presented to : Patient  Expected Discharge Plan and Services Expected Discharge Plan: [place] In-house Referral: Clinical Social Work   Post Acute Care Choice: [place] Living arrangements for the past 2 months: [place]                                      Prior Living Arrangements/Services Living arrangements for the past 2 months: Single Family Home Lives with:: Spouse Patient language and need for interpreter reviewed:: Yes Do you feel safe going back to the place where you live?: Yes      Need for Family Participation in Patient Care: No (Comment) Care giver support system in place?: No (comment)   Criminal Activity/Legal Involvement Pertinent to Current Situation/Hospitalization: No - Comment as needed  Activities of Daily Living      Permission Sought/Granted Permission sought to share information  with : Family Supports Permission granted to share information with : Yes, Verbal Permission Granted  Share Information with NAME: Lelend Heinecke     Permission granted to share info w Relationship: spouse  Permission granted to share info w Contact Information: [phone]  Emotional Assessment Appearance:: Appears stated age Attitude/Demeanor/Rapport: Engaged Affect (typically observed): Pleasant Orientation: : Oriented to Self, Oriented to Place, Oriented to  Time, Oriented to Situation Alcohol / Substance Use: Not Applicable Psych Involvement: No (comment)  Admission diagnosis:  Hyperglycemia [R73.9] Cellulitis of left arm [L03.114] Uncontrolled hypertension [I10] Acute CVA (cerebrovascular accident) ([place]) [I63.9] CRF (chronic renal failure), stage 4 (severe) (HCC) [N18.4] Ischemic stroke [facility]) [I63.9] Patient Active Problem List   Diagnosis Date Noted  . CKD (chronic kidney disease), stage III 08/20/2020  . Ischemic stroke ([place]) 08/20/2020  . Thrombocytopenia ([place]) 08/20/2020  . Hypothyroidism 08/20/2020  . Dementia ([place]) 08/20/2020  . Depression 08/20/2020  . Cellulitis 08/20/2020  . Bilateral foot-drop 01/12/2020  . Diabetic neuropathy ([place]) 09/16/2017  . Gait abnormality 09/16/2017  . Memory difficulty 03/06/2017  . GIB (gastrointestinal bleeding) 03/31/2016  . Rectal bleeding   . Chronic kidney disease, stage IV (severe) ([place]) 03/23/2015  . Coronary artery disease 03/22/2014  . Essential hypertension 03/22/2014  . Hyperlipidemia 03/22/2014  . Multinodular goiter 03/22/2014  . DM (diabetes mellitus) ([place]) 03/22/2014  . Abdominal aortic aneurysm ([place]) 03/04/2012   PCP:  Hulan Fess, MD Pharmacy:   [facility] [place] [address] Phone: [phone] Fax: [phone]     Social Determinants of Health (SDOH) Interventions    Readmission Risk Interventions No flowsheet data  found.

## 2020-08-22 NOTE — Evaluation (Signed)
Speech Language Pathology Evaluation Patient Details Name: [name] MRN: [id number] DOB: 1934/06/12 Today's Date: 08/22/2020 Time: 8250-0370 SLP Time Calculation (min) (ACUTE ONLY): 30 min  Problem List:  Patient Active Problem List   Diagnosis Date Noted  . CKD (chronic kidney disease), stage III 08/20/2020  . Ischemic stroke ([place]) 08/20/2020  . Thrombocytopenia ([place]) 08/20/2020  . Hypothyroidism 08/20/2020  . Dementia ([place]) 08/20/2020  . Depression 08/20/2020  . Cellulitis 08/20/2020  . Bilateral foot-drop 01/12/2020  . Diabetic neuropathy ([place]) 09/16/2017  . Gait abnormality 09/16/2017  . Memory difficulty 03/06/2017  . GIB (gastrointestinal bleeding) 03/31/2016  . Rectal bleeding   . Chronic kidney disease, stage IV (severe) ([place]) 03/23/2015  . Coronary artery disease 03/22/2014  . Essential hypertension 03/22/2014  . Hyperlipidemia 03/22/2014  . Multinodular goiter 03/22/2014  . DM (diabetes mellitus) ([place]) 03/22/2014  . Abdominal aortic aneurysm ([place]) 03/04/2012   Past Medical History:  Past Medical History:  Diagnosis Date  . Bilateral foot-drop 01/12/2020  . Coronary artery disease   . Diabetes mellitus without complication ([place])   . Diabetic neuropathy ([place]) 09/16/2017   Mild bilateral foot drops  . Gait abnormality 09/16/2017  . Hyperlipidemia   . Hypertension   . Memory difficulty 03/06/2017  . Toxic goiter    Past Surgical History:  Past Surgical History:  Procedure Laterality Date  . ABDOMINAL AORTIC ANEURYSM REPAIR    . cardiac bypass    . CORONARY ARTERY BYPASS GRAFT     HPI:  Pt is an 86 y/o male admitted secondary to multiple falls and L sided weakness. Found to have R thalamic infarct. PMH inlucdes CAD s/p CABG, HTN, DM, CKD, and dementia.   Assessment / Plan / Recommendation Clinical Impression  Patient presents with a mild cognitive impairment with main deficits in short term memory and delayed recall, but with patient having reported h/o  dementia. Patient received a score of 21 on SLUMS ([place]) exam, placing him in the category of Mild Neurocognitive Disorder. Although he would benefit from some skilled SLP treatment for cognitive-linguistic abilities prior to discharge home, this can be completed at next venue of care (inpatient rehab vs SNF).    SLP Assessment  SLP Recommendation/Assessment: All further Speech Lanaguage Pathology  needs can be addressed in the next venue of care SLP Visit Diagnosis: Cognitive communication deficit (R41.841)    Follow Up Recommendations  Inpatient Rehab;Skilled Nursing facility    Frequency and Duration     N/A      SLP Evaluation Cognition  Overall Cognitive Status: History of cognitive impairments - at baseline Orientation Level: Oriented X4 Memory: Impaired Memory Impairment: Storage deficit;Other (comment) (delayed recall of 3/5 object words) Safety/Judgment: Appears intact       Comprehension  Auditory Comprehension Overall Auditory Comprehension: Appears within functional limits for tasks assessed    Expression Expression Primary Mode of Expression: Verbal Written Expression Dominant Hand: Right   Oral / Motor  Oral Motor/Sensory Function Overall Oral Motor/Sensory Function: Mild impairment Facial ROM: Reduced left Facial Symmetry: Abnormal symmetry left Facial Strength: Within Functional Limits Facial Sensation: Within Functional Limits Lingual ROM: Within Functional Limits Lingual Symmetry: Within Functional Limits Lingual Strength: Within Functional Limits Motor Speech Overall Motor Speech: Appears within functional limits for tasks assessed Respiration: Within functional limits Phonation: Normal Resonance: Within functional limits Articulation: Within functional limitis Intelligibility: Intelligible Motor Planning: Witnin functional limits Motor Speech Errors: Not applicable   GO  Sonia Baller, MA,  CCC-SLP 08/22/20 5:20 PM

## 2020-08-22 NOTE — Progress Notes (Signed)
Inpatient Diabetes Program Recommendations  AACE/ADA: New Consensus Statement on Inpatient Glycemic Control (2015)  Target Ranges:  Prepandial:   less than 140 mg/dL      Peak postprandial:   less than 180 mg/dL (1-2 hours)      Critically ill patients:  140 - 180 mg/dL   Results for [name], [name] (MRN [id number]) as of 08/22/2020 12:05  Ref. Range 08/22/2020 06:27 08/22/2020 08:10  Glucose-Capillary Latest Ref Range: 70 - 99 mg/dL 126 (H) 111 (H)   Results for [name], [name] (MRN [id number]) as of 08/22/2020 12:05  Ref. Range 08/21/2020 04:14  Hemoglobin A1C Latest Ref Range: 4.8 - 5.6 % 10.6 (H)     Home DM Meds: Lantus 25-30 units BID       Novolog 0-15 units TID with meals   Current Orders: Lantus 25 units BID      Novolog Sensitive Correction Scale/ SSI (0-9 units) TID AC + HS      Novolog 5 units TID with meals    Met with pt at bedside this AM.  Pt A&O and able to hold meaningful conversation.  Reviewed current A1c of 10.36% with pt.  Explained what an A1c is and what it measures and also explained that we like for his A1c to be closer to 7% to prevent further complications.  Pt told me his last A1c was 12% but was unsure when that was taken.  Verified home meds listed above.  Stated his PCP assists with diabetes management.  PCP listed as Dr. Hulan Fess with [facility].  States he has CBG meter at home and checks TID before meals.  Doses his Novolog on a scale at home.  Strongly encouraged pt to check his CBGs TID before meals at home and to take his insulin as prescribed.  Also encouraged pt to make sure to follow up with his PCP after he goes home (from Rehab) for further diabetes management.  Asked pt if I could call his wife and give her an update on his CBGs--Pt gave me permission to call her.  Attempted to call wife on home and cell number.  Was unable to reach her.  Left her a voicemail with my contact info to call me if she has additional diabetes questions.    --Will follow  patient during hospitalization--  Wyn Quaker RN, MSN, CDE Diabetes Coordinator Inpatient Glycemic Control Team Team Pager: [phone] (8a-5p)

## 2020-08-23 DIAGNOSIS — F0390 Unspecified dementia without behavioral disturbance: Secondary | ICD-10-CM

## 2020-08-23 DIAGNOSIS — Z794 Long term (current) use of insulin: Secondary | ICD-10-CM

## 2020-08-23 DIAGNOSIS — E1122 Type 2 diabetes mellitus with diabetic chronic kidney disease: Secondary | ICD-10-CM

## 2020-08-23 DIAGNOSIS — N184 Chronic kidney disease, stage 4 (severe): Secondary | ICD-10-CM

## 2020-08-23 LAB — BASIC METABOLIC PANEL
Anion gap: 8 (ref 5–15)
BUN: 34 mg/dL — ABNORMAL HIGH (ref 8–23)
CO2: 25 mmol/L (ref 22–32)
Calcium: 9.1 mg/dL (ref 8.9–10.3)
Chloride: 108 mmol/L (ref 98–111)
Creatinine, Ser: 2.35 mg/dL — ABNORMAL HIGH (ref 0.61–1.24)
GFR calc Af Amer: 28 mL/min — ABNORMAL LOW (ref >60)
GFR calc non Af Amer: 24 mL/min — ABNORMAL LOW (ref >60)
Glucose, Bld: 108 mg/dL — ABNORMAL HIGH (ref 70–99)
Potassium: 3.3 mmol/L — ABNORMAL LOW (ref 3.5–5.1)
Sodium: 141 mmol/L (ref 135–145)

## 2020-08-23 LAB — GLUCOSE, CAPILLARY
Glucose-Capillary: 85 mg/dL (ref 70–99)
Glucose-Capillary: 145 mg/dL — ABNORMAL HIGH (ref 70–99)
Glucose-Capillary: 69 mg/dL — ABNORMAL LOW (ref 70–99)
Glucose-Capillary: 235 mg/dL — ABNORMAL HIGH (ref 70–99)
Glucose-Capillary: 214 mg/dL — ABNORMAL HIGH (ref 70–99)
Glucose-Capillary: 255 mg/dL — ABNORMAL HIGH (ref 70–99)

## 2020-08-23 MED ORDER — POTASSIUM CHLORIDE CRYS ER 20 MEQ PO TBCR
20.0000 meq | EXTENDED_RELEASE_TABLET | Freq: Once | ORAL | Status: AC
  Administered 2020-08-23: 20 meq via ORAL
  Filled 2020-08-23: qty 1

## 2020-08-23 MED ORDER — INSULIN GLARGINE 100 UNIT/ML ~~LOC~~ SOLN
25.0000 [IU] | Freq: Every day | SUBCUTANEOUS | Status: DC
  Administered 2020-08-24 – 2020-08-30 (×7): 25 [IU] via SUBCUTANEOUS
  Filled 2020-08-23 (×7): qty 0.25

## 2020-08-23 NOTE — Progress Notes (Signed)
PROGRESS NOTE    [name]  ZOX:[id number] DOB: 09-Jul-1934 DOA: 08/19/2020 PCP: Hulan Fess, MD     Brief Narrative:  [name] is an 86 y.o. male with medical history significant of coronary artery disease status post CABG, hypertension, hyperlipidemia, type 2 diabetes mellitus, CKD stage IIIb, hypothyroidism, depression/anxiety, memory difficulty, chronic bilateral leg swelling, AAA status post repair, who presents to emergency department with recurrent falls and left-sided weakness started 2 days ago.  Rash on left arm which he noticed yesterday. Patient reports left-sided weakness which started on Thursday evening.  He typically ambulates with cane at baseline and fell 3 times on Thursday and 2 times on Friday.  Reports difficulty with ambulation due to his left leg giving out.  CT head shows small focus of ill-defined hypoattenuation within the right thalamus capsular junction which is new as compared to the prior head CT and likely reflects an acute/early subacute lacunar infarct.  Neurology was consulted and patient admitted for stroke work-up.  Subjective: Patient continues to have weakness on the left side.  Denies any other complaints.    Assessment & Plan:   Principal Problem:   Ischemic stroke [facility]) Active Problems:   Coronary artery disease   Essential hypertension   Hyperlipidemia   DM (diabetes mellitus) (HCC)   Chronic kidney disease, stage IV (severe) (HCC)   Thrombocytopenia (HCC)   Hypothyroidism   Dementia (HCC)   Depression   Cellulitis   Acute right thalamus and internal capsule CVA -CT head: Small focus of ill-defined hypoattenuation within the right thalamocapsular junction which is new as compared to the prior head CT of 03/18/2020 and likely reflects an acute/early subacute lacunar infarct given the provided history. -MRI brain: Acute/subacute 13 mm infarct involving the lateral right thalamus and internal capsule with small focus of central petechial  hemorrhage. -Echo: No intracardiac source of embolism detected on this transthoracic study -Carotid duplex US: 1-39% stenosis bilaterally   LDL of 96.  HbA1c 10.6. -Continue aspirin and Plavix for 3 weeks, followed by Plavix alone -Lipitor -Follow-up with neurology in 6 weeks -PT OT recommending SNF placement  Left elbow cellulitis -Continue Keflex Seems to be improving  Essential hypertension Permissive hypertension was allowed.  Restarted patient's metoprolol.  Also noted to be on furosemide at home.  Currently on hold.  Diabetes mellitus type 2, uncontrolled with hyperglycemia -Hemoglobin A1c 10.6 -Continue Lantus, sliding scale insulin Monitor CBGs.  Seems to be reasonably well controlled.  Hyperlipidemia -Continue Lipitor.  LDL of 96.  CKD stage IV/hypokalemia -Baseline creatinine 2.6 Replace potassium.  Monitor urine output.  Noted to be on Lasix at home which is currently held due to permissive hypertension.  Hypothyroidism -Continue Synthroid  Coronary artery disease status post CABG Stable.  Continue beta-blocker and platelet agents and statin.  Depression, anxiety -Continue Zoloft, Cymbalta  Dementia -Continue Aricept   DVT prophylaxis: Lovenox Code Status: Full code Family Communication: No family at bedside Disposition Plan: SNF Status is: Inpatient  Remains inpatient appropriate because:Unsafe d/c plan   Dispo: The patient is from: Home              Anticipated d/c is to: SNF              Anticipated d/c date is: 1 day              Patient currently is medically stable to d/c.  Awaiting SNF placement discussed with TOC today.   Consultants:   Neurology  Procedures:  None  Antimicrobials:  Anti-infectives (From admission, onward)   Start     Dose/Rate Route Frequency Ordered Stop   08/21/20 1400  cephALEXin (KEFLEX) capsule 500 mg        500 mg Oral Every 6 hours 08/21/20 1355 08/26/20 1159   08/20/20 1115  cefTRIAXone (ROCEPHIN) 1 g  in sodium chloride 0.9 % 100 mL IVPB  Status:  Discontinued        1 g 200 mL/hr over 30 Minutes Intravenous Every 24 hours 08/20/20 1102 08/21/20 1355   08/20/20 1000  cefTRIAXone (ROCEPHIN) 1 g in sodium chloride 0.9 % 100 mL IVPB  Status:  Discontinued        1 g 200 mL/hr over 30 Minutes Intravenous Every 24 hours 08/20/20 0950 08/20/20 0951   08/20/20 0800  ceFAZolin (ANCEF) IVPB 1 g/50 mL premix        1 g 100 mL/hr over 30 Minutes Intravenous  Once 08/20/20 0757 08/20/20 1224       Objective: Vitals:   08/22/20 2112 08/22/20 2352 08/23/20 0346 08/23/20 0745  BP: (!) 164/81 (!) 196/96 (!) 195/87 (!) 166/76  Pulse: 69 70 (!) 59 78  Resp: 16 16 16 16   Temp: 98.6 F (37 C) 98.3 F (36.8 C) 98 F (36.7 C) 98.4 F (36.9 C)  TempSrc: Oral Oral Oral Oral  SpO2: 97% 97% 97% 98%  Weight:      Height:        Intake/Output Summary (Last 24 hours) at 08/23/2020 1224 Last data filed at 08/22/2020 1818 Gross per 24 hour  Intake 117 ml  Output 425 ml  Net -308 ml   Filed Weights   08/21/20 1841  Weight: 87.7 kg    Examination:  General appearance: Awake alert.  In no distress.  Mildly distracted. Resp: Clear to auscultation bilaterally.  Normal effort Cardio: S1-S2 is normal regular.  No S3-S4.  No rubs murmurs or bruit GI: Abdomen is soft.  Nontender nondistended.  Bowel sounds are present normal.  No masses organomegaly Extremities: No erythema noted over the elbow.  Good range of motion though noted to be weak due to his stroke. Neurologic: Disoriented..  Left-sided weakness noted.    Data Reviewed: I have personally reviewed following labs and imaging studies  CBC: Recent Labs  Lab 08/19/20 1419 08/20/20 0910 08/21/20 0414  WBC 8.5 7.6 6.5  NEUTROABS 6.9  --  4.5  HGB 15.4 15.0 14.4  HCT 47.5 46.8 45.1  MCV 88.8 91.4 90.6  PLT 140* 130* 976*   Basic Metabolic Panel: Recent Labs  Lab 08/19/20 1419 08/20/20 0910 08/21/20 0414 08/22/20 0347  08/23/20 0215  NA 136  --  141 140 141  K 3.8  --  3.7 3.8 3.3*  CL 103  --  110 110 108  CO2 24  --  21* 21* 25  GLUCOSE 471*  --  191* 134* 108*  BUN 38*  --  35* 29* 34*  CREATININE 2.54* 2.45* 2.34* 2.05* 2.35*  CALCIUM 9.5  --  9.3 9.0 9.1   GFR: Estimated Creatinine Clearance: 26.1 mL/min (A) (by C-G formula based on SCr of 2.35 mg/dL (H)). Liver Function Tests: Recent Labs  Lab 08/19/20 1419  AST 18  ALT 19  ALKPHOS 76  BILITOT 0.8  PROT 5.8*  ALBUMIN 3.3*   Coagulation Profile: Recent Labs  Lab 08/19/20 1419  INR 1.1   HbA1C: Recent Labs    08/21/20 0414  HGBA1C 10.6*   CBG:  Recent Labs  Lab 08/22/20 1612 08/22/20 2146 08/23/20 0404 08/23/20 0630 08/23/20 0703  GLUCAP 123* 91 85 69* 145*   Lipid Profile: Recent Labs    08/21/20 0414  CHOL 172  HDL 49  LDLCALC 96  TRIG 136  CHOLHDL 3.5     Recent Results (from the past 240 hour(s))  SARS Coronavirus 2 by RT PCR (hospital order, performed in [facility] hospital lab) Nasopharyngeal Nasopharyngeal Swab     Status: None   Collection Time: 08/20/20 12:21 PM   Specimen: Nasopharyngeal Swab  Result Value Ref Range Status   SARS Coronavirus 2 NEGATIVE NEGATIVE Final    Comment: (NOTE) SARS-CoV-2 target nucleic acids are NOT DETECTED.  The SARS-CoV-2 RNA is generally detectable in upper and lower respiratory specimens during the acute phase of infection. The lowest concentration of SARS-CoV-2 viral copies this assay can detect is 250 copies / mL. A negative result does not preclude SARS-CoV-2 infection and should not be used as the sole basis for treatment or other patient management decisions.  A negative result may occur with improper specimen collection / handling, submission of specimen other than nasopharyngeal swab, presence of viral mutation(s) within the areas targeted by this assay, and inadequate number of viral copies (<250 copies / mL). A negative result must be combined with  clinical observations, patient history, and epidemiological information.  Fact Sheet for Patients:   StrictlyIdeas.no  Fact Sheet for Healthcare Providers: BankingDealers.co.za  This test is not yet approved or  cleared by the [place] FDA and has been authorized for detection and/or diagnosis of SARS-CoV-2 by FDA under an Emergency Use Authorization (EUA).  This EUA will remain in effect (meaning this test can be used) for the duration of the COVID-19 declaration under Section 564(b)(1) of the Act, 21 U.S.C. section 360bbb-3(b)(1), unless the authorization is terminated or revoked sooner.  Performed at [facility], [place] [address] [place], [place] [address]       Radiology Studies: No results found.    Scheduled Meds: . aspirin EC  81 mg Oral Daily  . atorvastatin  40 mg Oral QPM  . cephALEXin  500 mg Oral Q6H  . clopidogrel  75 mg Oral Daily  . donepezil  10 mg Oral QHS  . DULoxetine  30 mg Oral QHS  . enoxaparin (LOVENOX) injection  30 mg Subcutaneous Daily  . insulin aspart  0-5 Units Subcutaneous QHS  . [START ON 08/24/2020] insulin glargine  25 Units Subcutaneous Daily  . levothyroxine  75 mcg Oral QAC breakfast  . metoprolol succinate  25 mg Oral Daily  . sertraline  50 mg Oral Daily  . sodium chloride flush  3 mL Intravenous Once   Continuous Infusions:    LOS: 3 days      Bonnielee Haff, [facility] 08/23/2020, 12:24 PM

## 2020-08-23 NOTE — Progress Notes (Signed)
Inpatient Diabetes Program Recommendations  AACE/ADA: New Consensus Statement on Inpatient Glycemic Control   Target Ranges:  Prepandial:   less than 140 mg/dL      Peak postprandial:   less than 180 mg/dL (1-2 hours)      Critically ill patients:  140 - 180 mg/dL   Results for [name], [name] (MRN [id number]) as of 08/23/2020 10:24  Ref. Range 08/22/2020 08:10 08/22/2020 12:32 08/22/2020 16:12 08/22/2020 21:46 08/23/2020 04:04 08/23/2020 06:30 08/23/2020 07:03  Glucose-Capillary Latest Ref Range: 70 - 99 mg/dL 111 (H)  Novolog 5 units  Lantus 25 units 132 (H)  Novolog 6 units 123 (H)  Novolog 6 units 91     Lantus 25 units 85 69 (L) 145 (H)   Review of Glycemic Control  Diabetes history: DM2 Outpatient Diabetes medications: Lantus 25-30 units BID, Novolog 0-15 units TID with meals Current orders for Inpatient glycemic control: Lantus 25 units daily, Novolog 0-5 units QHS  Inpatient Diabetes Program Recommendations:    Insulin: Please consider changing Lantus to 22 units BID and adding back Novolog 0-9 units TID with meals.  If post prandial glucose becomes consistently elevated, will likely need to add back Novolog meal coverage.  NOTE:  Noted Lantus changed from 25 units BID to 25 units daily, Novolog TID correction scale discontinued, and Novolog meal coverage discontinued as well. Patient received Lantus 25 units BID on 9/13 and 9/14. Fasting glucose 69 mg/dl at 6:30 am today.   Thanks, Barnie Alderman, RN, MSN, CDE Diabetes Coordinator Inpatient Diabetes Program [phone] (Team Pager from 8am to 5pm)

## 2020-08-23 NOTE — TOC Progression Note (Signed)
Transition of Care [facility]) - Progression Note    Patient Details  Name: [name] MRN: [id number] Date of Birth: 23-Oct-1934  Transition of Care [facility]) CM/SW [place], [place] Phone Number: 08/23/2020, 11:21 AM  Clinical Narrative:   CSW met with patient and son at bedside to provide bed offers. Son to discuss with patient's other children to determine preference, and will update CSW. Insurance auth still pending.    Expected Discharge Plan: [place] Barriers to Discharge: Continued Medical Work up, SNF Pending bed offer  Expected Discharge Plan and Services Expected Discharge Plan: [place] In-house Referral: Clinical Social Work   Post Acute Care Choice: [place] Living arrangements for the past 2 months: Single Family Home                                       Social Determinants of Health (SDOH) Interventions    Readmission Risk Interventions No flowsheet data found.

## 2020-08-23 NOTE — Progress Notes (Signed)
Physical Therapy Treatment Patient Details Name: [name] MRN: [id number] DOB: 1934/04/09 Today's Date: 08/23/2020    History of Present Illness Pt is an 86 y/o male admitted secondary to multiple falls and L sided weakness. Found to have R thalamic infarct. PMH inlucdes CAD s/p CABG, HTN, DM, CKD, and dementia.     PT Comments    Pt progressing slowly toward goals.  Emphasis on transitions with hand over hand use of L UE, scooting, sit to stand, Pre gait prior to gait training in the RW   Follow Up Recommendations  SNF;Supervision/Assistance - 24 hour     Equipment Recommendations  None recommended by PT    Recommendations for Other Services       Precautions / Restrictions Precautions Precautions: Fall Precaution Comments: Reports at least 5-6 falls within the past few days.     Mobility  Bed Mobility Overal bed mobility: Needs Assistance Bed Mobility: Supine to Sit     Supine to sit: Mod assist (flat HOB)     General bed mobility comments: assist up via R elbow with stability while pt given time to get R UE in place to assist  Transfers Overall transfer level: Needs assistance Equipment used: Rolling walker (2 wheeled) Transfers: Sit to/from Stand Sit to Stand: Mod assist;+2 safety/equipment         General transfer comment: min stability assist for symmetrical scoot, cues for hand placement, mod assist to come forward and up.  Ambulation/Gait Ambulation/Gait assistance: Mod assist Gait Distance (Feet): 35 Feet Assistive device: Rolling walker (2 wheeled) Gait Pattern/deviations: Step-through pattern;Step-to pattern   Gait velocity interpretation: <1.31 ft/sec, indicative of household ambulator General Gait Details: unsteady paretic gait.  Stability assist and control of RW and pt's L hand on the RW.  Worked on toe off and assisted swing though.   Stairs             Wheelchair Mobility    Modified Rankin (Stroke Patients Only) Modified  Rankin (Stroke Patients Only) Modified Rankin: Moderately severe disability     Balance Overall balance assessment: Needs assistance Sitting-balance support: No upper extremity supported;Feet supported Sitting balance-Leahy Scale: Fair     Standing balance support: Bilateral upper extremity supported;During functional activity Standing balance-Leahy Scale: Poor Standing balance comment: UE assist on RW and reliant on external assist                            Cognition Arousal/Alertness: Awake/alert Behavior During Therapy: WFL for tasks assessed/performed Overall Cognitive Status: History of cognitive impairments - at baseline                                        Exercises Other Exercises Other Exercises: warm up bil LE ROM exercise with graded resistance and work on control with L LE    General Comments General comments (skin integrity, edema, etc.): Son present.      Pertinent Vitals/Pain Pain Assessment: Faces Faces Pain Scale: No hurt Pain Intervention(s): Monitored during session    Home Living                      Prior Function            PT Goals (current goals can now be found in the care plan section) Acute Rehab PT Goals Patient Stated Goal: per  daughter want patient to be with wife PT Goal Formulation: With patient/family Time For Goal Achievement: 09/04/20 Potential to Achieve Goals: Good Progress towards PT goals: Progressing toward goals    Frequency    Min 3X/week      PT Plan Current plan remains appropriate    Co-evaluation              AM-PAC PT "6 Clicks" Mobility   Outcome Measure  Help needed turning from your back to your side while in a flat bed without using bedrails?: A Lot Help needed moving from lying on your back to sitting on the side of a flat bed without using bedrails?: A Lot Help needed moving to and from a bed to a chair (including a wheelchair)?: A Lot Help needed  standing up from a chair using your arms (e.g., wheelchair or bedside chair)?: A Lot Help needed to walk in hospital room?: A Lot Help needed climbing 3-5 steps with a railing? : Total 6 Click Score: 11    End of Session Equipment Utilized During Treatment: Gait belt Activity Tolerance: Patient tolerated treatment well Patient left: in chair;with call bell/phone within reach;with chair alarm set;with family/visitor present Nurse Communication: Mobility status PT Visit Diagnosis: Unsteadiness on feet (R26.81);Muscle weakness (generalized) (M62.81);Difficulty in walking, not elsewhere classified (R26.2);History of falling (Z91.81);Repeated falls (R29.6)     Time: 1601-0932 PT Time Calculation (min) (ACUTE ONLY): 27 min  Charges:  $Gait Training: 8-22 mins $Therapeutic Activity: 8-22 mins                     08/23/2020  Ginger Carne., PT Acute Rehabilitation Services [phone]  (pager) [phone]  (office)   Tessie Fass Wesson Stith 08/23/2020, 6:13 PM

## 2020-08-24 DIAGNOSIS — I639 Cerebral infarction, unspecified: Secondary | ICD-10-CM

## 2020-08-24 LAB — BASIC METABOLIC PANEL
Anion gap: 8 (ref 5–15)
BUN: 32 mg/dL — ABNORMAL HIGH (ref 8–23)
CO2: 24 mmol/L (ref 22–32)
Calcium: 9.6 mg/dL (ref 8.9–10.3)
Chloride: 110 mmol/L (ref 98–111)
Creatinine, Ser: 2.08 mg/dL — ABNORMAL HIGH (ref 0.61–1.24)
GFR calc Af Amer: 33 mL/min — ABNORMAL LOW (ref >60)
GFR calc non Af Amer: 28 mL/min — ABNORMAL LOW (ref >60)
Glucose, Bld: 166 mg/dL — ABNORMAL HIGH (ref 70–99)
Potassium: 3.8 mmol/L (ref 3.5–5.1)
Sodium: 142 mmol/L (ref 135–145)

## 2020-08-24 LAB — GLUCOSE, CAPILLARY
Glucose-Capillary: 107 mg/dL — ABNORMAL HIGH (ref 70–99)
Glucose-Capillary: 144 mg/dL — ABNORMAL HIGH (ref 70–99)
Glucose-Capillary: 203 mg/dL — ABNORMAL HIGH (ref 70–99)
Glucose-Capillary: 293 mg/dL — ABNORMAL HIGH (ref 70–99)

## 2020-08-24 LAB — SARS CORONAVIRUS 2 BY RT PCR (HOSPITAL ORDER, PERFORMED IN ~~LOC~~ HOSPITAL LAB): SARS Coronavirus 2: NEGATIVE

## 2020-08-24 MED ORDER — CLOPIDOGREL BISULFATE 75 MG PO TABS: 75.0000 mg | ORAL_TABLET | Freq: Every day | ORAL | Status: AC

## 2020-08-24 MED ORDER — SACCHAROMYCES BOULARDII 250 MG PO CAPS: 250.0000 mg | ORAL_CAPSULE | Freq: Two times a day (BID) | ORAL | Status: AC

## 2020-08-24 MED ORDER — SACCHAROMYCES BOULARDII 250 MG PO CAPS
250.0000 mg | ORAL_CAPSULE | Freq: Two times a day (BID) | ORAL | Status: DC
  Administered 2020-08-24 – 2020-08-30 (×13): 250 mg via ORAL
  Filled 2020-08-24 (×13): qty 1

## 2020-08-24 MED ORDER — ASPIRIN EC 81 MG PO TBEC: 81.0000 mg | DELAYED_RELEASE_TABLET | Freq: Every day | ORAL | 0 refills | Status: AC

## 2020-08-24 MED ORDER — POTASSIUM CHLORIDE CRYS ER 20 MEQ PO TBCR
20.0000 meq | EXTENDED_RELEASE_TABLET | Freq: Once | ORAL | Status: AC
  Administered 2020-08-24: 20 meq via ORAL
  Filled 2020-08-24: qty 1

## 2020-08-24 NOTE — Progress Notes (Signed)
Inpatient Diabetes Program Recommendations  AACE/ADA: New Consensus Statement on Inpatient Glycemic Control   Target Ranges:  Prepandial:   less than 140 mg/dL      Peak postprandial:   less than 180 mg/dL (1-2 hours)      Critically ill patients:  140 - 180 mg/dL   Results for CHON, BUHL (MRN [id number]) as of 08/24/2020 07:58  Ref. Range 08/23/2020 06:30 08/23/2020 07:03 08/23/2020 12:35 08/23/2020 16:46 08/23/2020 20:42 08/24/2020 06:22  Glucose-Capillary Latest Ref Range: 70 - 99 mg/dL 69 (L) 145 (H) 235 (H) 214 (H) 255 (H) 107 (H)   Review of Glycemic Control  Diabetes history:DM2 Outpatient Diabetes medications:Lantus 25-30 units BID, Novolog 0-15 units TID with meals Current orders for Inpatient glycemic control:Lantus 25 units daily, Novolog 0-5 units QHS  Inpatient Diabetes Program Recommendations:    Insulin: Noted Lantus 25 units daily ordered 08/23/20 to start 08/24/20. Therefore, patient did NOT receive any Lantus on 08/23/20 and fasting glucose 107 mg/dl today. May want to consider decreasing Lantus. Please consider ordering Novolog 0-9 units TID with meals for correction.  Thanks, Barnie Alderman, RN, MSN, CDE Diabetes Coordinator Inpatient Diabetes Program [phone] (Team Pager from 8am to 5pm)

## 2020-08-24 NOTE — Consult Note (Signed)
Physical Medicine and Rehabilitation Consult Reason for Consult: Left side weakness Referring Physician: [facility]   HPI: [name] is a 86 y.o. right-handed male with history of diabetes as well as diabetic neuropathy with bilateral foot drop, CAD/CABG, hyperlipidemia, CKD, hypertension, memory difficulty maintained on Aricept.  Per chart review patient lives with family.  Independent with assistive device planning on moving to assisted living facility.  Presented 08/21/2019 while left-sided weakness.  Cranial CT scan showed small focus of ill-defined hypoattenuation within the right thalamocapsular junction which was new compared to prior scan of 03/18/2020.  Redemonstrated chronic lacunar infarct in the right basal ganglia right internal capsule.  MRI showed acute subacute 13 mm infarct involving the lateral right thalamus and internal capsule small focus of central petechial hemorrhage.  Patient did not receive TPA.  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  Admission chemistries glucose 471, BUN 38, creatinine 2.54, hemoglobin A1c 10.7, urinalysis negative nitrite.  Echo with ejection fraction of 65 to 70% no wall motion abnormalities grade 1 diastolic dysfunction.  Currently maintained on aspirin and Plavix for CVA prophylaxis.  Subcutaneous Lovenox for DVT prophylaxis.  There was noted some left elbow cellulitis placed on Keflex.  Therapy evaluations completed with recommendations of physical medicine rehab consult.   Review of Systems  Constitutional: Negative for chills and fever.  HENT: Negative for hearing loss.   Eyes: Negative for blurred vision and double vision.  Respiratory: Negative for cough and shortness of breath.   Cardiovascular: Negative for chest pain and leg swelling.  Gastrointestinal: Positive for constipation. Negative for heartburn, nausea and vomiting.  Genitourinary: Negative for dysuria, flank pain and hematuria.    Musculoskeletal: Positive for myalgias.  Skin: Negative for rash.  Neurological: Positive for weakness.  Psychiatric/Behavioral: Positive for memory loss.  All other systems reviewed and are negative.  Past Medical History:  Diagnosis Date  . Bilateral foot-drop 01/12/2020  . Coronary artery disease   . Diabetes mellitus without complication ([place])   . Diabetic neuropathy ([place]) 09/16/2017   Mild bilateral foot drops  . Gait abnormality 09/16/2017  . Hyperlipidemia   . Hypertension   . Memory difficulty 03/06/2017  . Toxic goiter    Past Surgical History:  Procedure Laterality Date  . ABDOMINAL AORTIC ANEURYSM REPAIR    . cardiac bypass    . CORONARY ARTERY BYPASS GRAFT     Family History  Problem Relation Age of Onset  . Stroke Mother   . Stroke Father   . Heart attack Father   . Diabetes Sister   . Heart attack Sister   . Diabetes Brother   . Heart attack Brother   . Diabetes Brother   . Diabetes Brother   . Heart attack Brother    Social History:  reports that he quit smoking about 24 years ago. He smoked 1.00 pack per day. He has never used smokeless tobacco. He reports that he does not drink alcohol and does not use drugs. Allergies: No Known Allergies Medications Prior to Admission  Medication Sig Dispense Refill  . acetaminophen (TYLENOL) 325 MG tablet Take 650 mg by mouth every 6 (six) hours as needed for mild pain, fever or headache.    Marland Kitchen atorvastatin (LIPITOR) 40 MG tablet Take 40 mg by mouth every evening.     . calcitRIOL (ROCALTROL) 0.25 MCG capsule Take 0.25 mcg by mouth in the morning and at bedtime.     . donepezil (ARICEPT)  10 MG tablet TAKE 1 TABLET BY MOUTH ONCE DAILY AT BEDTIME (Patient taking differently: Take 10 mg by mouth at bedtime. ) 90 tablet 0  . DULoxetine (CYMBALTA) 30 MG capsule Take 1 capsule (30 mg total) by mouth at bedtime. 30 capsule 3  . furosemide (LASIX) 80 MG tablet Take 80 mg by mouth 2 (two) times daily.     . insulin aspart  (NOVOLOG) 100 UNIT/ML injection Inject 0-15 Units into the skin 3 (three) times daily before meals. Sliding scale    . insulin glargine (LANTUS SOLOSTAR) 100 UNIT/ML Solostar Pen Inject 25-30 Units into the skin 2 (two) times daily.    Marland Kitchen ketoconazole (NIZORAL) 2 % cream Apply 1 application topically daily.    Marland Kitchen levothyroxine (SYNTHROID, LEVOTHROID) 75 MCG tablet Take 75 mcg by mouth daily before breakfast.    . loratadine (CLARITIN) 10 MG tablet Take 10 mg by mouth daily as needed for allergies.     . metoprolol succinate (TOPROL-XL) 25 MG 24 hr tablet Take 1 tablet (25 mg total) by mouth daily. Please keep upcoming appt in April with Dr. Tamala Julian before anymore refills. Thank you 90 tablet 0  . Multiple Vitamins-Minerals (MULTIVITAMIN ADULTS PO) Take 1 tablet by mouth daily.    . niacin (NIASPAN) 1000 MG CR tablet Take 1,000 mg by mouth 2 (two) times daily.    . potassium chloride (K-DUR) 10 MEQ tablet Take 10 mEq by mouth 2 (two) times daily with a meal.  3  . sertraline (ZOLOFT) 50 MG tablet Take 50 mg by mouth daily.    . valACYclovir (VALTREX) 500 MG tablet Take 500 mg by mouth daily as needed (out break).     . [DISCONTINUED] aspirin EC 81 MG tablet Take 81 mg by mouth daily.    Glory Rosebush VERIO test strip     . RELION INSULIN SYRINGE 1ML/31G 31G X 5/16" 1 ML MISC USE AS DIRECTED FIVE TIMES DAILY      Home: Home Living Family/patient expects to be discharged to:: Private residence Available Help at Discharge: Family Type of Home: House Additional Comments: moving into an ALF at the end of the month  Lives With: Spouse  Functional History: Prior Function Level of Independence: Independent with assistive device(s) Comments: with using RW Functional Status:  Mobility: Bed Mobility Overal bed mobility: Needs Assistance Bed Mobility: Supine to Sit Rolling: Min assist (used bed rail) Supine to sit: Min assist Sit to supine: Mod assist General bed mobility comments: assist up via R  elbow with stability while pt given time to get R UE in place to assist Transfers Overall transfer level: Needs assistance Equipment used: Rolling walker (2 wheeled) Transfers: Sit to/from Stand Sit to Stand: From elevated surface, Mod assist, +2 physical assistance General transfer comment: mod assis tto boost up to standing. Patient found to have stool on pad when stood, remained standing ~ 5 min to clean his bottom prior to walking. Ambulation/Gait Ambulation/Gait assistance: Min assist, Mod assist Gait Distance (Feet): 20 Feet Assistive device: Rolling walker (2 wheeled) Gait Pattern/deviations: Decreased step length - left, Step-to pattern, Shuffle, Trunk flexed General Gait Details: Patient requires assistanc eto keep RW at close distance, cues and min assist needed to take larger step with L LE. Became fatigued more easily this visit after standing to get cleaned up.  Became less safe, but did not voice being fatigued until asked several times. Required mod +2 assist to safely get into recliner which was following. Gait velocity: decr  Gait velocity interpretation: <1.31 ft/sec, indicative of household ambulator    ADL: ADL Overall ADL's : Needs assistance/impaired Eating/Feeding: Set up, Bed level Grooming: Minimal assistance, Bed level Upper Body Bathing: Minimal assistance, Sitting Lower Body Bathing: Maximal assistance, Sit to/from stand Lower Body Dressing: Maximal assistance, Sit to/from stand Lower Body Dressing Details (indicate cue type and reason): able to figure 4 cross L LE over R LE only. Pt unable to hold sock with BIL UE. L UE undershooting.  Toilet Transfer: Minimal assistance, Ambulation, RW Functional mobility during ADLs: Minimal assistance, Rolling walker General ADL Comments: pt with very narrowed base of support with turnign with RW. pt literally has bil LE touching. Pt with widen base of support for side stepping and needs mod cues to  correct  Cognition: Cognition Overall Cognitive Status: History of cognitive impairments - at baseline Orientation Level: Oriented X4 Memory: Impaired Memory Impairment: Storage deficit, Other (comment) (delayed recall of 3/5 object words) Safety/Judgment: Appears intact Cognition Arousal/Alertness: Awake/alert Behavior During Therapy: WFL for tasks assessed/performed Overall Cognitive Status: History of cognitive impairments - at baseline General Comments: pt oriented to location reason for admission introduced daughter jean appropriate.   Blood pressure (!) 166/74, pulse 64, temperature 99.2 F (37.3 C), temperature source Oral, resp. rate 18, height 5\' 10"  (1.778 m), weight 87.7 kg, SpO2 99 %. Physical Exam General: Somnolent, son at bedside HEENT: Head is normocephalic, atraumatic, PERRLA, EOMI, sclera anicteric, oral mucosa pink and moist, dentition intact, ext ear canals clear,  Neck: Supple without JVD or lymphadenopathy Heart: Reg rate and rhythm. No murmurs rubs or gallops Chest: CTA bilaterally without wheezes, rales, or rhonchi; no distress Abdomen: Soft, non-tender, non-distended, bowel sounds positive. Extremities: No clubbing, cyanosis, or edema. Pulses are 2+ Skin: Clean and intact without signs of breakdown Neuro: Patient is alert and oriented x3. In no acute distress. Left facial droop.  Makes eye contact with examiner follows commands.  Provides her name and age with some delay in processing. 5/5 strength with the exception of left sided hand grip 4/5. Psych: Pt's affect is appropriate. Pt is cooperative     Results for orders placed or performed during the hospital encounter of 08/19/20 (from the past 24 hour(s))  Glucose, capillary     Status: Abnormal   Collection Time: 08/23/20  4:46 PM  Result Value Ref Range   Glucose-Capillary 214 (H) 70 - 99 mg/dL   Comment 1 Notify RN    Comment 2 Document in Chart   Glucose, capillary     Status: Abnormal    Collection Time: 08/23/20  8:42 PM  Result Value Ref Range   Glucose-Capillary 255 (H) 70 - 99 mg/dL  Glucose, capillary     Status: Abnormal   Collection Time: 08/24/20  6:22 AM  Result Value Ref Range   Glucose-Capillary 107 (H) 70 - 99 mg/dL  Basic metabolic panel     Status: Abnormal   Collection Time: 08/24/20  8:55 AM  Result Value Ref Range   Sodium 142 135 - 145 mmol/L   Potassium 3.8 3.5 - 5.1 mmol/L   Chloride 110 98 - 111 mmol/L   CO2 24 22 - 32 mmol/L   Glucose, Bld 166 (H) 70 - 99 mg/dL   BUN 32 (H) 8 - 23 mg/dL   Creatinine, Ser 2.08 (H) 0.61 - 1.24 mg/dL   Calcium 9.6 8.9 - 10.3 mg/dL   GFR calc non Af Amer 28 (L) >60 mL/min   GFR calc Af Amer 33 (  L) >60 mL/min   Anion gap 8 5 - 15  SARS Coronavirus 2 by RT PCR (hospital order, performed in [facility] hospital lab) Nasopharyngeal Nasopharyngeal Swab     Status: None   Collection Time: 08/24/20 11:18 AM   Specimen: Nasopharyngeal Swab  Result Value Ref Range   SARS Coronavirus 2 NEGATIVE NEGATIVE  Glucose, capillary     Status: Abnormal   Collection Time: 08/24/20 11:36 AM  Result Value Ref Range   Glucose-Capillary 144 (H) 70 - 99 mg/dL   Comment 1 Notify RN    Comment 2 Document in Chart    No results found.   Assessment/Plan: Diagnosis: R thalamic infarct  1. Does the need for close, 24 hr/day medical supervision in concert with the patient's rehab needs make it unreasonable for this patient to be served in a less intensive setting? Yes 2. Co-Morbidities requiring supervision/potential complications: primitive trigeminal artery, CAD, essential HTN, HLD, DM, CKD stage IV, thrombocytopenia, hypothyroidism, dementia, depression, cellulitis 3. Due to bladder management, bowel management, safety, skin/wound care, disease management, medication administration, pain management and patient education, does the patient require 24 hr/day rehab nursing? Yes 4. Does the patient require coordinated care of a physician,  rehab nurse, therapy disciplines of PT, OT, SLP to address physical and functional deficits in the context of the above medical diagnosis(es)? Yes Addressing deficits in the following areas: balance, endurance, locomotion, strength, transferring, bowel/bladder control, bathing, dressing, feeding, grooming, toileting, cognition and psychosocial support 5. Can the patient actively participate in an intensive therapy program of at least 3 hrs of therapy per day at least 5 days per week? Yes 6. The potential for patient to make measurable gains while on inpatient rehab is good 7. Anticipated functional outcomes upon discharge from inpatient rehab are modified independent  with PT, modified independent with OT, modified independent with SLP. 8. Estimated rehab length of stay to reach the above functional goals is: 10-12 days 9. Anticipated discharge destination: Home 10. Overall Rehab/Functional Prognosis: excellent  RECOMMENDATIONS: This patient's condition is appropriate for continued rehabilitative care in the following setting: CIR Patient has agreed to participate in recommended program. Yes Note that insurance prior authorization may be required for reimbursement for recommended care.  Comment: Thank you for this consult. Admission coordinator to follow.   I have personally performed a face to face diagnostic evaluation, including, but not limited to relevant history and physical exam findings, of this patient and developed relevant assessment and plan.  Additionally, I have reviewed and concur with the physician assistant's documentation above.  Leeroy Cha, MD  Lavon Paganini McKenney, PA-C 08/24/2020

## 2020-08-24 NOTE — Progress Notes (Signed)
Inpatient Rehab Admissions Coordinator Note:   Per therapy recommendations, pt was screened for CIR candidacy by Shann Medal, PT, DPT.  At this time we are recommending a CIR consult and I will place an order per our protocol.  Please contact me with questions.   Shann Medal, PT, DPT [phone] 08/24/20 1:15 PM

## 2020-08-24 NOTE — Progress Notes (Signed)
Physical Therapy Treatment Patient Details Name: [name] MRN: [id number] DOB: [birth year] Today's Date: 08/24/2020    History of Present Illness Pt is an 86 y/o male admitted secondary to multiple falls and L sided weakness. Found to have R thalamic infarct. PMH inlucdes CAD s/p CABG, HTN, DM, CKD, and dementia.     PT Comments    Patient received sleeping. Easily roused. Agrees to PT session. He reports he slept well. Requires min assist for bed mobility supine to sit. Mod +2 assist for sit to stand from elevated bed. And mod assist for gait with RW 25 feet. Cues for step legnth and foot clearance needed on left as well as physical assist to take larger steps. Assist to keep RW close to him. Patient fatigued after this distance after requiring prolonged standing for cleaning prior to walk. He will continue to benefit from skilled PT while here to improve strength and functional independence.       Follow Up Recommendations  CIR     Equipment Recommendations  None recommended by PT;Other (comment) (to be determined)    Recommendations for Other Services Rehab consult     Precautions / Restrictions Precautions Precautions: Fall Precaution Comments: Reports at least 5-6 falls within the past few days.  Restrictions Weight Bearing Restrictions: No    Mobility  Bed Mobility Overal bed mobility: Needs Assistance Bed Mobility: Supine to Sit     Supine to sit: Min assist        Transfers Overall transfer level: Needs assistance Equipment used: Rolling walker (2 wheeled) Transfers: Sit to/from Stand Sit to Stand: From elevated surface;Mod assist;+2 physical assistance         General transfer comment: mod assis tto boost up to standing. Patient found to have stool on pad when stood, remained standing ~ 5 min to clean his bottom prior to walking.  Ambulation/Gait Ambulation/Gait assistance: Min assist;Mod assist Gait Distance (Feet): 20 Feet Assistive device: Rolling  walker (2 wheeled) Gait Pattern/deviations: Decreased step length - left;Step-to pattern;Shuffle;Trunk flexed Gait velocity: decr   General Gait Details: Patient requires assistanc eto keep RW at close distance, cues and min assist needed to take larger step with L LE. Became fatigued more easily this visit after standing to get cleaned up.  Became less safe, but did not voice being fatigued until asked several times. Required mod +2 assist to safely get into recliner which was following.   Stairs             Wheelchair Mobility    Modified Rankin (Stroke Patients Only) Modified Rankin (Stroke Patients Only) Pre-Morbid Rankin Score: No significant disability Modified Rankin: Moderately severe disability     Balance Overall balance assessment: Needs assistance Sitting-balance support: Feet supported Sitting balance-Leahy Scale: Good Sitting balance - Comments: able to sit up on bedside independently with supervision   Standing balance support: Bilateral upper extremity supported;During functional activity Standing balance-Leahy Scale: Poor Standing balance comment: UE assist on RW and reliant on external assist                            Cognition Arousal/Alertness: Awake/alert Behavior During Therapy: WFL for tasks assessed/performed Overall Cognitive Status: History of cognitive impairments - at baseline  Exercises Other Exercises Other Exercises: Seated LE exercises: LAQ, marching, hip abd/add, heel slides, AP x 10 reps each on left.    General Comments        Pertinent Vitals/Pain Pain Assessment: No/denies pain    Home Living                      Prior Function            PT Goals (current goals can now be found in the care plan section) Acute Rehab PT Goals Patient Stated Goal: per daughter want patient to be with wife PT Goal Formulation: With patient/family Time For Goal  Achievement: 09/04/20 Potential to Achieve Goals: Good Progress towards PT goals: Progressing toward goals    Frequency    Min 3X/week      PT Plan Discharge plan needs to be updated    Co-evaluation              AM-PAC PT "6 Clicks" Mobility   Outcome Measure  Help needed turning from your back to your side while in a flat bed without using bedrails?: A Little Help needed moving from lying on your back to sitting on the side of a flat bed without using bedrails?: A Little Help needed moving to and from a bed to a chair (including a wheelchair)?: A Little Help needed standing up from a chair using your arms (e.g., wheelchair or bedside chair)?: A Lot Help needed to walk in hospital room?: A Lot Help needed climbing 3-5 steps with a railing? : A Lot 6 Click Score: 15    End of Session Equipment Utilized During Treatment: Gait belt Activity Tolerance: Patient tolerated treatment well;Patient limited by fatigue Patient left: in chair;with chair alarm set Nurse Communication: Mobility status PT Visit Diagnosis: Unsteadiness on feet (R26.81);Muscle weakness (generalized) (M62.81);Difficulty in walking, not elsewhere classified (R26.2);History of falling (Z91.81);Repeated falls (R29.6);Hemiplegia and hemiparesis Hemiplegia - Right/Left: Left Hemiplegia - caused by: Cerebral infarction     Time: 9191-6606 PT Time Calculation (min) (ACUTE ONLY): 30 min  Charges:  $Gait Training: 8-22 mins $Therapeutic Exercise: 8-22 mins                     [employer], PT, GCS 08/24/20,12:42 PM

## 2020-08-24 NOTE — Progress Notes (Signed)
PROGRESS NOTE    [name]  FTD:[id number] DOB: [birth year] DOA: 08/19/2020 PCP: Hulan Fess, MD     Brief Narrative:  [name] is an 86 y.o. male with medical history significant of coronary artery disease status post CABG, hypertension, hyperlipidemia, type 2 diabetes mellitus, CKD stage IIIb, hypothyroidism, depression/anxiety, memory difficulty, chronic bilateral leg swelling, AAA status post repair, who presents to emergency department with recurrent falls and left-sided weakness started 2 days ago.  Rash on left arm which he noticed yesterday. Patient reports left-sided weakness which started on Thursday evening.  He typically ambulates with cane at baseline and fell 3 times on Thursday and 2 times on Friday.  Reports difficulty with ambulation due to his left leg giving out.  CT head shows small focus of ill-defined hypoattenuation within the right thalamus capsular junction which is new as compared to the prior head CT and likely reflects an acute/early subacute lacunar infarct.  Neurology was consulted and patient admitted for stroke work-up.  Subjective: Patient denies any complaints currently.  Denies any nausea vomiting.  Reports good appetite.  No shortness of breath.  Assessment & Plan:   Principal Problem:   Ischemic stroke [facility]) Active Problems:   Coronary artery disease   Essential hypertension   Hyperlipidemia   DM (diabetes mellitus) (HCC)   Chronic kidney disease, stage IV (severe) (HCC)   Thrombocytopenia (HCC)   Hypothyroidism   Dementia (HCC)   Depression   Cellulitis   Acute right thalamus and internal capsule CVA -CT head: Small focus of ill-defined hypoattenuation within the right thalamocapsular junction which is new as compared to the prior head CT of 03/18/2020 and likely reflects an acute/early subacute lacunar infarct given the provided history. -MRI brain: Acute/subacute 13 mm infarct involving the lateral right thalamus and internal capsule  with small focus of central petechial hemorrhage. -Echo: No intracardiac source of embolism detected on this transthoracic study -Carotid duplex US: 1-39% stenosis bilaterally   LDL of 96.  HbA1c 10.6. -Continue aspirin and Plavix for 3 weeks, followed by Plavix alone -Lipitor -Follow-up with neurology in 6 weeks -PT OT recommending SNF placement.  Patient remains stable from a neurological standpoint.  Left elbow cellulitis Seems to be improving.  Continue Keflex.  Essential hypertension Permissive hypertension was allowed due to acute stroke.  Metoprolol was resumed.  Blood pressure is stable.  Patient also on furosemide at home which is currently on hold.    Diabetes mellitus type 2, uncontrolled with hyperglycemia Hemoglobin A1c 10.6.  Hypoglycemic episodes noted yesterday.  Lantus dose was decreased.  Continue to monitor.    Hyperlipidemia Continue Lipitor.  LDL of 96.  CKD stage IV/hypokalemia Baseline creatinine 2.6.  Potassium was repleted.  Renal function is stable as of this morning.  Potassium is better.  Resume furosemide at discharge.  Hypothyroidism -Continue Synthroid  Coronary artery disease status post CABG Stable.  Continue beta-blocker and platelet agents and statin.  Depression, anxiety -Continue Zoloft, Cymbalta  Dementia -Continue Aricept   DVT prophylaxis: Lovenox Code Status: Full code Family Communication: No family at bedside Disposition Plan: SNF Status is: Inpatient  Remains inpatient appropriate because:Unsafe d/c plan   Dispo: The patient is from: Home              Anticipated d/c is to: SNF              Anticipated d/c date is: 1 day  Patient currently is medically stable to d/c.     Consultants:   Neurology  Procedures:   None  Antimicrobials:  Anti-infectives (From admission, onward)   Start     Dose/Rate Route Frequency Ordered Stop   08/21/20 1400  cephALEXin (KEFLEX) capsule 500 mg        500 mg Oral  Every 6 hours 08/21/20 1355 08/26/20 1159   08/20/20 1115  cefTRIAXone (ROCEPHIN) 1 g in sodium chloride 0.9 % 100 mL IVPB  Status:  Discontinued        1 g 200 mL/hr over 30 Minutes Intravenous Every 24 hours 08/20/20 1102 08/21/20 1355   08/20/20 1000  cefTRIAXone (ROCEPHIN) 1 g in sodium chloride 0.9 % 100 mL IVPB  Status:  Discontinued        1 g 200 mL/hr over 30 Minutes Intravenous Every 24 hours 08/20/20 0950 08/20/20 0951   08/20/20 0800  ceFAZolin (ANCEF) IVPB 1 g/50 mL premix        1 g 100 mL/hr over 30 Minutes Intravenous  Once 08/20/20 0757 08/20/20 1224       Objective: Vitals:   08/23/20 2328 08/24/20 0426 08/24/20 0900 08/24/20 1100  BP: (!) 187/89 (!) 176/78 (!) 157/66 (!) 166/74  Pulse: 62 63 (!) 59 64  Resp: 16 18    Temp: (!) 97.5 F (36.4 C) 97.7 F (36.5 C) 98.5 F (36.9 C) 99.2 F (37.3 C)  TempSrc: Oral Oral Oral Oral  SpO2: 97% 95% 97% 99%  Weight:      Height:        Intake/Output Summary (Last 24 hours) at 08/24/2020 1157 Last data filed at 08/24/2020 0900 Gross per 24 hour  Intake 320 ml  Output 350 ml  Net -30 ml   Filed Weights   08/21/20 1841  Weight: 87.7 kg    Examination:  General appearance: Awake alert.  In no distress Resp: Clear to auscultation bilaterally.  Normal effort Cardio: S1-S2 is normal regular.  No S3-S4.  No rubs murmurs or bruit GI: Abdomen is soft.  Nontender nondistended.  Bowel sounds are present normal.  No masses organomegaly Extremities: No edema.   Neurologic: Noted to be distracted.  Left-sided weakness noted.     Data Reviewed: I have personally reviewed following labs and imaging studies  CBC: Recent Labs  Lab 08/19/20 1419 08/20/20 0910 08/21/20 0414  WBC 8.5 7.6 6.5  NEUTROABS 6.9  --  4.5  HGB 15.4 15.0 14.4  HCT 47.5 46.8 45.1  MCV 88.8 91.4 90.6  PLT 140* 130* 335*   Basic Metabolic Panel: Recent Labs  Lab 08/19/20 1419 08/19/20 1419 08/20/20 0910 08/21/20 0414 08/22/20 0347  08/23/20 0215 08/24/20 0855  NA 136  --   --  141 140 141 142  K 3.8  --   --  3.7 3.8 3.3* 3.8  CL 103  --   --  110 110 108 110  CO2 24  --   --  21* 21* 25 24  GLUCOSE 471*  --   --  191* 134* 108* 166*  BUN 38*  --   --  35* 29* 34* 32*  CREATININE 2.54*   < > 2.45* 2.34* 2.05* 2.35* 2.08*  CALCIUM 9.5  --   --  9.3 9.0 9.1 9.6   < > = values in this interval not displayed.   GFR: Estimated Creatinine Clearance: 29.5 mL/min (A) (by C-G formula based on SCr of 2.08 mg/dL (H)). Liver Function Tests:  Recent Labs  Lab 08/19/20 1419  AST 18  ALT 19  ALKPHOS 76  BILITOT 0.8  PROT 5.8*  ALBUMIN 3.3*   Coagulation Profile: Recent Labs  Lab 08/19/20 1419  INR 1.1   CBG: Recent Labs  Lab 08/23/20 1235 08/23/20 1646 08/23/20 2042 08/24/20 0622 08/24/20 1136  GLUCAP 235* 214* 255* 107* 144*    Recent Results (from the past 240 hour(s))  SARS Coronavirus 2 by RT PCR (hospital order, performed in [facility] hospital lab) Nasopharyngeal Nasopharyngeal Swab     Status: None   Collection Time: 08/20/20 12:21 PM   Specimen: Nasopharyngeal Swab  Result Value Ref Range Status   SARS Coronavirus 2 NEGATIVE NEGATIVE Final    Comment: (NOTE) SARS-CoV-2 target nucleic acids are NOT DETECTED.  The SARS-CoV-2 RNA is generally detectable in upper and lower respiratory specimens during the acute phase of infection. The lowest concentration of SARS-CoV-2 viral copies this assay can detect is 250 copies / mL. A negative result does not preclude SARS-CoV-2 infection and should not be used as the sole basis for treatment or other patient management decisions.  A negative result may occur with improper specimen collection / handling, submission of specimen other than nasopharyngeal swab, presence of viral mutation(s) within the areas targeted by this assay, and inadequate number of viral copies (<250 copies / mL). A negative result must be combined with clinical observations,  patient history, and epidemiological information.  Fact Sheet for Patients:   StrictlyIdeas.no  Fact Sheet for Healthcare Providers: BankingDealers.co.za  This test is not yet approved or  cleared by the [place] FDA and has been authorized for detection and/or diagnosis of SARS-CoV-2 by FDA under an Emergency Use Authorization (EUA).  This EUA will remain in effect (meaning this test can be used) for the duration of the COVID-19 declaration under Section 564(b)(1) of the Act, 21 U.S.C. section 360bbb-3(b)(1), unless the authorization is terminated or revoked sooner.  Performed at [facility], [place] [address], [place], [place] [address]       Radiology Studies: No results found.    Scheduled Meds: . aspirin EC  81 mg Oral Daily  . atorvastatin  40 mg Oral QPM  . cephALEXin  500 mg Oral Q6H  . clopidogrel  75 mg Oral Daily  . donepezil  10 mg Oral QHS  . DULoxetine  30 mg Oral QHS  . enoxaparin (LOVENOX) injection  30 mg Subcutaneous Daily  . insulin aspart  0-5 Units Subcutaneous QHS  . insulin glargine  25 Units Subcutaneous Daily  . levothyroxine  75 mcg Oral QAC breakfast  . metoprolol succinate  25 mg Oral Daily  . saccharomyces boulardii  250 mg Oral BID  . sertraline  50 mg Oral Daily  . sodium chloride flush  3 mL Intravenous Once   Continuous Infusions:    LOS: 4 days      Bonnielee Haff, [facility] 08/24/2020, 11:57 AM

## 2020-08-24 NOTE — Progress Notes (Signed)
Please be advised that the above-named patient will require a short-term nursing home stay-anticipated 30 days or less for rehabilitation and strengthening. The plan is for return home.  

## 2020-08-24 NOTE — TOC Progression Note (Addendum)
Transition of Care [facility]) - Progression Note    Patient Details  Name: [name] MRN: [id number] Date of Birth: 12-05-1934  Transition of Care [facility]) CM/SW [place], [place] Phone Number: 08/24/2020, 11:25 AM  Clinical Narrative:   CSW worked throughout the day yesterday with patient's son, Khayden, to determine choice of SNF at [facility]. CSW confirmed bed availability at [facility] and planned admission today. CSW then received a call from patient's daughter, Romie Minus, who was very angry that the family was not notified previously that the patient would have to quarantine since he hadn't been fully vaccinated. Per Romie Minus, the family is now refusing transfer to SNF because of the quarantine and demanding a bed in CIR. CSW told Romie Minus that CIR had no beds available, and Romie Minus told CSW to "fix it". CSW updated MD, requested CIR consult.  UPDATE: CSW received another call from Romie Minus asking for an update. CSW updated Romie Minus that MD had placed a CIR consult, waiting on decision, but again reminded her that there were no beds so he would not be able to go today and unsure if/when. Romie Minus continued to yell at Amelia about this being her fault, began cussing and threatening her. CSW asked her not to cuss, and daughter eventually hung up the phone. CSW notified Unit Director of the situation. CIR consult pending.    Expected Discharge Plan: [place] Barriers to Discharge: Continued Medical Work up, SNF Pending bed offer  Expected Discharge Plan and Services Expected Discharge Plan: [place] In-house Referral: Clinical Social Work   Post Acute Care Choice: [place] Living arrangements for the past 2 months: Single Family Home                                       Social Determinants of Health (SDOH) Interventions    Readmission Risk Interventions No flowsheet data found.

## 2020-08-25 LAB — GLUCOSE, CAPILLARY
Glucose-Capillary: 147 mg/dL — ABNORMAL HIGH (ref 70–99)
Glucose-Capillary: 166 mg/dL — ABNORMAL HIGH (ref 70–99)
Glucose-Capillary: 251 mg/dL — ABNORMAL HIGH (ref 70–99)
Glucose-Capillary: 157 mg/dL — ABNORMAL HIGH (ref 70–99)

## 2020-08-25 MED ORDER — INSULIN ASPART 100 UNIT/ML ~~LOC~~ SOLN
0.0000 [IU] | Freq: Three times a day (TID) | SUBCUTANEOUS | Status: DC
  Administered 2020-08-26: 3 [IU] via SUBCUTANEOUS
  Administered 2020-08-26: 2 [IU] via SUBCUTANEOUS
  Administered 2020-08-26: 8 [IU] via SUBCUTANEOUS
  Administered 2020-08-27: 5 [IU] via SUBCUTANEOUS
  Administered 2020-08-27: 3 [IU] via SUBCUTANEOUS
  Administered 2020-08-27: 2 [IU] via SUBCUTANEOUS
  Administered 2020-08-28 (×2): 3 [IU] via SUBCUTANEOUS
  Administered 2020-08-28 – 2020-08-29 (×2): 2 [IU] via SUBCUTANEOUS
  Administered 2020-08-29 – 2020-08-30 (×2): 3 [IU] via SUBCUTANEOUS

## 2020-08-25 MED ORDER — FUROSEMIDE 40 MG PO TABS
40.0000 mg | ORAL_TABLET | Freq: Two times a day (BID) | ORAL | Status: DC
  Administered 2020-08-25 – 2020-08-27 (×6): 40 mg via ORAL
  Filled 2020-08-25 (×5): qty 1

## 2020-08-25 MED ORDER — INSULIN ASPART 100 UNIT/ML ~~LOC~~ SOLN: 0.0000 [IU] | Freq: Every day | SUBCUTANEOUS | Status: DC

## 2020-08-25 MED ORDER — CALCITRIOL 0.25 MCG PO CAPS
0.2500 ug | ORAL_CAPSULE | Freq: Every day | ORAL | Status: DC
  Administered 2020-08-25 – 2020-08-30 (×6): 0.25 ug via ORAL
  Filled 2020-08-25 (×6): qty 1

## 2020-08-25 NOTE — Progress Notes (Signed)
Inpatient Diabetes Program Recommendations  AACE/ADA: New Consensus Statement on Inpatient Glycemic Control (2015)  Target Ranges:  Prepandial:   less than 140 mg/dL      Peak postprandial:   less than 180 mg/dL (1-2 hours)      Critically ill patients:  140 - 180 mg/dL   Lab Results  Component Value Date   GLUCAP 166 (H) 08/25/2020   HGBA1C 10.6 (H) 08/21/2020    Review of Glycemic Control Results for [name], [name] (MRN [id number]) as of 08/25/2020 09:55  Ref. Range 08/24/2020 16:02 08/24/2020 21:14 08/25/2020 06:26 08/25/2020 08:32  Glucose-Capillary Latest Ref Range: 70 - 99 mg/dL 203 (H) 293 (H) 147 (H) 166 (H)    Diabetes history:DM2 Outpatient Diabetes medications:Lantus 25-30 units BID, Novolog 0-15 units TID with meals Current orders for Inpatient glycemic control:Lantus 25 unitsdaily,Novolog 0-5 units QHS  Inpatient Diabetes Program Recommendations:    Consider ordering Novolog 0-9 units TID with meals for correction.  Thanks, Bronson Curb, MSN, RNC-OB Diabetes Coordinator [phone] (8a-5p)

## 2020-08-25 NOTE — Progress Notes (Signed)
Inpatient Rehab Admissions Coordinator:   Met with patient at bedside to discuss potential CIR admission. Pt. Stated interest. Will pursue for potential admit next week, pending bed availability.  Jaycee Pelzer, MS, CCC-SLP Rehab Admissions Coordinator  [phone] (celll) [phone] (office) 

## 2020-08-25 NOTE — Progress Notes (Signed)
Occupational Therapy Treatment Patient Details Name: [name] MRN: [id number] DOB: [birth year] Today's Date: 08/25/2020    History of present illness Pt is an 86 y/o male admitted secondary to multiple falls and L sided weakness. Found to have R thalamic infarct. PMH inlucdes CAD s/p CABG, HTN, DM, CKD, and dementia.    OT comments  Pt. Seen for skilled OT treatment session.  Eager for participation.  Bed mobility with min a,  sit/stand and pivot to L side with mod/max a.  Able to return demo and complete LUE exercises/rom.  Reviewed at length and examples provided for the importance of integrating use of LUE in all adls.  Dtr. Present and active in session.  Remains excellent CIR candidate for continued therapies with focus on strengthening and safety with adls and mobility.      Follow Up Recommendations  CIR    Equipment Recommendations  3 in 1 bedside commode;Other (comment)    Recommendations for Other Services Rehab consult    Precautions / Restrictions Precautions Precautions: Fall       Mobility Bed Mobility Overal bed mobility: Needs Assistance Bed Mobility: Supine to Sit     Supine to sit: Min assist        Transfers Overall transfer level: Needs assistance Equipment used: Rolling walker (2 wheeled) Transfers: Sit to/from [facility] Sit to Stand: Max assist Stand pivot transfers: Mod assist       General transfer comment: heavy assist to transtion into standing.  once up, mod/max a for initiating transfer to recliner. max cues for hand placement unable to carry over.  assisted some with scooting hips back in recliner.  Notable R lean.  Dtr. Reports he always has that lean when seated.  Positioned with pillows behind back and under R arm to promote a centered neutral sitting position.  Pt. Reports he was comfortable with the pillows.      Balance                                           ADL either performed or assessed  with clinical judgement   ADL Overall ADL's : Needs assistance/impaired     Grooming: Wash/dry face;Set up;Minimal assistance;Sitting Grooming Details (indicate cue type and reason): max encouragement for use of L hand during tasks                 Toilet Transfer: Moderate assistance;Stand-pivot;RW Toilet Transfer Details (indicate cue type and reason): simulated eob to recliner         Functional mobility during ADLs: Moderate assistance;Rolling walker General ADL Comments: pt. with max cues and encouragement to incorporate L hand into adls.  pt. is R hand dominant so making this more difficult. dtr. present for session and reports that he is "very" R hand dominnat and she cant recall him using his L hand when he didnt have to.  provided examples of ways to use L hand during tasks ie: turning pages, unscrewing and squeezing t.paste, scanning for word searches, opening packages).     Vision       Perception     Praxis      Cognition Arousal/Alertness: Awake/alert Behavior During Therapy: WFL for tasks assessed/performed Overall Cognitive Status: History of cognitive impairments - at baseline  Exercises     Shoulder Instructions       General Comments  pt. From [place] originally.  Has 2 sons and a dtr.  Likes to do word searches and rake leaves.  Great sense of humor.      Pertinent Vitals/ Pain       Pain Assessment: Faces Faces Pain Scale: Hurts a little bit Pain Location: describes "sort of" L knee pain but more of a disconnected sensation from his body. can feel it and also not feel it per his report  Home Living                                          Prior Functioning/Environment              Frequency  Min 3X/week        Progress Toward Goals  OT Goals(current goals can now be found in the care plan section)  Progress towards OT goals: Progressing toward  goals     Plan      Co-evaluation                 AM-PAC OT "6 Clicks" Daily Activity     Outcome Measure   Help from another person eating meals?: A Little Help from another person taking care of personal grooming?: A Little Help from another person toileting, which includes using toliet, bedpan, or urinal?: A Lot Help from another person bathing (including washing, rinsing, drying)?: A Lot Help from another person to put on and taking off regular upper body clothing?: A Little Help from another person to put on and taking off regular lower body clothing?: A Lot 6 Click Score: 15    End of Session Equipment Utilized During Treatment: Gait belt;Rolling walker  OT Visit Diagnosis: Unsteadiness on feet (R26.81);Muscle weakness (generalized) (M62.81)   Activity Tolerance Patient tolerated treatment well   Patient Left in chair;with call bell/phone within reach;with family/visitor present   Nurse Communication          Time: 8032-1224 OT Time Calculation (min): 24 min  Charges: OT General Charges $OT Visit: 1 Visit OT Treatments $Self Care/Home Management : 23-37 mins  Sonia Baller, COTA/L Acute Rehabilitation [phone]   Janice Coffin 08/25/2020, 1:02 PM

## 2020-08-25 NOTE — Progress Notes (Addendum)
PROGRESS NOTE    [name]  ZWC:[id number] DOB: 10/10/1934 DOA: 08/19/2020 PCP: Hulan Fess, MD     Brief Narrative:  [name] is an 86 y.o. male with medical history significant of coronary artery disease status post CABG, hypertension, hyperlipidemia, type 2 diabetes mellitus, CKD stage IIIb, hypothyroidism, depression/anxiety, memory difficulty, chronic bilateral leg swelling, AAA status post repair, who presents to emergency department with recurrent falls and left-sided weakness started 2 days ago.  Rash on left arm which he noticed yesterday. Patient reports left-sided weakness which started on Thursday evening.  He typically ambulates with cane at baseline and fell 3 times on Thursday and 2 times on Friday.  Reports difficulty with ambulation due to his left leg giving out.  CT head shows small focus of ill-defined hypoattenuation within the right thalamus capsular junction which is new as compared to the prior head CT and likely reflects an acute/early subacute lacunar infarct.  Neurology was consulted and patient admitted for stroke work-up.  Subjective: Nursing staff raised concern for new symptoms per patient.  Experiencing some tingling in the left side.  Patient was evaluated.  This is the same side as the stroke deficit.  No new deficits noted today compared to yesterday.  Patient denies any other complaints at this time.  Assessment & Plan:   Principal Problem:   Ischemic stroke [facility]) Active Problems:   Coronary artery disease   Essential hypertension   Hyperlipidemia   DM (diabetes mellitus) (HCC)   Chronic kidney disease, stage IV (severe) (HCC)   Thrombocytopenia (HCC)   Hypothyroidism   Dementia (HCC)   Depression   Cellulitis   Acute right thalamus and internal capsule CVA -CT head: Small focus of ill-defined hypoattenuation within the right thalamocapsular junction which is new as compared to the prior head CT of 03/18/2020 and likely reflects an  acute/early subacute lacunar infarct given the provided history. -MRI brain: Acute/subacute 13 mm infarct involving the lateral right thalamus and internal capsule with small focus of central petechial hemorrhage. -Echo: No intracardiac source of embolism detected on this transthoracic study -Carotid duplex US: 1-39% stenosis bilaterally   LDL of 96.  HbA1c 10.6. -Continue aspirin and Plavix for 3 weeks, followed by Plavix alone -Lipitor -Follow-up with neurology in 6 weeks Patient with some tingling on the left side.  Examined.  No strength deficits noted today compared to yesterday.  Seems to be actually a bit stronger on exam. Initially skilled nursing facility was recommended.  However family wanted to pursue inpatient rehabilitation. Patient remains stable from a respiratory standpoint.  Left elbow cellulitis Seems to be improving.  He will complete a 5-day course of Keflex.  Essential hypertension Permissive hypertension was allowed due to acute stroke.  Metoprolol was resumed.  Blood pressure remains elevated.  Heart rate noted to be in the 50s at times.  Will not increase the dose of his metoprolol.  We will resume his furosemide.    Diabetes mellitus type 2, uncontrolled with hyperglycemia Hemoglobin A1c 10.6.  Hypoglycemic episodes noted on 9/15.  Lantus dose was decreased.  CBGs are stable.  Will not adjust Lantus dose today.  Hyperlipidemia Continue Lipitor.  LDL of 96.  CKD stage IV/hypokalemia Baseline creatinine 2.6.  Renal function close to baseline.  Continue to monitor labs periodically.  Resume furosemide today at a lower dose.    Hypothyroidism Continue Synthroid  Coronary artery disease status post CABG Stable.  Continue beta-blocker and platelet agents and statin.  Depression, anxiety  Continue Zoloft, Cymbalta  Dementia Continue Aricept   DVT prophylaxis: Lovenox Code Status: Full code Family Communication: No family at bedside Disposition Plan: CIR   Status is: Inpatient  Remains inpatient appropriate because:Unsafe d/c plan   Dispo: The patient is from: Home              Anticipated d/c is to: CIR              Anticipated d/c date is: Depending on insurance authorization and bed availability              Patient currently is medically stable to d/c.     Consultants:   Neurology  Procedures:   None  Antimicrobials:  Anti-infectives (From admission, onward)   Start     Dose/Rate Route Frequency Ordered Stop   08/21/20 1400  cephALEXin (KEFLEX) capsule 500 mg        500 mg Oral Every 6 hours 08/21/20 1355 08/26/20 1159   08/20/20 1115  cefTRIAXone (ROCEPHIN) 1 g in sodium chloride 0.9 % 100 mL IVPB  Status:  Discontinued        1 g 200 mL/hr over 30 Minutes Intravenous Every 24 hours 08/20/20 1102 08/21/20 1355   08/20/20 1000  cefTRIAXone (ROCEPHIN) 1 g in sodium chloride 0.9 % 100 mL IVPB  Status:  Discontinued        1 g 200 mL/hr over 30 Minutes Intravenous Every 24 hours 08/20/20 0950 08/20/20 0951   08/20/20 0800  ceFAZolin (ANCEF) IVPB 1 g/50 mL premix        1 g 100 mL/hr over 30 Minutes Intravenous  Once 08/20/20 0757 08/20/20 1224       Objective: Vitals:   08/24/20 1953 08/24/20 2312 08/25/20 0317 08/25/20 0759  BP: (!) 189/76 (!) 172/76 (!) 192/82 (!) 186/81  Pulse: 65 65 62 76  Resp: 19 17 16    Temp: 98 F (36.7 C) 97.7 F (36.5 C) 97.9 F (36.6 C) 97.7 F (36.5 C)  TempSrc: Oral Oral Oral Oral  SpO2: 100% 100% 97% 94%  Weight:      Height:        Intake/Output Summary (Last 24 hours) at 08/25/2020 0944 Last data filed at 08/25/2020 0500 Gross per 24 hour  Intake 150 ml  Output 250 ml  Net -100 ml   Filed Weights   08/21/20 1841  Weight: 87.7 kg    Examination:  General appearance: Awake alert.  In no distress Resp: Clear to auscultation bilaterally.  Normal effort Cardio: S1-S2 is normal regular.  No S3-S4.  No rubs murmurs or bruit GI: Abdomen is soft.  Nontender nondistended.   Bowel sounds are present normal.  No masses organomegaly Extremities: No edema.   Neurologic: Awake alert.  Distracted.  No facial asymmetry.  4 out of 5 strength noted in the left upper and lower extremities.      Data Reviewed: I have personally reviewed following labs and imaging studies  CBC: Recent Labs  Lab 08/19/20 1419 08/20/20 0910 08/21/20 0414  WBC 8.5 7.6 6.5  NEUTROABS 6.9  --  4.5  HGB 15.4 15.0 14.4  HCT 47.5 46.8 45.1  MCV 88.8 91.4 90.6  PLT 140* 130* 174*   Basic Metabolic Panel: Recent Labs  Lab 08/19/20 1419 08/19/20 1419 08/20/20 0910 08/21/20 0414 08/22/20 0347 08/23/20 0215 08/24/20 0855  NA 136  --   --  141 140 141 142  K 3.8  --   --  3.7 3.8  3.3* 3.8  CL 103  --   --  110 110 108 110  CO2 24  --   --  21* 21* 25 24  GLUCOSE 471*  --   --  191* 134* 108* 166*  BUN 38*  --   --  35* 29* 34* 32*  CREATININE 2.54*   < > 2.45* 2.34* 2.05* 2.35* 2.08*  CALCIUM 9.5  --   --  9.3 9.0 9.1 9.6   < > = values in this interval not displayed.   GFR: Estimated Creatinine Clearance: 29.5 mL/min (A) (by C-G formula based on SCr of 2.08 mg/dL (H)). Liver Function Tests: Recent Labs  Lab 08/19/20 1419  AST 18  ALT 19  ALKPHOS 76  BILITOT 0.8  PROT 5.8*  ALBUMIN 3.3*   Coagulation Profile: Recent Labs  Lab 08/19/20 1419  INR 1.1   CBG: Recent Labs  Lab 08/24/20 1136 08/24/20 1602 08/24/20 2114 08/25/20 0626 08/25/20 0832  GLUCAP 144* 203* 293* 147* 166*    Recent Results (from the past 240 hour(s))  SARS Coronavirus 2 by RT PCR (hospital order, performed in [facility] hospital lab) Nasopharyngeal Nasopharyngeal Swab     Status: None   Collection Time: 08/20/20 12:21 PM   Specimen: Nasopharyngeal Swab  Result Value Ref Range Status   SARS Coronavirus 2 NEGATIVE NEGATIVE Final    Comment: (NOTE) SARS-CoV-2 target nucleic acids are NOT DETECTED.  The SARS-CoV-2 RNA is generally detectable in upper and lower respiratory specimens  during the acute phase of infection. The lowest concentration of SARS-CoV-2 viral copies this assay can detect is 250 copies / mL. A negative result does not preclude SARS-CoV-2 infection and should not be used as the sole basis for treatment or other patient management decisions.  A negative result may occur with improper specimen collection / handling, submission of specimen other than nasopharyngeal swab, presence of viral mutation(s) within the areas targeted by this assay, and inadequate number of viral copies (<250 copies / mL). A negative result must be combined with clinical observations, patient history, and epidemiological information.  Fact Sheet for Patients:   StrictlyIdeas.no  Fact Sheet for Healthcare Providers: BankingDealers.co.za  This test is not yet approved or  cleared by the [place] FDA and has been authorized for detection and/or diagnosis of SARS-CoV-2 by FDA under an Emergency Use Authorization (EUA).  This EUA will remain in effect (meaning this test can be used) for the duration of the COVID-19 declaration under Section 564(b)(1) of the Act, 21 U.S.C. section 360bbb-3(b)(1), unless the authorization is terminated or revoked sooner.  Performed at [facility], [place] [address], [place], [place] [address]   SARS Coronavirus 2 by RT PCR (hospital order, performed in [facility] hospital lab) Nasopharyngeal Nasopharyngeal Swab     Status: None   Collection Time: 08/24/20 11:18 AM   Specimen: Nasopharyngeal Swab  Result Value Ref Range Status   SARS Coronavirus 2 NEGATIVE NEGATIVE Final    Comment: (NOTE) SARS-CoV-2 target nucleic acids are NOT DETECTED.  The SARS-CoV-2 RNA is generally detectable in upper and lower respiratory specimens during the acute phase of infection. The lowest concentration of SARS-CoV-2 viral copies this assay can detect is 250 copies / mL. A negative result does not preclude  SARS-CoV-2 infection and should not be used as the sole basis for treatment or other patient management decisions.  A negative result may occur with improper specimen collection / handling, submission of specimen other than nasopharyngeal  swab, presence of viral mutation(s) within the areas targeted by this assay, and inadequate number of viral copies (<250 copies / mL). A negative result must be combined with clinical observations, patient history, and epidemiological information.  Fact Sheet for Patients:   StrictlyIdeas.no  Fact Sheet for Healthcare Providers: BankingDealers.co.za  This test is not yet approved or  cleared by the [place] FDA and has been authorized for detection and/or diagnosis of SARS-CoV-2 by FDA under an Emergency Use Authorization (EUA).  This EUA will remain in effect (meaning this test can be used) for the duration of the COVID-19 declaration under Section 564(b)(1) of the Act, 21 U.S.C. section 360bbb-3(b)(1), unless the authorization is terminated or revoked sooner.  Performed at [facility], [place] [address], [place], [place] [address]       Radiology Studies: No results found.    Scheduled Meds: . aspirin EC  81 mg Oral Daily  . atorvastatin  40 mg Oral QPM  . cephALEXin  500 mg Oral Q6H  . clopidogrel  75 mg Oral Daily  . donepezil  10 mg Oral QHS  . DULoxetine  30 mg Oral QHS  . enoxaparin (LOVENOX) injection  30 mg Subcutaneous Daily  . insulin aspart  0-5 Units Subcutaneous QHS  . insulin glargine  25 Units Subcutaneous Daily  . levothyroxine  75 mcg Oral QAC breakfast  . metoprolol succinate  25 mg Oral Daily  . saccharomyces boulardii  250 mg Oral BID  . sertraline  50 mg Oral Daily  . sodium chloride flush  3 mL Intravenous Once   Continuous Infusions:    LOS: 5 days      Bonnielee Haff, [facility] 08/25/2020, 9:44 AM

## 2020-08-25 NOTE — Care Management Important Message (Signed)
Important Message  Patient Details  Name: [name] MRN: [id number] Date of Birth: [birth year]   Medicare Important Message Given:  Yes - Important Message mailed due to current National Emergency  Verbal consent obtained due to current National Emergency  Relationship to patient: Child Contact Name: Glenda Chroman Call Date: 08/25/20  Time: 1125 Phone: [phone] Outcome: Spoke with contact Important Message mailed to: Patient address on file    Delorse Lek 08/25/2020, 11:25 AM

## 2020-08-26 LAB — GLUCOSE, CAPILLARY
Glucose-Capillary: 146 mg/dL — ABNORMAL HIGH (ref 70–99)
Glucose-Capillary: 164 mg/dL — ABNORMAL HIGH (ref 70–99)
Glucose-Capillary: 273 mg/dL — ABNORMAL HIGH (ref 70–99)
Glucose-Capillary: 133 mg/dL — ABNORMAL HIGH (ref 70–99)

## 2020-08-26 LAB — BASIC METABOLIC PANEL
Anion gap: 9 (ref 5–15)
BUN: 35 mg/dL — ABNORMAL HIGH (ref 8–23)
CO2: 24 mmol/L (ref 22–32)
Calcium: 9.6 mg/dL (ref 8.9–10.3)
Chloride: 107 mmol/L (ref 98–111)
Creatinine, Ser: 2.29 mg/dL — ABNORMAL HIGH (ref 0.61–1.24)
GFR calc Af Amer: 29 mL/min — ABNORMAL LOW (ref >60)
GFR calc non Af Amer: 25 mL/min — ABNORMAL LOW (ref >60)
Glucose, Bld: 131 mg/dL — ABNORMAL HIGH (ref 70–99)
Potassium: 3.8 mmol/L (ref 3.5–5.1)
Sodium: 140 mmol/L (ref 135–145)

## 2020-08-26 MED ORDER — LOPERAMIDE HCL 2 MG PO CAPS: 2.0000 mg | ORAL_CAPSULE | Freq: Two times a day (BID) | ORAL | Status: DC | PRN

## 2020-08-26 NOTE — Progress Notes (Signed)
PROGRESS NOTE    [name]  HUD:[id number] DOB: 08/09/1934 DOA: 08/19/2020 PCP: Hulan Fess, MD     Brief Narrative:  [name] is an 86 y.o. male with medical history significant of coronary artery disease status post CABG, hypertension, hyperlipidemia, type 2 diabetes mellitus, CKD stage IIIb, hypothyroidism, depression/anxiety, memory difficulty, chronic bilateral leg swelling, AAA status post repair, who presents to emergency department with recurrent falls and left-sided weakness started 2 days ago.  Rash on left arm which he noticed yesterday. Patient reports left-sided weakness which started on Thursday evening.  He typically ambulates with cane at baseline and fell 3 times on Thursday and 2 times on Friday.  Reports difficulty with ambulation due to his left leg giving out.  CT head shows small focus of ill-defined hypoattenuation within the right thalamus capsular junction which is new as compared to the prior head CT and likely reflects an acute/early subacute lacunar infarct.  Neurology was consulted and patient admitted for stroke work-up.  Subjective: Patient denies any complaints.  No new overnight issues.    Assessment & Plan:   Principal Problem:   Ischemic stroke [facility]) Active Problems:   Coronary artery disease   Essential hypertension   Hyperlipidemia   DM (diabetes mellitus) (HCC)   Chronic kidney disease, stage IV (severe) (HCC)   Thrombocytopenia (HCC)   Hypothyroidism   Dementia (HCC)   Depression   Cellulitis   Acute right thalamus and internal capsule CVA -CT head: Small focus of ill-defined hypoattenuation within the right thalamocapsular junction which is new as compared to the prior head CT of 03/18/2020 and likely reflects an acute/early subacute lacunar infarct given the provided history. -MRI brain: Acute/subacute 13 mm infarct involving the lateral right thalamus and internal capsule with small focus of central petechial hemorrhage. -Echo: No  intracardiac source of embolism detected on this transthoracic study -Carotid duplex US: 1-39% stenosis bilaterally   LDL of 96.  HbA1c 10.6. -Continue aspirin and Plavix for 3 weeks, followed by Plavix alone -Lipitor -Follow-up with neurology in 6 weeks No new symptoms per patient.  Weakness on the left side is stable with perhaps slight improvement.   PT and OT continue to follow.  Initially skilled nursing facility was recommended. However family wanted to pursue inpatient rehabilitation. Patient remains stable from a neurological standpoint.  Left elbow cellulitis Seems to be improving.  He will complete a 5-day course of Keflex.  Has been experiencing some loose stools.  Started on [employer].  Imodium as needed.  Abdomen is benign.  Essential hypertension Permissive hypertension was allowed due to acute stroke.  Metoprolol was resumed.  Blood pressure remains elevated.  Heart rate noted to be in the 50s at times.  Will not increase the dose of his metoprolol.  Furosemide was resumed yesterday.  Monitor blood pressures.  Diabetes mellitus type 2, uncontrolled with hyperglycemia Hemoglobin A1c 10.6.  Hypoglycemic episodes noted on 9/15.  Lantus dose was decreased.  CBGs are stable for the most part.  Continue current dose of Lantus.   Hyperlipidemia Continue Lipitor.  LDL 96.  CKD stage IV/hypokalemia Baseline creatinine 2.6.  Renal function close to baseline.  Monitor urine output.  Furosemide resumed yesterday.    Hypothyroidism Continue Synthroid  Coronary artery disease status post CABG Stable.  Continue beta-blocker and platelet agents and statin.  Depression, anxiety Continue Zoloft, Cymbalta  Dementia Continue Aricept   DVT prophylaxis: Lovenox Code Status: Full code Family Communication: No family at bedside Disposition Plan: CIR  Status is: Inpatient  Remains inpatient appropriate because:Unsafe d/c plan   Dispo: The patient is from: Home               Anticipated d/c is to: CIR              Anticipated d/c date is: Depending on insurance authorization and bed availability              Patient currently is medically stable to d/c.     Consultants:   Neurology  Procedures:   None  Antimicrobials:  Anti-infectives (From admission, onward)   Start     Dose/Rate Route Frequency Ordered Stop   08/21/20 1400  cephALEXin (KEFLEX) capsule 500 mg        500 mg Oral Every 6 hours 08/21/20 1355 08/26/20 1159   08/20/20 1115  cefTRIAXone (ROCEPHIN) 1 g in sodium chloride 0.9 % 100 mL IVPB  Status:  Discontinued        1 g 200 mL/hr over 30 Minutes Intravenous Every 24 hours 08/20/20 1102 08/21/20 1355   08/20/20 1000  cefTRIAXone (ROCEPHIN) 1 g in sodium chloride 0.9 % 100 mL IVPB  Status:  Discontinued        1 g 200 mL/hr over 30 Minutes Intravenous Every 24 hours 08/20/20 0950 08/20/20 0951   08/20/20 0800  ceFAZolin (ANCEF) IVPB 1 g/50 mL premix        1 g 100 mL/hr over 30 Minutes Intravenous  Once 08/20/20 0757 08/20/20 1224       Objective: Vitals:   08/25/20 1949 08/25/20 2336 08/26/20 0404 08/26/20 0744  BP: (!) 162/84 (!) 163/85 (!) 160/71 (!) 182/74  Pulse: 61 (!) 58 (!) 56 71  Resp: 18 17 18 20   Temp: 98.4 F (36.9 C) 98.2 F (36.8 C) 97.7 F (36.5 C) 98.7 F (37.1 C)  TempSrc: Oral Oral Oral Oral  SpO2: 100% 98% 99% 99%  Weight:      Height:        Intake/Output Summary (Last 24 hours) at 08/26/2020 1016 Last data filed at 08/26/2020 0500 Gross per 24 hour  Intake 370 ml  Output --  Net 370 ml   Filed Weights   08/21/20 1841  Weight: 87.7 kg    Examination:  General appearance: Awake alert.  In no distress Resp: Clear to auscultation bilaterally.  Normal effort Cardio: S1-S2 is normal regular.  No S3-S4.  No rubs murmurs or bruit GI: Abdomen is soft.  Nontender nondistended.  Bowel sounds are present normal.  No masses organomegaly Extremities: No edema.  Full range of motion of lower  extremities. Neurologic: Left-sided weakness as before.  No changes.     Data Reviewed: I have personally reviewed following labs and imaging studies  CBC: Recent Labs  Lab 08/19/20 1419 08/20/20 0910 08/21/20 0414  WBC 8.5 7.6 6.5  NEUTROABS 6.9  --  4.5  HGB 15.4 15.0 14.4  HCT 47.5 46.8 45.1  MCV 88.8 91.4 90.6  PLT 140* 130* 174*   Basic Metabolic Panel: Recent Labs  Lab 08/21/20 0414 08/22/20 0347 08/23/20 0215 08/24/20 0855 08/26/20 0210  NA 141 140 141 142 140  K 3.7 3.8 3.3* 3.8 3.8  CL 110 110 108 110 107  CO2 21* 21* 25 24 24   GLUCOSE 191* 134* 108* 166* 131*  BUN 35* 29* 34* 32* 35*  CREATININE 2.34* 2.05* 2.35* 2.08* 2.29*  CALCIUM 9.3 9.0 9.1 9.6 9.6   GFR: Estimated Creatinine Clearance: 26.8  mL/min (A) (by C-G formula based on SCr of 2.29 mg/dL (H)). Liver Function Tests: Recent Labs  Lab 08/19/20 1419  AST 18  ALT 19  ALKPHOS 76  BILITOT 0.8  PROT 5.8*  ALBUMIN 3.3*   Coagulation Profile: Recent Labs  Lab 08/19/20 1419  INR 1.1   CBG: Recent Labs  Lab 08/25/20 0626 08/25/20 0832 08/25/20 1655 08/25/20 2152 08/26/20 0608  GLUCAP 147* 166* 251* 157* 146*    Recent Results (from the past 240 hour(s))  SARS Coronavirus 2 by RT PCR (hospital order, performed in [facility] hospital lab) Nasopharyngeal Nasopharyngeal Swab     Status: None   Collection Time: 08/20/20 12:21 PM   Specimen: Nasopharyngeal Swab  Result Value Ref Range Status   SARS Coronavirus 2 NEGATIVE NEGATIVE Final    Comment: (NOTE) SARS-CoV-2 target nucleic acids are NOT DETECTED.  The SARS-CoV-2 RNA is generally detectable in upper and lower respiratory specimens during the acute phase of infection. The lowest concentration of SARS-CoV-2 viral copies this assay can detect is 250 copies / mL. A negative result does not preclude SARS-CoV-2 infection and should not be used as the sole basis for treatment or other patient management decisions.  A negative  result may occur with improper specimen collection / handling, submission of specimen other than nasopharyngeal swab, presence of viral mutation(s) within the areas targeted by this assay, and inadequate number of viral copies (<250 copies / mL). A negative result must be combined with clinical observations, patient history, and epidemiological information.  Fact Sheet for Patients:   StrictlyIdeas.no  Fact Sheet for Healthcare Providers: BankingDealers.co.za  This test is not yet approved or  cleared by the [place] FDA and has been authorized for detection and/or diagnosis of SARS-CoV-2 by FDA under an Emergency Use Authorization (EUA).  This EUA will remain in effect (meaning this test can be used) for the duration of the COVID-19 declaration under Section 564(b)(1) of the Act, 21 U.S.C. section 360bbb-3(b)(1), unless the authorization is terminated or revoked sooner.  Performed at [facility], [place] [address] [place], [place] [address]   SARS Coronavirus 2 by RT PCR (hospital order, performed in [facility] hospital lab) Nasopharyngeal Nasopharyngeal Swab     Status: None   Collection Time: 08/24/20 11:18 AM   Specimen: Nasopharyngeal Swab  Result Value Ref Range Status   SARS Coronavirus 2 NEGATIVE NEGATIVE Final    Comment: (NOTE) SARS-CoV-2 target nucleic acids are NOT DETECTED.  The SARS-CoV-2 RNA is generally detectable in upper and lower respiratory specimens during the acute phase of infection. The lowest concentration of SARS-CoV-2 viral copies this assay can detect is 250 copies / mL. A negative result does not preclude SARS-CoV-2 infection and should not be used as the sole basis for treatment or other patient management decisions.  A negative result may occur with improper specimen collection / handling, submission of specimen other than nasopharyngeal swab, presence of viral mutation(s) within the areas  targeted by this assay, and inadequate number of viral copies (<250 copies / mL). A negative result must be combined with clinical observations, patient history, and epidemiological information.  Fact Sheet for Patients:   StrictlyIdeas.no  Fact Sheet for Healthcare Providers: BankingDealers.co.za  This test is not yet approved or  cleared by the [place] FDA and has been authorized for detection and/or diagnosis of SARS-CoV-2 by FDA under an Emergency Use Authorization (EUA).  This EUA will remain in effect (meaning this test can be used)  for the duration of the COVID-19 declaration under Section 564(b)(1) of the Act, 21 U.S.C. section 360bbb-3(b)(1), unless the authorization is terminated or revoked sooner.  Performed at [facility], [place] [address] [place], [place] [address]       Radiology Studies: No results found.    Scheduled Meds: . aspirin EC  81 mg Oral Daily  . atorvastatin  40 mg Oral QPM  . calcitRIOL  0.25 mcg Oral Daily  . cephALEXin  500 mg Oral Q6H  . clopidogrel  75 mg Oral Daily  . donepezil  10 mg Oral QHS  . DULoxetine  30 mg Oral QHS  . enoxaparin (LOVENOX) injection  30 mg Subcutaneous Daily  . furosemide  40 mg Oral BID  . insulin aspart  0-15 Units Subcutaneous TID WC  . insulin aspart  0-5 Units Subcutaneous QHS  . insulin glargine  25 Units Subcutaneous Daily  . levothyroxine  75 mcg Oral QAC breakfast  . metoprolol succinate  25 mg Oral Daily  . saccharomyces boulardii  250 mg Oral BID  . sertraline  50 mg Oral Daily  . sodium chloride flush  3 mL Intravenous Once   Continuous Infusions:    LOS: 6 days      Bonnielee Haff, [facility] 08/26/2020, 10:16 AM

## 2020-08-27 LAB — GLUCOSE, CAPILLARY
Glucose-Capillary: 129 mg/dL — ABNORMAL HIGH (ref 70–99)
Glucose-Capillary: 217 mg/dL — ABNORMAL HIGH (ref 70–99)
Glucose-Capillary: 151 mg/dL — ABNORMAL HIGH (ref 70–99)
Glucose-Capillary: 150 mg/dL — ABNORMAL HIGH (ref 70–99)

## 2020-08-27 NOTE — Progress Notes (Signed)
Physical Therapy Treatment Patient Details Name: [name] MRN: [id number] DOB: 1934-08-26 Today's Date: 08/27/2020    History of Present Illness Pt is an 86 y/o male admitted secondary to multiple falls and L sided weakness. Found to have R thalamic infarct. PMH inlucdes CAD s/p CABG, HTN, DM, CKD, and dementia.     PT Comments    Patient progressing well towards PT goals. Improved gait training today with Mod A to help with LLE advancement, stability and overall safety. Noted to have LLE fatigue especially with increased distance resulting in dragging of LLE and step to gait. Requires 1 seated rest break. Worked on sit to stand emphasizing equal WB through BUEs/LEs. Pt requires repetition to follow multi step commands. Son present. Continues to be a good CIR candidate. Will follow.   Follow Up Recommendations  CIR     Equipment Recommendations  Other (comment) (defer)    Recommendations for Other Services       Precautions / Restrictions Precautions Precautions: Fall Precaution Comments: Reports at least 5-6 falls within the past few days.  Restrictions Weight Bearing Restrictions: No    Mobility  Bed Mobility Overal bed mobility: Needs Assistance Bed Mobility: Supine to Sit     Supine to sit: Min guard;HOB elevated     General bed mobility comments: Able to get to EOB with use of rail and increased time.  Transfers Overall transfer level: Needs assistance Equipment used: Rolling walker (2 wheeled) Transfers: Sit to/from Stand Sit to Stand: Mod assist Stand pivot transfers: Min assist       General transfer comment: ASsist to power to standing with cues for foot/hand placement and anterior translation when coming forward. Pt with tendency for retropulsion. Stood from [employer], from chair x6, SPT bed to chair with Min A for [employer].  Ambulation/Gait Ambulation/Gait assistance: Mod assist Gait Distance (Feet): 30 Feet (x2 bouts) Assistive device:  Rolling walker (2 wheeled) Gait Pattern/deviations: Decreased step length - left;Step-to pattern;Shuffle;Trunk flexed;Step-through pattern Gait velocity: decr Gait velocity interpretation: <1.31 ft/sec, indicative of household ambulator General Gait Details: Slow, mildly unsteady gait with manual cues to assist with LLE advancement and for left knee support during stance phase. Fatigues resulting in decreased left foot clearance and step to gait. 2 seated rest break.   Stairs             Wheelchair Mobility    Modified Rankin (Stroke Patients Only) Modified Rankin (Stroke Patients Only) Pre-Morbid Rankin Score: No significant disability Modified Rankin: Moderately severe disability     Balance Overall balance assessment: Needs assistance Sitting-balance support: Feet supported;No upper extremity supported Sitting balance-Leahy Scale: Good     Standing balance support: During functional activity;Bilateral upper extremity supported Standing balance-Leahy Scale: Poor Standing balance comment: UE assist on RW and reliant on external assist                            Cognition Arousal/Alertness: Awake/alert Behavior During Therapy: WFL for tasks assessed/performed Overall Cognitive Status: History of cognitive impairments - at baseline                                 General Comments: Requires repetition to perform multi step tasks, impaired memory.      Exercises Other Exercises Other Exercises: STS x3 from chair emphasizing proper mechanics and forward translation, equal WB through BUEs/LEs and controlled descent,  General Comments General comments (skin integrity, edema, etc.): Son present during session.      Pertinent Vitals/Pain Pain Assessment: No/denies pain    Home Living                      Prior Function            PT Goals (current goals can now be found in the care plan section) Progress towards PT goals:  Progressing toward goals    Frequency    Min 3X/week      PT Plan Current plan remains appropriate    Co-evaluation              AM-PAC PT "6 Clicks" Mobility   Outcome Measure  Help needed turning from your back to your side while in a flat bed without using bedrails?: A Little Help needed moving from lying on your back to sitting on the side of a flat bed without using bedrails?: A Little Help needed moving to and from a bed to a chair (including a wheelchair)?: A Lot Help needed standing up from a chair using your arms (e.g., wheelchair or bedside chair)?: A Lot Help needed to walk in hospital room?: A Lot Help needed climbing 3-5 steps with a railing? : A Lot 6 Click Score: 14    End of Session Equipment Utilized During Treatment: Gait belt Activity Tolerance: Patient tolerated treatment well Patient left: in chair;with chair alarm set;with family/visitor present;with call bell/phone within reach Nurse Communication: Mobility status PT Visit Diagnosis: Unsteadiness on feet (R26.81);Muscle weakness (generalized) (M62.81);Difficulty in walking, not elsewhere classified (R26.2);History of falling (Z91.81);Repeated falls (R29.6);Hemiplegia and hemiparesis Hemiplegia - Right/Left: Left Hemiplegia - dominant/non-dominant: Non-dominant Hemiplegia - caused by: Cerebral infarction     Time: 1131-1200 PT Time Calculation (min) (ACUTE ONLY): 29 min  Charges:  $Gait Training: 23-37 mins                     Marisa Severin, PT, DPT Acute Rehabilitation Services Pager [phone] Office [place] 08/27/2020, 1:35 PM

## 2020-08-27 NOTE — Progress Notes (Signed)
Inpatient Rehab Admissions Coordinator:   I spoke with Pt.'s daughter, Romie Minus, over the phone and she stated that Pt.'s family do not want him to go to SNF and would like to pursue peer-to-peer and appeal a denial if one is issued following peer-to-peer.  Clemens Catholic, [place], [place] Admissions Coordinator  [phone] ([place]) [phone] (office)

## 2020-08-27 NOTE — Progress Notes (Signed)
Inpatient Rehab Admissions Coordinator:   I met with patient to let him know that his insurance ([employer]) did not approve our request for CIR admission, but did state that they would approve stay in SNF. They offered an option to do a peer-to-peer with patients doctor, but pt. Is unsure if he wants to continue to pursue CIR. He requested that I call his daughter, Danella Penton, to see what she thinks.   Clemens Catholic, [place], [place] Admissions Coordinator  [phone] ([place]) [phone] (office)

## 2020-08-27 NOTE — Progress Notes (Signed)
PROGRESS NOTE    [name]  PYP:[id number] DOB: Sep 22, 1934 DOA: 08/19/2020 PCP: Hulan Fess, MD     Brief Narrative:  [name] is an 86 y.o. male with medical history significant of coronary artery disease status post CABG, hypertension, hyperlipidemia, type 2 diabetes mellitus, CKD stage IIIb, hypothyroidism, depression/anxiety, memory difficulty, chronic bilateral leg swelling, AAA status post repair, who presents to emergency department with recurrent falls and left-sided weakness started 2 days ago.  Rash on left arm which he noticed yesterday. Patient reports left-sided weakness which started on Thursday evening.  He typically ambulates with cane at baseline and fell 3 times on Thursday and 2 times on Friday.  Reports difficulty with ambulation due to his left leg giving out.  CT head shows small focus of ill-defined hypoattenuation within the right thalamus capsular junction which is new as compared to the prior head CT and likely reflects an acute/early subacute lacunar infarct.  Neurology was consulted and patient admitted for stroke work-up.  Subjective: Patient denies any complaints this morning.  Feels well.  Assessment & Plan:   Principal Problem:   Ischemic stroke [facility]) Active Problems:   Coronary artery disease   Essential hypertension   Hyperlipidemia   DM (diabetes mellitus) (HCC)   Chronic kidney disease, stage IV (severe) (HCC)   Thrombocytopenia (HCC)   Hypothyroidism   Dementia (HCC)   Depression   Cellulitis   Acute right thalamus and internal capsule CVA -CT head: Small focus of ill-defined hypoattenuation within the right thalamocapsular junction which is new as compared to the prior head CT of 03/18/2020 and likely reflects an acute/early subacute lacunar infarct given the provided history. -MRI brain: Acute/subacute 13 mm infarct involving the lateral right thalamus and internal capsule with small focus of central petechial hemorrhage. -Echo: No  intracardiac source of embolism detected on this transthoracic study -Carotid duplex US: 1-39% stenosis bilaterally   LDL of 96.  HbA1c 10.6. -Continue aspirin and Plavix for 3 weeks, followed by Plavix alone -Lipitor -Follow-up with neurology in 6 weeks Patient seen by PT and OT.  Initially skilled nursing facility was recommended.  Family wanted to pursue inpatient rehabilitation.  Evaluation in progress. Patient remains stable from a neurological standpoint.  Left elbow cellulitis Improved.  He has completed 5-day course of cephalexin.  Diarrhea appears to be resolving.  Essential hypertension Initially permissive hypertension was allowed.  Subsequently metoprolol was resumed.  Heart rate reasonably well controlled for the most part.  Occasional high readings noted.  He was also started back on his furosemide.    Diabetes mellitus type 2, uncontrolled with hyperglycemia Hemoglobin A1c 10.6.  Hypoglycemic episodes noted on 9/15.  Lantus dose was decreased.  CBGs are stable for the most part.  Continue current dose of Lantus.   Hyperlipidemia Continue Lipitor.  LDL 96.  CKD stage IV/hypokalemia Baseline creatinine 2.6.  Renal function has been close to baseline.  Monitor urine output.  Continue furosemide.  Hypothyroidism Continue Synthroid  Coronary artery disease status post CABG Stable.  Continue beta-blocker and platelet agents and statin.  Depression, anxiety Continue Zoloft, Cymbalta  Dementia Continue Aricept   DVT prophylaxis: Lovenox Code Status: Full code Family Communication: No family at bedside Disposition Plan: CIR  Status is: Inpatient  Remains inpatient appropriate because:Unsafe d/c plan   Dispo: The patient is from: Home              Anticipated d/c is to: CIR  Anticipated d/c date is: Depending on insurance authorization and bed availability              Patient currently is medically stable to d/c.     Consultants:   Neurology   Procedures:   None  Antimicrobials:  Anti-infectives (From admission, onward)   Start     Dose/Rate Route Frequency Ordered Stop   08/21/20 1400  cephALEXin (KEFLEX) capsule 500 mg        500 mg Oral Every 6 hours 08/21/20 1355 08/26/20 1159   08/20/20 1115  cefTRIAXone (ROCEPHIN) 1 g in sodium chloride 0.9 % 100 mL IVPB  Status:  Discontinued        1 g 200 mL/hr over 30 Minutes Intravenous Every 24 hours 08/20/20 1102 08/21/20 1355   08/20/20 1000  cefTRIAXone (ROCEPHIN) 1 g in sodium chloride 0.9 % 100 mL IVPB  Status:  Discontinued        1 g 200 mL/hr over 30 Minutes Intravenous Every 24 hours 08/20/20 0950 08/20/20 0951   08/20/20 0800  ceFAZolin (ANCEF) IVPB 1 g/50 mL premix        1 g 100 mL/hr over 30 Minutes Intravenous  Once 08/20/20 0757 08/20/20 1224       Objective: Vitals:   08/26/20 2024 08/26/20 2321 08/27/20 0321 08/27/20 0802  BP: 130/72 137/77 (!) 144/94 (!) 168/75  Pulse: 62 63 62 (!) 57  Resp: 17 16 18 18   Temp: 98 F (36.7 C) 97.6 F (36.4 C) 98 F (36.7 C) 98 F (36.7 C)  TempSrc:  Oral    SpO2: 98% 97% 98% 97%  Weight:      Height:       No intake or output data in the 24 hours ending 08/27/20 0948 Filed Weights   08/21/20 1841  Weight: 87.7 kg    Examination:  General appearance: Awake alert.  In no distress Resp: Clear to auscultation bilaterally.  Normal effort Cardio: S1-S2 is normal regular.  No S3-S4.  No rubs murmurs or bruit GI: Abdomen is soft.  Nontender nondistended.  Bowel sounds are present normal.  No masses organomegaly Extremities: No edema.   Neurologic: Left-sided weakness as before.      Data Reviewed: I have personally reviewed following labs and imaging studies  CBC: Recent Labs  Lab 08/21/20 0414  WBC 6.5  NEUTROABS 4.5  HGB 14.4  HCT 45.1  MCV 90.6  PLT 450*   Basic Metabolic Panel: Recent Labs  Lab 08/21/20 0414 08/22/20 0347 08/23/20 0215 08/24/20 0855 08/26/20 0210  NA 141 140 141 142 140   K 3.7 3.8 3.3* 3.8 3.8  CL 110 110 108 110 107  CO2 21* 21* 25 24 24   GLUCOSE 191* 134* 108* 166* 131*  BUN 35* 29* 34* 32* 35*  CREATININE 2.34* 2.05* 2.35* 2.08* 2.29*  CALCIUM 9.3 9.0 9.1 9.6 9.6   GFR: Estimated Creatinine Clearance: 26.8 mL/min (A) (by C-G formula based on SCr of 2.29 mg/dL (H)).  CBG: Recent Labs  Lab 08/26/20 0608 08/26/20 1110 08/26/20 1534 08/26/20 2110 08/27/20 0628  GLUCAP 146* 164* 273* 133* 129*    Recent Results (from the past 240 hour(s))  SARS Coronavirus 2 by RT PCR (hospital order, performed in [facility] hospital lab) Nasopharyngeal Nasopharyngeal Swab     Status: None   Collection Time: 08/20/20 12:21 PM   Specimen: Nasopharyngeal Swab  Result Value Ref Range Status   SARS Coronavirus 2 NEGATIVE NEGATIVE Final  Comment: (NOTE) SARS-CoV-2 target nucleic acids are NOT DETECTED.  The SARS-CoV-2 RNA is generally detectable in upper and lower respiratory specimens during the acute phase of infection. The lowest concentration of SARS-CoV-2 viral copies this assay can detect is 250 copies / mL. A negative result does not preclude SARS-CoV-2 infection and should not be used as the sole basis for treatment or other patient management decisions.  A negative result may occur with improper specimen collection / handling, submission of specimen other than nasopharyngeal swab, presence of viral mutation(s) within the areas targeted by this assay, and inadequate number of viral copies (<250 copies / mL). A negative result must be combined with clinical observations, patient history, and epidemiological information.  Fact Sheet for Patients:   StrictlyIdeas.no  Fact Sheet for Healthcare Providers: BankingDealers.co.za  This test is not yet approved or  cleared by the [place] FDA and has been authorized for detection and/or diagnosis of SARS-CoV-2 by FDA under an Emergency Use Authorization  (EUA).  This EUA will remain in effect (meaning this test can be used) for the duration of the COVID-19 declaration under Section 564(b)(1) of the Act, 21 U.S.C. section 360bbb-3(b)(1), unless the authorization is terminated or revoked sooner.  Performed at [facility], [place] [address], [place], [place] [address]   SARS Coronavirus 2 by RT PCR (hospital order, performed in [facility] hospital lab) Nasopharyngeal Nasopharyngeal Swab     Status: None   Collection Time: 08/24/20 11:18 AM   Specimen: Nasopharyngeal Swab  Result Value Ref Range Status   SARS Coronavirus 2 NEGATIVE NEGATIVE Final    Comment: (NOTE) SARS-CoV-2 target nucleic acids are NOT DETECTED.  The SARS-CoV-2 RNA is generally detectable in upper and lower respiratory specimens during the acute phase of infection. The lowest concentration of SARS-CoV-2 viral copies this assay can detect is 250 copies / mL. A negative result does not preclude SARS-CoV-2 infection and should not be used as the sole basis for treatment or other patient management decisions.  A negative result may occur with improper specimen collection / handling, submission of specimen other than nasopharyngeal swab, presence of viral mutation(s) within the areas targeted by this assay, and inadequate number of viral copies (<250 copies / mL). A negative result must be combined with clinical observations, patient history, and epidemiological information.  Fact Sheet for Patients:   StrictlyIdeas.no  Fact Sheet for Healthcare Providers: BankingDealers.co.za  This test is not yet approved or  cleared by the [place] FDA and has been authorized for detection and/or diagnosis of SARS-CoV-2 by FDA under an Emergency Use Authorization (EUA).  This EUA will remain in effect (meaning this test can be used) for the duration of the COVID-19 declaration under Section 564(b)(1) of the Act, 21 U.S.C.  section 360bbb-3(b)(1), unless the authorization is terminated or revoked sooner.  Performed at [facility], [place] [address], [place], [place] [address]       Radiology Studies: No results found.    Scheduled Meds: . aspirin EC  81 mg Oral Daily  . atorvastatin  40 mg Oral QPM  . calcitRIOL  0.25 mcg Oral Daily  . clopidogrel  75 mg Oral Daily  . donepezil  10 mg Oral QHS  . DULoxetine  30 mg Oral QHS  . enoxaparin (LOVENOX) injection  30 mg Subcutaneous Daily  . furosemide  40 mg Oral BID  . insulin aspart  0-15 Units Subcutaneous TID WC  . insulin aspart  0-5 Units Subcutaneous  QHS  . insulin glargine  25 Units Subcutaneous Daily  . levothyroxine  75 mcg Oral QAC breakfast  . metoprolol succinate  25 mg Oral Daily  . saccharomyces boulardii  250 mg Oral BID  . sertraline  50 mg Oral Daily  . sodium chloride flush  3 mL Intravenous Once   Continuous Infusions:    LOS: 7 days      Bonnielee Haff, [facility] 08/27/2020, 9:48 AM

## 2020-08-27 NOTE — Progress Notes (Signed)
Inpatient Rehab Admissions Coordinator:   Pt. Received a denial from his insurance for CIR admission. I notified Pt. And family and gave them a letter from his insurance.Pt.'s family wishes to appeal this decision. I will start on his appeal tomorrow when his insurance opens.   Clemens Catholic, [place], [place] Admissions Coordinator  [phone] ([place]) [phone] (office)

## 2020-08-28 ENCOUNTER — Ambulatory Visit: Payer: HMO | Admitting: Neurology

## 2020-08-28 LAB — BASIC METABOLIC PANEL
Anion gap: 10 (ref 5–15)
BUN: 50 mg/dL — ABNORMAL HIGH (ref 8–23)
CO2: 27 mmol/L (ref 22–32)
Calcium: 9.3 mg/dL (ref 8.9–10.3)
Chloride: 104 mmol/L (ref 98–111)
Creatinine, Ser: 2.92 mg/dL — ABNORMAL HIGH (ref 0.61–1.24)
GFR calc Af Amer: 22 mL/min — ABNORMAL LOW (ref >60)
GFR calc non Af Amer: 19 mL/min — ABNORMAL LOW (ref >60)
Glucose, Bld: 138 mg/dL — ABNORMAL HIGH (ref 70–99)
Potassium: 3.6 mmol/L (ref 3.5–5.1)
Sodium: 141 mmol/L (ref 135–145)

## 2020-08-28 LAB — GLUCOSE, CAPILLARY
Glucose-Capillary: 177 mg/dL — ABNORMAL HIGH (ref 70–99)
Glucose-Capillary: 146 mg/dL — ABNORMAL HIGH (ref 70–99)
Glucose-Capillary: 122 mg/dL — ABNORMAL HIGH (ref 70–99)
Glucose-Capillary: 161 mg/dL — ABNORMAL HIGH (ref 70–99)
Glucose-Capillary: 111 mg/dL — ABNORMAL HIGH (ref 70–99)

## 2020-08-28 NOTE — Progress Notes (Signed)
Inpatient Rehab Admissions Coordinator:   Pt.'s insurance denied inpatient rehab authorization and Pt. And family stated desire to appeal. I informed them that the decision is unlikely to be reversed, but they still wish to pursue appeal. Appeal was officially opened at 1300 on 9/20. [employer] has 72 hours to issue a decision. Pt. And family are aware that SNF bed offer expires at 5 pm 08/29/20  Clemens Catholic, [place], [place] Admissions Coordinator  [phone] ([place]) [phone] (office)

## 2020-08-28 NOTE — TOC Progression Note (Signed)
Transition of Care [facility]) - Progression Note    Patient Details  Name: AMAAD BYERS MRN: [id number] Date of Birth: 04-08-1934  Transition of Care [facility]) CM/SW [place], [place] Phone Number: 08/28/2020, 10:31 AM  Clinical Narrative:    CSW received call from [facility] at [facility] [phone]) informing of this pt's insurance auth for SNF closing at 5 pm 08/29/2020. She was informed that family is refusing SNF and appealing for CIR.     Expected Discharge Plan: [place] Barriers to Discharge: Continued Medical Work up, SNF Pending bed offer  Expected Discharge Plan and Services Expected Discharge Plan: [place] In-house Referral: Clinical Social Work   Post Acute Care Choice: [place] Living arrangements for the past 2 months: Single Family Home                                       Social Determinants of Health (SDOH) Interventions    Readmission Risk Interventions No flowsheet data found.

## 2020-08-28 NOTE — Progress Notes (Addendum)
PROGRESS NOTE    [name]  XQJ:[id number] DOB: 1934/10/11 DOA: 08/19/2020 PCP: Hulan Fess, MD     Brief Narrative:  [name] is an 86 y.o. male with medical history significant of coronary artery disease status post CABG, hypertension, hyperlipidemia, type 2 diabetes mellitus, CKD stage IIIb, hypothyroidism, depression/anxiety, memory difficulty, chronic bilateral leg swelling, AAA status post repair, who presents to emergency department with recurrent falls and left-sided weakness started 2 days ago.  Rash on left arm which he noticed yesterday. Patient reports left-sided weakness which started on Thursday evening.  He typically ambulates with cane at baseline and fell 3 times on Thursday and 2 times on Friday.  Reports difficulty with ambulation due to his left leg giving out.  CT head shows small focus of ill-defined hypoattenuation within the right thalamus capsular junction which is new as compared to the prior head CT and likely reflects an acute/early subacute lacunar infarct.  Neurology was consulted and patient admitted for stroke work-up.  Subjective: Patient denies any complaints.  Feels well.  Assessment & Plan:   Principal Problem:   Ischemic stroke [facility]) Active Problems:   Coronary artery disease   Essential hypertension   Hyperlipidemia   DM (diabetes mellitus) (HCC)   Chronic kidney disease, stage IV (severe) (HCC)   Thrombocytopenia (HCC)   Hypothyroidism   Dementia (HCC)   Depression   Cellulitis   Acute right thalamus and internal capsule CVA -CT head: Small focus of ill-defined hypoattenuation within the right thalamocapsular junction which is new as compared to the prior head CT of 03/18/2020 and likely reflects an acute/early subacute lacunar infarct given the provided history. -MRI brain: Acute/subacute 13 mm infarct involving the lateral right thalamus and internal capsule with small focus of central petechial hemorrhage. -Echo: No intracardiac  source of embolism detected on this transthoracic study -Carotid duplex US: 1-39% stenosis bilaterally   LDL of 96.  HbA1c 10.6. -Continue aspirin and Plavix for 3 weeks, followed by Plavix alone.  Aspirin started on 9/14 and to stop on 09/11/20. -Lipitor -Follow-up with neurology in 6 weeks Patient seen by PT and OT.  Initially skilled nursing facility was recommended.  Family wanted to pursue inpatient rehabilitation.  CIR denied by insurance.  Family plans to appeal. Patient remains stable from a neurological standpoint.  Left elbow cellulitis Improved.  He has completed 5-day course of cephalexin.  Diarrhea appears to have resolved.  Essential hypertension Initially permissive hypertension was allowed.  Subsequently metoprolol was resumed.  Blood pressure is reasonably well controlled for the most part.  Occasional high readings noted.  Furosemide was resumed but today his creatinine seems to have gone up.  We will hold for now.   Diabetes mellitus type 2, uncontrolled with hyperglycemia Hemoglobin A1c 10.6.  Hypoglycemic episodes noted on 9/15.  Lantus dose was decreased.  CBGs are stable for the most part.  Continue current dose of Lantus.   Hyperlipidemia Continue Lipitor.  LDL 96.  Acute on CKD stage IV/hypokalemia Baseline creatinine 2.6.  Creatinine noted to have climbed this morning.  We will hold furosemide for now.  Recheck labs tomorrow.  Monitor urine output.  Hypothyroidism Continue Synthroid  Coronary artery disease status post CABG Stable.  Continue beta-blocker and platelet agents and statin.  Depression, anxiety Continue Zoloft, Cymbalta  Dementia Continue Aricept   DVT prophylaxis: Lovenox Code Status: Full code Family Communication: No family at bedside Disposition Plan: CIR  Status is: Inpatient  Remains inpatient appropriate because:Unsafe d/c plan  Dispo: The patient is from: Home              Anticipated d/c is to: CIR               Anticipated d/c date is: Depending on insurance authorization and bed availability              Patient currently is medically stable to d/c.     Consultants:   Neurology  Procedures:   None  Antimicrobials:  Anti-infectives (From admission, onward)   Start     Dose/Rate Route Frequency Ordered Stop   08/21/20 1400  cephALEXin (KEFLEX) capsule 500 mg        500 mg Oral Every 6 hours 08/21/20 1355 08/26/20 1159   08/20/20 1115  cefTRIAXone (ROCEPHIN) 1 g in sodium chloride 0.9 % 100 mL IVPB  Status:  Discontinued        1 g 200 mL/hr over 30 Minutes Intravenous Every 24 hours 08/20/20 1102 08/21/20 1355   08/20/20 1000  cefTRIAXone (ROCEPHIN) 1 g in sodium chloride 0.9 % 100 mL IVPB  Status:  Discontinued        1 g 200 mL/hr over 30 Minutes Intravenous Every 24 hours 08/20/20 0950 08/20/20 0951   08/20/20 0800  ceFAZolin (ANCEF) IVPB 1 g/50 mL premix        1 g 100 mL/hr over 30 Minutes Intravenous  Once 08/20/20 0757 08/20/20 1224       Objective: Vitals:   08/27/20 2058 08/28/20 0000 08/28/20 0355 08/28/20 0845  BP: (!) 132/49 (!) 154/71 (!) 149/69 (!) 159/67  Pulse: 62 64 (!) 58 60  Resp: 18 18 18    Temp: 98 F (36.7 C) 98.2 F (36.8 C) 98.1 F (36.7 C) 98.7 F (37.1 C)  TempSrc: Oral Oral Oral Oral  SpO2: 96% 100% 96% 95%  Weight:      Height:        Intake/Output Summary (Last 24 hours) at 08/28/2020 0921 Last data filed at 08/28/2020 0057 Gross per 24 hour  Intake --  Output 900 ml  Net -900 ml   Filed Weights   08/21/20 1841  Weight: 87.7 kg    Examination:  General appearance: Awake alert.  In no distress Resp: Clear to auscultation bilaterally.  Normal effort Cardio: S1-S2 is normal regular.  No S3-S4.  No rubs murmurs or bruit GI: Abdomen is soft.  Nontender nondistended.  Bowel sounds are present normal.  No masses organomegaly Extremities: No edema. Neurologic: Left-sided weakness    Data Reviewed: I have personally reviewed following  labs and imaging studies  CBC: No results for input(s): WBC, NEUTROABS, HGB, HCT, MCV, PLT in the last 168 hours. Basic Metabolic Panel: Recent Labs  Lab 08/22/20 0347 08/23/20 0215 08/24/20 0855 08/26/20 0210 08/28/20 0146  NA 140 141 142 140 141  K 3.8 3.3* 3.8 3.8 3.6  CL 110 108 110 107 104  CO2 21* 25 24 24 27   GLUCOSE 134* 108* 166* 131* 138*  BUN 29* 34* 32* 35* 50*  CREATININE 2.05* 2.35* 2.08* 2.29* 2.92*  CALCIUM 9.0 9.1 9.6 9.6 9.3   GFR: Estimated Creatinine Clearance: 21 mL/min (A) (by C-G formula based on SCr of 2.92 mg/dL (H)).  CBG: Recent Labs  Lab 08/27/20 1205 08/27/20 1607 08/27/20 2203 08/28/20 0607 08/28/20 0852  GLUCAP 217* 151* 150* 177* 146*    Recent Results (from the past 240 hour(s))  SARS Coronavirus 2 by RT PCR (hospital order, performed in  [facility] hospital lab) Nasopharyngeal Nasopharyngeal Swab     Status: None   Collection Time: 08/20/20 12:21 PM   Specimen: Nasopharyngeal Swab  Result Value Ref Range Status   SARS Coronavirus 2 NEGATIVE NEGATIVE Final    Comment: (NOTE) SARS-CoV-2 target nucleic acids are NOT DETECTED.  The SARS-CoV-2 RNA is generally detectable in upper and lower respiratory specimens during the acute phase of infection. The lowest concentration of SARS-CoV-2 viral copies this assay can detect is 250 copies / mL. A negative result does not preclude SARS-CoV-2 infection and should not be used as the sole basis for treatment or other patient management decisions.  A negative result may occur with improper specimen collection / handling, submission of specimen other than nasopharyngeal swab, presence of viral mutation(s) within the areas targeted by this assay, and inadequate number of viral copies (<250 copies / mL). A negative result must be combined with clinical observations, patient history, and epidemiological information.  Fact Sheet for Patients:   StrictlyIdeas.no  Fact  Sheet for Healthcare Providers: BankingDealers.co.za  This test is not yet approved or  cleared by the [place] FDA and has been authorized for detection and/or diagnosis of SARS-CoV-2 by FDA under an Emergency Use Authorization (EUA).  This EUA will remain in effect (meaning this test can be used) for the duration of the COVID-19 declaration under Section 564(b)(1) of the Act, 21 U.S.C. section 360bbb-3(b)(1), unless the authorization is terminated or revoked sooner.  Performed at [facility], [place] [address], [place], [place] [address]   SARS Coronavirus 2 by RT PCR (hospital order, performed in [facility] hospital lab) Nasopharyngeal Nasopharyngeal Swab     Status: None   Collection Time: 08/24/20 11:18 AM   Specimen: Nasopharyngeal Swab  Result Value Ref Range Status   SARS Coronavirus 2 NEGATIVE NEGATIVE Final    Comment: (NOTE) SARS-CoV-2 target nucleic acids are NOT DETECTED.  The SARS-CoV-2 RNA is generally detectable in upper and lower respiratory specimens during the acute phase of infection. The lowest concentration of SARS-CoV-2 viral copies this assay can detect is 250 copies / mL. A negative result does not preclude SARS-CoV-2 infection and should not be used as the sole basis for treatment or other patient management decisions.  A negative result may occur with improper specimen collection / handling, submission of specimen other than nasopharyngeal swab, presence of viral mutation(s) within the areas targeted by this assay, and inadequate number of viral copies (<250 copies / mL). A negative result must be combined with clinical observations, patient history, and epidemiological information.  Fact Sheet for Patients:   StrictlyIdeas.no  Fact Sheet for Healthcare Providers: BankingDealers.co.za  This test is not yet approved or  cleared by the [place] FDA and has been  authorized for detection and/or diagnosis of SARS-CoV-2 by FDA under an Emergency Use Authorization (EUA).  This EUA will remain in effect (meaning this test can be used) for the duration of the COVID-19 declaration under Section 564(b)(1) of the Act, 21 U.S.C. section 360bbb-3(b)(1), unless the authorization is terminated or revoked sooner.  Performed at [facility], [place] [address], [place], [place] [address]       Radiology Studies: No results found.    Scheduled Meds: . aspirin EC  81 mg Oral Daily  . atorvastatin  40 mg Oral QPM  . calcitRIOL  0.25 mcg Oral Daily  . clopidogrel  75 mg Oral Daily  . donepezil  10 mg Oral QHS  .  DULoxetine  30 mg Oral QHS  . enoxaparin (LOVENOX) injection  30 mg Subcutaneous Daily  . insulin aspart  0-15 Units Subcutaneous TID WC  . insulin aspart  0-5 Units Subcutaneous QHS  . insulin glargine  25 Units Subcutaneous Daily  . levothyroxine  75 mcg Oral QAC breakfast  . metoprolol succinate  25 mg Oral Daily  . saccharomyces boulardii  250 mg Oral BID  . sertraline  50 mg Oral Daily  . sodium chloride flush  3 mL Intravenous Once   Continuous Infusions:    LOS: 8 days      Bonnielee Haff, [facility] 08/28/2020, 9:21 AM

## 2020-08-28 NOTE — Plan of Care (Signed)

## 2020-08-29 LAB — BASIC METABOLIC PANEL
Anion gap: 11 (ref 5–15)
BUN: 45 mg/dL — ABNORMAL HIGH (ref 8–23)
CO2: 22 mmol/L (ref 22–32)
Calcium: 9.5 mg/dL (ref 8.9–10.3)
Chloride: 108 mmol/L (ref 98–111)
Creatinine, Ser: 2.41 mg/dL — ABNORMAL HIGH (ref 0.61–1.24)
GFR calc Af Amer: 28 mL/min — ABNORMAL LOW (ref >60)
GFR calc non Af Amer: 24 mL/min — ABNORMAL LOW (ref >60)
Glucose, Bld: 105 mg/dL — ABNORMAL HIGH (ref 70–99)
Potassium: 3.6 mmol/L (ref 3.5–5.1)
Sodium: 141 mmol/L (ref 135–145)

## 2020-08-29 LAB — GLUCOSE, CAPILLARY
Glucose-Capillary: 109 mg/dL — ABNORMAL HIGH (ref 70–99)
Glucose-Capillary: 174 mg/dL — ABNORMAL HIGH (ref 70–99)
Glucose-Capillary: 147 mg/dL — ABNORMAL HIGH (ref 70–99)
Glucose-Capillary: 125 mg/dL — ABNORMAL HIGH (ref 70–99)

## 2020-08-29 LAB — SARS CORONAVIRUS 2 BY RT PCR (HOSPITAL ORDER, PERFORMED IN ~~LOC~~ HOSPITAL LAB): SARS Coronavirus 2: NEGATIVE

## 2020-08-29 MED ORDER — AMLODIPINE BESYLATE 5 MG PO TABS
5.0000 mg | ORAL_TABLET | Freq: Every day | ORAL | Status: DC
  Administered 2020-08-29 – 2020-08-30 (×2): 5 mg via ORAL
  Filled 2020-08-29 (×2): qty 1

## 2020-08-29 NOTE — Progress Notes (Signed)
Inpatient Rehab Admissions Coordinator:    I received a message from [facility] that Pt.'s denial for CIR admission was not overturned and [employer] has sent it for independent revew with another company. They have 72 hours to make a decision and that timeframe starts today. I have communicated to the Pt. that the likelihood of them approving CIR at this point is very small. Case Manager is looking to see if their preferred SNF has a bed, but the family states that they want to stay until receiving a final answer from insurance. Pt. Is medically ready for discharge and I let them know that it may not be possible for him to stay here another 72 hours awaiting insurance.  Clemens Catholic, [place], [place] Admissions Coordinator  [phone] ([place]) [phone] (office)

## 2020-08-29 NOTE — Progress Notes (Signed)
Physical Therapy Treatment Patient Details Name: [name] MRN: [id number] DOB: [birth year] Today's Date: 08/29/2020    History of Present Illness Pt is an 85 y/o male admitted secondary to multiple falls and L sided weakness. Found to have R thalamic infarct. PMH inlucdes CAD s/p CABG, HTN, DM, CKD, and dementia.     PT Comments    Pt reporting feeling weak today, but agreeable to OOB mobility. Pt ambulated to and from bathroom with mod PT assist, requiring PT help for weight shifting, LLE swing and stance phase, and steadying. Pt with one period of slow forward truncal flexion and lowering towards floor, requiring posterior PT assist and BSC being brought behind pt to avoid fall. Pt with little awareness of this until sitting. Pt's insurance denied CIR, PT recommending SNF as secondary option. Will continue to follow acutely.     Follow Up Recommendations  CIR;SNF     Equipment Recommendations  Other (comment) (defer)    Recommendations for Other Services       Precautions / Restrictions Precautions Precautions: Fall Precaution Comments: Reports at least 5-6 falls within the past few days.  Restrictions Weight Bearing Restrictions: No    Mobility  Bed Mobility Overal bed mobility: Needs Assistance Bed Mobility: Supine to Sit;Sit to Supine     Supine to sit: Min assist Sit to supine: Mod assist   General bed mobility comments: min-mod assist for supine<>sit for trunk and LE management, scooting to and from EOB..  Transfers Overall transfer level: Needs assistance Equipment used: Rolling walker (2 wheeled) Transfers: Sit to/from Stand Sit to Stand: Mod assist         General transfer comment: Mod assist for power up, steadying, and LLE blocking. STS x3, with stand pivot x2 (from [facility] both times).  Ambulation/Gait Ambulation/Gait assistance: Mod assist Gait Distance (Feet): 15 Feet (2x15 ft) Assistive device: Rolling walker (2 wheeled) Gait Pattern/deviations:  Decreased step length - left;Step-to pattern;Shuffle;Trunk flexed;Step-through pattern;Decreased stride length Gait velocity: decr   General Gait Details: Mod assist for steadying, LLE blocking in stance phase, LLE swing facilitation in swing phase, placement LUE on RW. Seated rest break at 15 ft, pt slowly lowering self requiring PT posterior support and bringing BSC behind pt to avoid fall.   Stairs             Wheelchair Mobility    Modified Rankin (Stroke Patients Only) Modified Rankin (Stroke Patients Only) Pre-Morbid Rankin Score: No significant disability Modified Rankin: Moderately severe disability     Balance Overall balance assessment: Needs assistance Sitting-balance support: Feet supported;No upper extremity supported Sitting balance-Leahy Scale: Good     Standing balance support: During functional activity;Bilateral upper extremity supported Standing balance-Leahy Scale: Poor Standing balance comment: reliant on external support                            Cognition Arousal/Alertness: Awake/alert Behavior During Therapy: WFL for tasks assessed/performed Overall Cognitive Status: History of cognitive impairments - at baseline                                 General Comments: Requires repetition to perform mobility tasks, poor safety awareness with pt RUE reaching for environment instead of holding RW even when cued.      Exercises      General Comments General comments (skin integrity, edema, etc.): son present during session. Pt  required pericare x2 during session, with stool incontinence      Pertinent Vitals/Pain Pain Assessment: Faces Faces Pain Scale: Hurts a little bit Pain Location: generalized Pain Descriptors / Indicators: Grimacing Pain Intervention(s): Monitored during session;Limited activity within patient's tolerance    Home Living                      Prior Function            PT Goals  (current goals can now be found in the care plan section) Acute Rehab PT Goals PT Goal Formulation: With patient/family Time For Goal Achievement: 09/04/20 Potential to Achieve Goals: Good Progress towards PT goals: Progressing toward goals    Frequency    Min 3X/week      PT Plan Discharge plan needs to be updated    Co-evaluation              AM-PAC PT "6 Clicks" Mobility   Outcome Measure  Help needed turning from your back to your side while in a flat bed without using bedrails?: A Little Help needed moving from lying on your back to sitting on the side of a flat bed without using bedrails?: A Lot Help needed moving to and from a bed to a chair (including a wheelchair)?: A Lot Help needed standing up from a chair using your arms (e.g., wheelchair or bedside chair)?: A Lot Help needed to walk in hospital room?: A Lot Help needed climbing 3-5 steps with a railing? : A Lot 6 Click Score: 13    End of Session Equipment Utilized During Treatment: Gait belt Activity Tolerance: Patient tolerated treatment well Patient left: in chair;with chair alarm set;with family/visitor present;with call bell/phone within reach Nurse Communication: Mobility status PT Visit Diagnosis: Unsteadiness on feet (R26.81);Muscle weakness (generalized) (M62.81);Difficulty in walking, not elsewhere classified (R26.2);History of falling (Z91.81);Repeated falls (R29.6);Hemiplegia and hemiparesis Hemiplegia - Right/Left: Left Hemiplegia - dominant/non-dominant: Non-dominant Hemiplegia - caused by: Cerebral infarction     Time: 1411-1440 PT Time Calculation (min) (ACUTE ONLY): 29 min  Charges:  $Gait Training: 8-22 mins $Therapeutic Activity: 8-22 mins                    Melanee Cordial E, PT Acute Rehabilitation Services Pager [phone]  Office [phone]   Roxine Caddy D Elonda Husky 08/29/2020, 3:46 PM

## 2020-08-29 NOTE — Care Management Important Message (Signed)
Important Message  Patient Details  Name: [name] MRN: [id number] Date of Birth: 1934/04/06   Medicare Important Message Given:  Yes     [name] 08/29/2020, 1:09 PM

## 2020-08-29 NOTE — TOC Progression Note (Signed)
Transition of Care [facility]) - Progression Note    Patient Details  Name: [name] MRN: [id number] Date of Birth: [birth year]  Transition of Care [facility]) CM/SW [place], [place] Phone Number: 08/29/2020, 4:19 PM  Clinical Narrative:   CSW alerted by Rehab Admissions of appeal process, but that appeal being overturned is not likely. CSW asked about bed at [facility], and the bed is no longer available for patient. CSW met with son at bedside to discuss other options. Son indicated family preference for either [facility] or [facility]. [facility] has a bed available, but [facility] does not. CSW also spoke with [employer] about auth expiring today; auth was extended to tomorrow, but will expire tomorrow so patient will need to transition to SNF tomorrow. CSW updated son at bedside, and family is agreeable to transfer to [facility] tomorrow. CSW to follow.    Expected Discharge Plan: [place] Barriers to Discharge: Barriers Resolved  Expected Discharge Plan and Services Expected Discharge Plan: [place] In-house Referral: Clinical Social Work   Post Acute Care Choice: [place] Living arrangements for the past 2 months: Single Family Home                                       Social Determinants of Health (SDOH) Interventions    Readmission Risk Interventions No flowsheet data found.

## 2020-08-29 NOTE — Progress Notes (Signed)
PROGRESS NOTE    [name]  BDZ:[id number] DOB: [birth year] DOA: 08/19/2020 PCP: Hulan Fess, MD     Brief Narrative:  [name] is an 86 y.o. male with medical history significant of coronary artery disease status post CABG, hypertension, hyperlipidemia, type 2 diabetes mellitus, CKD stage IIIb, hypothyroidism, depression/anxiety, memory difficulty, chronic bilateral leg swelling, AAA status post repair, who presents to emergency department with recurrent falls and left-sided weakness started 2 days ago.  Rash on left arm which he noticed yesterday. Patient reports left-sided weakness which started on Thursday evening.  He typically ambulates with cane at baseline and fell 3 times on Thursday and 2 times on Friday.  Reports difficulty with ambulation due to his left leg giving out.  CT head shows small focus of ill-defined hypoattenuation within the right thalamus capsular junction which is new as compared to the prior head CT and likely reflects an acute/early subacute lacunar infarct.  Neurology was consulted and patient admitted for stroke work-up.  Subjective: No complaints offered today.  Left-sided weakness is about the same.  Assessment & Plan:   Principal Problem:   Ischemic stroke [facility]) Active Problems:   Coronary artery disease   Essential hypertension   Hyperlipidemia   DM (diabetes mellitus) (HCC)   Chronic kidney disease, stage IV (severe) (HCC)   Thrombocytopenia (HCC)   Hypothyroidism   Dementia (HCC)   Depression   Cellulitis   Acute right thalamus and internal capsule CVA -CT head: Small focus of ill-defined hypoattenuation within the right thalamocapsular junction which is new as compared to the prior head CT of 03/18/2020 and likely reflects an acute/early subacute lacunar infarct given the provided history. -MRI brain: Acute/subacute 13 mm infarct involving the lateral right thalamus and internal capsule with small focus of central petechial  hemorrhage. -Echo: No intracardiac source of embolism detected on this transthoracic study -Carotid duplex US: 1-39% stenosis bilaterally   LDL of 96.  HbA1c 10.6. -Continue aspirin and Plavix for 3 weeks, followed by Plavix alone.  Aspirin started on 9/14 and to stop on 09/11/20. -Lipitor -Follow-up with neurology in 6 weeks Patient seen by PT and OT.  Initially skilled nursing facility was recommended.  Family wanted to pursue inpatient rehabilitation.  CIR denied by insurance.  Family has appealed.  Waiting on decision. Patient remains stable from neurological standpoint.  Left elbow cellulitis Improved.  He has completed 5-day course of cephalexin.  Patient mentions that he continues to have loose stools although not corroborated by nursing staff.  Continue to monitor.  Essential hypertension Initially permissive hypertension was allowed.  Subsequently metoprolol was resumed.  Due to persistently elevated blood pressures furosemide was also resumed.  However due to rising creatinine it had to be held yesterday.  May consider resuming at a lower dose from tomorrow. Blood pressure stable for the most part.  High readings noted occasionally.    Diabetes mellitus type 2, uncontrolled with hyperglycemia Hemoglobin A1c 10.6.  Hypoglycemic episodes noted on 9/15.  Lantus dose was decreased.  CBGs are stable for the most part.  Continue current dose of Lantus.   Hyperlipidemia Continue Lipitor.  LDL 96.  Acute on CKD stage IV/hypokalemia Baseline creatinine 2.6.  Serum creatinine did climb up to 2.92 yesterday with a rise in BUN as well.  Likely due to furosemide.  It was held yesterday.  Creatinine is down to 2.4 today with BUN down to 45.  Recheck labs again tomorrow.  Consider reinitiating furosemide at a once a  day dose instead of twice a day dose.  It is likely that we may need to tolerate a higher level of creatinine in this individual.  Hypothyroidism Continue Synthroid  Coronary  artery disease status post CABG Stable.  Continue beta-blocker and platelet agents and statin.  Depression, anxiety Continue Zoloft, Cymbalta  Dementia Continue Aricept   DVT prophylaxis: Lovenox Code Status: Full code Family Communication: No family at bedside Disposition Plan: CIR  Status is: Inpatient  Remains inpatient appropriate because:Unsafe d/c plan   Dispo: The patient is from: Home              Anticipated d/c is to: CIR              Anticipated d/c date is: Depending on insurance authorization and bed availability              Patient currently is medically stable to d/c.     Consultants:   Neurology  Procedures:   None  Antimicrobials:  Anti-infectives (From admission, onward)   Start     Dose/Rate Route Frequency Ordered Stop   08/21/20 1400  cephALEXin (KEFLEX) capsule 500 mg        500 mg Oral Every 6 hours 08/21/20 1355 08/26/20 1159   08/20/20 1115  cefTRIAXone (ROCEPHIN) 1 g in sodium chloride 0.9 % 100 mL IVPB  Status:  Discontinued        1 g 200 mL/hr over 30 Minutes Intravenous Every 24 hours 08/20/20 1102 08/21/20 1355   08/20/20 1000  cefTRIAXone (ROCEPHIN) 1 g in sodium chloride 0.9 % 100 mL IVPB  Status:  Discontinued        1 g 200 mL/hr over 30 Minutes Intravenous Every 24 hours 08/20/20 0950 08/20/20 0951   08/20/20 0800  ceFAZolin (ANCEF) IVPB 1 g/50 mL premix        1 g 100 mL/hr over 30 Minutes Intravenous  Once 08/20/20 0757 08/20/20 1224       Objective: Vitals:   08/28/20 1917 08/29/20 0001 08/29/20 0416 08/29/20 0729  BP: (!) 179/74 (!) 170/75 (!) 146/78 (!) 173/80  Pulse: 64 63 (!) 56 (!) 56  Resp: 17 15 17 20   Temp: 98.8 F (37.1 C) 98.3 F (36.8 C) 98 F (36.7 C) (!) 97.5 F (36.4 C)  TempSrc: Oral Oral Oral Oral  SpO2: 98% 95% 93% 97%  Weight:      Height:        Intake/Output Summary (Last 24 hours) at 08/29/2020 1006 Last data filed at 08/29/2020 0914 Gross per 24 hour  Intake 360 ml  Output 1800 ml   Net -1440 ml   Filed Weights   08/21/20 1841  Weight: 87.7 kg    Examination:  General appearance: Awake alert.  In no distress Resp: Clear to auscultation bilaterally.  Normal effort Cardio: S1-S2 is normal regular.  No S3-S4.  No rubs murmurs or bruit GI: Abdomen is soft.  Nontender nondistended.  Bowel sounds are present normal.  No masses organomegaly Left-sided weakness is stable.    Data Reviewed: I have personally reviewed following labs and imaging studies  CBC: No results for input(s): WBC, NEUTROABS, HGB, HCT, MCV, PLT in the last 168 hours. Basic Metabolic Panel: Recent Labs  Lab 08/23/20 0215 08/24/20 0855 08/26/20 0210 08/28/20 0146 08/29/20 0431  NA 141 142 140 141 141  K 3.3* 3.8 3.8 3.6 3.6  CL 108 110 107 104 108  CO2 25 24 24 27 22   GLUCOSE 108*  166* 131* 138* 105*  BUN 34* 32* 35* 50* 45*  CREATININE 2.35* 2.08* 2.29* 2.92* 2.41*  CALCIUM 9.1 9.6 9.6 9.3 9.5   GFR: Estimated Creatinine Clearance: 25.5 mL/min (A) (by C-G formula based on SCr of 2.41 mg/dL (H)).  CBG: Recent Labs  Lab 08/28/20 0852 08/28/20 1138 08/28/20 1639 08/28/20 2106 08/29/20 0607  GLUCAP 146* 122* 161* 111* 109*    Recent Results (from the past 240 hour(s))  SARS Coronavirus 2 by RT PCR (hospital order, performed in [facility] hospital lab) Nasopharyngeal Nasopharyngeal Swab     Status: None   Collection Time: 08/20/20 12:21 PM   Specimen: Nasopharyngeal Swab  Result Value Ref Range Status   SARS Coronavirus 2 NEGATIVE NEGATIVE Final    Comment: (NOTE) SARS-CoV-2 target nucleic acids are NOT DETECTED.  The SARS-CoV-2 RNA is generally detectable in upper and lower respiratory specimens during the acute phase of infection. The lowest concentration of SARS-CoV-2 viral copies this assay can detect is 250 copies / mL. A negative result does not preclude SARS-CoV-2 infection and should not be used as the sole basis for treatment or other patient management  decisions.  A negative result may occur with improper specimen collection / handling, submission of specimen other than nasopharyngeal swab, presence of viral mutation(s) within the areas targeted by this assay, and inadequate number of viral copies (<250 copies / mL). A negative result must be combined with clinical observations, patient history, and epidemiological information.  Fact Sheet for Patients:   StrictlyIdeas.no  Fact Sheet for Healthcare Providers: BankingDealers.co.za  This test is not yet approved or  cleared by the [place] FDA and has been authorized for detection and/or diagnosis of SARS-CoV-2 by FDA under an Emergency Use Authorization (EUA).  This EUA will remain in effect (meaning this test can be used) for the duration of the COVID-19 declaration under Section 564(b)(1) of the Act, 21 U.S.C. section 360bbb-3(b)(1), unless the authorization is terminated or revoked sooner.  Performed at [facility], [place] [address] [place], [place] [address]   SARS Coronavirus 2 by RT PCR (hospital order, performed in [facility] hospital lab) Nasopharyngeal Nasopharyngeal Swab     Status: None   Collection Time: 08/24/20 11:18 AM   Specimen: Nasopharyngeal Swab  Result Value Ref Range Status   SARS Coronavirus 2 NEGATIVE NEGATIVE Final    Comment: (NOTE) SARS-CoV-2 target nucleic acids are NOT DETECTED.  The SARS-CoV-2 RNA is generally detectable in upper and lower respiratory specimens during the acute phase of infection. The lowest concentration of SARS-CoV-2 viral copies this assay can detect is 250 copies / mL. A negative result does not preclude SARS-CoV-2 infection and should not be used as the sole basis for treatment or other patient management decisions.  A negative result may occur with improper specimen collection / handling, submission of specimen other than nasopharyngeal swab, presence of viral  mutation(s) within the areas targeted by this assay, and inadequate number of viral copies (<250 copies / mL). A negative result must be combined with clinical observations, patient history, and epidemiological information.  Fact Sheet for Patients:   StrictlyIdeas.no  Fact Sheet for Healthcare Providers: BankingDealers.co.za  This test is not yet approved or  cleared by the [place] FDA and has been authorized for detection and/or diagnosis of SARS-CoV-2 by FDA under an Emergency Use Authorization (EUA).  This EUA will remain in effect (meaning this test can be used) for the duration of the COVID-19 declaration under Section  564(b)(1) of the Act, 21 U.S.C. section 360bbb-3(b)(1), unless the authorization is terminated or revoked sooner.  Performed at [facility], [place] [address] [place], [place] [address]       Radiology Studies: No results found.    Scheduled Meds: . aspirin EC  81 mg Oral Daily  . atorvastatin  40 mg Oral QPM  . calcitRIOL  0.25 mcg Oral Daily  . clopidogrel  75 mg Oral Daily  . donepezil  10 mg Oral QHS  . DULoxetine  30 mg Oral QHS  . enoxaparin (LOVENOX) injection  30 mg Subcutaneous Daily  . insulin aspart  0-15 Units Subcutaneous TID WC  . insulin aspart  0-5 Units Subcutaneous QHS  . insulin glargine  25 Units Subcutaneous Daily  . levothyroxine  75 mcg Oral QAC breakfast  . metoprolol succinate  25 mg Oral Daily  . saccharomyces boulardii  250 mg Oral BID  . sertraline  50 mg Oral Daily  . sodium chloride flush  3 mL Intravenous Once   Continuous Infusions:    LOS: 9 days      Bonnielee Haff, [facility] 08/29/2020, 10:06 AM

## 2020-08-30 DIAGNOSIS — E785 Hyperlipidemia, unspecified: Secondary | ICD-10-CM | POA: Diagnosis not present

## 2020-08-30 DIAGNOSIS — F29 Unspecified psychosis not due to a substance or known physiological condition: Secondary | ICD-10-CM | POA: Diagnosis not present

## 2020-08-30 DIAGNOSIS — R627 Adult failure to thrive: Secondary | ICD-10-CM | POA: Diagnosis not present

## 2020-08-30 DIAGNOSIS — R1110 Vomiting, unspecified: Secondary | ICD-10-CM | POA: Diagnosis present

## 2020-08-30 DIAGNOSIS — N184 Chronic kidney disease, stage 4 (severe): Secondary | ICD-10-CM | POA: Diagnosis not present

## 2020-08-30 DIAGNOSIS — E782 Mixed hyperlipidemia: Secondary | ICD-10-CM | POA: Diagnosis not present

## 2020-08-30 DIAGNOSIS — Z23 Encounter for immunization: Secondary | ICD-10-CM | POA: Diagnosis not present

## 2020-08-30 DIAGNOSIS — N183 Chronic kidney disease, stage 3 unspecified: Secondary | ICD-10-CM | POA: Diagnosis not present

## 2020-08-30 DIAGNOSIS — E039 Hypothyroidism, unspecified: Secondary | ICD-10-CM | POA: Diagnosis not present

## 2020-08-30 DIAGNOSIS — E1165 Type 2 diabetes mellitus with hyperglycemia: Secondary | ICD-10-CM | POA: Diagnosis not present

## 2020-08-30 DIAGNOSIS — F329 Major depressive disorder, single episode, unspecified: Secondary | ICD-10-CM | POA: Diagnosis not present

## 2020-08-30 DIAGNOSIS — K429 Umbilical hernia without obstruction or gangrene: Secondary | ICD-10-CM | POA: Diagnosis not present

## 2020-08-30 DIAGNOSIS — K219 Gastro-esophageal reflux disease without esophagitis: Secondary | ICD-10-CM | POA: Diagnosis not present

## 2020-08-30 DIAGNOSIS — I69318 Other symptoms and signs involving cognitive functions following cerebral infarction: Secondary | ICD-10-CM | POA: Diagnosis not present

## 2020-08-30 DIAGNOSIS — K5909 Other constipation: Secondary | ICD-10-CM | POA: Diagnosis not present

## 2020-08-30 DIAGNOSIS — R197 Diarrhea, unspecified: Secondary | ICD-10-CM | POA: Diagnosis not present

## 2020-08-30 DIAGNOSIS — E1122 Type 2 diabetes mellitus with diabetic chronic kidney disease: Secondary | ICD-10-CM | POA: Diagnosis not present

## 2020-08-30 DIAGNOSIS — I10 Essential (primary) hypertension: Secondary | ICD-10-CM | POA: Diagnosis not present

## 2020-08-30 DIAGNOSIS — R52 Pain, unspecified: Secondary | ICD-10-CM | POA: Diagnosis not present

## 2020-08-30 DIAGNOSIS — R600 Localized edema: Secondary | ICD-10-CM | POA: Diagnosis not present

## 2020-08-30 DIAGNOSIS — K5900 Constipation, unspecified: Secondary | ICD-10-CM | POA: Diagnosis not present

## 2020-08-30 DIAGNOSIS — M6281 Muscle weakness (generalized): Secondary | ICD-10-CM | POA: Diagnosis not present

## 2020-08-30 DIAGNOSIS — E1140 Type 2 diabetes mellitus with diabetic neuropathy, unspecified: Secondary | ICD-10-CM | POA: Diagnosis not present

## 2020-08-30 DIAGNOSIS — I700 Atherosclerosis of aorta: Secondary | ICD-10-CM | POA: Diagnosis not present

## 2020-08-30 DIAGNOSIS — R296 Repeated falls: Secondary | ICD-10-CM | POA: Diagnosis not present

## 2020-08-30 DIAGNOSIS — R110 Nausea: Secondary | ICD-10-CM | POA: Diagnosis not present

## 2020-08-30 DIAGNOSIS — Z87891 Personal history of nicotine dependence: Secondary | ICD-10-CM | POA: Diagnosis not present

## 2020-08-30 DIAGNOSIS — I25119 Atherosclerotic heart disease of native coronary artery with unspecified angina pectoris: Secondary | ICD-10-CM | POA: Diagnosis not present

## 2020-08-30 DIAGNOSIS — E876 Hypokalemia: Secondary | ICD-10-CM | POA: Diagnosis not present

## 2020-08-30 DIAGNOSIS — F419 Anxiety disorder, unspecified: Secondary | ICD-10-CM | POA: Diagnosis not present

## 2020-08-30 DIAGNOSIS — I2510 Atherosclerotic heart disease of native coronary artery without angina pectoris: Secondary | ICD-10-CM | POA: Diagnosis not present

## 2020-08-30 DIAGNOSIS — R41841 Cognitive communication deficit: Secondary | ICD-10-CM | POA: Diagnosis not present

## 2020-08-30 DIAGNOSIS — N1832 Chronic kidney disease, stage 3b: Secondary | ICD-10-CM | POA: Diagnosis not present

## 2020-08-30 DIAGNOSIS — F0390 Unspecified dementia without behavioral disturbance: Secondary | ICD-10-CM | POA: Diagnosis not present

## 2020-08-30 DIAGNOSIS — K4090 Unilateral inguinal hernia, without obstruction or gangrene, not specified as recurrent: Secondary | ICD-10-CM | POA: Diagnosis not present

## 2020-08-30 DIAGNOSIS — R509 Fever, unspecified: Secondary | ICD-10-CM | POA: Diagnosis not present

## 2020-08-30 DIAGNOSIS — I6930 Unspecified sequelae of cerebral infarction: Secondary | ICD-10-CM | POA: Diagnosis not present

## 2020-08-30 DIAGNOSIS — I4891 Unspecified atrial fibrillation: Secondary | ICD-10-CM | POA: Diagnosis not present

## 2020-08-30 DIAGNOSIS — I639 Cerebral infarction, unspecified: Secondary | ICD-10-CM | POA: Diagnosis not present

## 2020-08-30 DIAGNOSIS — D582 Other hemoglobinopathies: Secondary | ICD-10-CM | POA: Diagnosis not present

## 2020-08-30 DIAGNOSIS — N189 Chronic kidney disease, unspecified: Secondary | ICD-10-CM | POA: Diagnosis not present

## 2020-08-30 DIAGNOSIS — I1310 Hypertensive heart and chronic kidney disease without heart failure, with stage 1 through stage 4 chronic kidney disease, or unspecified chronic kidney disease: Secondary | ICD-10-CM | POA: Diagnosis not present

## 2020-08-30 DIAGNOSIS — G894 Chronic pain syndrome: Secondary | ICD-10-CM | POA: Diagnosis not present

## 2020-08-30 DIAGNOSIS — E119 Type 2 diabetes mellitus without complications: Secondary | ICD-10-CM | POA: Diagnosis not present

## 2020-08-30 DIAGNOSIS — Z7401 Bed confinement status: Secondary | ICD-10-CM | POA: Diagnosis not present

## 2020-08-30 DIAGNOSIS — K5750 Diverticulosis of both small and large intestine without perforation or abscess without bleeding: Secondary | ICD-10-CM | POA: Diagnosis not present

## 2020-08-30 DIAGNOSIS — D500 Iron deficiency anemia secondary to blood loss (chronic): Secondary | ICD-10-CM | POA: Diagnosis not present

## 2020-08-30 DIAGNOSIS — R4182 Altered mental status, unspecified: Secondary | ICD-10-CM | POA: Diagnosis not present

## 2020-08-30 DIAGNOSIS — R1111 Vomiting without nausea: Secondary | ICD-10-CM | POA: Diagnosis not present

## 2020-08-30 DIAGNOSIS — I25810 Atherosclerosis of coronary artery bypass graft(s) without angina pectoris: Secondary | ICD-10-CM | POA: Diagnosis not present

## 2020-08-30 DIAGNOSIS — Z794 Long term (current) use of insulin: Secondary | ICD-10-CM | POA: Diagnosis not present

## 2020-08-30 DIAGNOSIS — I69991 Dysphagia following unspecified cerebrovascular disease: Secondary | ICD-10-CM | POA: Diagnosis not present

## 2020-08-30 DIAGNOSIS — D696 Thrombocytopenia, unspecified: Secondary | ICD-10-CM | POA: Diagnosis not present

## 2020-08-30 DIAGNOSIS — R112 Nausea with vomiting, unspecified: Secondary | ICD-10-CM | POA: Diagnosis not present

## 2020-08-30 DIAGNOSIS — Z79899 Other long term (current) drug therapy: Secondary | ICD-10-CM | POA: Diagnosis not present

## 2020-08-30 DIAGNOSIS — M1610 Unilateral primary osteoarthritis, unspecified hip: Secondary | ICD-10-CM | POA: Diagnosis not present

## 2020-08-30 DIAGNOSIS — R531 Weakness: Secondary | ICD-10-CM | POA: Diagnosis not present

## 2020-08-30 DIAGNOSIS — M2550 Pain in unspecified joint: Secondary | ICD-10-CM | POA: Diagnosis not present

## 2020-08-30 LAB — BASIC METABOLIC PANEL
Anion gap: 11 (ref 5–15)
BUN: 53 mg/dL — ABNORMAL HIGH (ref 8–23)
CO2: 26 mmol/L (ref 22–32)
Calcium: 9.6 mg/dL (ref 8.9–10.3)
Chloride: 102 mmol/L (ref 98–111)
Creatinine, Ser: 2.56 mg/dL — ABNORMAL HIGH (ref 0.61–1.24)
GFR calc Af Amer: 26 mL/min — ABNORMAL LOW (ref >60)
GFR calc non Af Amer: 22 mL/min — ABNORMAL LOW (ref >60)
Glucose, Bld: 87 mg/dL (ref 70–99)
Potassium: 3.9 mmol/L (ref 3.5–5.1)
Sodium: 139 mmol/L (ref 135–145)

## 2020-08-30 LAB — CBC
HCT: 49.4 % (ref 39.0–52.0)
Hemoglobin: 16 g/dL (ref 13.0–17.0)
MCH: 29.7 pg (ref 26.0–34.0)
MCHC: 32.4 g/dL (ref 30.0–36.0)
MCV: 91.7 fL (ref 80.0–100.0)
Platelets: 183 10*3/uL (ref 150–400)
RBC: 5.39 MIL/uL (ref 4.22–5.81)
RDW: 14.7 % (ref 11.5–15.5)
WBC: 10.3 10*3/uL (ref 4.0–10.5)
nRBC: 0 % (ref 0.0–0.2)

## 2020-08-30 LAB — GLUCOSE, CAPILLARY
Glucose-Capillary: 106 mg/dL — ABNORMAL HIGH (ref 70–99)
Glucose-Capillary: 166 mg/dL — ABNORMAL HIGH (ref 70–99)

## 2020-08-30 MED ORDER — POTASSIUM CHLORIDE ER 10 MEQ PO TBCR: 10.0000 meq | EXTENDED_RELEASE_TABLET | Freq: Every day | ORAL | 3 refills | Status: AC

## 2020-08-30 MED ORDER — FUROSEMIDE 80 MG PO TABS: 40.0000 mg | ORAL_TABLET | Freq: Every day | ORAL | Status: DC

## 2020-08-30 MED ORDER — LANTUS SOLOSTAR 100 UNIT/ML ~~LOC~~ SOPN: 25.0000 [IU] | PEN_INJECTOR | Freq: Every day | SUBCUTANEOUS | 11 refills | Status: AC

## 2020-08-30 NOTE — Progress Notes (Signed)
NURSING PROGRESS NOTE  [name] [id number] Discharge Data: 08/30/2020 3:38 PM Attending Provider: Bonnielee Haff, MD [id number], Lennette Bihari, MD     Renaye Rakers to be D/C'd to skilled nursing facility per MD order.  Discussed with the patient's Nurse at [facility] the After Visit Summary and all questions fully answered. All IV's discontinued with no bleeding noted. All belongings returned to patient for patient to take to the new facility.   Last Vital Signs:  Blood pressure (!) 144/70, pulse (!) 54, temperature 98.1 F (36.7 C), temperature source Oral, resp. rate 18, height 5\' 10"  (1.778 m), weight 87.7 kg, SpO2 96 %.  Discharge Medication List Allergies as of 08/30/2020   No Known Allergies     Medication List    TAKE these medications   acetaminophen 325 MG tablet Commonly known as: TYLENOL Take 650 mg by mouth every 6 (six) hours as needed for mild pain, fever or headache.   aspirin EC 81 MG tablet Take 1 tablet (81 mg total) by mouth daily for 21 days. FOR ONLY 3 WEEKS What changed: additional instructions   atorvastatin 40 MG tablet Commonly known as: LIPITOR Take 40 mg by mouth every evening.   calcitRIOL 0.25 MCG capsule Commonly known as: ROCALTROL Take 0.25 mcg by mouth in the morning and at bedtime.   clopidogrel 75 MG tablet Commonly known as: PLAVIX Take 1 tablet (75 mg total) by mouth daily.   donepezil 10 MG tablet Commonly known as: ARICEPT TAKE 1 TABLET BY MOUTH ONCE DAILY AT BEDTIME   DULoxetine 30 MG capsule Commonly known as: Cymbalta Take 1 capsule (30 mg total) by mouth at bedtime.   furosemide 80 MG tablet Commonly known as: LASIX Take 0.5 tablets (40 mg total) by mouth daily. What changed:   how much to take  when to take this   insulin aspart 100 UNIT/ML injection Commonly known as: novoLOG Inject 0-15 Units into the skin 3 (three) times daily before meals. Sliding scale   ketoconazole 2 % cream Commonly known as:  NIZORAL Apply 1 application topically daily.   Lantus SoloStar 100 UNIT/ML Solostar Pen Generic drug: insulin glargine Inject 25 Units into the skin daily. What changed:   how much to take  when to take this   levothyroxine 75 MCG tablet Commonly known as: SYNTHROID Take 75 mcg by mouth daily before breakfast.   loratadine 10 MG tablet Commonly known as: CLARITIN Take 10 mg by mouth daily as needed for allergies.   metoprolol succinate 25 MG 24 hr tablet Commonly known as: TOPROL-XL Take 1 tablet (25 mg total) by mouth daily. Please keep upcoming appt in April with Dr. Tamala Julian before anymore refills. Thank you   MULTIVITAMIN ADULTS PO Take 1 tablet by mouth daily.   niacin 1000 MG CR tablet Commonly known as: NIASPAN Take 1,000 mg by mouth 2 (two) times daily.   OneTouch Verio test strip Generic drug: glucose blood   potassium chloride 10 MEQ tablet Commonly known as: KLOR-CON Take 1 tablet (10 mEq total) by mouth daily. What changed: when to take this   RELION INSULIN SYRINGE 1ML/31G 31G X 5/16" 1 ML Misc Generic drug: Insulin Syringe-Needle U-100 USE AS DIRECTED FIVE TIMES DAILY   saccharomyces boulardii 250 MG capsule Commonly known as: FLORASTOR Take 1 capsule (250 mg total) by mouth 2 (two) times daily.   sertraline 50 MG tablet Commonly known as: ZOLOFT Take 50 mg by mouth daily.   valACYclovir 500  MG tablet Commonly known as: VALTREX Take 500 mg by mouth daily as needed (out break).

## 2020-08-30 NOTE — Discharge Summary (Signed)
[facility]  Physician Discharge Summary   Patient ID: [name] MRN: [id number] DOB/AGE: 09-05-1934 86 y.o.  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 Please check CBC and basic metabolic panel on Friday. 2. May need to adjust Lasix or stop if renal function worsens. 3. Ambulatory referral to neurology for follow-up in 4 to 6 weeks.    Home Health: Going to SNF Equipment/Devices: None at this time  CODE STATUS: Full code  DISCHARGE CONDITION: fair  Diet recommendation: Heart healthy  INITIAL HISTORY: [name] is an 86 y.o.malewith medical history significant ofcoronary artery disease status post CABG, hypertension, hyperlipidemia, type 2 diabetes mellitus, CKD stage IIIb, hypothyroidism, depression/anxiety, memory difficulty, chronic bilateral leg swelling, AAA status post repair, who presents to emergency department with recurrent falls and left-sided weakness started 2 days ago. Rash on left arm which he noticed yesterday. Patient reports left-sided weakness which started on Thursday evening. He typically ambulates with cane at baseline and fell 3 times on Thursday and 2 times on Friday. Reports difficulty with ambulation due to his left leg giving out. CT head shows small focus of ill-defined hypoattenuation within the right thalamus capsular junction which is new as compared to the prior head CT and likely reflects an acute/early subacute lacunar infarct.  Neurology was consulted and patient admitted for stroke  work-up.  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 -MRI brain: Acute/subacute 13 mm infarct involving the lateral right thalamus and internal capsule with small focus of central petechial hemorrhage. -Echo: No intracardiac source of embolism detected on this transthoracic study -Carotid duplex US: 1-39% stenosis bilaterally   LDL of 96.  HbA1c 10.6. -Continue aspirin and Plavix for 3 weeks, followed by Plavix alone.   -Continue Lipitor -Follow-up with neurology in 6 weeks Patient seen by PT and OT.  Initially skilled nursing facility was recommended.  Family wanted to pursue inpatient rehabilitation.  CIR denied by insurance.  Family appealed which was also denied.  Patient to now go to skilled nursing facility.  Left elbow cellulitis Improved.  He has completed 5-day course of cephalexin.  Patient mentions that he continues to have loose stools although not corroborated by nursing staff.  Continue to monitor.  Can use Imodium as needed.  Continue Florastor.  Essential hypertension Initially permissive hypertension was allowed.    Metoprolol was resumed.  Had to be started on amlodipine.  We will also resume furosemide at lower dose.    Diabetes mellitus type 2, uncontrolled with hyperglycemia Hemoglobin A1c 10.6.  Hypoglycemic episodes noted on 9/15.  Lantus dose was decreased.  CBGs are stable for the most part.  Continue current dose of Lantus.   Hyperlipidemia Continue Lipitor.  LDL 96.  Acute on CKD stage IV/hypokalemia Baseline creatinine 2.6.  Serum creatinine did climb up to 2.92 with a rise in BUN as well.  Likely due to furosemide.  It was held.  Renal function  has been stable  for the last 2 days.  We will plan on resuming furosemide at a much lower dose of 40 mg once a day.  Monitor renal function closely.  We will also give potassium daily.  Hypothyroidism Continue Synthroid  Coronary artery disease status post CABG Stable.  Continue beta-blocker and platelet agents and statin.  Depression, anxiety Continue Zoloft, Cymbalta  Dementia Continue Aricept   Overall patient has been stable for last several days.  Okay for discharge to skilled nursing facility for short-term rehab.   PERTINENT LABS:  The results of significant diagnostics from this hospitalization (including imaging, microbiology, ancillary and laboratory) are listed below for reference.    Microbiology: Recent Results (from the past 240 hour(s))  SARS Coronavirus 2 by RT PCR (hospital order, performed in [facility] hospital lab) Nasopharyngeal Nasopharyngeal Swab     Status: None   Collection Time: 08/20/20 12:21 PM   Specimen: Nasopharyngeal Swab  Result Value Ref Range Status   SARS Coronavirus 2 NEGATIVE NEGATIVE Final    Comment: (NOTE) SARS-CoV-2 target nucleic acids are NOT DETECTED.  The SARS-CoV-2 RNA is generally detectable in upper and lower respiratory specimens during the acute phase of infection. The lowest concentration of SARS-CoV-2 viral copies this assay can detect is 250 copies / mL. A negative result does not preclude SARS-CoV-2 infection and should not be used as the sole basis for treatment or other patient management decisions.  A negative result may occur with improper specimen collection / handling, submission of specimen other than nasopharyngeal swab, presence of viral mutation(s) within the areas targeted by this assay, and inadequate number of viral copies (<250 copies / mL). A negative result must be combined with clinical observations, patient history, and epidemiological information.  Fact Sheet for Patients:    StrictlyIdeas.no  Fact Sheet for Healthcare Providers: BankingDealers.co.za  This test is not yet approved or  cleared by the [place] FDA and has been authorized for detection and/or diagnosis of SARS-CoV-2 by FDA under an Emergency Use Authorization (EUA).  This EUA will remain in effect (meaning this test can be used) for the duration of the COVID-19 declaration under Section 564(b)(1) of the Act, 21 U.S.C. section 360bbb-3(b)(1), unless the authorization is terminated or revoked sooner.  Performed at [facility], [place] [address] [place], [place] [address]   SARS Coronavirus 2 by RT PCR (hospital order, performed in [facility] hospital lab) Nasopharyngeal Nasopharyngeal Swab     Status: None   Collection Time: 08/24/20 11:18 AM   Specimen: Nasopharyngeal Swab  Result Value Ref Range Status   SARS Coronavirus 2 NEGATIVE NEGATIVE Final    Comment: (NOTE) SARS-CoV-2 target nucleic acids are NOT DETECTED.  The SARS-CoV-2 RNA is generally detectable in upper and lower respiratory specimens during the acute phase of infection. The lowest concentration of SARS-CoV-2 viral copies this assay can detect is 250 copies / mL. A negative result does not preclude SARS-CoV-2 infection and should not be used as the sole basis for treatment or other patient management decisions.  A negative result may occur with improper specimen collection / handling, submission of specimen other than nasopharyngeal swab, presence of viral mutation(s) within the areas targeted by this assay, and inadequate number of viral copies (<250 copies / mL). A negative result must be combined with clinical observations, patient history, and epidemiological information.  Fact Sheet for Patients:   StrictlyIdeas.no  Fact Sheet for Healthcare Providers: BankingDealers.co.za  This test is not yet approved  or   cleared by the [place] and has been authorized for detection and/or diagnosis of SARS-CoV-2 by FDA under an Emergency Use Authorization (EUA).  This EUA will remain in effect (meaning this test can be used) for the duration of the COVID-19 declaration under Section 564(b)(1) of the Act, 21 U.S.C. section 360bbb-3(b)(1), unless the authorization is terminated or revoked sooner.  Performed at [facility], [place] [address], [place], [place] [address]   SARS Coronavirus 2 by RT PCR (hospital order, performed in [facility] hospital lab) Nasopharyngeal Nasopharyngeal Swab     Status: None   Collection Time: 08/29/20  3:07 PM   Specimen: Nasopharyngeal Swab  Result Value Ref Range Status   SARS Coronavirus 2 NEGATIVE NEGATIVE Final    Comment: (NOTE) SARS-CoV-2 target nucleic acids are NOT DETECTED.  The SARS-CoV-2 RNA is generally detectable in upper and lower respiratory specimens during the acute phase of infection. The lowest concentration of SARS-CoV-2 viral copies this assay can detect is 250 copies / mL. A negative result does not preclude SARS-CoV-2 infection and should not be used as the sole basis for treatment or other patient management decisions.  A negative result may occur with improper specimen collection / handling, submission of specimen other than nasopharyngeal swab, presence of viral mutation(s) within the areas targeted by this assay, and inadequate number of viral copies (<250 copies / mL). A negative result must be combined with clinical observations, patient history, and epidemiological information.  Fact Sheet for Patients:   StrictlyIdeas.no  Fact Sheet for Healthcare Providers: BankingDealers.co.za  This test is not yet approved or  cleared by the [place] FDA and has been authorized for detection and/or diagnosis of SARS-CoV-2 by FDA under an Emergency Use Authorization (EUA).  This EUA  will remain in effect (meaning this test can be used) for the duration of the COVID-19 declaration under Section 564(b)(1) of the Act, 21 U.S.C. section 360bbb-3(b)(1), unless the authorization is terminated or revoked sooner.  Performed at [facility], [place] [address], [place], [place] [address]      Labs:    Basic Metabolic Panel: Recent Labs  Lab 08/24/20 0855 08/26/20 0210 08/28/20 0146 08/29/20 0431 08/30/20 0350  NA 142 140 141 141 139  K 3.8 3.8 3.6 3.6 3.9  CL 110 107 104 108 102  CO2 24 24 27 22 26   GLUCOSE 166* 131* 138* 105* 87  BUN 32* 35* 50* 45* 53*  CREATININE 2.08* 2.29* 2.92* 2.41* 2.56*  CALCIUM 9.6 9.6 9.3 9.5 9.6   CBC: Recent Labs  Lab 08/30/20 0350  WBC 10.3  HGB 16.0  HCT 49.4  MCV 91.7  PLT 183     CBG: Recent Labs  Lab 08/29/20 0607 08/29/20 1208 08/29/20 1620 08/29/20 2120 08/30/20 0615  GLUCAP 109* 174* 147* 125* 106*     IMAGING STUDIES CT Head Wo Contrast  Result Date: 08/19/2020 CLINICAL DATA:  Loss of coordination. Additional provided: Left-sided coordination problem yesterday, last known normal yesterday. EXAM: CT HEAD WITHOUT CONTRAST TECHNIQUE: Contiguous axial images were obtained from the base of the skull through the vertex without intravenous contrast. COMPARISON:  Head CT 03/18/2020. FINDINGS: Brain: Stable, mild generalized parenchymal atrophy. There is a small focus of ill-defined hypoattenuation within the right thalamocapsular junction which is new as compared to the prior head CT of 03/18/2020 and likely reflecting an acute/early subacute lacunar infarct given the provided history (series 3, image 17). Redemonstrated chronic lacunar infarcts within  the right basal ganglia and right internal capsule. Stable background mild ill-defined hypoattenuation within the cerebral white matter which is nonspecific, but consistent with chronic small vessel ischemic disease. There is no acute intracranial hemorrhage. No  demarcated cortical infarct is identified. No extra-axial fluid collection. No evidence of intracranial mass. No midline shift. Vascular: No hyperdense vessel.  Atherosclerotic calcifications. Skull: Normal. Negative for fracture or focal lesion. Sinuses/Orbits: Visualized orbits show no acute finding. Small amount of frothy secretions within a posterior left ethmoid air cell. Minimal ethmoid and left maxillary sinus mucosal thickening at the imaged levels. Small left mastoid effusion. IMPRESSION: Small focus of ill-defined hypoattenuation within the right thalamocapsular junction which is new as compared to the prior head CT of 03/18/2020 and likely reflects an acute/early subacute lacunar infarct given the provided history. Stable background mild generalized parenchymal atrophy and chronic small vessel ischemic disease. Redemonstrated chronic lacunar infarcts within the right basal ganglia and right internal capsule. Right ethmoid sinusitis. Background mild scattered paranasal sinus mucosal thickening. Small left mastoid effusion. Electronically Signed   By: Kellie Simmering DO   On: 08/19/2020 14:55   MR ANGIO HEAD WO CONTRAST  Result Date: 08/20/2020 CLINICAL DATA:  Stroke, follow-up.  Right thalamic infarct. EXAM: MRA HEAD WITHOUT CONTRAST TECHNIQUE: Angiographic images of the Circle of Willis were obtained using MRA technique without intravenous contrast. COMPARISON:  MR head without contrast 08/29/2020 and CT head without contrast 08/19/2020 FINDINGS: Internal carotid arteries are within normal limits from the high cervical segments through the ICA termini bilaterally. The A1 and M1 segments are normal. MCA bifurcations are intact. ACA and MCA branch vessels are within normal limits. 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 The more distal basilar artery is normal. Both posterior cerebral arteries originate from basilar tip. There is some attenuation of  distal PCA branch vessels bilaterally. The more caudal basilar artery is diminutive. No flow is identified in the vertebral arteries on either side. IMPRESSION: 1. 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 2. No flow identified in the vertebral arteries. 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 CTA of the head and neck could be used for further evaluation if clinically indicated. Electronically Signed   By: San Morelle M.D.   On: 08/20/2020 12:37   MR BRAIN WO CONTRAST  Result Date: 08/20/2020 CLINICAL DATA:  Stroke, follow-up. Acute onset of left-sided weakness and abnormal gait. Dysarthria. EXAM: MRI HEAD WITHOUT CONTRAST TECHNIQUE: Multiplanar, multiecho pulse sequences of the brain and surrounding structures were obtained without intravenous contrast. COMPARISON:  CT head without contrast 08/19/2020 FINDINGS: Brain: Acute/subacute infarct in the lateral right thalamus and internal capsule is confirmed, measuring 13 mm. No other acute infarct is present. T2 signal changes are associated with the acute/subacute infarct. Susceptibility centrally suggests some petechial hemorrhage within the infarct. Remote lacunar infarcts of the right globus pallidus and caudate are stable. Dilated perivascular spaces are present throughout the basal ganglia. Periventricular white matter changes are stable. Ventricles are proportionate to the degree of atrophy. No significant extraaxial fluid collection is present. The internal auditory canals are within normal limits. The brainstem and cerebellum are within normal limits. Remote blood products are present in the left occipital white matter, new from the prior exam. Vascular: Flow is present in the major intracranial arteries. Skull and upper cervical spine: The craniocervical junction is normal. Upper cervical spine is within  normal limits. Marrow signal is  unremarkable. Sinuses/Orbits: Left mastoid effusion is present. No obstructing nasopharyngeal lesion is present. The paranasal sinuses and mastoid air cells are otherwise clear. The globes and orbits are within normal limits. IMPRESSION: 1. Acute/subacute 13 mm infarct involving the lateral right thalamus and internal capsule with small focus of central petechial hemorrhage. 2. Remote lacunar infarcts of the right globus pallidus and caudate. 3. Atrophy and white matter disease is stable. This likely reflects the sequela of chronic microvascular ischemia. 4. Left mastoid effusion. No obstructing nasopharyngeal lesion is present. These results were called by telephone at the time of interpretation on 08/20/2020 at 10:15 am to provider Dr. Ashok Cordia, who verbally acknowledged these results. Electronically Signed   By: San Morelle M.D.   On: 08/20/2020 10:21   ECHOCARDIOGRAM COMPLETE  Result Date: 08/20/2020    ECHOCARDIOGRAM REPORT   Patient Name:   CAMBELL STANEK Date of Exam: 08/20/2020 Medical Rec #:  [id number]    Height:       70.0 in Accession #:    [id number]   Weight:       221.0 lb Date of Birth:  January 12, 1934    BSA:          2.178 m Patient Age:    48 years     BP:           145/72 mmHg Patient Gender: M            HR:           62 bpm. Exam Location:  Inpatient Procedure: 2D Echo Indications:    Stroke 434.91 / I163.9  History:        Patient has no prior history of Echocardiogram examinations.                 CAD; Risk Factors:Hypertension, Diabetes, Dyslipidemia and                 Former Smoker. Abdominal aortic aneurysm.  Sonographer:    Leavy Cella Referring Phys: [id number] [place]  1. Left ventricular ejection fraction, by estimation, is 65 to 70%. The left ventricle has normal function. The left ventricle has no regional wall motion abnormalities. There is moderate concentric left ventricular hypertrophy. Left ventricular diastolic parameters are consistent with Grade I  diastolic dysfunction (impaired relaxation).  2. Right ventricular systolic function is normal. The right ventricular size is normal.  3. The mitral valve is normal in structure. Mild mitral valve regurgitation. No evidence of mitral stenosis.  4. The aortic valve is normal in structure. There is severe calcifcation of the aortic valve. There is severe thickening of the aortic valve. Aortic valve regurgitation is mild. Mild aortic valve stenosis.  5. The inferior vena cava is normal in size with greater than 50% respiratory variability, suggesting right atrial pressure of 3 mmHg. Conclusion(s)/Recommendation(s): No intracardiac source of embolism detected on this transthoracic study. A transesophageal echocardiogram is recommended to exclude cardiac source of embolism if clinically indicated. FINDINGS  Left Ventricle: Left ventricular ejection fraction, by estimation, is 65 to 70%. The left ventricle has normal function. The left ventricle has no regional wall motion abnormalities. The left ventricular internal cavity size was normal in size. There is  moderate concentric left ventricular hypertrophy. Left ventricular diastolic parameters are consistent with Grade I diastolic dysfunction (impaired relaxation). Normal left ventricular filling pressure. Right Ventricle: The right ventricular size is normal. No increase in right ventricular wall thickness. Right ventricular systolic  function is normal. Left Atrium: Left atrial size was normal in size. Right Atrium: Right atrial size was normal in size. Pericardium: There is no evidence of pericardial effusion. Mitral Valve: The mitral valve is normal in structure. Mild mitral valve regurgitation. No evidence of mitral valve stenosis. Tricuspid Valve: The tricuspid valve is normal in structure. Tricuspid valve regurgitation is mild . No evidence of tricuspid stenosis. Aortic Valve: The aortic valve is normal in structure. There is severe calcifcation of the aortic  valve. There is severe thickening of the aortic valve. Aortic valve regurgitation is mild. Aortic regurgitation PHT measures 647 msec. Mild aortic stenosis is present. Pulmonic Valve: The pulmonic valve was normal in structure. Pulmonic valve regurgitation is not visualized. No evidence of pulmonic stenosis. Aorta: The aortic root is normal in size and structure. Venous: The inferior vena cava is normal in size with greater than 50% respiratory variability, suggesting right atrial pressure of 3 mmHg. IAS/Shunts: No atrial level shunt detected by color flow Doppler.  LEFT VENTRICLE PLAX 2D LVIDd:         4.10 cm Diastology LVIDs:         2.90 cm LV e' medial:    4.79 cm/s LV PW:         1.30 cm LV E/e' medial:  8.9 LV IVS:        1.30 cm LV e' lateral:   9.68 cm/s                        LV E/e' lateral: 4.4  RIGHT VENTRICLE RV S prime:     12.40 cm/s TAPSE (M-mode): 1.4 cm LEFT ATRIUM             Index       RIGHT ATRIUM           Index LA diam:        3.60 cm 1.65 cm/m  RA Area:     12.40 cm LA Vol (A2C):   61.9 ml 28.42 ml/m RA Volume:   26.60 ml  12.21 ml/m LA Vol (A4C):   30.7 ml 14.10 ml/m LA Biplane Vol: 43.9 ml 20.16 ml/m  AORTIC VALVE AI PHT:      647 msec  AORTA Ao Root diam: 3.50 cm MITRAL VALVE MV Area (PHT): 2.39 cm MV Decel Time: 317 msec MV E velocity: 42.40 cm/s MV A velocity: 73.70 cm/s MV E/A ratio:  0.58 Ena Dawley MD Electronically signed by Ena Dawley MD Signature Date/Time: 08/20/2020/3:37:39 PM    Final    VAS US CAROTID  Result Date: 08/20/2020 Carotid Arterial Duplex Study Indications:       CVA. Risk Factors:      Hypertension, hyperlipidemia, Diabetes. Comparison Study:  No prior studies. Performing Technologist: Darlin Coco  Examination Guidelines: A complete evaluation includes B-mode imaging, spectral Doppler, color Doppler, and power Doppler as needed of all accessible portions of each vessel. Bilateral testing is considered an integral part of a complete  examination. Limited examinations for reoccurring indications may be performed as noted.  Right Carotid Findings: +----------+--------+--------+--------+------------------+------------------+           PSV cm/sEDV cm/sStenosisPlaque DescriptionComments           +----------+--------+--------+--------+------------------+------------------+ CCA Prox  60      12                                intimal  thickening +----------+--------+--------+--------+------------------+------------------+ CCA Distal40      10                                intimal thickening +----------+--------+--------+--------+------------------+------------------+ ICA Prox  42      8       1-39%   heterogenous                         +----------+--------+--------+--------+------------------+------------------+ ICA Distal50      9                                                    +----------+--------+--------+--------+------------------+------------------+ ECA       67                                                           +----------+--------+--------+--------+------------------+------------------+ +----------+--------+-------+----------------+-------------------+           PSV cm/sEDV cmsDescribe        Arm Pressure (mmHG) +----------+--------+-------+----------------+-------------------+ [id number]             Multiphasic, WNL                    +----------+--------+-------+----------------+-------------------+ +---------+--------+--------+--------------+ VertebralPSV cm/sEDV cm/sNot identified +---------+--------+--------+--------------+  Left Carotid Findings: +----------+--------+--------+--------+------------------+------------------+           PSV cm/sEDV cm/sStenosisPlaque DescriptionComments           +----------+--------+--------+--------+------------------+------------------+ CCA Prox  57      10                                intimal thickening  +----------+--------+--------+--------+------------------+------------------+ CCA Distal50      11                                intimal thickening +----------+--------+--------+--------+------------------+------------------+ ICA Prox  86      15      1-39%   heterogenous                         +----------+--------+--------+--------+------------------+------------------+ ICA Distal63      16                                                   +----------+--------+--------+--------+------------------+------------------+ ECA       67                                                           +----------+--------+--------+--------+------------------+------------------+ +----------+--------+--------+----------------+-------------------+           PSV cm/sEDV cm/sDescribe        Arm Pressure (mmHG) +----------+--------+--------+----------------+-------------------+ [id number]  Multiphasic, WNL                    +----------+--------+--------+----------------+-------------------+ +---------+--------+--+--------+--+---------+ VertebralPSV cm/s61EDV cm/s24Antegrade +---------+--------+--+--------+--+---------+   Summary: Right Carotid: Velocities in the right ICA are consistent with a 1-39% stenosis. Left Carotid: Velocities in the left ICA are consistent with a 1-39% stenosis. Vertebrals:  Left vertebral artery demonstrates antegrade flow. Right vertebral              artery was not visualized. Subclavians: Normal flow hemodynamics were seen in bilateral subclavian              arteries. *See table(s) above for measurements and observations.  Electronically signed by Monica Martinez MD on 08/20/2020 at 1:39:47 PM.    Final     DISCHARGE EXAMINATION: Vitals:   08/29/20 1953 08/29/20 2319 08/30/20 0342 08/30/20 0805  BP: 133/69 (!) 152/67 (!) 157/75 (!) 144/70  Pulse: (!) 56 (!) 53 61 (!) 54  Resp: 18 18 18 18   Temp: 98.7 F (37.1 C) 98.7 F (37.1 C)  98.5 F (36.9 C) 98.1 F (36.7 C)  TempSrc: Oral Oral Oral Oral  SpO2: 95% 94% 97% 96%  Weight:      Height:       General appearance: Awake alert.  In no distress Resp: Clear to auscultation bilaterally.  Normal effort Cardio: S1-S2 is normal regular.  No S3-S4.  No rubs murmurs or bruit GI: Abdomen is soft.  Nontender nondistended.  Bowel sounds are present normal.  No masses organomegaly Left-sided weakness is stable   DISPOSITION: SNF  Discharge Instructions    Ambulatory referral to Neurology   Complete by: As directed    An appointment is requested in approximately: 4-6 weeks for stroke follow-up   Call MD for:  difficulty breathing, headache or visual disturbances   Complete by: As directed    Call MD for:  extreme fatigue   Complete by: As directed    Call MD for:  persistant dizziness or light-headedness   Complete by: As directed    Call MD for:  persistant nausea and vomiting   Complete by: As directed    Call MD for:  severe uncontrolled pain   Complete by: As directed    Call MD for:  temperature >100.4   Complete by: As directed    Discharge instructions   Complete by: As directed    Please review instructions on the discharge summary.  You were cared for by a hospitalist during your hospital stay. If you have any questions about your discharge medications or the care you received while you were in the hospital after you are discharged, you can call the unit and asked to speak with the hospitalist on call if the hospitalist that took care of you is not available. Once you are discharged, your primary care physician will handle any further medical issues. Please note that NO REFILLS for any discharge medications will be authorized once you are discharged, as it is imperative that you return to your primary care physician (or establish a relationship with a primary care physician if you do not have one) for your aftercare needs so that they can reassess your need for  medications and monitor your lab values. If you do not have a primary care physician, you can call [phone] for a physician referral.   Increase activity slowly   Complete by: As directed         Allergies as of 08/30/2020   No Known  Allergies     Medication List    TAKE these medications   acetaminophen 325 MG tablet Commonly known as: TYLENOL Take 650 mg by mouth every 6 (six) hours as needed for mild pain, fever or headache.   aspirin EC 81 MG tablet Take 1 tablet (81 mg total) by mouth daily for 21 days. FOR ONLY 3 WEEKS What changed: additional instructions   atorvastatin 40 MG tablet Commonly known as: LIPITOR Take 40 mg by mouth every evening.   calcitRIOL 0.25 MCG capsule Commonly known as: ROCALTROL Take 0.25 mcg by mouth in the morning and at bedtime.   clopidogrel 75 MG tablet Commonly known as: PLAVIX Take 1 tablet (75 mg total) by mouth daily.   donepezil 10 MG tablet Commonly known as: ARICEPT TAKE 1 TABLET BY MOUTH ONCE DAILY AT BEDTIME   DULoxetine 30 MG capsule Commonly known as: Cymbalta Take 1 capsule (30 mg total) by mouth at bedtime.   furosemide 80 MG tablet Commonly known as: LASIX Take 0.5 tablets (40 mg total) by mouth daily. What changed:   how much to take  when to take this   insulin aspart 100 UNIT/ML injection Commonly known as: novoLOG Inject 0-15 Units into the skin 3 (three) times daily before meals. Sliding scale   ketoconazole 2 % cream Commonly known as: NIZORAL Apply 1 application topically daily.   Lantus SoloStar 100 UNIT/ML Solostar Pen Generic drug: insulin glargine Inject 25 Units into the skin daily. What changed:   how much to take  when to take this   levothyroxine 75 MCG tablet Commonly known as: SYNTHROID Take 75 mcg by mouth daily before breakfast.   loratadine 10 MG tablet Commonly known as: CLARITIN Take 10 mg by mouth daily as needed for allergies.   metoprolol succinate 25 MG 24 hr  tablet Commonly known as: TOPROL-XL Take 1 tablet (25 mg total) by mouth daily. Please keep upcoming appt in April with Dr. Tamala Julian before anymore refills. Thank you   MULTIVITAMIN ADULTS PO Take 1 tablet by mouth daily.   niacin 1000 MG CR tablet Commonly known as: NIASPAN Take 1,000 mg by mouth 2 (two) times daily.   OneTouch Verio test strip Generic drug: glucose blood   potassium chloride 10 MEQ tablet Commonly known as: KLOR-CON Take 1 tablet (10 mEq total) by mouth daily. What changed: when to take this   RELION INSULIN SYRINGE 1ML/31G 31G X 5/16" 1 ML Misc Generic drug: Insulin Syringe-Needle U-100 USE AS DIRECTED FIVE TIMES DAILY   saccharomyces boulardii 250 MG capsule Commonly known as: FLORASTOR Take 1 capsule (250 mg total) by mouth 2 (two) times daily.   sertraline 50 MG tablet Commonly known as: ZOLOFT Take 50 mg by mouth daily.   valACYclovir 500 MG tablet Commonly known as: VALTREX Take 500 mg by mouth daily as needed (out break).         Follow-up Information    Little, Lennette Bihari, MD. Schedule an appointment as soon as possible for a visit in 2 week(s).   Specialty: Family Medicine Contact information: [place] [address] [phone]               TOTAL DISCHARGE TIME: 53 minutes  [facility] Pager on [email or web address]  08/30/2020, 9:54 AM

## 2020-08-30 NOTE — TOC Transition Note (Signed)
Transition of Care [facility]) - CM/SW Discharge Note   Patient Details  Name: [name] MRN: [id number] Date of Birth: 03/21/1934  [facility] CM/SW Contact:  Marney Setting, [place] Work Phone Number: 08/30/2020, 11:43 AM   Clinical Narrative:   Nurse to call report to [phone]. Rm. 117    Final next level of care: Skilled Nursing Facility Barriers to Discharge: Barriers Resolved   Patient Goals and CMS Choice Patient states their goals for this hospitalization and ongoing recovery are:: Pt is agreeable to SNF with the hopes of getting stronger. CMS Medicare.gov Compare Post Acute Care list provided to:: Patient Choice offered to / list presented to : Patient  Discharge Placement              Patient chooses bed at: [facility] Patient to be transferred to facility by: [place] Name of family member notified: Romie Minus Patient and family notified of of transfer: 08/30/20  Discharge Plan and Services In-house Referral: Clinical Social Work   Post Acute Care Choice: [place]                               Social Determinants of Health (SDOH) Interventions     Readmission Risk Interventions No flowsheet data found.

## 2020-08-31 ENCOUNTER — Other Ambulatory Visit: Payer: Self-pay

## 2020-08-31 DIAGNOSIS — N184 Chronic kidney disease, stage 4 (severe): Secondary | ICD-10-CM | POA: Diagnosis not present

## 2020-08-31 DIAGNOSIS — E782 Mixed hyperlipidemia: Secondary | ICD-10-CM | POA: Diagnosis not present

## 2020-08-31 DIAGNOSIS — E1122 Type 2 diabetes mellitus with diabetic chronic kidney disease: Secondary | ICD-10-CM | POA: Diagnosis not present

## 2020-08-31 DIAGNOSIS — N189 Chronic kidney disease, unspecified: Secondary | ICD-10-CM | POA: Diagnosis not present

## 2020-08-31 DIAGNOSIS — E785 Hyperlipidemia, unspecified: Secondary | ICD-10-CM | POA: Diagnosis not present

## 2020-08-31 DIAGNOSIS — M1610 Unilateral primary osteoarthritis, unspecified hip: Secondary | ICD-10-CM | POA: Diagnosis not present

## 2020-08-31 DIAGNOSIS — I25119 Atherosclerotic heart disease of native coronary artery with unspecified angina pectoris: Secondary | ICD-10-CM | POA: Diagnosis not present

## 2020-08-31 DIAGNOSIS — N183 Chronic kidney disease, stage 3 unspecified: Secondary | ICD-10-CM | POA: Diagnosis not present

## 2020-08-31 DIAGNOSIS — I4891 Unspecified atrial fibrillation: Secondary | ICD-10-CM | POA: Diagnosis not present

## 2020-08-31 DIAGNOSIS — D582 Other hemoglobinopathies: Secondary | ICD-10-CM | POA: Diagnosis not present

## 2020-08-31 DIAGNOSIS — D500 Iron deficiency anemia secondary to blood loss (chronic): Secondary | ICD-10-CM | POA: Diagnosis not present

## 2020-08-31 DIAGNOSIS — R531 Weakness: Secondary | ICD-10-CM | POA: Diagnosis not present

## 2020-08-31 DIAGNOSIS — I10 Essential (primary) hypertension: Secondary | ICD-10-CM | POA: Diagnosis not present

## 2020-08-31 NOTE — Patient Outreach (Signed)
  [facility]) Care Management Chronic Special Needs Program    08/31/2020  Name: [name], DOB: 1934/01/29  MRN: [id number]   Mr. [name] is enrolled in a chronic special needs plan for Diabetes. Client was admitted to [facility] on 08/19/20 with dx of acute CVA.  Client was discharged to [facility] and rehab on 08/30/20. RNCM will coordinate with [place] Utilization management team on client's progress in the SNF as needed and will follow up with client post discharge from the SNF  [place], [facility], [place] Management [phone]

## 2020-09-05 DIAGNOSIS — K5909 Other constipation: Secondary | ICD-10-CM | POA: Diagnosis not present

## 2020-09-05 DIAGNOSIS — R112 Nausea with vomiting, unspecified: Secondary | ICD-10-CM | POA: Diagnosis not present

## 2020-09-05 DIAGNOSIS — R531 Weakness: Secondary | ICD-10-CM | POA: Diagnosis not present

## 2020-09-05 DIAGNOSIS — R296 Repeated falls: Secondary | ICD-10-CM | POA: Diagnosis not present

## 2020-09-08 ENCOUNTER — Other Ambulatory Visit: Payer: Self-pay

## 2020-09-08 DIAGNOSIS — I714 Abdominal aortic aneurysm, without rupture, unspecified: Secondary | ICD-10-CM

## 2020-09-08 DIAGNOSIS — I6930 Unspecified sequelae of cerebral infarction: Secondary | ICD-10-CM | POA: Diagnosis not present

## 2020-09-08 DIAGNOSIS — R627 Adult failure to thrive: Secondary | ICD-10-CM | POA: Diagnosis not present

## 2020-09-08 DIAGNOSIS — M6281 Muscle weakness (generalized): Secondary | ICD-10-CM | POA: Diagnosis not present

## 2020-09-08 DIAGNOSIS — R112 Nausea with vomiting, unspecified: Secondary | ICD-10-CM | POA: Diagnosis not present

## 2020-09-11 DIAGNOSIS — I6930 Unspecified sequelae of cerebral infarction: Secondary | ICD-10-CM | POA: Diagnosis not present

## 2020-09-11 DIAGNOSIS — G894 Chronic pain syndrome: Secondary | ICD-10-CM | POA: Diagnosis not present

## 2020-09-11 DIAGNOSIS — R112 Nausea with vomiting, unspecified: Secondary | ICD-10-CM | POA: Diagnosis not present

## 2020-09-11 DIAGNOSIS — E1140 Type 2 diabetes mellitus with diabetic neuropathy, unspecified: Secondary | ICD-10-CM | POA: Diagnosis not present

## 2020-09-13 ENCOUNTER — Ambulatory Visit: Payer: HMO | Admitting: Interventional Cardiology

## 2020-09-15 DIAGNOSIS — E1140 Type 2 diabetes mellitus with diabetic neuropathy, unspecified: Secondary | ICD-10-CM | POA: Diagnosis not present

## 2020-09-15 DIAGNOSIS — G894 Chronic pain syndrome: Secondary | ICD-10-CM | POA: Diagnosis not present

## 2020-09-15 DIAGNOSIS — N1832 Chronic kidney disease, stage 3b: Secondary | ICD-10-CM | POA: Diagnosis not present

## 2020-09-19 DIAGNOSIS — R112 Nausea with vomiting, unspecified: Secondary | ICD-10-CM | POA: Diagnosis not present

## 2020-09-19 DIAGNOSIS — I6930 Unspecified sequelae of cerebral infarction: Secondary | ICD-10-CM | POA: Diagnosis not present

## 2020-09-19 DIAGNOSIS — E1140 Type 2 diabetes mellitus with diabetic neuropathy, unspecified: Secondary | ICD-10-CM | POA: Diagnosis not present

## 2020-09-19 DIAGNOSIS — K219 Gastro-esophageal reflux disease without esophagitis: Secondary | ICD-10-CM | POA: Diagnosis not present

## 2020-09-20 ENCOUNTER — Encounter (HOSPITAL_COMMUNITY): Payer: Self-pay | Admitting: Emergency Medicine

## 2020-09-20 ENCOUNTER — Emergency Department (HOSPITAL_COMMUNITY): Payer: HMO

## 2020-09-20 ENCOUNTER — Other Ambulatory Visit: Payer: Self-pay

## 2020-09-20 ENCOUNTER — Emergency Department (HOSPITAL_COMMUNITY)
Admission: EM | Admit: 2020-09-20 | Discharge: 2020-09-20 | Disposition: A | Discharge status: Home / Self Care | Payer: HMO | Attending: Emergency Medicine | Admitting: Emergency Medicine

## 2020-09-20 DIAGNOSIS — Z79899 Other long term (current) drug therapy: Secondary | ICD-10-CM | POA: Insufficient documentation

## 2020-09-20 DIAGNOSIS — Z794 Long term (current) use of insulin: Secondary | ICD-10-CM | POA: Diagnosis not present

## 2020-09-20 DIAGNOSIS — E119 Type 2 diabetes mellitus without complications: Secondary | ICD-10-CM | POA: Diagnosis not present

## 2020-09-20 DIAGNOSIS — K5900 Constipation, unspecified: Secondary | ICD-10-CM | POA: Insufficient documentation

## 2020-09-20 DIAGNOSIS — N183 Chronic kidney disease, stage 3 unspecified: Secondary | ICD-10-CM | POA: Diagnosis not present

## 2020-09-20 DIAGNOSIS — E039 Hypothyroidism, unspecified: Secondary | ICD-10-CM | POA: Insufficient documentation

## 2020-09-20 DIAGNOSIS — R197 Diarrhea, unspecified: Secondary | ICD-10-CM | POA: Diagnosis not present

## 2020-09-20 DIAGNOSIS — R112 Nausea with vomiting, unspecified: Secondary | ICD-10-CM

## 2020-09-20 DIAGNOSIS — K5750 Diverticulosis of both small and large intestine without perforation or abscess without bleeding: Secondary | ICD-10-CM | POA: Diagnosis not present

## 2020-09-20 DIAGNOSIS — I700 Atherosclerosis of aorta: Secondary | ICD-10-CM | POA: Diagnosis not present

## 2020-09-20 DIAGNOSIS — K429 Umbilical hernia without obstruction or gangrene: Secondary | ICD-10-CM | POA: Diagnosis not present

## 2020-09-20 DIAGNOSIS — I1310 Hypertensive heart and chronic kidney disease without heart failure, with stage 1 through stage 4 chronic kidney disease, or unspecified chronic kidney disease: Secondary | ICD-10-CM | POA: Insufficient documentation

## 2020-09-20 DIAGNOSIS — K4090 Unilateral inguinal hernia, without obstruction or gangrene, not specified as recurrent: Secondary | ICD-10-CM | POA: Diagnosis not present

## 2020-09-20 DIAGNOSIS — I25810 Atherosclerosis of coronary artery bypass graft(s) without angina pectoris: Secondary | ICD-10-CM | POA: Diagnosis not present

## 2020-09-20 DIAGNOSIS — I10 Essential (primary) hypertension: Secondary | ICD-10-CM | POA: Diagnosis not present

## 2020-09-20 DIAGNOSIS — R1110 Vomiting, unspecified: Secondary | ICD-10-CM | POA: Diagnosis present

## 2020-09-20 DIAGNOSIS — Z87891 Personal history of nicotine dependence: Secondary | ICD-10-CM | POA: Insufficient documentation

## 2020-09-20 LAB — LIPASE, BLOOD: Lipase: 94 U/L — ABNORMAL HIGH (ref 11–51)

## 2020-09-20 LAB — CBC WITH DIFFERENTIAL/PLATELET
Abs Immature Granulocytes: 0.09 10*3/uL — ABNORMAL HIGH (ref 0.00–0.07)
Basophils Absolute: 0.1 10*3/uL (ref 0.0–0.1)
Basophils Relative: 1 %
Eosinophils Absolute: 0.3 10*3/uL (ref 0.0–0.5)
Eosinophils Relative: 3 %
HCT: 51.9 % (ref 39.0–52.0)
Hemoglobin: 16.5 g/dL (ref 13.0–17.0)
Immature Granulocytes: 1 %
Lymphocytes Relative: 9 %
Lymphs Abs: 0.9 10*3/uL (ref 0.7–4.0)
MCH: 29 pg (ref 26.0–34.0)
MCHC: 31.8 g/dL (ref 30.0–36.0)
MCV: 91.2 fL (ref 80.0–100.0)
Monocytes Absolute: 0.7 10*3/uL (ref 0.1–1.0)
Monocytes Relative: 6 %
Neutro Abs: 8.6 10*3/uL — ABNORMAL HIGH (ref 1.7–7.7)
Neutrophils Relative %: 80 %
Platelets: 201 10*3/uL (ref 150–400)
RBC: 5.69 MIL/uL (ref 4.22–5.81)
RDW: 14.8 % (ref 11.5–15.5)
WBC: 10.6 10*3/uL — ABNORMAL HIGH (ref 4.0–10.5)
nRBC: 0 % (ref 0.0–0.2)

## 2020-09-20 LAB — MAGNESIUM: Magnesium: 2.3 mg/dL (ref 1.7–2.4)

## 2020-09-20 LAB — I-STAT CHEM 8, ED
BUN: 40 mg/dL — ABNORMAL HIGH (ref 8–23)
Calcium, Ion: 1.28 mmol/L (ref 1.15–1.40)
Chloride: 99 mmol/L (ref 98–111)
Creatinine, Ser: 3.1 mg/dL — ABNORMAL HIGH (ref 0.61–1.24)
Glucose, Bld: 225 mg/dL — ABNORMAL HIGH (ref 70–99)
HCT: 51 % (ref 39.0–52.0)
Hemoglobin: 17.3 g/dL — ABNORMAL HIGH (ref 13.0–17.0)
Potassium: 4.1 mmol/L (ref 3.5–5.1)
Sodium: 135 mmol/L (ref 135–145)
TCO2: 28 mmol/L (ref 22–32)

## 2020-09-20 LAB — COMPREHENSIVE METABOLIC PANEL
ALT: 12 U/L (ref 0–44)
AST: 15 U/L (ref 15–41)
Albumin: 3.3 g/dL — ABNORMAL LOW (ref 3.5–5.0)
Alkaline Phosphatase: 72 U/L (ref 38–126)
Anion gap: 13 (ref 5–15)
BUN: 38 mg/dL — ABNORMAL HIGH (ref 8–23)
CO2: 25 mmol/L (ref 22–32)
Calcium: 10.5 mg/dL — ABNORMAL HIGH (ref 8.9–10.3)
Chloride: 97 mmol/L — ABNORMAL LOW (ref 98–111)
Creatinine, Ser: 3.11 mg/dL — ABNORMAL HIGH (ref 0.61–1.24)
GFR, Estimated: 17 mL/min — ABNORMAL LOW (ref >60)
Glucose, Bld: 230 mg/dL — ABNORMAL HIGH (ref 70–99)
Potassium: 4.3 mmol/L (ref 3.5–5.1)
Sodium: 135 mmol/L (ref 135–145)
Total Bilirubin: 0.9 mg/dL (ref 0.3–1.2)
Total Protein: 6.1 g/dL — ABNORMAL LOW (ref 6.5–8.1)

## 2020-09-20 MED ORDER — DIPHENHYDRAMINE HCL 50 MG/ML IJ SOLN
12.5000 mg | Freq: Once | INTRAMUSCULAR | Status: AC
  Administered 2020-09-20: 12.5 mg via INTRAVENOUS
  Filled 2020-09-20: qty 1

## 2020-09-20 MED ORDER — METOCLOPRAMIDE HCL 5 MG/ML IJ SOLN
5.0000 mg | Freq: Once | INTRAMUSCULAR | Status: AC
  Administered 2020-09-20: 5 mg via INTRAVENOUS
  Filled 2020-09-20: qty 2

## 2020-09-20 MED ORDER — METOCLOPRAMIDE HCL 10 MG PO TABS: 10.0000 mg | ORAL_TABLET | Freq: Four times a day (QID) | ORAL | 0 refills | Status: AC | PRN

## 2020-09-20 MED ORDER — SODIUM CHLORIDE 0.9 % IV BOLUS
1000.0000 mL | Freq: Once | INTRAVENOUS | Status: AC
  Administered 2020-09-20: 1000 mL via INTRAVENOUS

## 2020-09-20 NOTE — ED Notes (Signed)
PTAR called for transport to [facility] °

## 2020-09-20 NOTE — ED Notes (Signed)
Pt discharged with ptar, going to nursing home, son at bedside. VSS on ccm, nadn, PT resting.

## 2020-09-20 NOTE — ED Notes (Signed)
Fluid challenge initiated

## 2020-09-20 NOTE — ED Provider Notes (Signed)
[facility] EMERGENCY DEPARTMENT Provider Note   CSN: [id number] Arrival date & time: 09/20/20  1241     History Chief Complaint  Patient presents with  . Emesis  . Diarrhea    [name] is a 86 y.o. male.  86 yo M with a chief complaints of nausea and vomiting.  This been going on for about 3 weeks now.  Patient has been taking different doses of Zofran at the facility in which she lives without significant improvement.  Eventually today they decided to send him to the ED.  Reportedly per the patient he had an x-ray that showed that he was a bit constipated.  He has had a couple loose stools during this time.  Denies fevers or abdominal pain.  Denies recent change in medication.  Denies chest pain or shortness of breath.  He does not feel like food gets stuck in his throat.  The history is provided by the patient.  Emesis Severity:  Moderate Duration:  3 weeks Timing:  Constant Progression:  Unchanged Chronicity:  New Recent urination:  Normal Relieved by:  Nothing Worsened by:  Nothing Ineffective treatments:  None tried Associated symptoms: diarrhea   Associated symptoms: no abdominal pain, no arthralgias, no chills, no fever, no headaches and no myalgias   Diarrhea Associated symptoms: vomiting   Associated symptoms: no abdominal pain, no arthralgias, no chills, no fever, no headaches and no myalgias        Past Medical History:  Diagnosis Date  . Bilateral foot-drop 01/12/2020  . Coronary artery disease   . Diabetes mellitus without complication ([place])   . Diabetic neuropathy ([place]) 09/16/2017   Mild bilateral foot drops  . Gait abnormality 09/16/2017  . Hyperlipidemia   . Hypertension   . Memory difficulty 03/06/2017  . Toxic goiter     Patient Active Problem List   Diagnosis Date Noted  . CKD (chronic kidney disease), stage III ([place]) 08/20/2020  . Ischemic stroke ([place]) 08/20/2020  . Thrombocytopenia ([place]) 08/20/2020  . Hypothyroidism  08/20/2020  . Dementia ([place]) 08/20/2020  . Depression 08/20/2020  . Cellulitis 08/20/2020  . Bilateral foot-drop 01/12/2020  . Diabetic neuropathy ([place]) 09/16/2017  . Gait abnormality 09/16/2017  . Memory difficulty 03/06/2017  . GIB (gastrointestinal bleeding) 03/31/2016  . Rectal bleeding   . Chronic kidney disease, stage IV (severe) ([place]) 03/23/2015  . Coronary artery disease 03/22/2014  . Essential hypertension 03/22/2014  . Hyperlipidemia 03/22/2014  . Multinodular goiter 03/22/2014  . DM (diabetes mellitus) ([place]) 03/22/2014  . Abdominal aortic aneurysm ([place]) 03/04/2012    Past Surgical History:  Procedure Laterality Date  . ABDOMINAL AORTIC ANEURYSM REPAIR    . cardiac bypass    . CORONARY ARTERY BYPASS GRAFT         Family History  Problem Relation Age of Onset  . Stroke Mother   . Stroke Father   . Heart attack Father   . Diabetes Sister   . Heart attack Sister   . Diabetes Brother   . Heart attack Brother   . Diabetes Brother   . Diabetes Brother   . Heart attack Brother     Social History   Tobacco Use  . Smoking status: Former Smoker    Packs/day: 1.00    Quit date: 03/31/1996    Years since quitting: 24.4  . Smokeless tobacco: Never Used  Vaping Use  . Vaping Use: Never used  Substance Use Topics  . Alcohol use: No  .  Drug use: No    Home Medications Prior to Admission medications   Medication Sig Start Date End Date Taking? Authorizing Provider  acetaminophen (TYLENOL) 325 MG tablet Take 650 mg by mouth every 6 (six) hours as needed for mild pain, fever or headache.    [provider]  atorvastatin (LIPITOR) 40 MG tablet Take 40 mg by mouth every evening.     [provider]  calcitRIOL (ROCALTROL) 0.25 MCG capsule Take 0.25 mcg by mouth in the morning and at bedtime.  04/29/17   [provider]  clopidogrel (PLAVIX) 75 MG tablet Take 1 tablet (75 mg total) by mouth daily. 08/25/20   Bonnielee Haff, MD  donepezil  (ARICEPT) 10 MG tablet TAKE 1 TABLET BY MOUTH ONCE DAILY AT BEDTIME Patient taking differently: Take 10 mg by mouth at bedtime.  01/10/20   Kathrynn Ducking, MD  DULoxetine (CYMBALTA) 30 MG capsule Take 1 capsule (30 mg total) by mouth at bedtime. 01/12/20   Kathrynn Ducking, MD  furosemide (LASIX) 80 MG tablet Take 0.5 tablets (40 mg total) by mouth daily. 08/30/20   Bonnielee Haff, MD  insulin aspart (NOVOLOG) 100 UNIT/ML injection Inject 0-15 Units into the skin 3 (three) times daily before meals. Sliding scale    [provider]  insulin glargine (LANTUS SOLOSTAR) 100 UNIT/ML Solostar Pen Inject 25 Units into the skin daily. 08/30/20   Bonnielee Haff, MD  ketoconazole (NIZORAL) 2 % cream Apply 1 application topically daily.    [provider]  levothyroxine (SYNTHROID, LEVOTHROID) 75 MCG tablet Take 75 mcg by mouth daily before breakfast.    [provider]  loratadine (CLARITIN) 10 MG tablet Take 10 mg by mouth daily as needed for allergies.     [provider]  metoCLOPramide (REGLAN) 10 MG tablet Take 1 tablet (10 mg total) by mouth every 6 (six) hours as needed for nausea (nausea/headache). 09/20/20   Deno Etienne, DO  metoprolol succinate (TOPROL-XL) 25 MG 24 hr tablet Take 1 tablet (25 mg total) by mouth daily. Please keep upcoming appt in April with Dr. Tamala Julian before anymore refills. Thank you 03/20/20   Belva Crome, MD  Multiple Vitamins-Minerals (MULTIVITAMIN ADULTS PO) Take 1 tablet by mouth daily.    [provider]  niacin (NIASPAN) 1000 MG CR tablet Take 1,000 mg by mouth 2 (two) times daily.    [provider]  [facility] test strip  01/12/19   [provider]  potassium chloride (KLOR-CON) 10 MEQ tablet Take 1 tablet (10 mEq total) by mouth daily. 08/30/20   Bonnielee Haff, MD  RELION INSULIN SYRINGE 1ML/31G 31G X 5/16" 1 ML MISC USE AS DIRECTED FIVE TIMES DAILY 12/10/19   [provider]  saccharomyces boulardii  (FLORASTOR) 250 MG capsule Take 1 capsule (250 mg total) by mouth 2 (two) times daily. 08/24/20   Bonnielee Haff, MD  sertraline (ZOLOFT) 50 MG tablet Take 50 mg by mouth daily.    [provider]  valACYclovir (VALTREX) 500 MG tablet Take 500 mg by mouth daily as needed (out break).     [provider]    Allergies    Patient has no known allergies.  Review of Systems   Review of Systems  Constitutional: Negative for chills and fever.  HENT: Negative for congestion and facial swelling.   Eyes: Negative for discharge and visual disturbance.  Respiratory: Negative for shortness of breath.   Cardiovascular: Negative for chest pain and palpitations.  Gastrointestinal:  Positive for diarrhea, nausea and vomiting. Negative for abdominal pain.  Musculoskeletal: Negative for arthralgias and myalgias.  Skin: Negative for color change and rash.  Neurological: Negative for tremors, syncope and headaches.  Psychiatric/Behavioral: Negative for confusion and dysphoric mood.    Physical Exam Updated Vital Signs BP 134/71   Pulse 74   Temp 99.1 F (37.3 C) (Oral)   Resp 20   Ht 5\' 10"  (1.778 m)   Wt 87.7 kg   SpO2 100%   BMI 27.74 kg/m   Physical Exam Vitals and nursing note reviewed.  Constitutional:      Appearance: He is well-developed.  HENT:     Head: Normocephalic and atraumatic.  Eyes:     Pupils: Pupils are equal, round, and reactive to light.  Neck:     Vascular: No JVD.  Cardiovascular:     Rate and Rhythm: Normal rate and regular rhythm.     Heart sounds: No murmur heard.  No friction rub. No gallop.   Pulmonary:     Effort: No respiratory distress.     Breath sounds: No wheezing.  Abdominal:     General: There is distension (mild, tympanitic to percussion).     Tenderness: There is abdominal tenderness (RLQ). There is no guarding or rebound.  Musculoskeletal:        General: Normal range of motion.     Cervical back: Normal range of motion and  neck supple.  Skin:    Coloration: Skin is not pale.     Findings: No rash.  Neurological:     Mental Status: He is alert and oriented to person, place, and time.  Psychiatric:        Behavior: Behavior normal.     ED Results / Procedures / Treatments   Labs (all labs ordered are listed, but only abnormal results are displayed) Labs Reviewed  CBC WITH DIFFERENTIAL/PLATELET - Abnormal; Notable for the following components:      Result Value   WBC 10.6 (*)    Neutro Abs 8.6 (*)    Abs Immature Granulocytes 0.09 (*)    All other components within normal limits  COMPREHENSIVE METABOLIC PANEL - Abnormal; Notable for the following components:   Chloride 97 (*)    Glucose, Bld 230 (*)    BUN 38 (*)    Creatinine, Ser 3.11 (*)    Calcium 10.5 (*)    Total Protein 6.1 (*)    Albumin 3.3 (*)    GFR, Estimated 17 (*)    All other components within normal limits  LIPASE, BLOOD - Abnormal; Notable for the following components:   Lipase 94 (*)    All other components within normal limits  I-STAT CHEM 8, ED - Abnormal; Notable for the following components:   BUN 40 (*)    Creatinine, Ser 3.10 (*)    Glucose, Bld 225 (*)    Hemoglobin 17.3 (*)    All other components within normal limits  MAGNESIUM  CBC  URINALYSIS, ROUTINE W REFLEX MICROSCOPIC    EKG EKG Interpretation  Date/Time:  Wednesday September 20 2020 13:08:16 EDT Ventricular Rate:  78 PR Interval:    QRS Duration: 81 QT Interval:  327 QTC Calculation: 353 R Axis:   -27 Text Interpretation: Sinus or ectopic atrial rhythm Multiple premature complexes, vent & supraven Prolonged PR interval Anterior infarct, old Minimal ST elevation, lateral leads Baseline wander in lead(s) V6 Partial missing lead(s): V6 No significant change since last tracing Confirmed by Tyrone Nine,  Linna Hoff [phone]) on 09/20/2020 1:32:34 PM   Radiology CT ABDOMEN PELVIS WO CONTRAST  Result Date: 09/20/2020 CLINICAL DATA:  Abdominal pain EXAM: CT ABDOMEN AND  PELVIS WITHOUT CONTRAST TECHNIQUE: Multidetector CT imaging of the abdomen and pelvis was performed following the standard protocol without IV contrast. COMPARISON:  2017 FINDINGS: Lower chest: No acute abnormality. Hepatobiliary: No focal liver abnormality is seen. No gallstones, gallbladder wall thickening, or biliary dilatation. Pancreas: Unremarkable. Spleen: Unremarkable. Adrenals/Urinary Tract: Adrenal are unremarkable. Bilateral renal cortical scarring. Bladder is unremarkable. Stomach/Bowel: Stomach is within normal limits. Bowel is normal in caliber. Mild colonic diverticulosis. Vascular/Lymphatic: Aortic atherosclerosis. Aorto bi-iliac stent graft. No enlarged lymph nodes identified. Reproductive: Prominence of the prostate. Other: No ascites. Small fat containing left inguinal hernia. Small fat containing umbilical hernia. Musculoskeletal: Degenerative changes of the spine. No acute osseous abnormality. IMPRESSION: No acute abnormality.  Stable chronic findings detailed above. Electronically Signed   By: Macy Mis M.D.   On: 09/20/2020 14:27    Procedures Procedures (including critical care time)  Medications Ordered in ED Medications  sodium chloride 0.9 % bolus 1,000 mL (0 mLs Intravenous Stopped 09/20/20 1512)  metoCLOPramide (REGLAN) injection 5 mg (5 mg Intravenous Given 09/20/20 1324)  diphenhydrAMINE (BENADRYL) injection 12.5 mg (12.5 mg Intravenous Given 09/20/20 1324)    ED Course  I have reviewed the triage vital signs and the nursing notes.  Pertinent labs & imaging results that were available during my care of the patient were reviewed by me and considered in my medical decision making (see chart for details).    MDM Rules/Calculators/A&P                          86 yo M with a chief complaints of nausea and vomiting.  Going on for the past 3 weeks.  Difficulty tolerating anything at his nursing facility.  Reportedly had a plain film that showed constipation.  Was  sent here with intractable nausea and vomiting.  Mild right lower quadrant tenderness on my exam.  Will obtain a CT scan.  Blood work.  Reassess.  CT scan without acute intra-abdominal pathology.  Lab work is reassuring.  He has a mild rise in his creatinine.  Given a bag of IV fluids here.  He is able to tolerate by mouth without issue.  We will start him on Reglan as an outpatient.  Follow-up with GI.  4:10 PM:  I have discussed the diagnosis/risks/treatment options with the patient and believe the pt to be eligible for discharge home to follow-up with PCP. We also discussed returning to the ED immediately if new or worsening sx occur. We discussed the sx which are most concerning (e.g., sudden worsening pain, fever, inability to tolerate by mouth) that necessitate immediate return. Medications administered to the patient during their visit and any new prescriptions provided to the patient are listed below.  Medications given during this visit Medications  sodium chloride 0.9 % bolus 1,000 mL (0 mLs Intravenous Stopped 09/20/20 1512)  metoCLOPramide (REGLAN) injection 5 mg (5 mg Intravenous Given 09/20/20 1324)  diphenhydrAMINE (BENADRYL) injection 12.5 mg (12.5 mg Intravenous Given 09/20/20 1324)     The patient appears reasonably screen and/or stabilized for discharge and I doubt any other medical condition or other [facility] requiring further screening, evaluation, or treatment in the ED at this time prior to discharge.     Final Clinical Impression(s) / ED Diagnoses Final diagnoses:  Nausea and vomiting in adult  Rx / DC Orders ED Discharge Orders         Ordered    metoCLOPramide (REGLAN) 10 MG tablet  Every 6 hours PRN        09/20/20 1601           Deno Etienne, DO 09/20/20 1610

## 2020-09-20 NOTE — ED Notes (Signed)
At change of shift, pt sleeping in stretcher easily arousable, aao4, gcs15, breathing nonlabored pt repositioned in bed. Son at bedside, bp soft but otherwise stable map 51. Other vss on ccm. Pt waiting for ptar. Side rails up, call bell in place

## 2020-09-20 NOTE — Discharge Instructions (Signed)
Return for abdominal pain, inability to eat or drink

## 2020-09-20 NOTE — ED Notes (Signed)
No problems with Fluid challenge. Pt denies Nausea/vomit. States he feels fine

## 2020-09-20 NOTE — ED Triage Notes (Signed)
Pt brought to ED by [facility] from [facility] care for complain of Nausea, Vomiting and diarrhea for the past 3 weeks no relief with any medication at SNF. BP 130/88, HR 74, SPO2 97% CBG 228.

## 2020-09-21 DIAGNOSIS — K219 Gastro-esophageal reflux disease without esophagitis: Secondary | ICD-10-CM | POA: Diagnosis not present

## 2020-09-21 DIAGNOSIS — I6930 Unspecified sequelae of cerebral infarction: Secondary | ICD-10-CM | POA: Diagnosis not present

## 2020-09-21 DIAGNOSIS — R112 Nausea with vomiting, unspecified: Secondary | ICD-10-CM | POA: Diagnosis not present

## 2020-09-21 DIAGNOSIS — E1140 Type 2 diabetes mellitus with diabetic neuropathy, unspecified: Secondary | ICD-10-CM | POA: Diagnosis not present

## 2020-09-25 DIAGNOSIS — E1140 Type 2 diabetes mellitus with diabetic neuropathy, unspecified: Secondary | ICD-10-CM | POA: Diagnosis not present

## 2020-09-25 DIAGNOSIS — R531 Weakness: Secondary | ICD-10-CM | POA: Diagnosis not present

## 2020-09-25 DIAGNOSIS — R112 Nausea with vomiting, unspecified: Secondary | ICD-10-CM | POA: Diagnosis not present

## 2020-09-25 DIAGNOSIS — I6930 Unspecified sequelae of cerebral infarction: Secondary | ICD-10-CM | POA: Diagnosis not present

## 2020-09-28 DIAGNOSIS — I69318 Other symptoms and signs involving cognitive functions following cerebral infarction: Secondary | ICD-10-CM | POA: Diagnosis not present

## 2020-09-28 DIAGNOSIS — R112 Nausea with vomiting, unspecified: Secondary | ICD-10-CM | POA: Diagnosis not present

## 2020-09-28 DIAGNOSIS — E1140 Type 2 diabetes mellitus with diabetic neuropathy, unspecified: Secondary | ICD-10-CM | POA: Diagnosis not present

## 2020-09-28 DIAGNOSIS — G894 Chronic pain syndrome: Secondary | ICD-10-CM | POA: Diagnosis not present

## 2020-10-04 ENCOUNTER — Other Ambulatory Visit (HOSPITAL_COMMUNITY): Payer: HMO

## 2020-10-04 ENCOUNTER — Ambulatory Visit: Payer: HMO | Admitting: Vascular Surgery

## 2020-10-12 DIAGNOSIS — I2510 Atherosclerotic heart disease of native coronary artery without angina pectoris: Secondary | ICD-10-CM | POA: Diagnosis not present

## 2020-10-12 DIAGNOSIS — F329 Major depressive disorder, single episode, unspecified: Secondary | ICD-10-CM | POA: Diagnosis not present

## 2020-10-12 DIAGNOSIS — E039 Hypothyroidism, unspecified: Secondary | ICD-10-CM | POA: Diagnosis not present

## 2020-10-12 DIAGNOSIS — F0390 Unspecified dementia without behavioral disturbance: Secondary | ICD-10-CM | POA: Diagnosis not present

## 2020-10-14 DIAGNOSIS — E876 Hypokalemia: Secondary | ICD-10-CM | POA: Diagnosis not present

## 2020-10-14 DIAGNOSIS — N184 Chronic kidney disease, stage 4 (severe): Secondary | ICD-10-CM | POA: Diagnosis not present

## 2020-10-14 DIAGNOSIS — I639 Cerebral infarction, unspecified: Secondary | ICD-10-CM | POA: Diagnosis not present

## 2020-10-14 DIAGNOSIS — E039 Hypothyroidism, unspecified: Secondary | ICD-10-CM | POA: Diagnosis not present

## 2020-10-14 DIAGNOSIS — E1140 Type 2 diabetes mellitus with diabetic neuropathy, unspecified: Secondary | ICD-10-CM | POA: Diagnosis not present

## 2020-10-14 DIAGNOSIS — I69318 Other symptoms and signs involving cognitive functions following cerebral infarction: Secondary | ICD-10-CM | POA: Diagnosis not present

## 2020-10-14 DIAGNOSIS — Z794 Long term (current) use of insulin: Secondary | ICD-10-CM | POA: Diagnosis not present

## 2020-10-14 DIAGNOSIS — E1122 Type 2 diabetes mellitus with diabetic chronic kidney disease: Secondary | ICD-10-CM | POA: Diagnosis not present

## 2020-10-14 DIAGNOSIS — G894 Chronic pain syndrome: Secondary | ICD-10-CM | POA: Diagnosis not present

## 2020-10-14 DIAGNOSIS — Z9181 History of falling: Secondary | ICD-10-CM | POA: Diagnosis not present

## 2020-10-14 DIAGNOSIS — E785 Hyperlipidemia, unspecified: Secondary | ICD-10-CM | POA: Diagnosis not present

## 2020-10-14 DIAGNOSIS — I69991 Dysphagia following unspecified cerebrovascular disease: Secondary | ICD-10-CM | POA: Diagnosis not present

## 2020-10-14 DIAGNOSIS — I2510 Atherosclerotic heart disease of native coronary artery without angina pectoris: Secondary | ICD-10-CM | POA: Diagnosis not present

## 2020-10-14 DIAGNOSIS — Z951 Presence of aortocoronary bypass graft: Secondary | ICD-10-CM | POA: Diagnosis not present

## 2020-10-14 DIAGNOSIS — I69354 Hemiplegia and hemiparesis following cerebral infarction affecting left non-dominant side: Secondary | ICD-10-CM | POA: Diagnosis not present

## 2020-10-14 DIAGNOSIS — F419 Anxiety disorder, unspecified: Secondary | ICD-10-CM | POA: Diagnosis not present

## 2020-10-14 DIAGNOSIS — F0280 Dementia in other diseases classified elsewhere without behavioral disturbance: Secondary | ICD-10-CM | POA: Diagnosis not present

## 2020-10-14 DIAGNOSIS — E1165 Type 2 diabetes mellitus with hyperglycemia: Secondary | ICD-10-CM | POA: Diagnosis not present

## 2020-10-14 DIAGNOSIS — F32A Depression, unspecified: Secondary | ICD-10-CM | POA: Diagnosis not present

## 2020-10-14 DIAGNOSIS — I129 Hypertensive chronic kidney disease with stage 1 through stage 4 chronic kidney disease, or unspecified chronic kidney disease: Secondary | ICD-10-CM | POA: Diagnosis not present

## 2020-10-14 DIAGNOSIS — Z7902 Long term (current) use of antithrombotics/antiplatelets: Secondary | ICD-10-CM | POA: Diagnosis not present

## 2020-10-14 DIAGNOSIS — M6281 Muscle weakness (generalized): Secondary | ICD-10-CM | POA: Diagnosis not present

## 2020-10-14 DIAGNOSIS — N179 Acute kidney failure, unspecified: Secondary | ICD-10-CM | POA: Diagnosis not present

## 2020-10-16 ENCOUNTER — Other Ambulatory Visit: Payer: Self-pay

## 2020-10-16 NOTE — Patient Outreach (Signed)
  [facility]) Care Management Chronic Special Needs Program  10/16/2020  Name: [name] DOB: Oct 13, 1934  MRN: [id number]  Mr. Malik Paar is enrolled in a chronic special needs plan for Diabetes. Reviewed and updated care plan. Client was admitted to [facility] on 08/19/20 with dx of acute CVA.  Client was discharged to [facility] and rehab on 08/30/20 and discharged home on 10/12/20   Goals Addressed            This Visit's Progress   . Client will not report change from baseline and no repeated symptoms of stroke with in the next 6 months       Sent EMMI: Stroke    . General - Client will not be readmitted within 30 days (C-SNP)       Discharged from skilled nursing home on 10/12/20 Transition of care call by [facility] discharge Please follow discharge instructions and call provider if you have any questions. Please attend all follow up appointments as scheduled. Please take your medications as prescribed. Please call 24 Hour nurse advice line as needed [phone]).        Plan:  Send successful  letter with a copy of their individualized care plan, Send individual care plan to provider and Send educational material: EMMI: Stroke  Chronic care management coordinator will outreach in:  1-2 months     [place], [facility], [place] Management Coordinator [place] Management [phone]

## 2020-10-24 DIAGNOSIS — E039 Hypothyroidism, unspecified: Secondary | ICD-10-CM | POA: Diagnosis not present

## 2020-10-24 DIAGNOSIS — E785 Hyperlipidemia, unspecified: Secondary | ICD-10-CM | POA: Diagnosis not present

## 2020-10-24 DIAGNOSIS — E1165 Type 2 diabetes mellitus with hyperglycemia: Secondary | ICD-10-CM | POA: Diagnosis not present

## 2020-10-24 DIAGNOSIS — E876 Hypokalemia: Secondary | ICD-10-CM | POA: Diagnosis not present

## 2020-10-24 DIAGNOSIS — E1122 Type 2 diabetes mellitus with diabetic chronic kidney disease: Secondary | ICD-10-CM | POA: Diagnosis not present

## 2020-10-24 DIAGNOSIS — Z7902 Long term (current) use of antithrombotics/antiplatelets: Secondary | ICD-10-CM | POA: Diagnosis not present

## 2020-10-24 DIAGNOSIS — I69354 Hemiplegia and hemiparesis following cerebral infarction affecting left non-dominant side: Secondary | ICD-10-CM | POA: Diagnosis not present

## 2020-10-24 DIAGNOSIS — F0280 Dementia in other diseases classified elsewhere without behavioral disturbance: Secondary | ICD-10-CM | POA: Diagnosis not present

## 2020-10-24 DIAGNOSIS — F419 Anxiety disorder, unspecified: Secondary | ICD-10-CM | POA: Diagnosis not present

## 2020-10-24 DIAGNOSIS — Z794 Long term (current) use of insulin: Secondary | ICD-10-CM | POA: Diagnosis not present

## 2020-10-24 DIAGNOSIS — I129 Hypertensive chronic kidney disease with stage 1 through stage 4 chronic kidney disease, or unspecified chronic kidney disease: Secondary | ICD-10-CM | POA: Diagnosis not present

## 2020-10-24 DIAGNOSIS — Z951 Presence of aortocoronary bypass graft: Secondary | ICD-10-CM | POA: Diagnosis not present

## 2020-10-24 DIAGNOSIS — Z9181 History of falling: Secondary | ICD-10-CM | POA: Diagnosis not present

## 2020-10-24 DIAGNOSIS — N184 Chronic kidney disease, stage 4 (severe): Secondary | ICD-10-CM | POA: Diagnosis not present

## 2020-10-24 DIAGNOSIS — F32A Depression, unspecified: Secondary | ICD-10-CM | POA: Diagnosis not present

## 2020-10-24 DIAGNOSIS — N179 Acute kidney failure, unspecified: Secondary | ICD-10-CM | POA: Diagnosis not present

## 2020-10-24 DIAGNOSIS — E1140 Type 2 diabetes mellitus with diabetic neuropathy, unspecified: Secondary | ICD-10-CM | POA: Diagnosis not present

## 2020-10-24 DIAGNOSIS — G894 Chronic pain syndrome: Secondary | ICD-10-CM | POA: Diagnosis not present

## 2020-10-24 DIAGNOSIS — I69318 Other symptoms and signs involving cognitive functions following cerebral infarction: Secondary | ICD-10-CM | POA: Diagnosis not present

## 2020-10-24 DIAGNOSIS — I2510 Atherosclerotic heart disease of native coronary artery without angina pectoris: Secondary | ICD-10-CM | POA: Diagnosis not present

## 2020-11-08 ENCOUNTER — Ambulatory Visit: Payer: HMO | Admitting: Podiatry

## 2020-11-08 DIAGNOSIS — Z951 Presence of aortocoronary bypass graft: Secondary | ICD-10-CM | POA: Diagnosis not present

## 2020-11-08 DIAGNOSIS — I69354 Hemiplegia and hemiparesis following cerebral infarction affecting left non-dominant side: Secondary | ICD-10-CM | POA: Diagnosis not present

## 2020-11-08 DIAGNOSIS — I129 Hypertensive chronic kidney disease with stage 1 through stage 4 chronic kidney disease, or unspecified chronic kidney disease: Secondary | ICD-10-CM | POA: Diagnosis not present

## 2020-11-08 DIAGNOSIS — G894 Chronic pain syndrome: Secondary | ICD-10-CM | POA: Diagnosis not present

## 2020-11-08 DIAGNOSIS — E876 Hypokalemia: Secondary | ICD-10-CM | POA: Diagnosis not present

## 2020-11-08 DIAGNOSIS — N184 Chronic kidney disease, stage 4 (severe): Secondary | ICD-10-CM | POA: Diagnosis not present

## 2020-11-08 DIAGNOSIS — F0280 Dementia in other diseases classified elsewhere without behavioral disturbance: Secondary | ICD-10-CM | POA: Diagnosis not present

## 2020-11-08 DIAGNOSIS — E1122 Type 2 diabetes mellitus with diabetic chronic kidney disease: Secondary | ICD-10-CM | POA: Diagnosis not present

## 2020-11-08 DIAGNOSIS — E1165 Type 2 diabetes mellitus with hyperglycemia: Secondary | ICD-10-CM | POA: Diagnosis not present

## 2020-11-08 DIAGNOSIS — F32A Depression, unspecified: Secondary | ICD-10-CM | POA: Diagnosis not present

## 2020-11-08 DIAGNOSIS — E039 Hypothyroidism, unspecified: Secondary | ICD-10-CM | POA: Diagnosis not present

## 2020-11-08 DIAGNOSIS — E785 Hyperlipidemia, unspecified: Secondary | ICD-10-CM | POA: Diagnosis not present

## 2020-11-08 DIAGNOSIS — F419 Anxiety disorder, unspecified: Secondary | ICD-10-CM | POA: Diagnosis not present

## 2020-11-08 DIAGNOSIS — N179 Acute kidney failure, unspecified: Secondary | ICD-10-CM | POA: Diagnosis not present

## 2020-11-08 DIAGNOSIS — I2510 Atherosclerotic heart disease of native coronary artery without angina pectoris: Secondary | ICD-10-CM | POA: Diagnosis not present

## 2020-11-08 DIAGNOSIS — Z9181 History of falling: Secondary | ICD-10-CM | POA: Diagnosis not present

## 2020-11-08 DIAGNOSIS — E1140 Type 2 diabetes mellitus with diabetic neuropathy, unspecified: Secondary | ICD-10-CM | POA: Diagnosis not present

## 2020-11-08 DIAGNOSIS — I69318 Other symptoms and signs involving cognitive functions following cerebral infarction: Secondary | ICD-10-CM | POA: Diagnosis not present

## 2020-11-08 DIAGNOSIS — Z7902 Long term (current) use of antithrombotics/antiplatelets: Secondary | ICD-10-CM | POA: Diagnosis not present

## 2020-11-08 DIAGNOSIS — Z794 Long term (current) use of insulin: Secondary | ICD-10-CM | POA: Diagnosis not present

## 2020-11-09 ENCOUNTER — Other Ambulatory Visit: Payer: Self-pay

## 2020-11-09 NOTE — Patient Outreach (Signed)
  [facility]) Care Management Chronic Special Needs Program  11/09/2020  Name: FREDERIK STANDLEY DOB: [birth year]  MRN: [id number]  Mr. Mamoru Takeshita is enrolled in a chronic special needs plan for Diabetes. Client called with no answer No answer and HIPAA compliant message left. 1st attempt Plan for 2nd outreach call in one week Chronic care management coordinator will attempt outreach in 1 week.   Peter Garter RN, Jackquline Denmark, CDE Chronic Care Management Coordinator [facility] Management [phone]

## 2020-11-11 DIAGNOSIS — M6281 Muscle weakness (generalized): Secondary | ICD-10-CM | POA: Diagnosis not present

## 2020-11-11 DIAGNOSIS — I69991 Dysphagia following unspecified cerebrovascular disease: Secondary | ICD-10-CM | POA: Diagnosis not present

## 2020-11-11 DIAGNOSIS — I639 Cerebral infarction, unspecified: Secondary | ICD-10-CM | POA: Diagnosis not present

## 2020-11-13 DIAGNOSIS — I69991 Dysphagia following unspecified cerebrovascular disease: Secondary | ICD-10-CM | POA: Diagnosis not present

## 2020-11-13 DIAGNOSIS — I639 Cerebral infarction, unspecified: Secondary | ICD-10-CM | POA: Diagnosis not present

## 2020-11-13 DIAGNOSIS — M6281 Muscle weakness (generalized): Secondary | ICD-10-CM | POA: Diagnosis not present

## 2020-11-14 ENCOUNTER — Ambulatory Visit: Payer: HMO | Admitting: Neurology

## 2020-11-14 ENCOUNTER — Other Ambulatory Visit: Payer: Self-pay

## 2020-11-14 ENCOUNTER — Encounter: Payer: Self-pay | Admitting: Neurology

## 2020-11-14 VITALS — BP 120/67 | HR 52 | Ht 70.0 in | Wt 200.0 lb

## 2020-11-14 DIAGNOSIS — E1142 Type 2 diabetes mellitus with diabetic polyneuropathy: Secondary | ICD-10-CM

## 2020-11-14 DIAGNOSIS — R269 Unspecified abnormalities of gait and mobility: Secondary | ICD-10-CM

## 2020-11-14 DIAGNOSIS — I639 Cerebral infarction, unspecified: Secondary | ICD-10-CM

## 2020-11-14 NOTE — Patient Outreach (Signed)
  [facility]) Care Management Chronic Special Needs Program  11/14/2020  Name: [name] DOB: Nov 14, 1934  MRN: [id number]  Mr. [name] is enrolled in a chronic special needs plan for Diabetes. Client called with no answer No answer and HIPAA compliant message left. 2nd attempt Plan for 3rd outreach call in one week Chronic care management coordinator will attempt outreach in 1 week.   Peter Garter RN, Jackquline Denmark, CDE Chronic Care Management Coordinator [facility] Management [phone]

## 2020-11-14 NOTE — Progress Notes (Signed)
Reason for visit: Stroke, memory problems  Referring physician: [facility]  [name] is a 86 y.o. male  History of present illness:  [name] is an 86 year old right-handed white male with a history of a memory disturbance and a diabetic peripheral neuropathy followed through this office.  He has a chronic gait disorder and a history of chronic renal sufficiency and hypothyroidism and prior abdominal aortic aneurysm repair.  The patient was admitted to the hospital on 19 August 2020 with a 1 day history of slurred speech and left-sided weakness.  The patient had fallen multiple times on the day prior to admission.  He was brought in for evaluation was noted to have a right thalamic capsular lacunar stroke.  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  The patient was placed on a combination of aspirin and Plavix for 6 weeks to be converted to Plavix alone after that.  The patient has been residing in assisted living at [facility].  The patient is getting physical and occupational therapy.  The is now able to walk with a walker, he does require some assistance with bathing and dressing.  He has significant swelling of both lower extremities.  He denies any problems with swallowing, his left-sided strength has improved some but has not returned to normal.  He has no numbness on the left side.  He denies any headaches or dizziness or vision changes or any problems controlling the bowels or the bladder.  He has not had a change in memory following the stroke.  He comes to this office for further evaluation.  His blood pressures have been well maintained, he is running systolic blood pressures in the 120s.  Past Medical History:  Diagnosis Date  . Bilateral foot-drop 01/12/2020  . Coronary artery disease   . Diabetes mellitus without complication ([place])   . Diabetic neuropathy ([place])  09/16/2017   Mild bilateral foot drops  . Gait abnormality 09/16/2017  . Hyperlipidemia   . Hypertension   . Memory difficulty 03/06/2017  . Stroke ([place]) 08/18/2020  . Toxic goiter     Past Surgical History:  Procedure Laterality Date  . ABDOMINAL AORTIC ANEURYSM REPAIR    . cardiac bypass    . CORONARY ARTERY BYPASS GRAFT      Family History  Problem Relation Age of Onset  . Stroke Mother   . Stroke Father   . Heart attack Father   . Diabetes Sister   . Heart attack Sister   . Diabetes Brother   . Heart attack Brother   . Diabetes Brother   . Diabetes Brother   . Heart attack Brother     Social history:  reports that he quit smoking about 24 years ago. He smoked 1.00 pack per day. He has never used smokeless tobacco. He reports that he does not drink alcohol and does not use drugs.  Medications:  Prior to Admission medications   Medication Sig Start Date End Date Taking? Authorizing Provider  acetaminophen (TYLENOL) 325 MG tablet Take 650 mg by mouth every 6 (six) hours as needed for mild pain, fever or headache.    [provider]  atorvastatin (LIPITOR) 40 MG tablet Take 40 mg by mouth every evening.     [provider]  calcitRIOL (ROCALTROL) 0.25 MCG capsule Take 0.25 mcg by mouth in the morning and at bedtime.  04/29/17   [provider]  clopidogrel (PLAVIX) 75 MG tablet Take 1 tablet (75 mg total) by mouth daily. 08/25/20   Bonnielee Haff, MD  donepezil (ARICEPT) 10 MG tablet TAKE 1 TABLET BY MOUTH ONCE DAILY AT BEDTIME Patient taking differently: Take 10 mg by mouth at bedtime.  01/10/20   Kathrynn Ducking, MD  DULoxetine (CYMBALTA) 30 MG capsule Take 1 capsule (30 mg total) by mouth at bedtime. 01/12/20   Kathrynn Ducking, MD  furosemide (LASIX) 80 MG tablet Take 0.5 tablets (40 mg total) by mouth daily. 08/30/20   Bonnielee Haff, MD  insulin aspart (NOVOLOG) 100 UNIT/ML injection Inject 0-15 Units into the skin 3 (three) times daily before  meals. Sliding scale    [provider]  insulin glargine (LANTUS SOLOSTAR) 100 UNIT/ML Solostar Pen Inject 25 Units into the skin daily. 08/30/20   Bonnielee Haff, MD  ketoconazole (NIZORAL) 2 % cream Apply 1 application topically daily.    [provider]  levothyroxine (SYNTHROID, LEVOTHROID) 75 MCG tablet Take 75 mcg by mouth daily before breakfast.    [provider]  loratadine (CLARITIN) 10 MG tablet Take 10 mg by mouth daily as needed for allergies.     [provider]  metoCLOPramide (REGLAN) 10 MG tablet Take 1 tablet (10 mg total) by mouth every 6 (six) hours as needed for nausea (nausea/headache). 09/20/20   Deno Etienne, DO  metoprolol succinate (TOPROL-XL) 25 MG 24 hr tablet Take 1 tablet (25 mg total) by mouth daily. Please keep upcoming appt in April with Dr. Tamala Julian before anymore refills. Thank you 03/20/20   Belva Crome, MD  Multiple Vitamins-Minerals (MULTIVITAMIN ADULTS PO) Take 1 tablet by mouth daily.    [provider]  niacin (NIASPAN) 1000 MG CR tablet Take 1,000 mg by mouth 2 (two) times daily.    [provider]  [facility] VERIO test strip  01/12/19   [provider]  potassium chloride (KLOR-CON) 10 MEQ tablet Take 1 tablet (10 mEq total) by mouth daily. 08/30/20   Bonnielee Haff, MD  RELION INSULIN SYRINGE 1ML/31G 31G X 5/16" 1 ML MISC USE AS DIRECTED FIVE TIMES DAILY 12/10/19   [provider]  saccharomyces boulardii (FLORASTOR) 250 MG capsule Take 1 capsule (250 mg total) by mouth 2 (two) times daily. 08/24/20   Bonnielee Haff, MD  sertraline (ZOLOFT) 50 MG tablet Take 50 mg by mouth daily.    [provider]  valACYclovir (VALTREX) 500 MG tablet Take 500 mg by mouth daily as needed (out break).     [provider]     No Known Allergies  ROS:  Out of a complete 14 system review of symptoms, the patient complains only of the following symptoms, and all other reviewed systems are  negative.  Walking difficulty, left-sided weakness Swelling in the legs  Blood pressure 120/67, pulse (!) 52, height 5\' 10"  (1.778 m), weight 200 lb (90.7 kg).  Physical Exam  General: The patient is alert and cooperative at the time of the examination.  Eyes: Pupils are equal, round, and reactive to light. Discs are flat bilaterally.  Neck: The neck is supple, no carotid bruits are noted.  Respiratory: The respiratory examination is clear.  Cardiovascular: The cardiovascular examination reveals a regular rate and rhythm, a grade III/VI systolic ejection murmur at the aortic area is noted with some radiation to the carotids.  Skin: Extremities are with 2-3+ edema below the knees bilaterally, some erythema is noted on the right greater than left lower  extremities.  Neurologic Exam  Mental status: The patient is alert and oriented x 3 at the time of the examination. The Mini-Mental status examination done today shows a total score 25/30.  Cranial nerves: Facial symmetry is present. There is good sensation of the face to pinprick and soft touch bilaterally. The strength of the facial muscles and the muscles to head turning and shoulder shrug are normal bilaterally. Speech is well enunciated, no aphasia or dysarthria is noted. Extraocular movements are full. Visual fields are full. The tongue is midline, and the patient has symmetric elevation of the soft palate. No obvious hearing deficits are noted.  Motor: The motor testing reveals 5 over 5 strength of all 4 extremities, with exception some slight weakness of the intrinsic muscles of the hand on the left, and 4/5 strength with hip flexion on the left. Good symmetric motor tone is noted throughout.  Sensory: Sensory testing is intact to pinprick, soft touch and vibration sensation on all 4 extremities. No evidence of extinction is noted.  Coordination: Cerebellar testing reveals good finger-nose-finger and heel-to-shin bilaterally.   Rapid alternating movements with the left upper extremity is somewhat decreased.  Gait and station: Gait is wide-based, the patient will walk with assistance.  Tandem gait is not attempted.  Reflexes: Deep tendon reflexes are symmetric, but are depressed bilaterally. Toes are downgoing bilaterally.   MRI brain 08/20/20:  IMPRESSION: 1. Acute/subacute 13 mm infarct involving the lateral right thalamus and internal capsule with small focus of central petechial hemorrhage. 2. Remote lacunar infarcts of the right globus pallidus and caudate. 3. Atrophy and white matter disease is stable. This likely reflects the sequela of chronic microvascular ischemia. 4. Left mastoid effusion. No obstructing nasopharyngeal lesion is present.  * MRI scan images were reviewed online. I agree with the written report.   MRA head 08/20/20:  IMPRESSION: 1. 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 2. No flow identified in the vertebral arteries. 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 CTA of the head and neck could be used for further evaluation if clinically indicated.   2D echo 08/20/20:  IMPRESSIONS    1. Left ventricular ejection fraction, by estimation, is 65 to 70%. The  left ventricle has normal function. The left ventricle has no regional  wall motion abnormalities. There is moderate concentric left ventricular  hypertrophy. Left ventricular  diastolic parameters are consistent with Grade I diastolic dysfunction  (impaired relaxation).  2. Right ventricular systolic function is normal. The right ventricular  size is normal.  3. The mitral valve is normal in structure. Mild mitral valve  regurgitation. No evidence of mitral stenosis.  4. The aortic valve is normal in structure. There is severe calcifcation  of the aortic valve. There is severe thickening of the aortic  valve.  Aortic valve regurgitation is mild. Mild aortic valve stenosis.  5. The inferior vena cava is normal in size with greater than 50%  respiratory variability, suggesting right atrial pressure of 3 mmHg.   Conclusion(s)/Recommendation(s): No intracardiac source of embolism  detected on this transthoracic study. A transesophageal echocardiogram is  recommended to exclude cardiac source of embolism if clinically indicated.   Carotid doppler 08/20/20:  Summary:  Right Carotid: Velocities in the right ICA are consistent with a 1-39%  stenosis.   Left Carotid: Velocities in the left ICA are consistent with a 1-39%  stenosis.   Vertebrals: Left vertebral artery  demonstrates antegrade flow. Right  vertebral        artery was not visualized.  Subclavians: Normal flow hemodynamics were seen in bilateral subclavian        arteries.    Assessment/Plan:  1.  Recent right thalamic capsular stroke  2.  Memory disturbance  3.  Diabetic peripheral neuropathy  4.  Gait disturbance  The patient has sustained a lacunar stroke that has resulted in some left hemiparesis.  The patient is slowly recovering, he is able to walk with a walker up to 400 feet he claims.  The patient is to be converted to Plavix alone if this has not already occurred.  The LDL cholesterol should be maintained at or below 70.  Systolic blood pressure should be maintained less than 883 systolic.  The patient will follow up here in 6 months, will follow his memory issues over time.  Jill Alexanders MD 11/14/2020 10:02 AM  [facility] [address] [place], [place] [address]  Phone [phone] Fax [phone]

## 2020-11-14 NOTE — Patient Instructions (Signed)
The patient is to be converted to plavix only, discontinue aspirin.  Keep LDL cholesterol at or below 70.

## 2020-11-15 DIAGNOSIS — E1140 Type 2 diabetes mellitus with diabetic neuropathy, unspecified: Secondary | ICD-10-CM | POA: Diagnosis not present

## 2020-11-15 DIAGNOSIS — E876 Hypokalemia: Secondary | ICD-10-CM | POA: Diagnosis not present

## 2020-11-15 DIAGNOSIS — N179 Acute kidney failure, unspecified: Secondary | ICD-10-CM | POA: Diagnosis not present

## 2020-11-15 DIAGNOSIS — F0280 Dementia in other diseases classified elsewhere without behavioral disturbance: Secondary | ICD-10-CM | POA: Diagnosis not present

## 2020-11-15 DIAGNOSIS — Z9181 History of falling: Secondary | ICD-10-CM | POA: Diagnosis not present

## 2020-11-15 DIAGNOSIS — I69318 Other symptoms and signs involving cognitive functions following cerebral infarction: Secondary | ICD-10-CM | POA: Diagnosis not present

## 2020-11-15 DIAGNOSIS — E1165 Type 2 diabetes mellitus with hyperglycemia: Secondary | ICD-10-CM | POA: Diagnosis not present

## 2020-11-15 DIAGNOSIS — F32A Depression, unspecified: Secondary | ICD-10-CM | POA: Diagnosis not present

## 2020-11-15 DIAGNOSIS — Z951 Presence of aortocoronary bypass graft: Secondary | ICD-10-CM | POA: Diagnosis not present

## 2020-11-15 DIAGNOSIS — I129 Hypertensive chronic kidney disease with stage 1 through stage 4 chronic kidney disease, or unspecified chronic kidney disease: Secondary | ICD-10-CM | POA: Diagnosis not present

## 2020-11-15 DIAGNOSIS — E1122 Type 2 diabetes mellitus with diabetic chronic kidney disease: Secondary | ICD-10-CM | POA: Diagnosis not present

## 2020-11-15 DIAGNOSIS — Z7902 Long term (current) use of antithrombotics/antiplatelets: Secondary | ICD-10-CM | POA: Diagnosis not present

## 2020-11-15 DIAGNOSIS — I2510 Atherosclerotic heart disease of native coronary artery without angina pectoris: Secondary | ICD-10-CM | POA: Diagnosis not present

## 2020-11-15 DIAGNOSIS — E785 Hyperlipidemia, unspecified: Secondary | ICD-10-CM | POA: Diagnosis not present

## 2020-11-15 DIAGNOSIS — G894 Chronic pain syndrome: Secondary | ICD-10-CM | POA: Diagnosis not present

## 2020-11-15 DIAGNOSIS — E039 Hypothyroidism, unspecified: Secondary | ICD-10-CM | POA: Diagnosis not present

## 2020-11-15 DIAGNOSIS — I69354 Hemiplegia and hemiparesis following cerebral infarction affecting left non-dominant side: Secondary | ICD-10-CM | POA: Diagnosis not present

## 2020-11-15 DIAGNOSIS — F419 Anxiety disorder, unspecified: Secondary | ICD-10-CM | POA: Diagnosis not present

## 2020-11-15 DIAGNOSIS — Z794 Long term (current) use of insulin: Secondary | ICD-10-CM | POA: Diagnosis not present

## 2020-11-15 DIAGNOSIS — N184 Chronic kidney disease, stage 4 (severe): Secondary | ICD-10-CM | POA: Diagnosis not present

## 2020-11-20 ENCOUNTER — Other Ambulatory Visit: Payer: Self-pay

## 2020-11-20 NOTE — Patient Outreach (Signed)
[facility]) Care Management Chronic Special Needs Program  11/20/2020  Name: [name] DOB: May 08, 1934  MRN: [id number]  Mr. Son Barkan is enrolled in a chronic special needs plan for  Diabetes.  Client called with no answer and HIPAA compliant message left.  3rd attempt A completed 2021 health risk assessment has not been received from the client and client has not responded to 3 outreach attempts by [facility] to complete 2021 Health Risk Assessment  The client's individualized care plan was developed based on available data and previous 2020 Health Risk Assessment Goals Addressed            This Visit's Progress   . Client understands the importance of follow-up with providers by attending scheduled visits   On track    Plan to keep scheduled appointments with providers    . Client will not report change from baseline and no repeated symptoms of stroke with in the next 6 months   Not on track    08/19/20 new stroke Know the signs and symptoms to prevent another stroke: Facial drooping (unable to smile), Arm weakness (one arm week or numb), Speech is difficult (slurred, unable to speak). Call 911, this is an emergency! Get to a hospital immediately.    . Client will report no worsening of symptoms related to heart disease within the next 6 months    On track    No ED visits or hospitalizations for heart disease  Notify provider for symptoms of chest pain, sweating, nausea/vomiting, irregular heartbeat, palpitations, rapid heart rate, shortness of breath or dizziness or fainting. Call 911 for severe symptoms of chest pain or shortness of breath. Follow a low salt, heart healthy diet     . Client will verbalize knowledge of self management of Hypertension as evidences by BP reading of 140/90 or less; or as defined by provider   On track    B/P 120/67 at last provider visit Plan to check B/P regularly Take B/P medications as ordered Plan to follow a low salt diet   Increase activity as tolerated     . Client/Caregiver will verbalize understanding of instructions related to self-care and safety   On track    Please call RNCM [phone] if you are having any difficulty caring for yourself    . General - Client will not be readmitted within 30 days (C-SNP)   On track    Discharged from skilled nursing home on 10/12/20 No readmission to hospital post 30 days discharge Please call 24 Hour nurse advice line as needed [phone]). Goal completed 11/20/20     . HEMOGLOBIN A1C < 7       Last Hemoglobin A1C 10.6% 08/21/20 Plan to check blood sugars as directed with goals fasting or 1 1/2 hours after eating with goal of 80-130 fasting and 180 or less after meals Plan to follow a low carbohydrate, low salt diet, watch portion sizes and avoid sugar sweetened drinks Plan to take your insulin as ordered Please review Diabetes Action plan in the [facility] [employer]([facility]) calendar    . COMPLETED: Obtain annual  Lipid Profile, LDL-C   On track    Last completed 08/21/20 LDL 96 The goal for LDL is less than 70 mg/dL as you are at high risk for complications Try to avoid saturated fats, trans-fats and eat more fiber Plan to take statin as ordered Goal completed 11/20/20    . Obtain Annual Eye (retinal)  Exam  Not on track    Your last documented eye exam was on 08/03/18  Diabetes can affect your vision. Plan to have a dilated eye exam every year    . Obtain annual screen for micro albuminuria (urine) , nephropathy (kidney problems)   Not on track    It is important for your doctor to check your urine for protein at least every year    . Obtain Hemoglobin A1C at least 2 times per year   On track    Last completed 08/21/20 It is important to have your Hemoglobin A1C checked every 6 months if you are at goal and every 3 months if you are not at goal    . Visit Primary Care Provider or Endocrinologist at least 2 times per year    On track    Last  Primary care visit 08/08/20 Neurology 11/14/20 Please schedule your annual wellness visit Not engaged with [facility]        Plan:  . Send unsuccessful outreach letter with a copy of individualized care plan to client . Send individualized care plan to provider  [facility] Management will continue to provide services for this client through 12/08/2020. The [facility] [employer] care management team will assume care 12/09/2020.  Client will be outreached by a [facility]) RNCM in 3-6 months per tier level  [place], [facility], [place] Management [phone]

## 2020-11-24 DIAGNOSIS — L03115 Cellulitis of right lower limb: Secondary | ICD-10-CM | POA: Diagnosis not present

## 2020-11-27 ENCOUNTER — Other Ambulatory Visit: Payer: Self-pay

## 2020-11-27 NOTE — Patient Outreach (Signed)
  [facility]) Care Management Chronic Special Needs Program    11/27/2020  Name: [name], DOB: [birth year]  MRN: [id number]   Mr. [name] is enrolled in a chronic special needs plan for Diabetes.  [place] Management will continue to provide services for this client through 12/08/2020. The [facility] [employer] care management team will assume care 12/09/2020.  Peter Garter RN, Jackquline Denmark, CDE Chronic Care Management Coordinator [facility] Care Management [phone]

## 2020-12-12 ENCOUNTER — Other Ambulatory Visit: Payer: Self-pay

## 2020-12-12 DIAGNOSIS — M6281 Muscle weakness (generalized): Secondary | ICD-10-CM | POA: Diagnosis not present

## 2020-12-12 DIAGNOSIS — I69991 Dysphagia following unspecified cerebrovascular disease: Secondary | ICD-10-CM | POA: Diagnosis not present

## 2020-12-12 DIAGNOSIS — I639 Cerebral infarction, unspecified: Secondary | ICD-10-CM | POA: Diagnosis not present

## 2020-12-15 DIAGNOSIS — R296 Repeated falls: Secondary | ICD-10-CM | POA: Diagnosis not present

## 2020-12-15 DIAGNOSIS — M7989 Other specified soft tissue disorders: Secondary | ICD-10-CM | POA: Diagnosis not present

## 2020-12-22 DIAGNOSIS — E119 Type 2 diabetes mellitus without complications: Secondary | ICD-10-CM | POA: Diagnosis not present

## 2020-12-22 DIAGNOSIS — Z794 Long term (current) use of insulin: Secondary | ICD-10-CM | POA: Diagnosis not present

## 2020-12-26 ENCOUNTER — Ambulatory Visit (INDEPENDENT_AMBULATORY_CARE_PROVIDER_SITE_OTHER): Payer: HMO | Admitting: Podiatry

## 2020-12-26 ENCOUNTER — Encounter: Payer: Self-pay | Admitting: Podiatry

## 2020-12-26 ENCOUNTER — Other Ambulatory Visit: Payer: Self-pay

## 2020-12-26 DIAGNOSIS — B351 Tinea unguium: Secondary | ICD-10-CM

## 2020-12-26 DIAGNOSIS — M79674 Pain in right toe(s): Secondary | ICD-10-CM | POA: Diagnosis not present

## 2020-12-26 DIAGNOSIS — E1159 Type 2 diabetes mellitus with other circulatory complications: Secondary | ICD-10-CM

## 2020-12-26 DIAGNOSIS — N184 Chronic kidney disease, stage 4 (severe): Secondary | ICD-10-CM | POA: Diagnosis not present

## 2020-12-26 DIAGNOSIS — M79675 Pain in left toe(s): Secondary | ICD-10-CM

## 2020-12-26 DIAGNOSIS — E1142 Type 2 diabetes mellitus with diabetic polyneuropathy: Secondary | ICD-10-CM

## 2020-12-26 NOTE — Progress Notes (Signed)
This patient returns to my office for at risk foot care.  This patient requires this care by a professional since this patient will be at risk due to having diabetic neuropathy and chronic kidney disease.  Patient has not been treated for over 1 year.    This patient is unable to cut nails himself since the patient cannot reach his nails.These nails are painful walking and wearing shoes.  This patient presents for at risk foot care today.  General Appearance  Alert, conversant and in no acute stress.  Vascular  Dorsalis pedis and posterior tibial  pulses are not  Palpable due to swelling   bilaterally.  Capillary return is within normal limits  bilaterally. Temperature is within normal limits  Bilaterally.  Venous stasis  B/L.  Neurologic  Senn-Weinstein monofilament wire test diminished   bilaterally. Muscle power within normal limits bilaterally.  Nails Thick disfigured discolored nails with subungual debris  from hallux to fifth toes bilaterally. No evidence of bacterial infection or drainage bilaterally.  Orthopedic  No limitations of motion  feet .  No crepitus or effusions noted.  No bony pathology or digital deformities noted.  Skin  normotropic skin with no porokeratosis noted bilaterally.  No signs of infections or ulcers noted.     Onychomycosis  Pain in right toes  Pain in left toes  Consent was obtained for treatment procedures.   Mechanical debridement of nails 1-5  bilaterally performed with a nail nipper.  Filed with dremel without incident.  Patient had nails debride by Dr.  Adah Perl and filed by Dr.  Prudence Davidson.   Return office visit    3 months                  Told patient to return for periodic foot care and evaluation due to potential at risk complications.   [name]

## 2021-01-09 DIAGNOSIS — E119 Type 2 diabetes mellitus without complications: Secondary | ICD-10-CM | POA: Diagnosis not present

## 2021-01-09 DIAGNOSIS — Z794 Long term (current) use of insulin: Secondary | ICD-10-CM | POA: Diagnosis not present

## 2021-01-11 DIAGNOSIS — I129 Hypertensive chronic kidney disease with stage 1 through stage 4 chronic kidney disease, or unspecified chronic kidney disease: Secondary | ICD-10-CM | POA: Diagnosis not present

## 2021-01-11 DIAGNOSIS — E1122 Type 2 diabetes mellitus with diabetic chronic kidney disease: Secondary | ICD-10-CM | POA: Diagnosis not present

## 2021-01-11 DIAGNOSIS — N2581 Secondary hyperparathyroidism of renal origin: Secondary | ICD-10-CM | POA: Diagnosis not present

## 2021-01-11 DIAGNOSIS — D631 Anemia in chronic kidney disease: Secondary | ICD-10-CM | POA: Diagnosis not present

## 2021-01-11 DIAGNOSIS — N184 Chronic kidney disease, stage 4 (severe): Secondary | ICD-10-CM | POA: Diagnosis not present

## 2021-01-11 DIAGNOSIS — N189 Chronic kidney disease, unspecified: Secondary | ICD-10-CM | POA: Diagnosis not present

## 2021-01-11 DIAGNOSIS — I4891 Unspecified atrial fibrillation: Secondary | ICD-10-CM | POA: Diagnosis not present

## 2021-01-11 DIAGNOSIS — Z8673 Personal history of transient ischemic attack (TIA), and cerebral infarction without residual deficits: Secondary | ICD-10-CM | POA: Diagnosis not present

## 2021-01-12 DIAGNOSIS — I639 Cerebral infarction, unspecified: Secondary | ICD-10-CM | POA: Diagnosis not present

## 2021-01-12 DIAGNOSIS — M6281 Muscle weakness (generalized): Secondary | ICD-10-CM | POA: Diagnosis not present

## 2021-01-12 DIAGNOSIS — I69991 Dysphagia following unspecified cerebrovascular disease: Secondary | ICD-10-CM | POA: Diagnosis not present

## 2021-01-16 DIAGNOSIS — I10 Essential (primary) hypertension: Secondary | ICD-10-CM | POA: Diagnosis not present

## 2021-01-16 DIAGNOSIS — E1165 Type 2 diabetes mellitus with hyperglycemia: Secondary | ICD-10-CM | POA: Diagnosis not present

## 2021-01-16 DIAGNOSIS — E890 Postprocedural hypothyroidism: Secondary | ICD-10-CM | POA: Diagnosis not present

## 2021-01-16 DIAGNOSIS — N189 Chronic kidney disease, unspecified: Secondary | ICD-10-CM | POA: Diagnosis not present

## 2021-01-23 DIAGNOSIS — R262 Difficulty in walking, not elsewhere classified: Secondary | ICD-10-CM | POA: Diagnosis not present

## 2021-01-23 DIAGNOSIS — R296 Repeated falls: Secondary | ICD-10-CM | POA: Diagnosis not present

## 2021-01-23 DIAGNOSIS — R2681 Unsteadiness on feet: Secondary | ICD-10-CM | POA: Diagnosis not present

## 2021-02-06 DIAGNOSIS — Z794 Long term (current) use of insulin: Secondary | ICD-10-CM | POA: Diagnosis not present

## 2021-02-06 DIAGNOSIS — E119 Type 2 diabetes mellitus without complications: Secondary | ICD-10-CM | POA: Diagnosis not present

## 2021-02-09 DIAGNOSIS — I639 Cerebral infarction, unspecified: Secondary | ICD-10-CM | POA: Diagnosis not present

## 2021-02-09 DIAGNOSIS — L089 Local infection of the skin and subcutaneous tissue, unspecified: Secondary | ICD-10-CM | POA: Diagnosis not present

## 2021-02-09 DIAGNOSIS — I69991 Dysphagia following unspecified cerebrovascular disease: Secondary | ICD-10-CM | POA: Diagnosis not present

## 2021-02-09 DIAGNOSIS — M6281 Muscle weakness (generalized): Secondary | ICD-10-CM | POA: Diagnosis not present

## 2021-02-21 ENCOUNTER — Encounter (HOSPITAL_COMMUNITY): Payer: Self-pay | Admitting: Nephrology

## 2021-02-21 ENCOUNTER — Inpatient Hospital Stay (HOSPITAL_COMMUNITY)
Admission: EM | Admit: 2021-02-21 | Discharge: 2021-02-24 | DRG: 184 | Disposition: A | Discharge status: Skilled Nursing Facility | Payer: HMO | Attending: Internal Medicine | Admitting: Internal Medicine

## 2021-02-21 ENCOUNTER — Emergency Department (HOSPITAL_COMMUNITY): Payer: HMO

## 2021-02-21 DIAGNOSIS — J341 Cyst and mucocele of nose and nasal sinus: Secondary | ICD-10-CM | POA: Diagnosis not present

## 2021-02-21 DIAGNOSIS — R109 Unspecified abdominal pain: Secondary | ICD-10-CM | POA: Diagnosis not present

## 2021-02-21 DIAGNOSIS — R600 Localized edema: Secondary | ICD-10-CM | POA: Diagnosis present

## 2021-02-21 DIAGNOSIS — W182XXA Fall in (into) shower or empty bathtub, initial encounter: Secondary | ICD-10-CM | POA: Diagnosis present

## 2021-02-21 DIAGNOSIS — I719 Aortic aneurysm of unspecified site, without rupture: Secondary | ICD-10-CM | POA: Diagnosis present

## 2021-02-21 DIAGNOSIS — F32A Depression, unspecified: Secondary | ICD-10-CM | POA: Diagnosis present

## 2021-02-21 DIAGNOSIS — E119 Type 2 diabetes mellitus without complications: Secondary | ICD-10-CM | POA: Diagnosis not present

## 2021-02-21 DIAGNOSIS — Z7902 Long term (current) use of antithrombotics/antiplatelets: Secondary | ICD-10-CM

## 2021-02-21 DIAGNOSIS — F0150 Vascular dementia without behavioral disturbance: Secondary | ICD-10-CM

## 2021-02-21 DIAGNOSIS — I69354 Hemiplegia and hemiparesis following cerebral infarction affecting left non-dominant side: Secondary | ICD-10-CM | POA: Diagnosis not present

## 2021-02-21 DIAGNOSIS — Z87891 Personal history of nicotine dependence: Secondary | ICD-10-CM

## 2021-02-21 DIAGNOSIS — I6930 Unspecified sequelae of cerebral infarction: Secondary | ICD-10-CM | POA: Diagnosis not present

## 2021-02-21 DIAGNOSIS — I129 Hypertensive chronic kidney disease with stage 1 through stage 4 chronic kidney disease, or unspecified chronic kidney disease: Secondary | ICD-10-CM | POA: Diagnosis present

## 2021-02-21 DIAGNOSIS — R110 Nausea: Secondary | ICD-10-CM | POA: Diagnosis not present

## 2021-02-21 DIAGNOSIS — E041 Nontoxic single thyroid nodule: Secondary | ICD-10-CM | POA: Diagnosis not present

## 2021-02-21 DIAGNOSIS — I708 Atherosclerosis of other arteries: Secondary | ICD-10-CM | POA: Diagnosis not present

## 2021-02-21 DIAGNOSIS — M7989 Other specified soft tissue disorders: Secondary | ICD-10-CM | POA: Diagnosis present

## 2021-02-21 DIAGNOSIS — E1122 Type 2 diabetes mellitus with diabetic chronic kidney disease: Secondary | ICD-10-CM | POA: Diagnosis present

## 2021-02-21 DIAGNOSIS — M21372 Foot drop, left foot: Secondary | ICD-10-CM | POA: Diagnosis present

## 2021-02-21 DIAGNOSIS — Z951 Presence of aortocoronary bypass graft: Secondary | ICD-10-CM | POA: Diagnosis not present

## 2021-02-21 DIAGNOSIS — S199XXA Unspecified injury of neck, initial encounter: Secondary | ICD-10-CM | POA: Diagnosis not present

## 2021-02-21 DIAGNOSIS — Z794 Long term (current) use of insulin: Secondary | ICD-10-CM

## 2021-02-21 DIAGNOSIS — K5730 Diverticulosis of large intestine without perforation or abscess without bleeding: Secondary | ICD-10-CM | POA: Diagnosis present

## 2021-02-21 DIAGNOSIS — E1165 Type 2 diabetes mellitus with hyperglycemia: Secondary | ICD-10-CM | POA: Diagnosis present

## 2021-02-21 DIAGNOSIS — I739 Peripheral vascular disease, unspecified: Secondary | ICD-10-CM | POA: Diagnosis not present

## 2021-02-21 DIAGNOSIS — H748X3 Other specified disorders of middle ear and mastoid, bilateral: Secondary | ICD-10-CM | POA: Diagnosis not present

## 2021-02-21 DIAGNOSIS — M21371 Foot drop, right foot: Secondary | ICD-10-CM | POA: Diagnosis present

## 2021-02-21 DIAGNOSIS — I10 Essential (primary) hypertension: Secondary | ICD-10-CM | POA: Diagnosis not present

## 2021-02-21 DIAGNOSIS — Y93E1 Activity, personal bathing and showering: Secondary | ICD-10-CM

## 2021-02-21 DIAGNOSIS — W19XXXA Unspecified fall, initial encounter: Secondary | ICD-10-CM | POA: Diagnosis not present

## 2021-02-21 DIAGNOSIS — Z886 Allergy status to analgesic agent status: Secondary | ICD-10-CM

## 2021-02-21 DIAGNOSIS — S2242XD Multiple fractures of ribs, left side, subsequent encounter for fracture with routine healing: Secondary | ICD-10-CM | POA: Diagnosis not present

## 2021-02-21 DIAGNOSIS — Z823 Family history of stroke: Secondary | ICD-10-CM | POA: Diagnosis not present

## 2021-02-21 DIAGNOSIS — E039 Hypothyroidism, unspecified: Secondary | ICD-10-CM | POA: Diagnosis present

## 2021-02-21 DIAGNOSIS — Z66 Do not resuscitate: Secondary | ICD-10-CM | POA: Diagnosis present

## 2021-02-21 DIAGNOSIS — I639 Cerebral infarction, unspecified: Secondary | ICD-10-CM | POA: Diagnosis not present

## 2021-02-21 DIAGNOSIS — I714 Abdominal aortic aneurysm, without rupture: Secondary | ICD-10-CM | POA: Diagnosis present

## 2021-02-21 DIAGNOSIS — Z833 Family history of diabetes mellitus: Secondary | ICD-10-CM

## 2021-02-21 DIAGNOSIS — R279 Unspecified lack of coordination: Secondary | ICD-10-CM | POA: Diagnosis not present

## 2021-02-21 DIAGNOSIS — G319 Degenerative disease of nervous system, unspecified: Secondary | ICD-10-CM | POA: Diagnosis not present

## 2021-02-21 DIAGNOSIS — I2510 Atherosclerotic heart disease of native coronary artery without angina pectoris: Secondary | ICD-10-CM | POA: Diagnosis present

## 2021-02-21 DIAGNOSIS — S2242XA Multiple fractures of ribs, left side, initial encounter for closed fracture: Secondary | ICD-10-CM | POA: Diagnosis present

## 2021-02-21 DIAGNOSIS — F0390 Unspecified dementia without behavioral disturbance: Secondary | ICD-10-CM | POA: Diagnosis present

## 2021-02-21 DIAGNOSIS — S3991XA Unspecified injury of abdomen, initial encounter: Secondary | ICD-10-CM | POA: Diagnosis not present

## 2021-02-21 DIAGNOSIS — R1110 Vomiting, unspecified: Secondary | ICD-10-CM | POA: Insufficient documentation

## 2021-02-21 DIAGNOSIS — Z95828 Presence of other vascular implants and grafts: Secondary | ICD-10-CM | POA: Diagnosis not present

## 2021-02-21 DIAGNOSIS — E785 Hyperlipidemia, unspecified: Secondary | ICD-10-CM | POA: Diagnosis present

## 2021-02-21 DIAGNOSIS — M545 Low back pain, unspecified: Secondary | ICD-10-CM | POA: Diagnosis present

## 2021-02-21 DIAGNOSIS — D696 Thrombocytopenia, unspecified: Secondary | ICD-10-CM | POA: Diagnosis present

## 2021-02-21 DIAGNOSIS — R296 Repeated falls: Secondary | ICD-10-CM | POA: Diagnosis present

## 2021-02-21 DIAGNOSIS — E1140 Type 2 diabetes mellitus with diabetic neuropathy, unspecified: Secondary | ICD-10-CM | POA: Diagnosis not present

## 2021-02-21 DIAGNOSIS — I6381 Other cerebral infarction due to occlusion or stenosis of small artery: Secondary | ICD-10-CM | POA: Diagnosis not present

## 2021-02-21 DIAGNOSIS — Z8249 Family history of ischemic heart disease and other diseases of the circulatory system: Secondary | ICD-10-CM

## 2021-02-21 DIAGNOSIS — S2249XA Multiple fractures of ribs, unspecified side, initial encounter for closed fracture: Secondary | ICD-10-CM | POA: Diagnosis present

## 2021-02-21 DIAGNOSIS — Z79890 Hormone replacement therapy (postmenopausal): Secondary | ICD-10-CM

## 2021-02-21 DIAGNOSIS — M47812 Spondylosis without myelopathy or radiculopathy, cervical region: Secondary | ICD-10-CM | POA: Diagnosis not present

## 2021-02-21 DIAGNOSIS — Z743 Need for continuous supervision: Secondary | ICD-10-CM | POA: Diagnosis not present

## 2021-02-21 DIAGNOSIS — I872 Venous insufficiency (chronic) (peripheral): Secondary | ICD-10-CM | POA: Diagnosis present

## 2021-02-21 DIAGNOSIS — H748X2 Other specified disorders of left middle ear and mastoid: Secondary | ICD-10-CM | POA: Diagnosis not present

## 2021-02-21 DIAGNOSIS — N184 Chronic kidney disease, stage 4 (severe): Secondary | ICD-10-CM | POA: Diagnosis present

## 2021-02-21 DIAGNOSIS — K3189 Other diseases of stomach and duodenum: Secondary | ICD-10-CM | POA: Diagnosis not present

## 2021-02-21 DIAGNOSIS — R112 Nausea with vomiting, unspecified: Secondary | ICD-10-CM | POA: Diagnosis present

## 2021-02-21 DIAGNOSIS — Z20822 Contact with and (suspected) exposure to covid-19: Secondary | ICD-10-CM | POA: Diagnosis present

## 2021-02-21 DIAGNOSIS — Z79899 Other long term (current) drug therapy: Secondary | ICD-10-CM

## 2021-02-21 DIAGNOSIS — K2970 Gastritis, unspecified, without bleeding: Secondary | ICD-10-CM | POA: Diagnosis not present

## 2021-02-21 DIAGNOSIS — R1111 Vomiting without nausea: Secondary | ICD-10-CM | POA: Diagnosis not present

## 2021-02-21 DIAGNOSIS — Z0389 Encounter for observation for other suspected diseases and conditions ruled out: Secondary | ICD-10-CM | POA: Diagnosis not present

## 2021-02-21 LAB — COMPREHENSIVE METABOLIC PANEL
ALT: 33 U/L (ref 0–44)
AST: 28 U/L (ref 15–41)
Albumin: 2.9 g/dL — ABNORMAL LOW (ref 3.5–5.0)
Alkaline Phosphatase: 63 U/L (ref 38–126)
Anion gap: 9 (ref 5–15)
BUN: 36 mg/dL — ABNORMAL HIGH (ref 8–23)
CO2: 24 mmol/L (ref 22–32)
Calcium: 9.5 mg/dL (ref 8.9–10.3)
Chloride: 104 mmol/L (ref 98–111)
Creatinine, Ser: 3.03 mg/dL — ABNORMAL HIGH (ref 0.61–1.24)
GFR, Estimated: 20 mL/min — ABNORMAL LOW (ref >60)
Glucose, Bld: 310 mg/dL — ABNORMAL HIGH (ref 70–99)
Potassium: 3.7 mmol/L (ref 3.5–5.1)
Sodium: 137 mmol/L (ref 135–145)
Total Bilirubin: 0.8 mg/dL (ref 0.3–1.2)
Total Protein: 5.5 g/dL — ABNORMAL LOW (ref 6.5–8.1)

## 2021-02-21 LAB — CBC
HCT: 41.7 % (ref 39.0–52.0)
Hemoglobin: 13.7 g/dL (ref 13.0–17.0)
MCH: 29.6 pg (ref 26.0–34.0)
MCHC: 32.9 g/dL (ref 30.0–36.0)
MCV: 90.1 fL (ref 80.0–100.0)
Platelets: 101 10*3/uL — ABNORMAL LOW (ref 150–400)
RBC: 4.63 MIL/uL (ref 4.22–5.81)
RDW: 13.1 % (ref 11.5–15.5)
WBC: 6.6 10*3/uL (ref 4.0–10.5)
nRBC: 0 % (ref 0.0–0.2)

## 2021-02-21 LAB — HEMOGLOBIN A1C
Hgb A1c MFr Bld: 10.1 % — ABNORMAL HIGH (ref 4.8–5.6)
Mean Plasma Glucose: 243.17 mg/dL

## 2021-02-21 LAB — RESP PANEL BY RT-PCR (FLU A&B, COVID) ARPGX2
Influenza A by PCR: NEGATIVE
Influenza B by PCR: NEGATIVE
SARS Coronavirus 2 by RT PCR: NEGATIVE

## 2021-02-21 LAB — LIPASE, BLOOD: Lipase: 77 U/L — ABNORMAL HIGH (ref 11–51)

## 2021-02-21 LAB — CBG MONITORING, ED: Glucose-Capillary: 310 mg/dL — ABNORMAL HIGH (ref 70–99)

## 2021-02-21 MED ORDER — ADULT MULTIVITAMIN W/MINERALS CH
1.0000 | ORAL_TABLET | Freq: Every day | ORAL | Status: DC
  Administered 2021-02-22 – 2021-02-24 (×4): 1 via ORAL
  Filled 2021-02-21 (×4): qty 1

## 2021-02-21 MED ORDER — NIACIN ER (ANTIHYPERLIPIDEMIC) 500 MG PO TBCR
1000.0000 mg | EXTENDED_RELEASE_TABLET | Freq: Two times a day (BID) | ORAL | Status: DC
  Administered 2021-02-22 – 2021-02-24 (×6): 1000 mg via ORAL
  Filled 2021-02-21 (×8): qty 2

## 2021-02-21 MED ORDER — GABAPENTIN 100 MG PO CAPS
100.0000 mg | ORAL_CAPSULE | Freq: Two times a day (BID) | ORAL | Status: DC
  Administered 2021-02-22 – 2021-02-24 (×6): 100 mg via ORAL
  Filled 2021-02-21 (×6): qty 1

## 2021-02-21 MED ORDER — ATORVASTATIN CALCIUM 40 MG PO TABS
40.0000 mg | ORAL_TABLET | Freq: Every day | ORAL | Status: DC
  Administered 2021-02-22 – 2021-02-24 (×3): 40 mg via ORAL
  Filled 2021-02-21 (×3): qty 1

## 2021-02-21 MED ORDER — ONDANSETRON HCL 4 MG/2ML IJ SOLN
4.0000 mg | Freq: Once | INTRAMUSCULAR | Status: AC
  Administered 2021-02-21: 4 mg via INTRAVENOUS
  Filled 2021-02-21: qty 2

## 2021-02-21 MED ORDER — MORPHINE SULFATE (PF) 4 MG/ML IV SOLN
4.0000 mg | Freq: Once | INTRAVENOUS | Status: AC
  Administered 2021-02-21: 4 mg via INTRAVENOUS
  Filled 2021-02-21: qty 1

## 2021-02-21 MED ORDER — ONDANSETRON HCL 4 MG PO TABS
4.0000 mg | ORAL_TABLET | Freq: Four times a day (QID) | ORAL | Status: DC | PRN
  Administered 2021-02-24: 4 mg via ORAL
  Filled 2021-02-21: qty 1

## 2021-02-21 MED ORDER — SODIUM CHLORIDE 0.9% FLUSH: 3.0000 mL | INTRAVENOUS | Status: DC | PRN

## 2021-02-21 MED ORDER — SODIUM CHLORIDE 0.9 % IV SOLN: 250.0000 mL | INTRAVENOUS | Status: DC | PRN

## 2021-02-21 MED ORDER — DULOXETINE HCL 30 MG PO CPEP
30.0000 mg | ORAL_CAPSULE | Freq: Every day | ORAL | Status: DC
  Administered 2021-02-22 – 2021-02-24 (×3): 30 mg via ORAL
  Filled 2021-02-21 (×3): qty 1

## 2021-02-21 MED ORDER — INSULIN ASPART 100 UNIT/ML ~~LOC~~ SOLN
0.0000 [IU] | Freq: Every day | SUBCUTANEOUS | Status: DC
  Administered 2021-02-22: 4 [IU] via SUBCUTANEOUS
  Administered 2021-02-22: 3 [IU] via SUBCUTANEOUS
  Administered 2021-02-23: 4 [IU] via SUBCUTANEOUS

## 2021-02-21 MED ORDER — ONDANSETRON HCL 4 MG/2ML IJ SOLN
4.0000 mg | Freq: Four times a day (QID) | INTRAMUSCULAR | Status: DC | PRN
  Administered 2021-02-22 (×2): 4 mg via INTRAVENOUS
  Filled 2021-02-21 (×2): qty 2

## 2021-02-21 MED ORDER — ENOXAPARIN SODIUM 30 MG/0.3ML ~~LOC~~ SOLN
30.0000 mg | SUBCUTANEOUS | Status: DC
  Administered 2021-02-21 – 2021-02-23 (×3): 30 mg via SUBCUTANEOUS
  Filled 2021-02-21 (×3): qty 0.3

## 2021-02-21 MED ORDER — TRIAMCINOLONE 0.1 % CREAM:EUCERIN CREAM 1:1
TOPICAL_CREAM | Freq: Two times a day (BID) | CUTANEOUS | Status: DC
  Filled 2021-02-21 (×2): qty 1

## 2021-02-21 MED ORDER — DONEPEZIL HCL 10 MG PO TABS
10.0000 mg | ORAL_TABLET | Freq: Every day | ORAL | Status: DC
  Administered 2021-02-22 – 2021-02-24 (×3): 10 mg via ORAL
  Filled 2021-02-21 (×3): qty 1

## 2021-02-21 MED ORDER — INSULIN GLARGINE 100 UNIT/ML ~~LOC~~ SOLN
20.0000 [IU] | Freq: Every day | SUBCUTANEOUS | Status: DC
  Administered 2021-02-22 (×2): 20 [IU] via SUBCUTANEOUS
  Filled 2021-02-21 (×3): qty 0.2

## 2021-02-21 MED ORDER — LIDOCAINE 5 % EX PTCH
1.0000 | MEDICATED_PATCH | CUTANEOUS | Status: DC
  Administered 2021-02-21 – 2021-02-23 (×3): 1 via TRANSDERMAL
  Filled 2021-02-21 (×3): qty 1

## 2021-02-21 MED ORDER — ACETAMINOPHEN 325 MG PO TABS
650.0000 mg | ORAL_TABLET | Freq: Four times a day (QID) | ORAL | Status: DC
  Administered 2021-02-21 – 2021-02-24 (×12): 650 mg via ORAL
  Filled 2021-02-21 (×12): qty 2

## 2021-02-21 MED ORDER — SODIUM CHLORIDE 0.9% FLUSH
3.0000 mL | Freq: Two times a day (BID) | INTRAVENOUS | Status: DC
  Administered 2021-02-22 – 2021-02-23 (×5): 3 mL via INTRAVENOUS

## 2021-02-21 MED ORDER — TRAMADOL HCL 50 MG PO TABS
50.0000 mg | ORAL_TABLET | Freq: Four times a day (QID) | ORAL | Status: DC | PRN
  Administered 2021-02-23 – 2021-02-24 (×2): 50 mg via ORAL
  Filled 2021-02-21 (×3): qty 1

## 2021-02-21 MED ORDER — CALCITRIOL 0.25 MCG PO CAPS
0.2500 ug | ORAL_CAPSULE | Freq: Every day | ORAL | Status: DC
  Administered 2021-02-22 – 2021-02-24 (×3): 0.25 ug via ORAL
  Filled 2021-02-21 (×3): qty 1

## 2021-02-21 MED ORDER — FUROSEMIDE 10 MG/ML IJ SOLN
40.0000 mg | Freq: Two times a day (BID) | INTRAMUSCULAR | Status: DC
  Administered 2021-02-21 – 2021-02-23 (×4): 40 mg via INTRAVENOUS
  Filled 2021-02-21 (×4): qty 4

## 2021-02-21 MED ORDER — METOCLOPRAMIDE HCL 5 MG PO TABS: 10.0000 mg | ORAL_TABLET | Freq: Four times a day (QID) | ORAL | Status: DC | PRN

## 2021-02-21 MED ORDER — SACCHAROMYCES BOULARDII 250 MG PO CAPS
250.0000 mg | ORAL_CAPSULE | Freq: Two times a day (BID) | ORAL | Status: DC
  Administered 2021-02-22 – 2021-02-24 (×6): 250 mg via ORAL
  Filled 2021-02-21 (×7): qty 1

## 2021-02-21 MED ORDER — FENTANYL CITRATE (PF) 100 MCG/2ML IJ SOLN
25.0000 ug | INTRAMUSCULAR | Status: DC | PRN
  Administered 2021-02-21 – 2021-02-22 (×2): 25 ug via INTRAVENOUS
  Filled 2021-02-21 (×2): qty 2

## 2021-02-21 MED ORDER — LEVOTHYROXINE SODIUM 75 MCG PO TABS
75.0000 ug | ORAL_TABLET | Freq: Every day | ORAL | Status: DC
  Administered 2021-02-22 – 2021-02-24 (×3): 75 ug via ORAL
  Filled 2021-02-21 (×3): qty 1

## 2021-02-21 MED ORDER — INSULIN ASPART 100 UNIT/ML ~~LOC~~ SOLN
0.0000 [IU] | Freq: Three times a day (TID) | SUBCUTANEOUS | Status: DC
  Administered 2021-02-22: 3 [IU] via SUBCUTANEOUS
  Administered 2021-02-22 (×2): 5 [IU] via SUBCUTANEOUS
  Administered 2021-02-23: 3 [IU] via SUBCUTANEOUS
  Administered 2021-02-23: 8 [IU] via SUBCUTANEOUS
  Administered 2021-02-24: 5 [IU] via SUBCUTANEOUS
  Administered 2021-02-24: 3 [IU] via SUBCUTANEOUS

## 2021-02-21 MED ORDER — SERTRALINE HCL 50 MG PO TABS
50.0000 mg | ORAL_TABLET | Freq: Every day | ORAL | Status: DC
  Administered 2021-02-22 – 2021-02-24 (×3): 50 mg via ORAL
  Filled 2021-02-21 (×3): qty 1

## 2021-02-21 MED ORDER — POLYETHYLENE GLYCOL 3350 17 G PO PACK: 17.0000 g | PACK | Freq: Every day | ORAL | Status: DC | PRN

## 2021-02-21 MED ORDER — METOPROLOL SUCCINATE ER 25 MG PO TB24
25.0000 mg | ORAL_TABLET | Freq: Every day | ORAL | Status: DC
  Administered 2021-02-23: 25 mg via ORAL
  Filled 2021-02-21 (×2): qty 1

## 2021-02-21 MED ORDER — METHOCARBAMOL 500 MG PO TABS
500.0000 mg | ORAL_TABLET | Freq: Three times a day (TID) | ORAL | Status: DC
  Administered 2021-02-21 – 2021-02-24 (×9): 500 mg via ORAL
  Filled 2021-02-21 (×9): qty 1

## 2021-02-21 NOTE — ED Notes (Signed)
Pt was changed out of clothes and into gown and placed a urinal at bedside.

## 2021-02-21 NOTE — Consult Note (Signed)
[name] 09-Jan-1934  [id number].    Requesting MD: Dr. Darl Householder Chief Complaint/Reason for Consult: [name]  HPI: [name] is a 87 y.o. male with a hx of prior tobacco use, CVA with left-sided weakness, HTN, HLD, DM2, CAD s/p CABG on Plavix who presented from an assisted living to the ED as a non-level trauma after a fall. Patient reportedly had a witnessed fall yesterday evening onto his left side after he slipped in the shower. No head trauma or loc reported. Mental state is baseline per his daughter who is at bedside. He complains of left chest wall and left flank pain. No other complaints. He underwent workup in the ED and was found to have left 11-12th rib fractures without PTX. CT head, CT cervical spine and A/P negative for acute pathology. TRH to admit. We were asked to see.    According to patient and his daughter he had a CVA in sep 2021, went to [facility], then moved to current assisted living facility In November 2021. At baseline he uses a wheelchair and walker for mobility. Spends most of his day in wheelchair. Per daughter he has had recurrent falls and they have been looking into a SNF facility and have submitted an application to a SNF.  ROS:  Review of Systems  Constitutional: Negative.   HENT: Negative.   Eyes: Negative.   Respiratory: Negative.   Cardiovascular: Positive for chest pain.  Gastrointestinal: Negative for abdominal pain, blood in stool, constipation, diarrhea, nausea and vomiting.  Genitourinary: Positive for urgency.       Some urinary incontinence at baseline  Musculoskeletal: Positive for falls.  Neurological: Positive for weakness (residual L sided weakness from CVA).  Endo/Heme/Allergies: Bruises/bleeds easily.  Psychiatric/Behavioral: Negative.   All other systems reviewed and are negative.   Family History  Problem Relation Age of Onset  . Stroke Mother   . Stroke Father   . Heart attack Father   . Diabetes Sister   . Heart attack Sister    . Diabetes Brother   . Heart attack Brother   . Diabetes Brother   . Diabetes Brother   . Heart attack Brother     Past Medical History:  Diagnosis Date  . Bilateral foot-drop 01/12/2020  . Coronary artery disease   . Diabetes mellitus without complication ([place])   . Diabetic neuropathy ([place]) 09/16/2017   Mild bilateral foot drops  . Gait abnormality 09/16/2017  . Hyperlipidemia   . Hypertension   . Memory difficulty 03/06/2017  . Stroke ([place]) 08/18/2020  . Toxic goiter     Past Surgical History:  Procedure Laterality Date  . ABDOMINAL AORTIC ANEURYSM REPAIR    . cardiac bypass    . CORONARY ARTERY BYPASS GRAFT      Social History:  reports that he quit smoking about 24 years ago. He smoked 1.00 pack per day. He has never used smokeless tobacco. He reports that he does not drink alcohol and does not use drugs.  Allergies: No Known Allergies  (Not in a hospital admission)    Physical Exam: Blood pressure (!) 137/98, pulse (!) 58, temperature 97.9 F (36.6 C), temperature source Oral, resp. rate 17, SpO2 100 %. General: pleasant, white male who is laying in bed in NAD HEENT: head is normocephalic, atraumatic.  Sclera are noninjected.  PERRL.  Ears and nose without any masses or lesions.  Mouth is pink and moist. Dentition fair Heart: regular, rate, and rhythm.  Normal s1,s2. No  obvious murmurs, gallops, or rubs noted.  Palpable pedal pulses bilaterally  Lungs: CTAB, no wheezes, rhonchi, or rales noted.  Respiratory effort nonlabored - inspiration causes pain. Left chest wall tenderness.  Abd: soft, NT/ND, +BS, no masses, or organomegaly, reducible umbilical hernia present MS: BLE edema, non-pitting, decreased grip strength LUE, L foot drop Skin: warm and dry with no masses, lesions, or rashes Psych: A&Ox4 with an appropriate affect Neuro: cranial nerves grossly intact, decreased strength LUE and LLE, normal speech, thought process intact, gait not assessed   Results for  orders placed or performed during the hospital encounter of 02/21/21 (from the past 48 hour(s))  Lipase, blood     Status: Abnormal   Collection Time: 02/21/21  1:12 PM  Result Value Ref Range   Lipase 77 (H) 11 - 51 U/L    Comment: Performed at [facility], [address], [place], [place] [address]  Comprehensive metabolic panel     Status: Abnormal   Collection Time: 02/21/21  1:12 PM  Result Value Ref Range   Sodium 137 135 - 145 mmol/L   Potassium 3.7 3.5 - 5.1 mmol/L   Chloride 104 98 - 111 mmol/L   CO2 24 22 - 32 mmol/L   Glucose, Bld 310 (H) 70 - 99 mg/dL    Comment: Glucose reference range applies only to samples taken after fasting for at least 8 hours.   BUN 36 (H) 8 - 23 mg/dL   Creatinine, Ser 3.03 (H) 0.61 - 1.24 mg/dL   Calcium 9.5 8.9 - 10.3 mg/dL   Total Protein 5.5 (L) 6.5 - 8.1 g/dL   Albumin 2.9 (L) 3.5 - 5.0 g/dL   AST 28 15 - 41 U/L   ALT 33 0 - 44 U/L   Alkaline Phosphatase 63 38 - 126 U/L   Total Bilirubin 0.8 0.3 - 1.2 mg/dL   GFR, Estimated 20 (L) >60 mL/min    Comment: (NOTE) Calculated using the CKD-EPI Creatinine Equation (2021)    Anion gap 9 5 - 15    Comment: Performed at [facility], [place] [address] [place], [place] [address]  CBC     Status: Abnormal   Collection Time: 02/21/21  1:12 PM  Result Value Ref Range   WBC 6.6 4.0 - 10.5 K/uL   RBC 4.63 4.22 - 5.81 MIL/uL   Hemoglobin 13.7 13.0 - 17.0 g/dL   HCT 41.7 39.0 - 52.0 %   MCV 90.1 80.0 - 100.0 fL   MCH 29.6 26.0 - 34.0 pg   MCHC 32.9 30.0 - 36.0 g/dL   RDW 13.1 11.5 - 15.5 %   Platelets 101 (L) 150 - 400 K/uL    Comment: REPEATED TO VERIFY PLATELET COUNT CONFIRMED BY SMEAR    nRBC 0.0 0.0 - 0.2 %    Comment: Performed at [facility], [place] [address], [place], [place] [address]   CT ABDOMEN PELVIS WO CONTRAST  Result Date: 02/21/2021 CLINICAL DATA:  Left flank pain after fall in shower EXAM: CT CHEST, ABDOMEN AND PELVIS WITHOUT CONTRAST TECHNIQUE: Multidetector  CT imaging of the chest, abdomen and pelvis was performed following the standard protocol without IV contrast. COMPARISON:  09/20/2020 FINDINGS: CT CHEST FINDINGS Cardiovascular: Heart size is normal. No pericardial effusion. Thoracic aorta is nonaneurysmal. Atherosclerotic calcifications of the aorta and coronary arteries. Borderline enlargement of the main pulmonary trunk. Post CABG changes. Mediastinum/Nodes: No axillary, mediastinal, or hilar lymphadenopathy. Left thyroid lobe nodule, which has been  previously biopsied. Trachea and esophagus within normal limits. Lungs/Pleura: Lungs are clear. No airspace consolidation, pleural effusion, or pneumothorax. Musculoskeletal: Acute comminuted mildly displaced fracture of the posterior left twelfth rib. Acute minimally displaced fracture of the posterior left eleventh rib. Thoracic vertebral body heights and alignment are maintained without fracture or static listhesis. No chest wall abnormality. CT ABDOMEN PELVIS FINDINGS Hepatobiliary: Unenhanced liver appears within normal limits. No evidence of a Paddock laceration or perihepatic hematoma. Layering sludge or small stones within the gallbladder lumen. No pericholecystic inflammatory changes by CT. Pancreas: Unremarkable. No pancreatic ductal dilatation or surrounding inflammatory changes. Spleen: No evidence of splenic injury or perisplenic hematoma. Adrenals/Urinary Tract: Adrenal glands are unremarkable. Kidneys are normal, without renal calculi, focal lesion, or hydronephrosis. Bladder is unremarkable. Stomach/Bowel: Small sliding hiatal hernia. Diffuse gastric wall thickening. No dilated loops of bowel to suggest obstruction. Scattered colonic diverticulosis. No focal bowel wall thickening or inflammatory changes are seen. Vascular/Lymphatic: Aortoiliac stent graft remains in place. Native infrarenal abdominal aortic aneurysm sac is stable in size measuring approximately 3.4 cm in AP dimension. No  abdominopelvic lymphadenopathy. Reproductive: Mildly enlarged prostate gland. Other: No free fluid. No abdominopelvic fluid collection. No pneumoperitoneum. Tiny fat containing umbilical hernia. Musculoskeletal: No acute osseous abnormality. Pelvic bony ring intact. Bilateral hips intact without fracture or dislocation. IMPRESSION: 1. Acute comminuted mildly displaced fracture of the posterior left twelfth rib. Acute minimally displaced fracture of the posterior left eleventh rib. No pneumothorax. 2. No evidence of acute traumatic injury within the abdomen or pelvis. 3. Diffuse gastric wall thickening. Correlate for signs and symptoms of gastritis and consider follow-up with endoscopy. 4. Layering sludge or small stones within the gallbladder lumen. No pericholecystic inflammatory changes by CT. 5. Colonic diverticulosis without evidence of acute diverticulitis. 6. Aortoiliac stent graft remains in place. Native infrarenal abdominal aortic aneurysm sac is stable in size measuring approximately 3.4 cm in AP dimension. 7. Aortic atherosclerosis (ICD10-I70.0). Electronically Signed   By: Davina Poke D.O.   On: 02/21/2021 15:25   CT Head Wo Contrast  Result Date: 02/21/2021 CLINICAL DATA:  Fall in the shower.  Vomiting. EXAM: CT HEAD WITHOUT CONTRAST TECHNIQUE: Contiguous axial images were obtained from the base of the skull through the vertex without intravenous contrast. COMPARISON:  Head CT 08/19/2020 FINDINGS: Brain: Stable degree of atrophy and chronic small vessel ischemia. No intracranial hemorrhage, mass effect, or midline shift. No hydrocephalus. The basilar cisterns are patent. Remote lacunar infarcts in the right basal ganglia and thalamus. No evidence of territorial infarct or acute ischemia. No extra-axial or intracranial fluid collection. Vascular: No hyperdense vessel or unexpected calcification. Skull: No fracture or focal lesion. Sinuses/Orbits: No acute findings. Minimal opacification of lower  left mastoid air cells unchanged from prior exam. Small mucous retention cyst and mucosal thickening of left maxillary sinus. Other: None. IMPRESSION: 1. No acute intracranial abnormality. No skull fracture. 2. Stable atrophy and chronic small vessel ischemia. Remote lacunar infarcts in the right basal ganglia and thalamus. Electronically Signed   By: Keith Rake M.D.   On: 02/21/2021 15:19   CT Chest Wo Contrast  Result Date: 02/21/2021 CLINICAL DATA:  Left flank pain after fall in shower EXAM: CT CHEST, ABDOMEN AND PELVIS WITHOUT CONTRAST TECHNIQUE: Multidetector CT imaging of the chest, abdomen and pelvis was performed following the standard protocol without IV contrast. COMPARISON:  09/20/2020 FINDINGS: CT CHEST FINDINGS Cardiovascular: Heart size is normal. No pericardial effusion. Thoracic aorta is nonaneurysmal. Atherosclerotic calcifications of the aorta and coronary arteries.  Borderline enlargement of the main pulmonary trunk. Post CABG changes. Mediastinum/Nodes: No axillary, mediastinal, or hilar lymphadenopathy. Left thyroid lobe nodule, which has been previously biopsied. Trachea and esophagus within normal limits. Lungs/Pleura: Lungs are clear. No airspace consolidation, pleural effusion, or pneumothorax. Musculoskeletal: Acute comminuted mildly displaced fracture of the posterior left twelfth rib. Acute minimally displaced fracture of the posterior left eleventh rib. Thoracic vertebral body heights and alignment are maintained without fracture or static listhesis. No chest wall abnormality. CT ABDOMEN PELVIS FINDINGS Hepatobiliary: Unenhanced liver appears within normal limits. No evidence of a Paddock laceration or perihepatic hematoma. Layering sludge or small stones within the gallbladder lumen. No pericholecystic inflammatory changes by CT. Pancreas: Unremarkable. No pancreatic ductal dilatation or surrounding inflammatory changes. Spleen: No evidence of splenic injury or perisplenic  hematoma. Adrenals/Urinary Tract: Adrenal glands are unremarkable. Kidneys are normal, without renal calculi, focal lesion, or hydronephrosis. Bladder is unremarkable. Stomach/Bowel: Small sliding hiatal hernia. Diffuse gastric wall thickening. No dilated loops of bowel to suggest obstruction. Scattered colonic diverticulosis. No focal bowel wall thickening or inflammatory changes are seen. Vascular/Lymphatic: Aortoiliac stent graft remains in place. Native infrarenal abdominal aortic aneurysm sac is stable in size measuring approximately 3.4 cm in AP dimension. No abdominopelvic lymphadenopathy. Reproductive: Mildly enlarged prostate gland. Other: No free fluid. No abdominopelvic fluid collection. No pneumoperitoneum. Tiny fat containing umbilical hernia. Musculoskeletal: No acute osseous abnormality. Pelvic bony ring intact. Bilateral hips intact without fracture or dislocation. IMPRESSION: 1. Acute comminuted mildly displaced fracture of the posterior left twelfth rib. Acute minimally displaced fracture of the posterior left eleventh rib. No pneumothorax. 2. No evidence of acute traumatic injury within the abdomen or pelvis. 3. Diffuse gastric wall thickening. Correlate for signs and symptoms of gastritis and consider follow-up with endoscopy. 4. Layering sludge or small stones within the gallbladder lumen. No pericholecystic inflammatory changes by CT. 5. Colonic diverticulosis without evidence of acute diverticulitis. 6. Aortoiliac stent graft remains in place. Native infrarenal abdominal aortic aneurysm sac is stable in size measuring approximately 3.4 cm in AP dimension. 7. Aortic atherosclerosis (ICD10-I70.0). Electronically Signed   By: Davina Poke D.O.   On: 02/21/2021 15:25   CT Cervical Spine Wo Contrast  Result Date: 02/21/2021 CLINICAL DATA:  [place] in shower, nausea, vomiting, pain, neck trauma, initial encounter EXAM: CT CERVICAL SPINE WITHOUT CONTRAST TECHNIQUE: Multidetector CT imaging of  the cervical spine was performed without intravenous contrast. Multiplanar CT image reconstructions were also generated. COMPARISON:  03/18/2020 FINDINGS: Alignment: Normal Skull base and vertebrae: Slight osseous demineralization. Scattered disc space narrowing and endplate spur formation. Multilevel facet degenerative changes cervical spine. Vertebral body heights maintained without fracture, subluxation, or bone destruction. Skull base intact. Soft tissues and spinal canal: Prevertebral soft tissues normal thickness. Atherosclerotic calcifications in the carotid systems, proximal great vessels, and at cranial aspect of aortic arch. Large LEFT lobe thyroid nodule 3.0 cm diameter, unchanged since prior exam as well as prior thyroid ultrasound of 2011; this previously underwent FNA under ultrasound guidance, no further workup recommended. This has been evaluated on previous imaging. (ref: J Am Coll Radiol. 2015 Feb;12(2): 143-50). Disc levels: Scattered endplate spur formation. No focal disc abnormalities. AP narrowing of spinal canal by endplate spurs and accompanying mild disc bulges. Upper chest: Lung apices clear Other: N/A IMPRESSION: Multilevel degenerative disc and facet disease changes of the cervical spine as above. No acute cervical spine abnormalities. Aortic Atherosclerosis (ICD10-I70.0). Electronically Signed   By: Lavonia Dana M.D.   On: 02/21/2021 15:41  Anti-infectives (From admission, onward)   None     Assessment/Plan Fall Left 11-12th rib fractures - CT without PTX. Multimodal pain control. Pulm toilet. PT/OT. CXR In AM. MMP including CVA, HTN, HLD, DM2, CAD on Plavix, CKD (Cr 3.03) - Per TRH  Elevated Lipase - Lipase 77. No pancreatitis changes noted on CT FEN - Okay for diet from our standpoint  VTE - SCDs, okay for chemical prophylaxis from our standpoint ID - None indicated from a trauma standpoint  Obie Dredge, [facility] 02/21/2021, 4:14 PM Please see  Amion for pager number during day hours 7:00am-4:30pm

## 2021-02-21 NOTE — ED Provider Notes (Signed)
[place] EMERGENCY DEPARTMENT Provider Note   CSN: [id number] Arrival date & time: 02/21/21  1237     History Chief Complaint  Patient presents with  . Nausea  . Vomiting    [name] is a 87 y.o. male.  Patient with history of diabetes, neuropathy, foot drop, stroke, kidney disease stage IV, dementia, currently in assisted living presents after witnessed fall yesterday evening.  Patient fell left side, general weakness.  Patient had worsening pain in the left flank and nausea and vomiting this morning.  No witnessed head injury.  Patient at baseline mentally.  Patient on Plavix.        Past Medical History:  Diagnosis Date  . Bilateral foot-drop 01/12/2020  . Coronary artery disease   . Diabetes mellitus without complication ([place])   . Diabetic neuropathy ([place]) 09/16/2017   Mild bilateral foot drops  . Gait abnormality 09/16/2017  . Hyperlipidemia   . Hypertension   . Memory difficulty 03/06/2017  . Stroke ([place]) 08/18/2020  . Toxic goiter     Patient Active Problem List   Diagnosis Date Noted  . CKD (chronic kidney disease), stage III ([place]) 08/20/2020  . Ischemic stroke ([place]) 08/20/2020  . Thrombocytopenia ([place]) 08/20/2020  . Hypothyroidism 08/20/2020  . Dementia ([place]) 08/20/2020  . Depression 08/20/2020  . Cellulitis 08/20/2020  . Bilateral foot-drop 01/12/2020  . Diabetic neuropathy ([place]) 09/16/2017  . Gait abnormality 09/16/2017  . Memory difficulty 03/06/2017  . GIB (gastrointestinal bleeding) 03/31/2016  . Rectal bleeding   . Chronic kidney disease, stage IV (severe) ([place]) 03/23/2015  . Coronary artery disease 03/22/2014  . Essential hypertension 03/22/2014  . Hyperlipidemia 03/22/2014  . Multinodular goiter 03/22/2014  . DM (diabetes mellitus) ([place]) 03/22/2014  . Abdominal aortic aneurysm ([place]) 03/04/2012    Past Surgical History:  Procedure Laterality Date  . ABDOMINAL AORTIC ANEURYSM REPAIR    . cardiac bypass    . CORONARY  ARTERY BYPASS GRAFT         Family History  Problem Relation Age of Onset  . Stroke Mother   . Stroke Father   . Heart attack Father   . Diabetes Sister   . Heart attack Sister   . Diabetes Brother   . Heart attack Brother   . Diabetes Brother   . Diabetes Brother   . Heart attack Brother     Social History   Tobacco Use  . Smoking status: Former Smoker    Packs/day: 1.00    Quit date: 03/31/1996    Years since quitting: 24.9  . Smokeless tobacco: Never Used  Vaping Use  . Vaping Use: Never used  Substance Use Topics  . Alcohol use: No  . Drug use: No    Home Medications Prior to Admission medications   Medication Sig Start Date End Date Taking? Authorizing Provider  acetaminophen (TYLENOL) 325 MG tablet Take 650 mg by mouth every 6 (six) hours as needed for mild pain, fever or headache.    [provider]  atorvastatin (LIPITOR) 40 MG tablet Take 40 mg by mouth every evening.     [provider]  calcitRIOL (ROCALTROL) 0.25 MCG capsule Take 0.25 mcg by mouth in the morning and at bedtime.  04/29/17   [provider]  clopidogrel (PLAVIX) 75 MG tablet Take 1 tablet (75 mg total) by mouth daily. 08/25/20   Bonnielee Haff, MD  donepezil (ARICEPT) 10 MG tablet TAKE 1 TABLET BY MOUTH ONCE DAILY AT BEDTIME Patient taking  differently: Take 10 mg by mouth at bedtime.  01/10/20   Kathrynn Ducking, MD  doxycycline (VIBRAMYCIN) 100 MG capsule 1 capsule 11/24/20   [provider]  DULoxetine (CYMBALTA) 30 MG capsule Take 1 capsule (30 mg total) by mouth at bedtime. 01/12/20   Kathrynn Ducking, MD  furosemide (LASIX) 80 MG tablet Take 0.5 tablets (40 mg total) by mouth daily. 08/30/20   Bonnielee Haff, MD  gabapentin (NEURONTIN) 100 MG capsule 1 capsule    [provider]  insulin aspart (NOVOLOG) 100 UNIT/ML injection Inject 0-15 Units into the skin 3 (three) times daily before meals. Sliding scale    [provider]  insulin  glargine (LANTUS SOLOSTAR) 100 UNIT/ML Solostar Pen Inject 25 Units into the skin daily. 08/30/20   Bonnielee Haff, MD  ketoconazole (NIZORAL) 2 % cream Apply 1 application topically daily.    [provider]  levothyroxine (SYNTHROID, LEVOTHROID) 75 MCG tablet Take 75 mcg by mouth daily before breakfast.    [provider]  loratadine (CLARITIN) 10 MG tablet Take 10 mg by mouth daily as needed for allergies.     [provider]  metoCLOPramide (REGLAN) 10 MG tablet Take 1 tablet (10 mg total) by mouth every 6 (six) hours as needed for nausea (nausea/headache). 09/20/20   Deno Etienne, DO  metoprolol succinate (TOPROL-XL) 25 MG 24 hr tablet Take 1 tablet (25 mg total) by mouth daily. Please keep upcoming appt in April with Dr. Tamala Julian before anymore refills. Thank you 03/20/20   Belva Crome, MD  Multiple Vitamins-Minerals (MULTIVITAMIN ADULTS PO) Take 1 tablet by mouth daily.    [provider]  niacin (NIASPAN) 1000 MG CR tablet Take 1,000 mg by mouth 2 (two) times daily.    [provider]  ondansetron (ZOFRAN) 8 MG tablet 0.5 tablet as needed    [provider]  ONETOUCH VERIO test strip  01/12/19   [provider]  polyethylene glycol (MIRALAX) 17 g packet 1 packet mixed with 8 ounces of fluid    [provider]  potassium chloride (KLOR-CON) 10 MEQ tablet Take 1 tablet (10 mEq total) by mouth daily. 08/30/20   Bonnielee Haff, MD  RELION INSULIN SYRINGE 1ML/31G 31G X 5/16" 1 ML MISC USE AS DIRECTED FIVE TIMES DAILY 12/10/19   [provider]  saccharomyces boulardii (FLORASTOR) 250 MG capsule Take 1 capsule (250 mg total) by mouth 2 (two) times daily. 08/24/20   Bonnielee Haff, MD  sertraline (ZOLOFT) 50 MG tablet Take 50 mg by mouth daily.    [provider]  valACYclovir (VALTREX) 500 MG tablet Take 500 mg by mouth daily as needed (out break).     [provider]    Allergies    Patient has no known  allergies.  Review of Systems   Review of Systems  Constitutional: Positive for fatigue. Negative for chills and fever.  HENT: Negative for congestion.   Eyes: Negative for visual disturbance.  Respiratory: Negative for shortness of breath.   Cardiovascular: Negative for chest pain.  Gastrointestinal: Positive for nausea and vomiting. Negative for abdominal pain.  Genitourinary: Positive for flank pain. Negative for dysuria.  Musculoskeletal: Positive for back pain. Negative for neck pain and neck stiffness.  Skin: Negative for rash.  Neurological: Negative for light-headedness and headaches.  Hematological: Bruises/bleeds easily.    Physical Exam Updated Vital Signs BP (!) 137/98 (BP Location: Left Arm)   Pulse (!) 58   Temp 97.9 F (36.6  C) (Oral)   Resp 17   SpO2 100%   Physical Exam Vitals and nursing note reviewed.  Constitutional:      Appearance: He is well-developed.  HENT:     Head: Normocephalic and atraumatic.  Eyes:     General:        Right eye: No discharge.        Left eye: No discharge.     Conjunctiva/sclera: Conjunctivae normal.  Neck:     Trachea: No tracheal deviation.  Cardiovascular:     Rate and Rhythm: Normal rate and regular rhythm.  Pulmonary:     Effort: Pulmonary effort is normal.     Breath sounds: Normal breath sounds.  Abdominal:     General: There is no distension.     Palpations: Abdomen is soft.     Tenderness: There is no abdominal tenderness. There is no guarding.  Musculoskeletal:        General: Swelling and tenderness present.     Cervical back: Normal range of motion and neck supple.     Comments: Patient has ecchymosis bilateral forearms without bony tenderness.  Patient has general weakness on exam, equal bilateral.  No midline cervical tenderness.  Patient has left flank tenderness primarily left lower ribs left upper abdomen.  Patient has no tenderness with flexion extension of lower extremities.  Skin:    General: Skin  is warm.     Findings: Bruising (left lower anterior ribs) present. No rash.     Comments: Patient has 2+ edema bilateral lower extremities chronic per patient/family.  Neurological:     General: No focal deficit present.     Mental Status: He is alert. Mental status is at baseline.     Cranial Nerves: No cranial nerve deficit.  Psychiatric:        Behavior: Behavior normal.     ED Results / Procedures / Treatments   Labs (all labs ordered are listed, but only abnormal results are displayed) Labs Reviewed  LIPASE, BLOOD - Abnormal; Notable for the following components:      Result Value   Lipase 77 (*)    All other components within normal limits  COMPREHENSIVE METABOLIC PANEL - Abnormal; Notable for the following components:   Glucose, Bld 310 (*)    BUN 36 (*)    Creatinine, Ser 3.03 (*)    Total Protein 5.5 (*)    Albumin 2.9 (*)    GFR, Estimated 20 (*)    All other components within normal limits  CBC - Abnormal; Notable for the following components:   Platelets 101 (*)    All other components within normal limits  URINALYSIS, ROUTINE W REFLEX MICROSCOPIC    EKG None  Radiology No results found.  Procedures Procedures   Medications Ordered in ED Medications  fentaNYL (SUBLIMAZE) injection 25 mcg (25 mcg Intravenous Given 02/21/21 1502)  ondansetron (ZOFRAN) injection 4 mg (has no administration in time range)    ED Course  I have reviewed the triage vital signs and the nursing notes.  Pertinent labs & imaging results that were available during my care of the patient were reviewed by me and considered in my medical decision making (see chart for details).    MDM Rules/Calculators/A&P                          Patient presents with left flank pain, vomiting and bruising since fall yesterday.  Patient high risk given age  on Plavix.  Patient has poor renal function unable to use contrast.  CT scan head, neck, chest and abdomen pelvis for further details of  likely rib fractures and to look for other injuries.  General blood work ordered.  Pain meds as needed.  Blood work reviewed mostly at patient's baseline glucose 300, creatinine 3.03, normal hemoglobin, platelets 100.  CT scan results and urinalysis pending.  Patient care be signed out to follow-up both ensure patient strong enough to go home with low threshold to admit due to significant rib pain and concern for rib fracture.   Final Clinical Impression(s) / ED Diagnoses Final diagnoses:  Acute left flank pain  Rib contusion, left, initial encounter  Vomiting in adult    Rx / DC Orders ED Discharge Orders    None       Elnora Morrison, MD 02/21/21 1519

## 2021-02-21 NOTE — ED Triage Notes (Signed)
BIB [facility] after staff at [facility] called to report that pt had a increase flank pain, and lower back pain since witnessed fall last evening around 2100. Pt endorses N/V this am.  PT has hx of DM.

## 2021-02-21 NOTE — H&P (Signed)
[facility] History & Physical  Roy Splinter MD  Referring Provider: Dr Theodoro Parma 02/21/2021  Chief Complaint: Fall w/ rib fractures HPI: The patient is a 87 y.o. year-old w/ hx of CVA, dementia, HTN, HL, drop foot (chronic), DM2, CAD, h/o AAA repair, CKD b/l creat 3.0,  hx CABG who presented to ED sent from ALF for flank pain and low back pain since a witnessed fall last night around 9 pm. Has some N/V this morning also. In ED pt had bruising , on Plavix. CT scan showed multiple rib fractures on the left, stable AAA. Trauma team consulted and noted CT showed no PTX. They suggested admission for pain control, pulm toilet and PT/OT. Recommended CXR in am, ok to eat, use SCD"s or chemical DVT proph. COVID returned negative. Asked to see for admission.   Pt seen in ED.  Having some pain off and on L flank/ chest, no prod cough or hemoptysis, no anterior CP , no abd symptoms.  +n/v earlier here. He has reddened bilat lower legs, says "they do that from time to time".  Said he took some abx for it previously, doesn't remember details.  Denies any SOB or orthopnea.    ROS  denies CP  no joint pain   no HA  no blurry vision  no rash  no diarrhea  no dysuria  no difficulty voiding  no change in urine color   Past Medical History  Past Medical History:  Diagnosis Date  . Bilateral foot-drop 01/12/2020  . Coronary artery disease   . Diabetes mellitus without complication ([place])   . Diabetic neuropathy ([place]) 09/16/2017   Mild bilateral foot drops  . Gait abnormality 09/16/2017  . Hyperlipidemia   . Hypertension   . Memory difficulty 03/06/2017  . Stroke ([place]) 08/18/2020  . Toxic goiter    Past Surgical History  Past Surgical History:  Procedure Laterality Date  . ABDOMINAL AORTIC ANEURYSM REPAIR    . cardiac bypass    . CORONARY ARTERY BYPASS GRAFT     Family History  Family History  Problem Relation Age of Onset  . Stroke Mother   . Stroke Father   . Heart attack  Father   . Diabetes Sister   . Heart attack Sister   . Diabetes Brother   . Heart attack Brother   . Diabetes Brother   . Diabetes Brother   . Heart attack Brother    Social History  reports that he quit smoking about 24 years ago. He smoked 1.00 pack per day. He has never used smokeless tobacco. He reports that he does not drink alcohol and does not use drugs. Allergies No Known Allergies Home medications Prior to Admission medications   Medication Sig Start Date End Date Taking? Authorizing Provider  acetaminophen (TYLENOL) 325 MG tablet Take 650 mg by mouth every 6 (six) hours as needed for mild pain, fever or headache.    [provider]  atorvastatin (LIPITOR) 40 MG tablet Take 40 mg by mouth every evening.     [provider]  calcitRIOL (ROCALTROL) 0.25 MCG capsule Take 0.25 mcg by mouth in the morning and at bedtime.  04/29/17   [provider]  clopidogrel (PLAVIX) 75 MG tablet Take 1 tablet (75 mg total) by mouth daily. 08/25/20   Bonnielee Haff, MD  donepezil (ARICEPT) 10 MG tablet TAKE 1 TABLET BY MOUTH ONCE DAILY AT BEDTIME Patient taking differently: Take 10 mg by mouth at bedtime.  01/10/20   Kathrynn Ducking, MD  doxycycline (VIBRAMYCIN) 100 MG capsule 1 capsule 11/24/20   [provider]  DULoxetine (CYMBALTA) 30 MG capsule Take 1 capsule (30 mg total) by mouth at bedtime. 01/12/20   Kathrynn Ducking, MD  furosemide (LASIX) 80 MG tablet Take 0.5 tablets (40 mg total) by mouth daily. 08/30/20   Bonnielee Haff, MD  gabapentin (NEURONTIN) 100 MG capsule 1 capsule    [provider]  insulin aspart (NOVOLOG) 100 UNIT/ML injection Inject 0-15 Units into the skin 3 (three) times daily before meals. Sliding scale    [provider]  insulin glargine (LANTUS SOLOSTAR) 100 UNIT/ML Solostar Pen Inject 25 Units into the skin daily. 08/30/20   Bonnielee Haff, MD  ketoconazole (NIZORAL) 2 % cream Apply 1 application topically daily.     [provider]  levothyroxine (SYNTHROID, LEVOTHROID) 75 MCG tablet Take 75 mcg by mouth daily before breakfast.    [provider]  loratadine (CLARITIN) 10 MG tablet Take 10 mg by mouth daily as needed for allergies.     [provider]  metoCLOPramide (REGLAN) 10 MG tablet Take 1 tablet (10 mg total) by mouth every 6 (six) hours as needed for nausea (nausea/headache). 09/20/20   Deno Etienne, DO  metoprolol succinate (TOPROL-XL) 25 MG 24 hr tablet Take 1 tablet (25 mg total) by mouth daily. Please keep upcoming appt in April with Dr. Tamala Julian before anymore refills. Thank you 03/20/20   Belva Crome, MD  Multiple Vitamins-Minerals (MULTIVITAMIN ADULTS PO) Take 1 tablet by mouth daily.    [provider]  niacin (NIASPAN) 1000 MG CR tablet Take 1,000 mg by mouth 2 (two) times daily.    [provider]  ondansetron (ZOFRAN) 8 MG tablet 0.5 tablet as needed    [provider]  ONETOUCH VERIO test strip  01/12/19   [provider]  polyethylene glycol (MIRALAX) 17 g packet 1 packet mixed with 8 ounces of fluid    [provider]  potassium chloride (KLOR-CON) 10 MEQ tablet Take 1 tablet (10 mEq total) by mouth daily. 08/30/20   Bonnielee Haff, MD  RELION INSULIN SYRINGE 1ML/31G 31G X 5/16" 1 ML MISC USE AS DIRECTED FIVE TIMES DAILY 12/10/19   [provider]  saccharomyces boulardii (FLORASTOR) 250 MG capsule Take 1 capsule (250 mg total) by mouth 2 (two) times daily. 08/24/20   Bonnielee Haff, MD  sertraline (ZOLOFT) 50 MG tablet Take 50 mg by mouth daily.    [provider]  valACYclovir (VALTREX) 500 MG tablet Take 500 mg by mouth daily as needed (out break).     [provider]       Exam Gen alert, no distress, slightly disheveled elderly WM No rash, cyanosis or gangrene Sclera anicteric, throat clear  No jvd or bruits Chest clear bilat to bases, no rales/ wheezing RRR no MRG Abd soft ntnd no  mass or ascites +bs GU normal MS no joint effusions or deformity Ext +1-2 pitting bilat pretibial edema, +bilat diffuse lower leg reddening, non-tender, no ulceration or drainage Neuro is alert, Ox 3 , nf     Home meds:  - lipitor 40/ plavix 75/ lasix 40 qd/ metoprolol xl 25 qd/ klor-con 10 qd/ niacin 1022m bid  - reglan 10 qid prn nausea or ha/ synthroid 75 ug qd  - aricept 10 hs / cymbalta 30 hs/ neurontin 100 bid/ sertraline 50 qd  - valtrex 500 qd prn/ doxy 100 mg  -  insulin glargine 30u qd/ aspart 0-15 tid ac  - sacc boulardii 250 bid  - prn's/ vitamins/ supplements    ED VS > BP 138/98 > 137/65,  HR 57  RR 17  Temp 97.8  Sat 100% RA    Na 137 K 3.7  CO2 24  BUN 36  cR 3.03  Ca 9.5  Alb 2.9  AST 28/ ALT 33  Lipase 77  Tbili 0.8  eGFR 20 ml/min   wBC 6K  Hb 13.7 plt 101  Glucose 310   CT c-spine - IMPRESSION: Multilevel degenerative disc and facet disease changes of the cervical spine as above. No acute cervical spine abnormalities.        CT head - IMPRESSION: 1. No acute intracranial abnormality. No skull fracture. 2. Stable atrophy and chronic small vessel ischemia. Remote lacunar infarcts in the right basal ganglia and thalamus.     CT chest w/o - IMPRESSION: 1. Acute comminuted mildly displaced fracture of the posterior left twelfth rib. Acute minimally displaced fracture of the posterior left eleventh rib. No pneumothorax. 2. No evidence of acute traumatic injury within the abdomen or pelvis. 3. Diffuse gastric wall thickening. Correlate for signs and symptoms of gastritis and consider follow-up with endoscopy. 4. Layering sludge or small stones within the gallbladder lumen. No pericholecystic inflammatory changes by CT. 5. Colonic diverticulosis without evidence of acute diverticulitis. 6. Aortoiliac stent graft remains in place. Native infrarenal abdominal aortic aneurysm sac is stable in size measuring approximately 3.4 cm in AP dimension.   Assessment/ Plan: 1. Fall / L  sided rib fractures - recommending admission per Trauma team for pain control, pulm toilet. Per trauma team. Pain meds ordered per trauma w/ ultram po and fentanyl IV prn.   2. CKD IV - creat at baseline of 3.0, eGFR around 20 ml/min.   3. Bilateral LE edema - sig edema pretib bilat. Will start patient on IV lasix 40 bid for a few days while here. Watch creat daily.  4. Stasis dermatitis - no fever, pain etc to suggest infection yet. Will use diuresis and topical Rx.  5. Dementia/ depression - cont  meds 6. HTN - cont metoprolol xl 7. IDDM - type 2, cont lantus here at slightly lower dose 20u qd w/ SSI qid 8. PAD / sp stent graft repair of abdominal aneurysm/  aorto-iliac disease- cont plavix 9. CAD hx of CABG - no recent chest pain      Roy Splinter  MD 02/21/2021, 4:23 PM

## 2021-02-21 NOTE — ED Provider Notes (Signed)
  Physical Exam  BP (!) 137/98 (BP Location: Left Arm)   Pulse (!) 58   Temp 97.9 F (36.6 C) (Oral)   Resp 17   SpO2 100%   Physical Exam  ED Course/Procedures     Procedures  MDM  Care assumed at signout from Dr. Reather Converse at 3 PM.  Patient had slipped and fell and hit the left side of his ribs in the shower.  Patient has left rib pain and abdominal pain since then.  Trauma scan was pending at signout.  Anticipate admission if he has rib fractures  4:10 PM Labs show baseline creatinine of 3.  He has multiple rib fractures on the left.  He also has stable aortic aneurysm.  I discussed case with trauma who plans to see patient as consult.  Since he has a history of dementia and also CKD, they request medicine to be the primary service and they will consult      Drenda Freeze, MD 02/21/21 1705

## 2021-02-22 ENCOUNTER — Inpatient Hospital Stay (HOSPITAL_COMMUNITY): Payer: HMO

## 2021-02-22 ENCOUNTER — Observation Stay (HOSPITAL_COMMUNITY): Payer: HMO

## 2021-02-22 DIAGNOSIS — S2249XA Multiple fractures of ribs, unspecified side, initial encounter for closed fracture: Secondary | ICD-10-CM | POA: Diagnosis not present

## 2021-02-22 DIAGNOSIS — Z66 Do not resuscitate: Secondary | ICD-10-CM | POA: Diagnosis present

## 2021-02-22 DIAGNOSIS — E1122 Type 2 diabetes mellitus with diabetic chronic kidney disease: Secondary | ICD-10-CM | POA: Diagnosis present

## 2021-02-22 DIAGNOSIS — Z20822 Contact with and (suspected) exposure to covid-19: Secondary | ICD-10-CM | POA: Diagnosis present

## 2021-02-22 DIAGNOSIS — R296 Repeated falls: Secondary | ICD-10-CM | POA: Diagnosis present

## 2021-02-22 DIAGNOSIS — M545 Low back pain, unspecified: Secondary | ICD-10-CM | POA: Diagnosis present

## 2021-02-22 DIAGNOSIS — Z886 Allergy status to analgesic agent status: Secondary | ICD-10-CM | POA: Diagnosis not present

## 2021-02-22 DIAGNOSIS — K5730 Diverticulosis of large intestine without perforation or abscess without bleeding: Secondary | ICD-10-CM | POA: Diagnosis present

## 2021-02-22 DIAGNOSIS — I129 Hypertensive chronic kidney disease with stage 1 through stage 4 chronic kidney disease, or unspecified chronic kidney disease: Secondary | ICD-10-CM | POA: Diagnosis present

## 2021-02-22 DIAGNOSIS — Z823 Family history of stroke: Secondary | ICD-10-CM | POA: Diagnosis not present

## 2021-02-22 DIAGNOSIS — E1165 Type 2 diabetes mellitus with hyperglycemia: Secondary | ICD-10-CM | POA: Diagnosis present

## 2021-02-22 DIAGNOSIS — N184 Chronic kidney disease, stage 4 (severe): Secondary | ICD-10-CM | POA: Diagnosis present

## 2021-02-22 DIAGNOSIS — E785 Hyperlipidemia, unspecified: Secondary | ICD-10-CM | POA: Diagnosis present

## 2021-02-22 DIAGNOSIS — I872 Venous insufficiency (chronic) (peripheral): Secondary | ICD-10-CM | POA: Diagnosis present

## 2021-02-22 DIAGNOSIS — I714 Abdominal aortic aneurysm, without rupture: Secondary | ICD-10-CM | POA: Diagnosis present

## 2021-02-22 DIAGNOSIS — W182XXA Fall in (into) shower or empty bathtub, initial encounter: Secondary | ICD-10-CM | POA: Diagnosis present

## 2021-02-22 DIAGNOSIS — M21372 Foot drop, left foot: Secondary | ICD-10-CM | POA: Diagnosis present

## 2021-02-22 DIAGNOSIS — Y93E1 Activity, personal bathing and showering: Secondary | ICD-10-CM | POA: Diagnosis not present

## 2021-02-22 DIAGNOSIS — I69354 Hemiplegia and hemiparesis following cerebral infarction affecting left non-dominant side: Secondary | ICD-10-CM | POA: Diagnosis not present

## 2021-02-22 DIAGNOSIS — F0390 Unspecified dementia without behavioral disturbance: Secondary | ICD-10-CM | POA: Diagnosis present

## 2021-02-22 DIAGNOSIS — R600 Localized edema: Secondary | ICD-10-CM | POA: Diagnosis not present

## 2021-02-22 DIAGNOSIS — E039 Hypothyroidism, unspecified: Secondary | ICD-10-CM | POA: Diagnosis present

## 2021-02-22 DIAGNOSIS — M7989 Other specified soft tissue disorders: Secondary | ICD-10-CM | POA: Diagnosis present

## 2021-02-22 DIAGNOSIS — I2510 Atherosclerotic heart disease of native coronary artery without angina pectoris: Secondary | ICD-10-CM | POA: Diagnosis present

## 2021-02-22 DIAGNOSIS — Z951 Presence of aortocoronary bypass graft: Secondary | ICD-10-CM | POA: Diagnosis not present

## 2021-02-22 DIAGNOSIS — R112 Nausea with vomiting, unspecified: Secondary | ICD-10-CM | POA: Diagnosis present

## 2021-02-22 DIAGNOSIS — S2242XA Multiple fractures of ribs, left side, initial encounter for closed fracture: Secondary | ICD-10-CM | POA: Diagnosis present

## 2021-02-22 DIAGNOSIS — F32A Depression, unspecified: Secondary | ICD-10-CM | POA: Diagnosis present

## 2021-02-22 LAB — CBC
HCT: 37.5 % — ABNORMAL LOW (ref 39.0–52.0)
Hemoglobin: 12.5 g/dL — ABNORMAL LOW (ref 13.0–17.0)
MCH: 30.1 pg (ref 26.0–34.0)
MCHC: 33.3 g/dL (ref 30.0–36.0)
MCV: 90.4 fL (ref 80.0–100.0)
Platelets: 102 10*3/uL — ABNORMAL LOW (ref 150–400)
RBC: 4.15 MIL/uL — ABNORMAL LOW (ref 4.22–5.81)
RDW: 13.1 % (ref 11.5–15.5)
WBC: 5.7 10*3/uL (ref 4.0–10.5)
nRBC: 0 % (ref 0.0–0.2)

## 2021-02-22 LAB — URINALYSIS, ROUTINE W REFLEX MICROSCOPIC
Bilirubin Urine: NEGATIVE
Glucose, UA: 150 mg/dL — AB
Hgb urine dipstick: NEGATIVE
Ketones, ur: NEGATIVE mg/dL
Nitrite: NEGATIVE
Protein, ur: 30 mg/dL — AB
Specific Gravity, Urine: 1.011 (ref 1.005–1.030)
pH: 5 (ref 5.0–8.0)

## 2021-02-22 LAB — BASIC METABOLIC PANEL
Anion gap: 9 (ref 5–15)
BUN: 41 mg/dL — ABNORMAL HIGH (ref 8–23)
CO2: 24 mmol/L (ref 22–32)
Calcium: 9.1 mg/dL (ref 8.9–10.3)
Chloride: 105 mmol/L (ref 98–111)
Creatinine, Ser: 3.27 mg/dL — ABNORMAL HIGH (ref 0.61–1.24)
GFR, Estimated: 18 mL/min — ABNORMAL LOW (ref >60)
Glucose, Bld: 225 mg/dL — ABNORMAL HIGH (ref 70–99)
Potassium: 4.1 mmol/L (ref 3.5–5.1)
Sodium: 138 mmol/L (ref 135–145)

## 2021-02-22 LAB — CBG MONITORING, ED: Glucose-Capillary: 188 mg/dL — ABNORMAL HIGH (ref 70–99)

## 2021-02-22 LAB — GLUCOSE, CAPILLARY
Glucose-Capillary: 227 mg/dL — ABNORMAL HIGH (ref 70–99)
Glucose-Capillary: 244 mg/dL — ABNORMAL HIGH (ref 70–99)
Glucose-Capillary: 212 mg/dL — ABNORMAL HIGH (ref 70–99)

## 2021-02-22 MED ORDER — CLOPIDOGREL BISULFATE 75 MG PO TABS
75.0000 mg | ORAL_TABLET | Freq: Every day | ORAL | Status: DC
  Administered 2021-02-22 – 2021-02-24 (×3): 75 mg via ORAL
  Filled 2021-02-22 (×3): qty 1

## 2021-02-22 MED ORDER — SODIUM CHLORIDE 0.9 % IV SOLN: 12.5000 mg | Freq: Once | INTRAVENOUS | Status: DC

## 2021-02-22 NOTE — Progress Notes (Addendum)
Occupational Therapy Evaluation Patient Details Name: [name] MRN: [id number] DOB: [birth year] Today's Date: 02/22/2021    History of Present Illness 87 y.o. year-old w/ hx of CVA, dementia, HTN, HL, drop foot (chronic), DM2, CAD, h/o AAA repair, CKD b/l creat 3.0,  hx CABG who presented to ED sent from ALF for flank pain and low back pain since a witnessed fall. CT scan showed multiple rib fractures on the left.   Clinical Impression   PTA pt lives in his ALF [facility]) and requires assistance from staff with ADL and mobility, however is normally able to transfer to his wc<>toilet and complete pericare @ modified independent level. Son present during eval and states he and his sister noticed changes in his Dad's speech over the weekend when the falls began on Saturday. Pt has had falls on Saturday, Monday and Tuesday (with staff present) at the facility. Pt states his L side feels "a little weaker". MD made aware. Pt requires min A +2 with limited mobility @ RW level and Mod A with LB ADL. Given multiple falls and apparent decline in function, recommend rehab at [facility]. Pt/son agreeable. Will follow acutely.     Follow Up Recommendations  SNF;Supervision/Assistance - 24 hour    Equipment Recommendations  None recommended by OT    Recommendations for Other Services       Precautions / Restrictions Precautions Precautions: Fall      Mobility Bed Mobility Overal bed mobility: Needs Assistance Bed Mobility: Supine to Sit     Supine to sit: Mod assist;+2 for physical assistance          Transfers Overall transfer level: Needs assistance Equipment used: Rolling walker (2 wheeled) Transfers: Sit to/from Stand Sit to Stand: Min assist;+2 physical assistance - limited mobility to side stepping Educated on use of "splinting" technique to reduce pain during mobility              Balance Overall balance assessment: History of Falls (Has fallen 3 times in 3 days)                                          ADL either performed or assessed with clinical judgement   ADL Overall ADL's : Needs assistance/impaired     Grooming: Set up;Sitting   Upper Body Bathing: Sitting;Minimal assistance Upper Body Bathing Details (indicate cue type and reason): painful L flank Lower Body Bathing: Moderate assistance;Sit to/from stand   Upper Body Dressing : Moderate assistance;Sitting   Lower Body Dressing: Moderate assistance;Sit to/from stand   Toilet Transfer: Minimal assistance;+2 for physical assistance;RW   Toileting- Clothing Manipulation and Hygiene: Maximal assistance Toileting - Clothing Manipulation Details (indicate cue type and reason): unsure if incontinenet however pt states is uses the toilet at home     Functional mobility during ADLs: Minimal assistance;+2 for physical assistance;Cueing for safety;Cueing for sequencing;Rolling walker       Vision Baseline Vision/History: Wears glasses Additional Comments: will further assess; pt does nto report changes     Perception Perception Comments: ? L inattention at times   Praxis      Pertinent Vitals/Pain       Hand Dominance     Extremity/Trunk Assessment Upper Extremity Assessment Upper Extremity Assessment: LUE deficits/detail LUE Deficits / Details: hemiparesis however try to use funcitonally; decreased control and coordination noted. AROM generally WFL; overall weaker than R LUE  Coordination: decreased fine motor   Lower Extremity Assessment Lower Extremity Assessment: Defer to PT evaluation (L footdrop; peripheral neuropathy)       Communication Communication Communication: Expressive difficulties (dysarthria; son staes he and his sister noticed his Dad's speech seemed worse over the weekend when falls started)   Cognition Arousal/Alertness: Awake/alert Behavior During Therapy: [facility] for tasks assessed/performed                                    General Comments: Initially reporting it was april   General Comments       Exercises  incentive spirometer x 5 - able to pull 795ml   Shoulder Instructions      Home Living Family/patient expects to be discharged to:: Assisted living (wc accessible)                             Home Equipment: Walker - 2 wheels;Wheelchair - manual   Additional Comments: Lives at [facility]      Prior Functioning/Environment Level of Independence: Needs assistance  Gait / Transfers Assistance Needed: Uses WC for mobility. Able to propel stuff. Staff assists with transfers. ADL's / Homemaking Assistance Needed: Reports staff assists with ADLs. Staff assists with toileting tasks.   Comments: Pt has apparently had 3 falls on 3 different days with staff present        OT Problem List: Decreased strength;Decreased activity tolerance;Impaired balance (sitting and/or standing);Decreased cognition;Decreased safety awareness;Decreased knowledge of use of DME or AE;Obesity;Pain;Impaired sensation      OT Treatment/Interventions: Self-care/ADL training;Therapeutic exercise;DME and/or AE instruction;Therapeutic activities;Cognitive remediation/compensation;Balance training;Patient/family education    OT Goals(Current goals can be found in the care plan section) Acute Rehab OT Goals Patient Stated Goal: to get stronger and stop falling OT Goal Formulation: With patient/family Time For Goal Achievement: 03/08/21 Potential to Achieve Goals: Good  OT Frequency: Min 2X/week   Barriers to D/C:            Co-evaluation              AM-PAC OT "6 Clicks" Daily Activity     Outcome Measure Help from another person eating meals?: A Little Help from another person taking care of personal grooming?: A Little Help from another person toileting, which includes using toliet, bedpan, or urinal?: A Lot Help from another person bathing (including washing, rinsing, drying)?: A Lot Help  from another person to put on and taking off regular upper body clothing?: A Lot Help from another person to put on and taking off regular lower body clothing?: A Lot 6 Click Score: 14   End of Session Equipment Utilized During Treatment: Gait belt;Rolling walker Nurse Communication: Mobility status;Other (comment) (DC needs)  Activity Tolerance: Patient tolerated treatment well Patient left: Other (comment);with family/visitor present (on stretcher)  OT Visit Diagnosis: Unsteadiness on feet (R26.81);Other abnormalities of gait and mobility (R26.89);Repeated falls (R29.6);Muscle weakness (generalized) (M62.81);Other symptoms and signs involving cognitive function;Pain;Hemiplegia and hemiparesis Hemiplegia - Right/Left: Left Hemiplegia - dominant/non-dominant: Non-Dominant Hemiplegia - caused by: Cerebral infarction (stroke in 08/2020 - per family report) Pain - Right/Left: Left Pain - part of body:  (flank)                Time: 2725-3664 OT Time Calculation (min): 34 min Charges:  OT General Charges $OT Visit: 1 Visit OT Evaluation $OT Eval Moderate Complexity: 1 Mod  Maurie Boettcher, OT/L   Acute OT Clinical Specialist Acute Rehabilitation Services Pager [phone] Office [phone]   [facility] 02/22/2021, 1:52 PM

## 2021-02-22 NOTE — TOC CAGE-AID Note (Signed)
Transition of Care [facility]) - CAGE-AID Screening   Patient Details  Name: [name] MRN: [id number] Date of Birth: [birth year]  Clinical Narrative:  Patient states no need for resources at this time.  CAGE-AID Screening:    Have You Ever Felt You Ought to Cut Down on Your Drinking or Drug Use?: No Have People Annoyed You By Critizing Your Drinking Or Drug Use?: No Have You Felt Bad Or Guilty About Your Drinking Or Drug Use?: No Have You Ever Had a Drink or Used Drugs First Thing In The Morning to Steady Your Nerves or to Get Rid of a Hangover?: No CAGE-AID Score: 0  Substance Abuse Education Offered: No

## 2021-02-22 NOTE — Progress Notes (Signed)
[facility] Progress Note     Subjective: CC-  Comfortable this morning. States that he does still have some discomfort from rib fractures, but overall well controlled. Pain is worse with movement and coughing. Denies productive cough. Denies SOB. O2 sats 100% on room air. Pulling 1100 on IS.  Has not gotten OOB yet. Has not noticed any new injuries.  Objective: Vital signs in last 24 hours: Temp:  [97.8 F (36.6 C)-97.9 F (36.6 C)] 97.8 F (36.6 C) (03/16 1646) Pulse Rate:  [45-63] 45 (03/17 0530) Resp:  [12-20] 13 (03/17 0530) BP: (110-148)/(49-98) 139/59 (03/17 0530) SpO2:  [89 %-100 %] 95 % (03/17 0530)    Intake/Output from previous day: No intake/output data recorded. Intake/Output this shift: No intake/output data recorded.  PE: Gen:  Alert, NAD, pleasant HEENT: EOM's intact, pupils equal and round Card:  HR 50s, regular rhythm Pulm:  CTAB, no W/R/R, rate and effort normal on room air, pulling 1100 on IS Abd: Soft, NT/ND, +BS, no HSM Ext:  calves soft and nontender, mild BLE edema, weakness noted to LUE/LLE Psych: A&Ox4  Skin: no rashes noted, warm and dry  Lab Results:  Recent Labs    02/21/21 1312 02/22/21 0530  WBC 6.6 5.7  HGB 13.7 12.5*  HCT 41.7 37.5*  PLT 101* 102*   BMET Recent Labs    02/21/21 1312 02/22/21 0530  NA 137 138  K 3.7 4.1  CL 104 105  CO2 24 24  GLUCOSE 310* 225*  BUN 36* 41*  CREATININE 3.03* 3.27*  CALCIUM 9.5 9.1   PT/INR No results for input(s): LABPROT, INR in the last 72 hours. CMP     Component Value Date/Time   NA 138 02/22/2021 0530   K 4.1 02/22/2021 0530   CL 105 02/22/2021 0530   CO2 24 02/22/2021 0530   GLUCOSE 225 (H) 02/22/2021 0530   BUN 41 (H) 02/22/2021 0530   CREATININE 3.27 (H) 02/22/2021 0530   CREATININE 2.01 (H) 05/11/2015 1339   CALCIUM 9.1 02/22/2021 0530   PROT 5.5 (L) 02/21/2021 1312   PROT 5.8 (L) 01/12/2020 1605   ALBUMIN 2.9 (L) 02/21/2021 1312   AST 28 02/21/2021  1312   ALT 33 02/21/2021 1312   ALKPHOS 63 02/21/2021 1312   BILITOT 0.8 02/21/2021 1312   GFRNONAA 18 (L) 02/22/2021 0530   GFRAA 26 (L) 08/30/2020 0350   Lipase     Component Value Date/Time   LIPASE 77 (H) 02/21/2021 1312       Studies/Results: CT ABDOMEN PELVIS WO CONTRAST  Result Date: 02/21/2021 CLINICAL DATA:  Left flank pain after fall in shower EXAM: CT CHEST, ABDOMEN AND PELVIS WITHOUT CONTRAST TECHNIQUE: Multidetector CT imaging of the chest, abdomen and pelvis was performed following the standard protocol without IV contrast. COMPARISON:  09/20/2020 FINDINGS: CT CHEST FINDINGS Cardiovascular: Heart size is normal. No pericardial effusion. Thoracic aorta is nonaneurysmal. Atherosclerotic calcifications of the aorta and coronary arteries. Borderline enlargement of the main pulmonary trunk. Post CABG changes. Mediastinum/Nodes: No axillary, mediastinal, or hilar lymphadenopathy. Left thyroid lobe nodule, which has been previously biopsied. Trachea and esophagus within normal limits. Lungs/Pleura: Lungs are clear. No airspace consolidation, pleural effusion, or pneumothorax. Musculoskeletal: Acute comminuted mildly displaced fracture of the posterior left twelfth rib. Acute minimally displaced fracture of the posterior left eleventh rib. Thoracic vertebral body heights and alignment are maintained without fracture or static listhesis. No chest wall abnormality. CT ABDOMEN PELVIS FINDINGS Hepatobiliary: Unenhanced liver appears within normal limits.  No evidence of a Paddock laceration or perihepatic hematoma. Layering sludge or small stones within the gallbladder lumen. No pericholecystic inflammatory changes by CT. Pancreas: Unremarkable. No pancreatic ductal dilatation or surrounding inflammatory changes. Spleen: No evidence of splenic injury or perisplenic hematoma. Adrenals/Urinary Tract: Adrenal glands are unremarkable. Kidneys are normal, without renal calculi, focal lesion, or  hydronephrosis. Bladder is unremarkable. Stomach/Bowel: Small sliding hiatal hernia. Diffuse gastric wall thickening. No dilated loops of bowel to suggest obstruction. Scattered colonic diverticulosis. No focal bowel wall thickening or inflammatory changes are seen. Vascular/Lymphatic: Aortoiliac stent graft remains in place. Native infrarenal abdominal aortic aneurysm sac is stable in size measuring approximately 3.4 cm in AP dimension. No abdominopelvic lymphadenopathy. Reproductive: Mildly enlarged prostate gland. Other: No free fluid. No abdominopelvic fluid collection. No pneumoperitoneum. Tiny fat containing umbilical hernia. Musculoskeletal: No acute osseous abnormality. Pelvic bony ring intact. Bilateral hips intact without fracture or dislocation. IMPRESSION: 1. Acute comminuted mildly displaced fracture of the posterior left twelfth rib. Acute minimally displaced fracture of the posterior left eleventh rib. No pneumothorax. 2. No evidence of acute traumatic injury within the abdomen or pelvis. 3. Diffuse gastric wall thickening. Correlate for signs and symptoms of gastritis and consider follow-up with endoscopy. 4. Layering sludge or small stones within the gallbladder lumen. No pericholecystic inflammatory changes by CT. 5. Colonic diverticulosis without evidence of acute diverticulitis. 6. Aortoiliac stent graft remains in place. Native infrarenal abdominal aortic aneurysm sac is stable in size measuring approximately 3.4 cm in AP dimension. 7. Aortic atherosclerosis (ICD10-I70.0). Electronically Signed   By: Davina Poke D.O.   On: 02/21/2021 15:25   CT Head Wo Contrast  Result Date: 02/21/2021 CLINICAL DATA:  Fall in the shower.  Vomiting. EXAM: CT HEAD WITHOUT CONTRAST TECHNIQUE: Contiguous axial images were obtained from the base of the skull through the vertex without intravenous contrast. COMPARISON:  Head CT 08/19/2020 FINDINGS: Brain: Stable degree of atrophy and chronic small vessel  ischemia. No intracranial hemorrhage, mass effect, or midline shift. No hydrocephalus. The basilar cisterns are patent. Remote lacunar infarcts in the right basal ganglia and thalamus. No evidence of territorial infarct or acute ischemia. No extra-axial or intracranial fluid collection. Vascular: No hyperdense vessel or unexpected calcification. Skull: No fracture or focal lesion. Sinuses/Orbits: No acute findings. Minimal opacification of lower left mastoid air cells unchanged from prior exam. Small mucous retention cyst and mucosal thickening of left maxillary sinus. Other: None. IMPRESSION: 1. No acute intracranial abnormality. No skull fracture. 2. Stable atrophy and chronic small vessel ischemia. Remote lacunar infarcts in the right basal ganglia and thalamus. Electronically Signed   By: Keith Rake M.D.   On: 02/21/2021 15:19   CT Chest Wo Contrast  Result Date: 02/21/2021 CLINICAL DATA:  Left flank pain after fall in shower EXAM: CT CHEST, ABDOMEN AND PELVIS WITHOUT CONTRAST TECHNIQUE: Multidetector CT imaging of the chest, abdomen and pelvis was performed following the standard protocol without IV contrast. COMPARISON:  09/20/2020 FINDINGS: CT CHEST FINDINGS Cardiovascular: Heart size is normal. No pericardial effusion. Thoracic aorta is nonaneurysmal. Atherosclerotic calcifications of the aorta and coronary arteries. Borderline enlargement of the main pulmonary trunk. Post CABG changes. Mediastinum/Nodes: No axillary, mediastinal, or hilar lymphadenopathy. Left thyroid lobe nodule, which has been previously biopsied. Trachea and esophagus within normal limits. Lungs/Pleura: Lungs are clear. No airspace consolidation, pleural effusion, or pneumothorax. Musculoskeletal: Acute comminuted mildly displaced fracture of the posterior left twelfth rib. Acute minimally displaced fracture of the posterior left eleventh rib. Thoracic vertebral body heights  and alignment are maintained without fracture or  static listhesis. No chest wall abnormality. CT ABDOMEN PELVIS FINDINGS Hepatobiliary: Unenhanced liver appears within normal limits. No evidence of a Paddock laceration or perihepatic hematoma. Layering sludge or small stones within the gallbladder lumen. No pericholecystic inflammatory changes by CT. Pancreas: Unremarkable. No pancreatic ductal dilatation or surrounding inflammatory changes. Spleen: No evidence of splenic injury or perisplenic hematoma. Adrenals/Urinary Tract: Adrenal glands are unremarkable. Kidneys are normal, without renal calculi, focal lesion, or hydronephrosis. Bladder is unremarkable. Stomach/Bowel: Small sliding hiatal hernia. Diffuse gastric wall thickening. No dilated loops of bowel to suggest obstruction. Scattered colonic diverticulosis. No focal bowel wall thickening or inflammatory changes are seen. Vascular/Lymphatic: Aortoiliac stent graft remains in place. Native infrarenal abdominal aortic aneurysm sac is stable in size measuring approximately 3.4 cm in AP dimension. No abdominopelvic lymphadenopathy. Reproductive: Mildly enlarged prostate gland. Other: No free fluid. No abdominopelvic fluid collection. No pneumoperitoneum. Tiny fat containing umbilical hernia. Musculoskeletal: No acute osseous abnormality. Pelvic bony ring intact. Bilateral hips intact without fracture or dislocation. IMPRESSION: 1. Acute comminuted mildly displaced fracture of the posterior left twelfth rib. Acute minimally displaced fracture of the posterior left eleventh rib. No pneumothorax. 2. No evidence of acute traumatic injury within the abdomen or pelvis. 3. Diffuse gastric wall thickening. Correlate for signs and symptoms of gastritis and consider follow-up with endoscopy. 4. Layering sludge or small stones within the gallbladder lumen. No pericholecystic inflammatory changes by CT. 5. Colonic diverticulosis without evidence of acute diverticulitis. 6. Aortoiliac stent graft remains in place. Native  infrarenal abdominal aortic aneurysm sac is stable in size measuring approximately 3.4 cm in AP dimension. 7. Aortic atherosclerosis (ICD10-I70.0). Electronically Signed   By: Davina Poke D.O.   On: 02/21/2021 15:25   CT Cervical Spine Wo Contrast  Result Date: 02/21/2021 CLINICAL DATA:  [place] in shower, nausea, vomiting, pain, neck trauma, initial encounter EXAM: CT CERVICAL SPINE WITHOUT CONTRAST TECHNIQUE: Multidetector CT imaging of the cervical spine was performed without intravenous contrast. Multiplanar CT image reconstructions were also generated. COMPARISON:  03/18/2020 FINDINGS: Alignment: Normal Skull base and vertebrae: Slight osseous demineralization. Scattered disc space narrowing and endplate spur formation. Multilevel facet degenerative changes cervical spine. Vertebral body heights maintained without fracture, subluxation, or bone destruction. Skull base intact. Soft tissues and spinal canal: Prevertebral soft tissues normal thickness. Atherosclerotic calcifications in the carotid systems, proximal great vessels, and at cranial aspect of aortic arch. Large LEFT lobe thyroid nodule 3.0 cm diameter, unchanged since prior exam as well as prior thyroid ultrasound of 2011; this previously underwent FNA under ultrasound guidance, no further workup recommended. This has been evaluated on previous imaging. (ref: J Am Coll Radiol. 2015 Feb;12(2): 143-50). Disc levels: Scattered endplate spur formation. No focal disc abnormalities. AP narrowing of spinal canal by endplate spurs and accompanying mild disc bulges. Upper chest: Lung apices clear Other: N/A IMPRESSION: Multilevel degenerative disc and facet disease changes of the cervical spine as above. No acute cervical spine abnormalities. Aortic Atherosclerosis (ICD10-I70.0). Electronically Signed   By: Lavonia Dana M.D.   On: 02/21/2021 15:41   DG CHEST PORT 1 VIEW  Result Date: 02/22/2021 CLINICAL DATA:  Rib fractures EXAM: PORTABLE CHEST 1 VIEW  COMPARISON:  Chest CT from yesterday FINDINGS: Rib fractures better demonstrated by CT. No visible effusion or pneumothorax. Lung volumes are low; no demonstrable infiltrate or atelectasis. Stable heart size and mediastinal contours distorted by rightward rotation. CABG. IMPRESSION: Low volume chest with symmetric aeration. Electronically Signed  By: Monte Fantasia M.D.   On: 02/22/2021 05:51    Anti-infectives: Anti-infectives (From admission, onward)   None       Assessment/Plan Fall Left 11-12th rib fractures - CT scan without PTX. Follow up CXR this AM stable without PNX. O2 sats stable on room air, pain seems well controlled on current multimodal medication regimen. Continue pulm toilet. PT/OT.  MMP including CVA, HTN, HLD, DM2, CAD on Plavix, CKD (Cr 3.03) - Per TRH  Elevated Lipase - Lipase 77. No pancreatitis changes noted on CT Thrombocytopenia - monitor, may need to hold chemical DVT prophylaxis if platelets fall below 100  FEN - CM diet VTE - SCDs, lovenox ID - None indicated from a trauma standpoint Follow up - PCP  Plan: Rib fracture management as above. PT/OT consults pending.  Ok to resume plavix from our standpoint. Trauma will sign off, please call with concerns.    LOS: 0 days    Wellington Hampshire, [facility] 02/22/2021, 8:47 AM Please see Amion for pager number during day hours 7:00am-4:30pm

## 2021-02-22 NOTE — ED Notes (Signed)
While pt was sitting up eating breakfast, pt states he felt nauseous

## 2021-02-22 NOTE — Progress Notes (Signed)
  Appropriate Use Committee Chart Review  Chart reviewed by the physician advisor with input from [facility] and the attending MD as needed for review of the appropriateness for SNF referral.  TOC notes, PT/OT/ST notes, nursing notes and physician notes reviewed for medical necessity to determine if the patient's needs are appropriate for short-term rehab to return to a prior level of function versus the likely need for custodial care.  At this time, the patient appears to meet Medicare criteria for SNF placement for short-term rehab. Per PT notes: PTA pt lives in his ALF [facility]) and requires assistance from staff with ADL and mobility, however is normally able to transfer to his wc<>toilet and complete pericare @ modified independent level. Currently requiring min A +2 with limited mobility.    Recommendations: The patient is SNF appropriate for short-term rehab/skilled nursing interventions prior to returning to ALF. It may be time to start considering options for long-term care given age/co-morbidities including dementia.    A consult to the Transitions of Care Team should be made to facilitate placement.  Risk of re-admission: 21% (medium) ED visits in the last 6 months: 3 Hospitalizations in the past year: 1  Jacquelynn Cree, MD Chief Physician Advisor 02/22/2021 4:52 PM

## 2021-02-22 NOTE — ED Notes (Signed)
Pt sitting up eating breakfast tray 

## 2021-02-22 NOTE — Progress Notes (Signed)
02/22/21 1507  PT Visit Information  Last PT Received On 02/22/21  Assistance Needed +2  History of Present Illness 87 y.o. year-old w/ hx of CVA, dementia, HTN, HL, drop foot (chronic), DM2, CAD, h/o AAA repair, CKD b/l creat 3.0,  hx CABG who presented to ED sent from ALF for flank pain and low back pain since a witnessed fall. CT scan showed multiple rib fractures on the left.  Precautions  Precautions Fall  Restrictions  Weight Bearing Restrictions No  Home Living  Family/patient expects to be discharged to: Assisted living (wc accessible)  [place] - 2 wheels;Wheelchair - manual  Additional Comments Lives at [facility]  Prior Function  Level of Independence Needs assistance  Gait / Transfers Assistance Needed Uses WC for mobility. Able to propel self. Staff assists with transfers.  ADL's / Homemaking Assistance Needed Reports staff assists with ADLs. Staff assists with toileting tasks.  Comments Pt has apparently had 3 falls on 3 different days with staff present  Communication  Communication Expressive difficulties (dysarthria; son staes he and his sister noticed his Dad's speech seemed worse over the weekend when falls started)  Cognition  Arousal/Alertness Awake/alert  Behavior During Therapy [facility] for tasks assessed/performed  Overall Cognitive Status Impaired/Different from baseline  Area of Impairment Orientation;Memory  Orientation Level Disoriented to;Time  Memory Decreased short-term memory  General Comments Pt with hx of CVA at baseline. Initially reporting it was April, but able to correct with cues.  Upper Extremity Assessment  Upper Extremity Assessment Defer to OT evaluation  Lower Extremity Assessment  Lower Extremity Assessment LLE deficits/detail  LLE Deficits / Details LLE weakness at baseline. Pt reports it has been buckling on him recently more than it has in the past.  Bed Mobility  Overal bed mobility Needs Assistance  Bed Mobility  Supine to Sit;Sit to Supine  Supine to sit Mod assist;+2 for physical assistance  Sit to supine Mod assist;+2 for physical assistance  General bed mobility comments required assist for trunk and LEs. Pt bracing pillow throughout.  Transfers  Overall transfer level Needs assistance  Equipment used Rolling walker (2 wheeled)  Transfers Sit to/from Stand  Sit to Stand Min assist;+2 physical assistance  General transfer comment Min A for lift assist and steadying to stand. Took side steps at edge of stretcher with min A +2 for steadying.  Balance  Overall balance assessment History of Falls;Needs assistance (Has fallen 3 times in 3 days)  Sitting-balance support No upper extremity supported;Feet supported  Sitting balance-Leahy Scale Fair  Standing balance support Bilateral upper extremity supported;During functional activity  Standing balance-Leahy Scale Poor  Standing balance comment Reliant on BUE support and external support.  PT - End of Session  Equipment Utilized During Treatment Gait belt  Activity Tolerance Patient tolerated treatment well  Patient left in bed;with call bell/phone within reach;with family/visitor present (on stretcher in ED)  Nurse Communication Mobility status  PT Assessment  PT Recommendation/Assessment Patient needs continued PT services  PT Visit Diagnosis Unsteadiness on feet (R26.81);Repeated falls (R29.6);History of falling (Z91.81);Muscle weakness (generalized) (M62.81)  PT Problem List Decreased strength;Decreased balance;Decreased mobility;Decreased cognition;Decreased knowledge of use of DME;Decreased safety awareness;Decreased knowledge of precautions  PT Plan  PT Frequency (ACUTE ONLY) Min 2X/week  PT Treatment/Interventions (ACUTE ONLY) DME instruction;Gait training;Functional mobility training;Therapeutic activities;Therapeutic exercise;Balance training;Patient/family education;Wheelchair mobility training  AM-PAC PT "6 Clicks" Mobility Outcome  Measure (Version 2)  Help needed turning from your back to your side while in a flat  bed without using bedrails? 2  Help needed moving from lying on your back to sitting on the side of a flat bed without using bedrails? 2  Help needed moving to and from a bed to a chair (including a wheelchair)? 3  Help needed standing up from a chair using your arms (e.g., wheelchair or bedside chair)? 3  Help needed to walk in hospital room? 2  Help needed climbing 3-5 steps with a railing?  1  6 Click Score 13  Consider Recommendation of Discharge To: CIR/SNF/LTACH  PT Recommendation  Follow Up Recommendations SNF  PT equipment None recommended by PT  Individuals Consulted  Consulted and Agree with Results and Recommendations Patient;Family member/caregiver  Family Member Consulted son  Acute Rehab PT Goals  Patient Stated Goal to get stronger and stop falling  PT Goal Formulation With patient  Time For Goal Achievement 03/08/21  Potential to Achieve Goals Good  PT Time Calculation  PT Start Time (ACUTE ONLY) 0946  PT Stop Time (ACUTE ONLY) 1020  PT Time Calculation (min) (ACUTE ONLY) 34 min  PT General Charges  $$ ACUTE PT VISIT 1 Visit  PT Evaluation  $PT Eval Moderate Complexity 1 Mod    Pt admitted secondary to problem above with deficits above. Pt required mod A +2 for bed mobility and min A +2 to stand and take side steps at EOB. Son present during eval and states he and his sister noticed changes in his Dad's speech over the weekend when the falls began on Saturday. Pt has had falls on Saturday, Monday and Tuesday (with staff present) at the facility. Pt states his L side feels "a little weaker". OT reports notifying MD after session. Feel pt would benefit from SNF level therapies at d/c to increase independence and safety prior to return to ALF. Will continue to follow acutely.   Reuel Derby, PT, DPT  Acute Rehabilitation Services  Pager: [phone] Office: [phone]

## 2021-02-22 NOTE — ED Notes (Signed)
Pt is sinus brady on monitor - no CP or SOB - Floor coverage notified per order set

## 2021-02-22 NOTE — ED Notes (Signed)
Pt was unable to use urinal and urine spilled and soaked the bed and pt - this RN changed pt and bed linen

## 2021-02-22 NOTE — Progress Notes (Signed)
PROGRESS NOTE    [name]  IWP:[id number] DOB: 08-10-1934 DOA: 02/21/2021 PCP: Hulan Fess, MD    Brief Narrative:  87 year old gentleman with history of CVA with left hemiparesis, dementia, hypertension, chronic foot drop, type 2 diabetes on insulin, coronary artery disease, CKD with baseline creatinine about 3, history of CABG presented to the emergency room from assisted living facility for flank pain and low back pain, 3 subsequent fall while taking shower and helped by someone.  On arrival to the emergency room, hemodynamically stable.  Leg swelling present.  Skeletal survey consistent with multiple fracture on the left side with no complication.   Assessment & Plan:   Principal Problem:   Multiple rib fractures Active Problems:   Essential hypertension   DM type 2, goal HbA1c < 7% (HCC)   Chronic kidney disease, stage IV (severe) (HCC)   Hypothyroidism   Dementia (HCC)   Depression   History of repair of aneurysm of abdominal aorta using endovascular stent graft   Stasis dermatitis of both legs   Bilateral edema of lower extremity   Rib fractures  Trauma/multiple rib fractures: Uncomplicated.  Still with ongoing pain and nausea. Patient will be admitted to the hospital for pain control, chest physiotherapy and incentive spirometry.  Seen by surgery.  Recommended conservative management.  Adequate pain control, minimize opiates.  History of a stroke with left hemiparesis: Recent frequent fall.  Family reported weakness worse than usual.  We will recheck MRI of the brain to evaluate whether he has any new stroke.  Patient on Plavix that will be continued.  CKD stage IV: Baseline creatinine about 3.  Will monitor on diuretics.  Bilateral lower extremity edema: Multifactorial edema.  Trial of Lasix IV.  Close monitoring of renal function.  Started on compression stockings.  Hypertension: Blood pressure stable.  Type 2 diabetes: Insulin regimen from home.  Will uptitrate  as needed.  Coronary artery disease with history of CABG: Stable.  No evidence of new coronary syndrome.  DVT prophylaxis: enoxaparin (LOVENOX) injection 30 mg Start: 02/21/21 2200   Code Status: Full code. Family Communication: Patient's son at the bedside. Disposition Plan: Status is: Inpatient  Remains inpatient appropriate because:Ongoing active pain requiring inpatient pain management and IV treatments appropriate due to intensity of illness or inability to take PO   Dispo: The patient is from: ALF              Anticipated d/c is to: SNF              Patient currently is not medically stable to d/c.   Difficult to place patient No         Consultants:   , Surgeries  Procedures:   None  Antimicrobials:   None   Subjective: Patient seen and examined.  He was in the emergency room.  His son was at the bedside.  They reported that he fell on his left side even with someone helping him to take shower.  Patient reportedly having more weakness than usual on the left side.  He had just received some pain medication so pain was under control.  He is worried about bruising everywhere.  Objective: Vitals:   02/22/21 0530 02/22/21 0859 02/22/21 0921 02/22/21 0945  BP: (!) 139/59 139/60 132/65 131/66  Pulse: (!) 45 (!) 50 (!) 54 (!) 52  Resp: 13 19 14 15   Temp:      TempSrc:  Oral    SpO2: 95% 100% 100% 98%  Weight:  89.4 kg    Height:  5\' 10"  (1.778 m)     No intake or output data in the 24 hours ending 02/22/21 1424 Filed Weights   02/22/21 0859  Weight: 89.4 kg    Examination:  General exam: Appears calm and comfortable , age-appropriate gentleman.  On room air. Respiratory system: Clear to auscultation. Respiratory effort normal. Slight tenderness on the left posterior chest wall.  Has some superficial bruising. Cardiovascular system: S1 & S2 heard, RRR.  Shiny extremities.  2+ edema both legs. Gastrointestinal system: Abdomen is nondistended, soft and  nontender. No organomegaly or masses felt. Normal bowel sounds heard. Central nervous system: Alert and oriented x2-3.  His left side is weaker than the right 4/5. Skin: No rashes, lesions or ulcers Psychiatry: Judgement and insight appear normal. Mood & affect appropriate.     Data Reviewed: I have personally reviewed following labs and imaging studies  CBC: Recent Labs  Lab 02/21/21 1312 02/22/21 0530  WBC 6.6 5.7  HGB 13.7 12.5*  HCT 41.7 37.5*  MCV 90.1 90.4  PLT 101* 696*   Basic Metabolic Panel: Recent Labs  Lab 02/21/21 1312 02/22/21 0530  NA 137 138  K 3.7 4.1  CL 104 105  CO2 24 24  GLUCOSE 310* 225*  BUN 36* 41*  CREATININE 3.03* 3.27*  CALCIUM 9.5 9.1   GFR: Estimated Creatinine Clearance: 18.9 mL/min (A) (by C-G formula based on SCr of 3.27 mg/dL (H)). Liver Function Tests: Recent Labs  Lab 02/21/21 1312  AST 28  ALT 33  ALKPHOS 63  BILITOT 0.8  PROT 5.5*  ALBUMIN 2.9*   Recent Labs  Lab 02/21/21 1312  LIPASE 77*   No results for input(s): AMMONIA in the last 168 hours. Coagulation Profile: No results for input(s): INR, PROTIME in the last 168 hours. Cardiac Enzymes: No results for input(s): CKTOTAL, CKMB, CKMBINDEX, TROPONINI in the last 168 hours. BNP (last 3 results) No results for input(s): PROBNP in the last 8760 hours. HbA1C: Recent Labs    02/21/21 1312  HGBA1C 10.1*   CBG: Recent Labs  Lab 02/21/21 2352 02/22/21 0824 02/22/21 1342  GLUCAP 310* 188* 227*   Lipid Profile: No results for input(s): CHOL, HDL, LDLCALC, TRIG, CHOLHDL, LDLDIRECT in the last 72 hours. Thyroid Function Tests: No results for input(s): TSH, T4TOTAL, FREET4, T3FREE, THYROIDAB in the last 72 hours. Anemia Panel: No results for input(s): VITAMINB12, FOLATE, FERRITIN, TIBC, IRON, RETICCTPCT in the last 72 hours. Sepsis Labs: No results for input(s): PROCALCITON, LATICACIDVEN in the last 168 hours.  Recent Results (from the past 240 hour(s))  Resp  Panel by RT-PCR (Flu A&B, Covid) Nasopharyngeal Swab     Status: None   Collection Time: 02/21/21  5:11 PM   Specimen: Nasopharyngeal Swab; Nasopharyngeal(NP) swabs in vial transport medium  Result Value Ref Range Status   SARS Coronavirus 2 by RT PCR NEGATIVE NEGATIVE Final    Comment: (NOTE) SARS-CoV-2 target nucleic acids are NOT DETECTED.  The SARS-CoV-2 RNA is generally detectable in upper respiratory specimens during the acute phase of infection. The lowest concentration of SARS-CoV-2 viral copies this assay can detect is 138 copies/mL. A negative result does not preclude SARS-Cov-2 infection and should not be used as the sole basis for treatment or other patient management decisions. A negative result may occur with  improper specimen collection/handling, submission of specimen other than nasopharyngeal swab, presence of viral mutation(s) within the areas targeted by this assay, and inadequate number of  viral copies(<138 copies/mL). A negative result must be combined with clinical observations, patient history, and epidemiological information. The expected result is Negative.  Fact Sheet for Patients:  EntrepreneurPulse.com.au  Fact Sheet for Healthcare Providers:  IncredibleEmployment.be  This test is no t yet approved or cleared by the [place] FDA and  has been authorized for detection and/or diagnosis of SARS-CoV-2 by FDA under an Emergency Use Authorization (EUA). This EUA will remain  in effect (meaning this test can be used) for the duration of the COVID-19 declaration under Section 564(b)(1) of the Act, 21 U.S.C.section 360bbb-3(b)(1), unless the authorization is terminated  or revoked sooner.       Influenza A by PCR NEGATIVE NEGATIVE Final   Influenza B by PCR NEGATIVE NEGATIVE Final    Comment: (NOTE) The Xpert Xpress SARS-CoV-2/FLU/RSV plus assay is intended as an aid in the diagnosis of influenza from Nasopharyngeal  swab specimens and should not be used as a sole basis for treatment. Nasal washings and aspirates are unacceptable for Xpert Xpress SARS-CoV-2/FLU/RSV testing.  Fact Sheet for Patients: EntrepreneurPulse.com.au  Fact Sheet for Healthcare Providers: IncredibleEmployment.be  This test is not yet approved or cleared by the [place] FDA and has been authorized for detection and/or diagnosis of SARS-CoV-2 by FDA under an Emergency Use Authorization (EUA). This EUA will remain in effect (meaning this test can be used) for the duration of the COVID-19 declaration under Section 564(b)(1) of the Act, 21 U.S.C. section 360bbb-3(b)(1), unless the authorization is terminated or revoked.  Performed at [facility], [place] [address], [place], [place] [address]          Radiology Studies: CT ABDOMEN PELVIS WO CONTRAST  Result Date: 02/21/2021 CLINICAL DATA:  Left flank pain after fall in shower EXAM: CT CHEST, ABDOMEN AND PELVIS WITHOUT CONTRAST TECHNIQUE: Multidetector CT imaging of the chest, abdomen and pelvis was performed following the standard protocol without IV contrast. COMPARISON:  09/20/2020 FINDINGS: CT CHEST FINDINGS Cardiovascular: Heart size is normal. No pericardial effusion. Thoracic aorta is nonaneurysmal. Atherosclerotic calcifications of the aorta and coronary arteries. Borderline enlargement of the main pulmonary trunk. Post CABG changes. Mediastinum/Nodes: No axillary, mediastinal, or hilar lymphadenopathy. Left thyroid lobe nodule, which has been previously biopsied. Trachea and esophagus within normal limits. Lungs/Pleura: Lungs are clear. No airspace consolidation, pleural effusion, or pneumothorax. Musculoskeletal: Acute comminuted mildly displaced fracture of the posterior left twelfth rib. Acute minimally displaced fracture of the posterior left eleventh rib. Thoracic vertebral body heights and alignment are maintained without  fracture or static listhesis. No chest wall abnormality. CT ABDOMEN PELVIS FINDINGS Hepatobiliary: Unenhanced liver appears within normal limits. No evidence of a Paddock laceration or perihepatic hematoma. Layering sludge or small stones within the gallbladder lumen. No pericholecystic inflammatory changes by CT. Pancreas: Unremarkable. No pancreatic ductal dilatation or surrounding inflammatory changes. Spleen: No evidence of splenic injury or perisplenic hematoma. Adrenals/Urinary Tract: Adrenal glands are unremarkable. Kidneys are normal, without renal calculi, focal lesion, or hydronephrosis. Bladder is unremarkable. Stomach/Bowel: Small sliding hiatal hernia. Diffuse gastric wall thickening. No dilated loops of bowel to suggest obstruction. Scattered colonic diverticulosis. No focal bowel wall thickening or inflammatory changes are seen. Vascular/Lymphatic: Aortoiliac stent graft remains in place. Native infrarenal abdominal aortic aneurysm sac is stable in size measuring approximately 3.4 cm in AP dimension. No abdominopelvic lymphadenopathy. Reproductive: Mildly enlarged prostate gland. Other: No free fluid. No abdominopelvic fluid collection. No pneumoperitoneum. Tiny fat containing umbilical hernia. Musculoskeletal: No acute osseous abnormality. Pelvic bony ring intact.  Bilateral hips intact without fracture or dislocation. IMPRESSION: 1. Acute comminuted mildly displaced fracture of the posterior left twelfth rib. Acute minimally displaced fracture of the posterior left eleventh rib. No pneumothorax. 2. No evidence of acute traumatic injury within the abdomen or pelvis. 3. Diffuse gastric wall thickening. Correlate for signs and symptoms of gastritis and consider follow-up with endoscopy. 4. Layering sludge or small stones within the gallbladder lumen. No pericholecystic inflammatory changes by CT. 5. Colonic diverticulosis without evidence of acute diverticulitis. 6. Aortoiliac stent graft remains in  place. Native infrarenal abdominal aortic aneurysm sac is stable in size measuring approximately 3.4 cm in AP dimension. 7. Aortic atherosclerosis (ICD10-I70.0). Electronically Signed   By: Davina Poke D.O.   On: 02/21/2021 15:25   CT Head Wo Contrast  Result Date: 02/21/2021 CLINICAL DATA:  Fall in the shower.  Vomiting. EXAM: CT HEAD WITHOUT CONTRAST TECHNIQUE: Contiguous axial images were obtained from the base of the skull through the vertex without intravenous contrast. COMPARISON:  Head CT 08/19/2020 FINDINGS: Brain: Stable degree of atrophy and chronic small vessel ischemia. No intracranial hemorrhage, mass effect, or midline shift. No hydrocephalus. The basilar cisterns are patent. Remote lacunar infarcts in the right basal ganglia and thalamus. No evidence of territorial infarct or acute ischemia. No extra-axial or intracranial fluid collection. Vascular: No hyperdense vessel or unexpected calcification. Skull: No fracture or focal lesion. Sinuses/Orbits: No acute findings. Minimal opacification of lower left mastoid air cells unchanged from prior exam. Small mucous retention cyst and mucosal thickening of left maxillary sinus. Other: None. IMPRESSION: 1. No acute intracranial abnormality. No skull fracture. 2. Stable atrophy and chronic small vessel ischemia. Remote lacunar infarcts in the right basal ganglia and thalamus. Electronically Signed   By: Keith Rake M.D.   On: 02/21/2021 15:19   CT Chest Wo Contrast  Result Date: 02/21/2021 CLINICAL DATA:  Left flank pain after fall in shower EXAM: CT CHEST, ABDOMEN AND PELVIS WITHOUT CONTRAST TECHNIQUE: Multidetector CT imaging of the chest, abdomen and pelvis was performed following the standard protocol without IV contrast. COMPARISON:  09/20/2020 FINDINGS: CT CHEST FINDINGS Cardiovascular: Heart size is normal. No pericardial effusion. Thoracic aorta is nonaneurysmal. Atherosclerotic calcifications of the aorta and coronary arteries.  Borderline enlargement of the main pulmonary trunk. Post CABG changes. Mediastinum/Nodes: No axillary, mediastinal, or hilar lymphadenopathy. Left thyroid lobe nodule, which has been previously biopsied. Trachea and esophagus within normal limits. Lungs/Pleura: Lungs are clear. No airspace consolidation, pleural effusion, or pneumothorax. Musculoskeletal: Acute comminuted mildly displaced fracture of the posterior left twelfth rib. Acute minimally displaced fracture of the posterior left eleventh rib. Thoracic vertebral body heights and alignment are maintained without fracture or static listhesis. No chest wall abnormality. CT ABDOMEN PELVIS FINDINGS Hepatobiliary: Unenhanced liver appears within normal limits. No evidence of a Paddock laceration or perihepatic hematoma. Layering sludge or small stones within the gallbladder lumen. No pericholecystic inflammatory changes by CT. Pancreas: Unremarkable. No pancreatic ductal dilatation or surrounding inflammatory changes. Spleen: No evidence of splenic injury or perisplenic hematoma. Adrenals/Urinary Tract: Adrenal glands are unremarkable. Kidneys are normal, without renal calculi, focal lesion, or hydronephrosis. Bladder is unremarkable. Stomach/Bowel: Small sliding hiatal hernia. Diffuse gastric wall thickening. No dilated loops of bowel to suggest obstruction. Scattered colonic diverticulosis. No focal bowel wall thickening or inflammatory changes are seen. Vascular/Lymphatic: Aortoiliac stent graft remains in place. Native infrarenal abdominal aortic aneurysm sac is stable in size measuring approximately 3.4 cm in AP dimension. No abdominopelvic lymphadenopathy. Reproductive: Mildly enlarged prostate gland.  Other: No free fluid. No abdominopelvic fluid collection. No pneumoperitoneum. Tiny fat containing umbilical hernia. Musculoskeletal: No acute osseous abnormality. Pelvic bony ring intact. Bilateral hips intact without fracture or dislocation. IMPRESSION: 1.  Acute comminuted mildly displaced fracture of the posterior left twelfth rib. Acute minimally displaced fracture of the posterior left eleventh rib. No pneumothorax. 2. No evidence of acute traumatic injury within the abdomen or pelvis. 3. Diffuse gastric wall thickening. Correlate for signs and symptoms of gastritis and consider follow-up with endoscopy. 4. Layering sludge or small stones within the gallbladder lumen. No pericholecystic inflammatory changes by CT. 5. Colonic diverticulosis without evidence of acute diverticulitis. 6. Aortoiliac stent graft remains in place. Native infrarenal abdominal aortic aneurysm sac is stable in size measuring approximately 3.4 cm in AP dimension. 7. Aortic atherosclerosis (ICD10-I70.0). Electronically Signed   By: Davina Poke D.O.   On: 02/21/2021 15:25   CT Cervical Spine Wo Contrast  Result Date: 02/21/2021 CLINICAL DATA:  [place] in shower, nausea, vomiting, pain, neck trauma, initial encounter EXAM: CT CERVICAL SPINE WITHOUT CONTRAST TECHNIQUE: Multidetector CT imaging of the cervical spine was performed without intravenous contrast. Multiplanar CT image reconstructions were also generated. COMPARISON:  03/18/2020 FINDINGS: Alignment: Normal Skull base and vertebrae: Slight osseous demineralization. Scattered disc space narrowing and endplate spur formation. Multilevel facet degenerative changes cervical spine. Vertebral body heights maintained without fracture, subluxation, or bone destruction. Skull base intact. Soft tissues and spinal canal: Prevertebral soft tissues normal thickness. Atherosclerotic calcifications in the carotid systems, proximal great vessels, and at cranial aspect of aortic arch. Large LEFT lobe thyroid nodule 3.0 cm diameter, unchanged since prior exam as well as prior thyroid ultrasound of 2011; this previously underwent FNA under ultrasound guidance, no further workup recommended. This has been evaluated on previous imaging. (ref: J Am Coll  Radiol. 2015 Feb;12(2): 143-50). Disc levels: Scattered endplate spur formation. No focal disc abnormalities. AP narrowing of spinal canal by endplate spurs and accompanying mild disc bulges. Upper chest: Lung apices clear Other: N/A IMPRESSION: Multilevel degenerative disc and facet disease changes of the cervical spine as above. No acute cervical spine abnormalities. Aortic Atherosclerosis (ICD10-I70.0). Electronically Signed   By: Lavonia Dana M.D.   On: 02/21/2021 15:41   DG CHEST PORT 1 VIEW  Result Date: 02/22/2021 CLINICAL DATA:  Rib fractures EXAM: PORTABLE CHEST 1 VIEW COMPARISON:  Chest CT from yesterday FINDINGS: Rib fractures better demonstrated by CT. No visible effusion or pneumothorax. Lung volumes are low; no demonstrable infiltrate or atelectasis. Stable heart size and mediastinal contours distorted by rightward rotation. CABG. IMPRESSION: Low volume chest with symmetric aeration. Electronically Signed   By: Monte Fantasia M.D.   On: 02/22/2021 05:51        Scheduled Meds: . acetaminophen  650 mg Oral Q6H  . atorvastatin  40 mg Oral Daily  . calcitRIOL  0.25 mcg Oral Daily  . clopidogrel  75 mg Oral Daily  . donepezil  10 mg Oral Daily  . DULoxetine  30 mg Oral Daily  . enoxaparin (LOVENOX) injection  30 mg Subcutaneous Q24H  . furosemide  40 mg Intravenous Q12H  . gabapentin  100 mg Oral BID  . insulin aspart  0-15 Units Subcutaneous TID WC  . insulin aspart  0-5 Units Subcutaneous QHS  . insulin glargine  20 Units Subcutaneous QHS  . levothyroxine  75 mcg Oral QAC breakfast  . lidocaine  1 patch Transdermal Q24H  . methocarbamol  500 mg Oral TID  . metoprolol succinate  25 mg Oral Daily  . multivitamin with minerals  1 tablet Oral Daily  . niacin  1,000 mg Oral BID  . saccharomyces boulardii  250 mg Oral BID  . sertraline  50 mg Oral Daily  . sodium chloride flush  3 mL Intravenous Q12H  . triamcinolone 0.1 % cream : eucerin   Topical BID   Continuous  Infusions: . sodium chloride       LOS: 0 days    Time spent: 25 minutes    Barb Merino, MD [facility] Pager [phone]

## 2021-02-22 NOTE — ED Notes (Signed)
This RN placed pt on primofit and hooked up to suction canister. This RN instructed pt on how to use call light and to use it if he feels like he is wet or needs anything else

## 2021-02-23 DIAGNOSIS — E039 Hypothyroidism, unspecified: Secondary | ICD-10-CM

## 2021-02-23 LAB — BASIC METABOLIC PANEL
Anion gap: 7 (ref 5–15)
BUN: 41 mg/dL — ABNORMAL HIGH (ref 8–23)
CO2: 30 mmol/L (ref 22–32)
Calcium: 9.2 mg/dL (ref 8.9–10.3)
Chloride: 102 mmol/L (ref 98–111)
Creatinine, Ser: 3.19 mg/dL — ABNORMAL HIGH (ref 0.61–1.24)
GFR, Estimated: 18 mL/min — ABNORMAL LOW (ref >60)
Glucose, Bld: 257 mg/dL — ABNORMAL HIGH (ref 70–99)
Potassium: 3.5 mmol/L (ref 3.5–5.1)
Sodium: 139 mmol/L (ref 135–145)

## 2021-02-23 LAB — GLUCOSE, CAPILLARY
Glucose-Capillary: 181 mg/dL — ABNORMAL HIGH (ref 70–99)
Glucose-Capillary: 191 mg/dL — ABNORMAL HIGH (ref 70–99)
Glucose-Capillary: 273 mg/dL — ABNORMAL HIGH (ref 70–99)
Glucose-Capillary: 345 mg/dL — ABNORMAL HIGH (ref 70–99)
Glucose-Capillary: 378 mg/dL — ABNORMAL HIGH (ref 70–99)

## 2021-02-23 MED ORDER — INSULIN GLARGINE 100 UNIT/ML ~~LOC~~ SOLN
25.0000 [IU] | Freq: Every day | SUBCUTANEOUS | Status: DC
  Administered 2021-02-23: 25 [IU] via SUBCUTANEOUS
  Filled 2021-02-23 (×3): qty 0.25

## 2021-02-23 MED ORDER — FUROSEMIDE 40 MG PO TABS
40.0000 mg | ORAL_TABLET | Freq: Every day | ORAL | Status: DC
  Administered 2021-02-23 – 2021-02-24 (×2): 40 mg via ORAL
  Filled 2021-02-23 (×2): qty 1

## 2021-02-23 MED ORDER — LIDOCAINE 5 % EX PTCH: 1.0000 | MEDICATED_PATCH | CUTANEOUS | 0 refills | Status: AC

## 2021-02-23 MED ORDER — TRAMADOL HCL 50 MG PO TABS: 50.0000 mg | ORAL_TABLET | Freq: Four times a day (QID) | ORAL | 0 refills | Status: AC | PRN

## 2021-02-23 NOTE — Progress Notes (Signed)
Inpatient Diabetes Program Recommendations  AACE/ADA: New Consensus Statement on Inpatient Glycemic Control (2015)  Target Ranges:  Prepandial:   less than 140 mg/dL      Peak postprandial:   less than 180 mg/dL (1-2 hours)      Critically ill patients:  140 - 180 mg/dL   Lab Results  Component Value Date   GLUCAP 181 (H) 02/23/2021   HGBA1C 10.1 (H) 02/21/2021    Review of Glycemic Control Results for [name], [name] (MRN [id number]) as of 02/23/2021 11:21  Ref. Range 02/22/2021 08:24 02/22/2021 13:42 02/22/2021 17:47 02/22/2021 21:45 02/23/2021 08:01  Glucose-Capillary Latest Ref Range: 70 - 99 mg/dL 188 (H) 227 (H) 244 (H) 212 (H) 181 (H)   Diabetes history: DM 2 Outpatient Diabetes mdications: Lantus 30 units qhs, Novolog 0-15 units tid Current orders for Inpatient glycemic control:  Lantus 20 units Novolog 0-15 units tid + hs  Inpatient Diabetes Program Recommendations:    -  Increase Lantus closer to home dose, 25 units.  Thanks,  Tama Headings RN, MSN, BC-ADM Inpatient Diabetes Coordinator Team Pager [phone] (8a-5p)

## 2021-02-23 NOTE — Discharge Summary (Addendum)
Physician Discharge Summary  [name] SWF:[id number] DOB: [birth year] DOA: 02/21/2021  PCP: Hulan Fess, MD  Admit date: 02/21/2021 Discharge date: 02/24/2021  Admitted From: Assisted living facility Disposition: Skilled nursing facility  Recommendations for Outpatient Follow-up:  1. Follow up with PCP in 1-2 weeks after discharge 2. Please obtain BMP/CBC in one week 3. Follow-up with nephrology as scheduled  Home Health: Not applicable.  Going to a skilled nursing facility. Equipment/Devices: Not applicable  Discharge Condition: Fair CODE STATUS: DNR Diet recommendation: Low-salt and low-carb diet.  Discharge summary: 87 year old gentleman with history of CVA with left hemiparesis, dementia, hypertension, chronic left foot drop, type 2 diabetes on insulin, coronary artery disease, CKD4 with baseline creatinine about 3, history of CABG presented to the emergency room from assisted living facility for flank pain and low back pain, 3 subsequent fall while taking shower and helped by someone.  On arrival to the emergency room, hemodynamically stable.  Leg swelling present.  Skeletal survey consistent with multiple fracture on the left side with no complication.   Assessment & Plan of care:  Trauma/multiple rib fractures: Uncomplicated.  Still with ongoing pain and nausea. Trauma surgery recommended conservative management.  Occasionally used IV pain medicine, however currently well maintained on oral tramadol, lidocaine patches.  Is participating in chest physiotherapy as much possible.    History of stroke with left hemiparesis: Recent frequent fall.  Reportedly more weaker than usual.  MRI of the brain was done that did not show any new stroke.  Patient is on Plavix that is continued.  CKD stage IV: Baseline creatinine about 3.  He does have well-established follow-up with [facility].  Bilateral lower extremity edema: Multifactorial edema.  Trial of Lasix IV with some  improvement.  Bilateral compression stockings.  Hypertension: Blood pressure stable.  Type 2 diabetes: Insulin regimen from home.    Coronary artery disease with history of CABG: Stable.  No evidence of new coronary syndrome.  Goal of care: Met with patient and family at bedside.  Encouraged him to discuss about advanced directives. Introduced MOST form, CPR instructions.   Patient is DNR.  Medically stable to transfer to skilled level of care to continue attempt to rehab and possible back to assisted living facility.     Discharge Diagnoses:  Principal Problem:   Multiple rib fractures Active Problems:   Essential hypertension   DM type 2, goal HbA1c < 7% (HCC)   Chronic kidney disease, stage IV (severe) (HCC)   Hypothyroidism   Dementia (HCC)   Depression   History of repair of aneurysm of abdominal aorta using endovascular stent graft   Stasis dermatitis of both legs   Bilateral edema of lower extremity   Rib fractures    Discharge Instructions  Discharge Instructions    Call MD for:  difficulty breathing, headache or visual disturbances   Complete by: As directed    Call MD for:  severe uncontrolled pain   Complete by: As directed    Diet - low sodium heart healthy   Complete by: As directed    Diet Carb Modified   Complete by: As directed    Discharge instructions   Complete by: As directed    All time fall precautions  Continue doing breathing exercises, incentive spirometry   Increase activity slowly   Complete by: As directed      Allergies as of 02/24/2021      Reactions   Nsaids    Other reaction(s): CKD  Medication List    TAKE these medications   acetaminophen 325 MG tablet Commonly known as: TYLENOL Take 650 mg by mouth every 6 (six) hours as needed for mild pain, fever or headache.   atorvastatin 40 MG tablet Commonly known as: LIPITOR Take 40 mg by mouth daily.   calcitRIOL 0.25 MCG capsule Commonly known as: ROCALTROL Take  0.25 mcg by mouth in the morning and at bedtime.   clopidogrel 75 MG tablet Commonly known as: PLAVIX Take 1 tablet (75 mg total) by mouth daily.   donepezil 10 MG tablet Commonly known as: ARICEPT TAKE 1 TABLET BY MOUTH ONCE DAILY AT BEDTIME What changed: when to take this   DULoxetine 30 MG capsule Commonly known as: Cymbalta Take 1 capsule (30 mg total) by mouth at bedtime. What changed: when to take this   furosemide 80 MG tablet Commonly known as: LASIX Take 0.5 tablets (40 mg total) by mouth daily.   gabapentin 100 MG capsule Commonly known as: NEURONTIN Take 100 mg by mouth 2 (two) times daily.   insulin aspart 100 UNIT/ML injection Commonly known as: novoLOG Inject 0-15 Units into the skin 3 (three) times daily before meals. Sliding scale   ketoconazole 2 % cream Commonly known as: NIZORAL Apply 1 application topically daily.   Lantus SoloStar 100 UNIT/ML Solostar Pen Generic drug: insulin glargine Inject 25 Units into the skin daily. What changed:   how much to take  when to take this   levothyroxine 75 MCG tablet Commonly known as: SYNTHROID Take 75 mcg by mouth daily before breakfast.   lidocaine 5 % Commonly known as: LIDODERM Place 1 patch onto the skin daily. Remove & Discard patch within 12 hours or as directed by MD   loratadine 10 MG tablet Commonly known as: CLARITIN Take 10 mg by mouth daily as needed for allergies.   metoCLOPramide 10 MG tablet Commonly known as: Reglan Take 1 tablet (10 mg total) by mouth every 6 (six) hours as needed for nausea (nausea/headache).   metoprolol succinate 25 MG 24 hr tablet Commonly known as: TOPROL-XL Take 1 tablet (25 mg total) by mouth daily. Hold if HR is less than 55 What changed: additional instructions   MULTIVITAMIN ADULTS PO Take 1 tablet by mouth daily.   niacin 1000 MG CR tablet Commonly known as: NIASPAN Take 1,000 mg by mouth 2 (two) times daily.   OneTouch Verio test strip Generic  drug: glucose blood   polyethylene glycol 17 g packet Commonly known as: MIRALAX / GLYCOLAX 1 packet mixed with 8 ounces of fluid   potassium chloride 10 MEQ tablet Commonly known as: KLOR-CON Take 1 tablet (10 mEq total) by mouth daily.   RELION INSULIN SYRINGE 1ML/31G 31G X 5/16" 1 ML Misc Generic drug: Insulin Syringe-Needle U-100 USE AS DIRECTED FIVE TIMES DAILY   saccharomyces boulardii 250 MG capsule Commonly known as: FLORASTOR Take 1 capsule (250 mg total) by mouth 2 (two) times daily.   sertraline 50 MG tablet Commonly known as: ZOLOFT Take 50 mg by mouth daily.   traMADol 50 MG tablet Commonly known as: ULTRAM Take 1 tablet (50 mg total) by mouth every 6 (six) hours as needed for up to 5 days for moderate pain or severe pain.   valACYclovir 500 MG tablet Commonly known as: VALTREX Take 500 mg by mouth daily as needed (out break).       Follow-up Information    Little, Lennette Bihari, MD Follow up in 2 week(s).  Specialty: Family Medicine Contact information: [place] [address] [phone]              Allergies  Allergen Reactions  . Nsaids     Other reaction(s): CKD    Consultations:  Trauma surgery   Procedures/Studies: CT ABDOMEN PELVIS WO CONTRAST  Result Date: 02/21/2021 CLINICAL DATA:  Left flank pain after fall in shower EXAM: CT CHEST, ABDOMEN AND PELVIS WITHOUT CONTRAST TECHNIQUE: Multidetector CT imaging of the chest, abdomen and pelvis was performed following the standard protocol without IV contrast. COMPARISON:  09/20/2020 FINDINGS: CT CHEST FINDINGS Cardiovascular: Heart size is normal. No pericardial effusion. Thoracic aorta is nonaneurysmal. Atherosclerotic calcifications of the aorta and coronary arteries. Borderline enlargement of the main pulmonary trunk. Post CABG changes. Mediastinum/Nodes: No axillary, mediastinal, or hilar lymphadenopathy. Left thyroid lobe nodule, which has been previously biopsied. Trachea  and esophagus within normal limits. Lungs/Pleura: Lungs are clear. No airspace consolidation, pleural effusion, or pneumothorax. Musculoskeletal: Acute comminuted mildly displaced fracture of the posterior left twelfth rib. Acute minimally displaced fracture of the posterior left eleventh rib. Thoracic vertebral body heights and alignment are maintained without fracture or static listhesis. No chest wall abnormality. CT ABDOMEN PELVIS FINDINGS Hepatobiliary: Unenhanced liver appears within normal limits. No evidence of a Paddock laceration or perihepatic hematoma. Layering sludge or small stones within the gallbladder lumen. No pericholecystic inflammatory changes by CT. Pancreas: Unremarkable. No pancreatic ductal dilatation or surrounding inflammatory changes. Spleen: No evidence of splenic injury or perisplenic hematoma. Adrenals/Urinary Tract: Adrenal glands are unremarkable. Kidneys are normal, without renal calculi, focal lesion, or hydronephrosis. Bladder is unremarkable. Stomach/Bowel: Small sliding hiatal hernia. Diffuse gastric wall thickening. No dilated loops of bowel to suggest obstruction. Scattered colonic diverticulosis. No focal bowel wall thickening or inflammatory changes are seen. Vascular/Lymphatic: Aortoiliac stent graft remains in place. Native infrarenal abdominal aortic aneurysm sac is stable in size measuring approximately 3.4 cm in AP dimension. No abdominopelvic lymphadenopathy. Reproductive: Mildly enlarged prostate gland. Other: No free fluid. No abdominopelvic fluid collection. No pneumoperitoneum. Tiny fat containing umbilical hernia. Musculoskeletal: No acute osseous abnormality. Pelvic bony ring intact. Bilateral hips intact without fracture or dislocation. IMPRESSION: 1. Acute comminuted mildly displaced fracture of the posterior left twelfth rib. Acute minimally displaced fracture of the posterior left eleventh rib. No pneumothorax. 2. No evidence of acute traumatic injury within  the abdomen or pelvis. 3. Diffuse gastric wall thickening. Correlate for signs and symptoms of gastritis and consider follow-up with endoscopy. 4. Layering sludge or small stones within the gallbladder lumen. No pericholecystic inflammatory changes by CT. 5. Colonic diverticulosis without evidence of acute diverticulitis. 6. Aortoiliac stent graft remains in place. Native infrarenal abdominal aortic aneurysm sac is stable in size measuring approximately 3.4 cm in AP dimension. 7. Aortic atherosclerosis (ICD10-I70.0). Electronically Signed   By: Davina Poke D.O.   On: 02/21/2021 15:25   CT Head Wo Contrast  Result Date: 02/21/2021 CLINICAL DATA:  Fall in the shower.  Vomiting. EXAM: CT HEAD WITHOUT CONTRAST TECHNIQUE: Contiguous axial images were obtained from the base of the skull through the vertex without intravenous contrast. COMPARISON:  Head CT 08/19/2020 FINDINGS: Brain: Stable degree of atrophy and chronic small vessel ischemia. No intracranial hemorrhage, mass effect, or midline shift. No hydrocephalus. The basilar cisterns are patent. Remote lacunar infarcts in the right basal ganglia and thalamus. No evidence of territorial infarct or acute ischemia. No extra-axial or intracranial fluid collection. Vascular: No hyperdense vessel or unexpected calcification. Skull: No fracture  or focal lesion. Sinuses/Orbits: No acute findings. Minimal opacification of lower left mastoid air cells unchanged from prior exam. Small mucous retention cyst and mucosal thickening of left maxillary sinus. Other: None. IMPRESSION: 1. No acute intracranial abnormality. No skull fracture. 2. Stable atrophy and chronic small vessel ischemia. Remote lacunar infarcts in the right basal ganglia and thalamus. Electronically Signed   By: Keith Rake M.D.   On: 02/21/2021 15:19   CT Chest Wo Contrast  Result Date: 02/21/2021 CLINICAL DATA:  Left flank pain after fall in shower EXAM: CT CHEST, ABDOMEN AND PELVIS WITHOUT  CONTRAST TECHNIQUE: Multidetector CT imaging of the chest, abdomen and pelvis was performed following the standard protocol without IV contrast. COMPARISON:  09/20/2020 FINDINGS: CT CHEST FINDINGS Cardiovascular: Heart size is normal. No pericardial effusion. Thoracic aorta is nonaneurysmal. Atherosclerotic calcifications of the aorta and coronary arteries. Borderline enlargement of the main pulmonary trunk. Post CABG changes. Mediastinum/Nodes: No axillary, mediastinal, or hilar lymphadenopathy. Left thyroid lobe nodule, which has been previously biopsied. Trachea and esophagus within normal limits. Lungs/Pleura: Lungs are clear. No airspace consolidation, pleural effusion, or pneumothorax. Musculoskeletal: Acute comminuted mildly displaced fracture of the posterior left twelfth rib. Acute minimally displaced fracture of the posterior left eleventh rib. Thoracic vertebral body heights and alignment are maintained without fracture or static listhesis. No chest wall abnormality. CT ABDOMEN PELVIS FINDINGS Hepatobiliary: Unenhanced liver appears within normal limits. No evidence of a Paddock laceration or perihepatic hematoma. Layering sludge or small stones within the gallbladder lumen. No pericholecystic inflammatory changes by CT. Pancreas: Unremarkable. No pancreatic ductal dilatation or surrounding inflammatory changes. Spleen: No evidence of splenic injury or perisplenic hematoma. Adrenals/Urinary Tract: Adrenal glands are unremarkable. Kidneys are normal, without renal calculi, focal lesion, or hydronephrosis. Bladder is unremarkable. Stomach/Bowel: Small sliding hiatal hernia. Diffuse gastric wall thickening. No dilated loops of bowel to suggest obstruction. Scattered colonic diverticulosis. No focal bowel wall thickening or inflammatory changes are seen. Vascular/Lymphatic: Aortoiliac stent graft remains in place. Native infrarenal abdominal aortic aneurysm sac is stable in size measuring approximately 3.4 cm  in AP dimension. No abdominopelvic lymphadenopathy. Reproductive: Mildly enlarged prostate gland. Other: No free fluid. No abdominopelvic fluid collection. No pneumoperitoneum. Tiny fat containing umbilical hernia. Musculoskeletal: No acute osseous abnormality. Pelvic bony ring intact. Bilateral hips intact without fracture or dislocation. IMPRESSION: 1. Acute comminuted mildly displaced fracture of the posterior left twelfth rib. Acute minimally displaced fracture of the posterior left eleventh rib. No pneumothorax. 2. No evidence of acute traumatic injury within the abdomen or pelvis. 3. Diffuse gastric wall thickening. Correlate for signs and symptoms of gastritis and consider follow-up with endoscopy. 4. Layering sludge or small stones within the gallbladder lumen. No pericholecystic inflammatory changes by CT. 5. Colonic diverticulosis without evidence of acute diverticulitis. 6. Aortoiliac stent graft remains in place. Native infrarenal abdominal aortic aneurysm sac is stable in size measuring approximately 3.4 cm in AP dimension. 7. Aortic atherosclerosis (ICD10-I70.0). Electronically Signed   By: Davina Poke D.O.   On: 02/21/2021 15:25   CT Cervical Spine Wo Contrast  Result Date: 02/21/2021 CLINICAL DATA:  [place] in shower, nausea, vomiting, pain, neck trauma, initial encounter EXAM: CT CERVICAL SPINE WITHOUT CONTRAST TECHNIQUE: Multidetector CT imaging of the cervical spine was performed without intravenous contrast. Multiplanar CT image reconstructions were also generated. COMPARISON:  03/18/2020 FINDINGS: Alignment: Normal Skull base and vertebrae: Slight osseous demineralization. Scattered disc space narrowing and endplate spur formation. Multilevel facet degenerative changes cervical spine. Vertebral body heights maintained without fracture,  subluxation, or bone destruction. Skull base intact. Soft tissues and spinal canal: Prevertebral soft tissues normal thickness. Atherosclerotic  calcifications in the carotid systems, proximal great vessels, and at cranial aspect of aortic arch. Large LEFT lobe thyroid nodule 3.0 cm diameter, unchanged since prior exam as well as prior thyroid ultrasound of 2011; this previously underwent FNA under ultrasound guidance, no further workup recommended. This has been evaluated on previous imaging. (ref: J Am Coll Radiol. 2015 Feb;12(2): 143-50). Disc levels: Scattered endplate spur formation. No focal disc abnormalities. AP narrowing of spinal canal by endplate spurs and accompanying mild disc bulges. Upper chest: Lung apices clear Other: N/A IMPRESSION: Multilevel degenerative disc and facet disease changes of the cervical spine as above. No acute cervical spine abnormalities. Aortic Atherosclerosis (ICD10-I70.0). Electronically Signed   By: Lavonia Dana M.D.   On: 02/21/2021 15:41   MR BRAIN WO CONTRAST  Result Date: 02/22/2021 CLINICAL DATA:  Stroke suspected. EXAM: MRI HEAD WITHOUT CONTRAST TECHNIQUE: Multiplanar, multiecho pulse sequences of the brain and surrounding structures were obtained without intravenous contrast. COMPARISON:  CT head February 21, 2021.  MRI August 20, 2020. FINDINGS: Brain: No acute infarct. Remote lacunar infarcts involving the right basal ganglia and right thalamocapsular region. Mild to moderate for age scattered additional T2/FLAIR hyperintensities, most likely related to chronic microvascular ischemic disease. Focus of susceptibility artifact in the right cerebellum and left occipital lobe, likely related to prior hemorrhage. Vascular: Major arterial flow voids are maintained at the skull base. 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 Skull and upper cervical spine: Normal marrow signal. Sinuses/Orbits: Small left maxillary sinus retention cyst. Otherwise, sinuses are largely clear. Unremarkable orbits. Other: Small left and trace right mastoid effusions. IMPRESSION: 1. No evidence of acute intracranial  abnormality.  No acute infarct. 2. Similar chronic findings, as detailed above. Electronically Signed   By: Margaretha Sheffield MD   On: 02/22/2021 17:37   DG CHEST PORT 1 VIEW  Result Date: 02/22/2021 CLINICAL DATA:  Rib fractures EXAM: PORTABLE CHEST 1 VIEW COMPARISON:  Chest CT from yesterday FINDINGS: Rib fractures better demonstrated by CT. No visible effusion or pneumothorax. Lung volumes are low; no demonstrable infiltrate or atelectasis. Stable heart size and mediastinal contours distorted by rightward rotation. CABG. IMPRESSION: Low volume chest with symmetric aeration. Electronically Signed   By: Monte Fantasia M.D.   On: 02/22/2021 05:51   (Echo, Carotid, EGD, Colonoscopy, ERCP)    Subjective: Patient seen and examined.  His daughter was at the bedside.  Patient had moderate pain on the left lower back and flank but able to take deep breathing.  Denies any complaints.   Discharge Exam: Vitals:   02/24/21 0442 02/24/21 1010  BP: (!) 133/58 (!) 139/52  Pulse: (!) 52 (!) 53  Resp: 16 18  Temp: (!) 97.4 F (36.3 C) 97.9 F (36.6 C)  SpO2: 100% 98%   Vitals:   02/23/21 1355 02/23/21 2058 02/24/21 0442 02/24/21 1010  BP: (!) 145/59 123/61 (!) 133/58 (!) 139/52  Pulse: (!) 57 (!) 55 (!) 52 (!) 53  Resp: 16 17 16 18   Temp: 97.9 F (36.6 C) 98.2 F (36.8 C) (!) 97.4 F (36.3 C) 97.9 F (36.6 C)  TempSrc:  Oral Oral Oral  SpO2: 100% 99% 100% 98%  Weight:      Height:        General: Pt is alert, awake, not in acute distress Frail looking.  He is appropriate for age. Cardiovascular: RRR, S1/S2 +, no rubs, no  gallops He does have some ecchymosis over the left flank and lower back, no palpable fracture. Respiratory: CTA bilaterally, no wheezing, no rhonchi Abdominal: Soft, NT, ND, bowel sounds + Extremities: 2+ edema both legs. Left side is weaker than the right.  Both upper and lower extremity 4/5.    The results of significant diagnostics from this hospitalization  (including imaging, microbiology, ancillary and laboratory) are listed below for reference.     Microbiology: Recent Results (from the past 240 hour(s))  Resp Panel by RT-PCR (Flu A&B, Covid) Nasopharyngeal Swab     Status: None   Collection Time: 02/21/21  5:11 PM   Specimen: Nasopharyngeal Swab; Nasopharyngeal(NP) swabs in vial transport medium  Result Value Ref Range Status   SARS Coronavirus 2 by RT PCR NEGATIVE NEGATIVE Final    Comment: (NOTE) SARS-CoV-2 target nucleic acids are NOT DETECTED.  The SARS-CoV-2 RNA is generally detectable in upper respiratory specimens during the acute phase of infection. The lowest concentration of SARS-CoV-2 viral copies this assay can detect is 138 copies/mL. A negative result does not preclude SARS-Cov-2 infection and should not be used as the sole basis for treatment or other patient management decisions. A negative result may occur with  improper specimen collection/handling, submission of specimen other than nasopharyngeal swab, presence of viral mutation(s) within the areas targeted by this assay, and inadequate number of viral copies(<138 copies/mL). A negative result must be combined with clinical observations, patient history, and epidemiological information. The expected result is Negative.  Fact Sheet for Patients:  EntrepreneurPulse.com.au  Fact Sheet for Healthcare Providers:  IncredibleEmployment.be  This test is no t yet approved or cleared by the [place] FDA and  has been authorized for detection and/or diagnosis of SARS-CoV-2 by FDA under an Emergency Use Authorization (EUA). This EUA will remain  in effect (meaning this test can be used) for the duration of the COVID-19 declaration under Section 564(b)(1) of the Act, 21 U.S.C.section 360bbb-3(b)(1), unless the authorization is terminated  or revoked sooner.       Influenza A by PCR NEGATIVE NEGATIVE Final   Influenza B by PCR  NEGATIVE NEGATIVE Final    Comment: (NOTE) The Xpert Xpress SARS-CoV-2/FLU/RSV plus assay is intended as an aid in the diagnosis of influenza from Nasopharyngeal swab specimens and should not be used as a sole basis for treatment. Nasal washings and aspirates are unacceptable for Xpert Xpress SARS-CoV-2/FLU/RSV testing.  Fact Sheet for Patients: EntrepreneurPulse.com.au  Fact Sheet for Healthcare Providers: IncredibleEmployment.be  This test is not yet approved or cleared by the [place] FDA and has been authorized for detection and/or diagnosis of SARS-CoV-2 by FDA under an Emergency Use Authorization (EUA). This EUA will remain in effect (meaning this test can be used) for the duration of the COVID-19 declaration under Section 564(b)(1) of the Act, 21 U.S.C. section 360bbb-3(b)(1), unless the authorization is terminated or revoked.  Performed at [facility], [place] [address], [place], [place] [address]   SARS CORONAVIRUS 2 (TAT 6-24 HRS) Nasopharyngeal Nasopharyngeal Swab     Status: None   Collection Time: 02/23/21  2:50 PM   Specimen: Nasopharyngeal Swab  Result Value Ref Range Status   SARS Coronavirus 2 NEGATIVE NEGATIVE Final    Comment: (NOTE) SARS-CoV-2 target nucleic acids are NOT DETECTED.  The SARS-CoV-2 RNA is generally detectable in upper and lower respiratory specimens during the acute phase of infection. Negative results do not preclude SARS-CoV-2 infection, do not rule out co-infections with other pathogens, and  should not be used as the sole basis for treatment or other patient management decisions. Negative results must be combined with clinical observations, patient history, and epidemiological information. The expected result is Negative.  Fact Sheet for Patients: SugarRoll.be  Fact Sheet for Healthcare Providers: https://www.woods-mathews.com/  This test is not  yet approved or cleared by the [place] FDA and  has been authorized for detection and/or diagnosis of SARS-CoV-2 by FDA under an Emergency Use Authorization (EUA). This EUA will remain  in effect (meaning this test can be used) for the duration of the COVID-19 declaration under Se ction 564(b)(1) of the Act, 21 U.S.C. section 360bbb-3(b)(1), unless the authorization is terminated or revoked sooner.  Performed at [facility], [place] [address], [place], [place] [address]      Labs: BNP (last 3 results) No results for input(s): BNP in the last 8760 hours. Basic Metabolic Panel: Recent Labs  Lab 02/21/21 1312 02/22/21 0530 02/23/21 0055 02/24/21 0002  NA 137 138 139 135  K 3.7 4.1 3.5 3.5  CL 104 105 102 99  CO2 24 24 30 29   GLUCOSE 310* 225* 257* 304*  BUN 36* 41* 41* 43*  CREATININE 3.03* 3.27* 3.19* 3.02*  CALCIUM 9.5 9.1 9.2 8.8*   Liver Function Tests: Recent Labs  Lab 02/21/21 1312  AST 28  ALT 33  ALKPHOS 63  BILITOT 0.8  PROT 5.5*  ALBUMIN 2.9*   Recent Labs  Lab 02/21/21 1312  LIPASE 77*   No results for input(s): AMMONIA in the last 168 hours. CBC: Recent Labs  Lab 02/21/21 1312 02/22/21 0530  WBC 6.6 5.7  HGB 13.7 12.5*  HCT 41.7 37.5*  MCV 90.1 90.4  PLT 101* 102*   Cardiac Enzymes: No results for input(s): CKTOTAL, CKMB, CKMBINDEX, TROPONINI in the last 168 hours. BNP: Invalid input(s): POCBNP CBG: Recent Labs  Lab 02/23/21 1655 02/23/21 2019 02/23/21 2102 02/24/21 0757 02/24/21 1154  GLUCAP 273* 345* 378* 202* 194*   D-Dimer No results for input(s): DDIMER in the last 72 hours. Hgb A1c Recent Labs    02/21/21 1312  HGBA1C 10.1*   Lipid Profile No results for input(s): CHOL, HDL, LDLCALC, TRIG, CHOLHDL, LDLDIRECT in the last 72 hours. Thyroid function studies No results for input(s): TSH, T4TOTAL, T3FREE, THYROIDAB in the last 72 hours.  Invalid input(s): FREET3 Anemia work up No results for input(s):  VITAMINB12, FOLATE, FERRITIN, TIBC, IRON, RETICCTPCT in the last 72 hours. Urinalysis    Component Value Date/Time   COLORURINE YELLOW 02/22/2021 0640   APPEARANCEUR CLEAR 02/22/2021 0640   LABSPEC 1.011 02/22/2021 0640   PHURINE 5.0 02/22/2021 0640   GLUCOSEU 150 (A) 02/22/2021 0640   HGBUR NEGATIVE 02/22/2021 0640   BILIRUBINUR NEGATIVE 02/22/2021 0640   KETONESUR NEGATIVE 02/22/2021 0640   PROTEINUR 30 (A) 02/22/2021 0640   NITRITE NEGATIVE 02/22/2021 0640   LEUKOCYTESUR SMALL (A) 02/22/2021 0640   Sepsis Labs Invalid input(s): PROCALCITONIN,  WBC,  LACTICIDVEN Microbiology Recent Results (from the past 240 hour(s))  Resp Panel by RT-PCR (Flu A&B, Covid) Nasopharyngeal Swab     Status: None   Collection Time: 02/21/21  5:11 PM   Specimen: Nasopharyngeal Swab; Nasopharyngeal(NP) swabs in vial transport medium  Result Value Ref Range Status   SARS Coronavirus 2 by RT PCR NEGATIVE NEGATIVE Final    Comment: (NOTE) SARS-CoV-2 target nucleic acids are NOT DETECTED.  The SARS-CoV-2 RNA is generally detectable in upper respiratory specimens during the acute phase of infection. The  lowest concentration of SARS-CoV-2 viral copies this assay can detect is 138 copies/mL. A negative result does not preclude SARS-Cov-2 infection and should not be used as the sole basis for treatment or other patient management decisions. A negative result may occur with  improper specimen collection/handling, submission of specimen other than nasopharyngeal swab, presence of viral mutation(s) within the areas targeted by this assay, and inadequate number of viral copies(<138 copies/mL). A negative result must be combined with clinical observations, patient history, and epidemiological information. The expected result is Negative.  Fact Sheet for Patients:  EntrepreneurPulse.com.au  Fact Sheet for Healthcare Providers:  IncredibleEmployment.be  This test is no t  yet approved or cleared by the [place] FDA and  has been authorized for detection and/or diagnosis of SARS-CoV-2 by FDA under an Emergency Use Authorization (EUA). This EUA will remain  in effect (meaning this test can be used) for the duration of the COVID-19 declaration under Section 564(b)(1) of the Act, 21 U.S.C.section 360bbb-3(b)(1), unless the authorization is terminated  or revoked sooner.       Influenza A by PCR NEGATIVE NEGATIVE Final   Influenza B by PCR NEGATIVE NEGATIVE Final    Comment: (NOTE) The Xpert Xpress SARS-CoV-2/FLU/RSV plus assay is intended as an aid in the diagnosis of influenza from Nasopharyngeal swab specimens and should not be used as a sole basis for treatment. Nasal washings and aspirates are unacceptable for Xpert Xpress SARS-CoV-2/FLU/RSV testing.  Fact Sheet for Patients: EntrepreneurPulse.com.au  Fact Sheet for Healthcare Providers: IncredibleEmployment.be  This test is not yet approved or cleared by the [place] FDA and has been authorized for detection and/or diagnosis of SARS-CoV-2 by FDA under an Emergency Use Authorization (EUA). This EUA will remain in effect (meaning this test can be used) for the duration of the COVID-19 declaration under Section 564(b)(1) of the Act, 21 U.S.C. section 360bbb-3(b)(1), unless the authorization is terminated or revoked.  Performed at [facility], [place] [address], [place], [place] [address]   SARS CORONAVIRUS 2 (TAT 6-24 HRS) Nasopharyngeal Nasopharyngeal Swab     Status: None   Collection Time: 02/23/21  2:50 PM   Specimen: Nasopharyngeal Swab  Result Value Ref Range Status   SARS Coronavirus 2 NEGATIVE NEGATIVE Final    Comment: (NOTE) SARS-CoV-2 target nucleic acids are NOT DETECTED.  The SARS-CoV-2 RNA is generally detectable in upper and lower respiratory specimens during the acute phase of infection. Negative results do not preclude  SARS-CoV-2 infection, do not rule out co-infections with other pathogens, and should not be used as the sole basis for treatment or other patient management decisions. Negative results must be combined with clinical observations, patient history, and epidemiological information. The expected result is Negative.  Fact Sheet for Patients: SugarRoll.be  Fact Sheet for Healthcare Providers: https://www.woods-mathews.com/  This test is not yet approved or cleared by the [place] FDA and  has been authorized for detection and/or diagnosis of SARS-CoV-2 by FDA under an Emergency Use Authorization (EUA). This EUA will remain  in effect (meaning this test can be used) for the duration of the COVID-19 declaration under Se ction 564(b)(1) of the Act, 21 U.S.C. section 360bbb-3(b)(1), unless the authorization is terminated or revoked sooner.  Performed at [facility], [place] [address] [place], [place] [address]      Time coordinating discharge:  35 minutes  SIGNED:   Barb Merino, MD  [facility] 02/24/2021, 12:07 PM

## 2021-02-23 NOTE — TOC Initial Note (Addendum)
Transition of Care [facility]) - Initial/Assessment Note    Patient Details  Name: [name] MRN: [id number] Date of Birth: 23-Dec-1933  Transition of Care [facility]) CM/SW Contact:    [name], [place] Phone Number: 02/23/2021, 12:07 PM  Clinical Narrative:                  CSW spoke to pt and daughter [name] at bedside. [name] stated that pt is a resident at [facility]. [name] stated that pt has been requiring more assistance and they have been working on getting him a bed at a long term care facility that provides more assistance. The family has already started the process for [facility] in [place]. [name] gave csw the number to [facility] at [phone].   CSW discussed pt/ot reccs with [name]. [name] stated that she would like to see if pt can do SNF at [place] before transitioning to Long term. CSW explained that his insurance will make that determination. CSW has started insurance auth.   2:40pm- CSW spoke to [facility] about pt. They can accept pt tomorrow (Saturday 3/19). [name] can be reached tomorrow at number [phone]. Please hard fax DC summary to [phone]. Pt will go to room 401B.  Pts insurance [name] is pending. CSW requested new covid test.  Expected Discharge Plan: Skilled Nursing Facility Barriers to Discharge: Continued Medical Work up   Patient Goals and CMS Choice Patient states their goals for this hospitalization and ongoing recovery are:: Long term Care CMS Medicare.gov Compare Post Acute Care list provided to:: Patient Choice offered to / list presented to : [place]  Expected Discharge Plan and Services Expected Discharge Plan: [place]       Living arrangements for the past 2 months: [place]                                      Prior Living Arrangements/Services Living arrangements for the past 2 months: [place] Lives with:: Facility Resident Patient language and need for  interpreter reviewed:: Yes Do you feel safe going back to the place where you live?: Yes      Need for Family Participation in Patient Care: Yes (Comment) Care giver support system in place?: Yes (comment)   Criminal Activity/Legal Involvement Pertinent to Current Situation/Hospitalization: No - Comment as needed  Activities of Daily Living      Permission Sought/Granted Permission sought to share information with : Family Chief Financial Officer Permission granted to share information with : Yes, Verbal Permission Granted     Permission granted to share info w AGENCY: SNF        Emotional Assessment Appearance:: Appears stated age Attitude/Demeanor/Rapport: Engaged Affect (typically observed): Appropriate Orientation: : Oriented to Self,Oriented to Place Alcohol / Substance Use: Not Applicable Psych Involvement: No (comment)  Admission diagnosis:  Rib fractures [S22.49XA] Vomiting in adult [R11.10] Multiple rib fractures [S22.49XA] Acute left flank pain [R10.9] Closed fracture of multiple ribs of left side, initial encounter [S22.42XA] Patient Active Problem List   Diagnosis Date Noted  . Rib fractures 02/22/2021  . 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 Stasis dermatitis of both legs 02/21/2021  . Multiple rib fractures 02/21/2021  . Bilateral edema of lower extremity 02/21/2021  . Vomiting in adult   . Ischemic stroke ([place]) 08/20/2020  . Thrombocytopenia ([place]) 08/20/2020  . Hypothyroidism  08/20/2020  . Dementia ([place]) 08/20/2020  . Depression 08/20/2020  . Cellulitis 08/20/2020  . Bilateral foot-drop 01/12/2020  . Diabetic neuropathy ([place]) 09/16/2017  . Gait abnormality 09/16/2017  . Memory difficulty 03/06/2017  . GIB (gastrointestinal bleeding) 03/31/2016  . Rectal bleeding   . Chronic kidney disease, stage IV (severe) ([place]) 03/23/2015  . Coronary artery disease 03/22/2014  . Essential  hypertension 03/22/2014  . Hyperlipidemia 03/22/2014  . Multinodular goiter 03/22/2014  . DM type 2, goal HbA1c < 7% (HCC) 03/22/2014  . Abdominal aortic aneurysm ([place]) 03/04/2012   PCP:  Hulan Fess, MD Pharmacy:   [place], [place] [address] Phone: [phone] Fax: [phone]     Social Determinants of Health (SDOH) Interventions    Readmission Risk Interventions No flowsheet data found.  [name], Latanya Presser, [place] Social Worker [phone]

## 2021-02-23 NOTE — NC FL2 (Signed)
[place] LEVEL OF CARE SCREENING TOOL     IDENTIFICATION  Patient Name: [name] Birthdate: 1934/05/06 Sex: male Admission Date (Current Location): 02/21/2021  [facility] and [place] Number:  Herbalist and Address:  [facility], [place] [address], [place], [place] [address]      Provider Number: [id number]  Attending Physician Name and Address:  Barb Merino, MD  Relative Name and Phone Number:       Current Level of Care: Hospital Recommended Level of Care: [place] Prior Approval Number:    Date Approved/Denied:   PASRR Number: [id number]  Discharge Plan: Home    Current Diagnoses: Patient Active Problem List   Diagnosis Date Noted  . Rib fractures 02/22/2021  . 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 Stasis dermatitis of both legs 02/21/2021  . Multiple rib fractures 02/21/2021  . Bilateral edema of lower extremity 02/21/2021  . Vomiting in adult   . Ischemic stroke ([place]) 08/20/2020  . Thrombocytopenia ([place]) 08/20/2020  . Hypothyroidism 08/20/2020  . Dementia ([place]) 08/20/2020  . Depression 08/20/2020  . Cellulitis 08/20/2020  . Bilateral foot-drop 01/12/2020  . Diabetic neuropathy ([place]) 09/16/2017  . Gait abnormality 09/16/2017  . Memory difficulty 03/06/2017  . GIB (gastrointestinal bleeding) 03/31/2016  . Rectal bleeding   . Chronic kidney disease, stage IV (severe) ([place]) 03/23/2015  . Coronary artery disease 03/22/2014  . Essential hypertension 03/22/2014  . Hyperlipidemia 03/22/2014  . Multinodular goiter 03/22/2014  . DM type 2, goal HbA1c < 7% (HCC) 03/22/2014  . Abdominal aortic aneurysm ([place]) 03/04/2012    Orientation RESPIRATION BLADDER Height & Weight     Self,Place  Normal Incontinent Weight: 197 lb (89.4 kg) Height:  5\' 10"  (177.8 cm)  BEHAVIORAL SYMPTOMS/MOOD NEUROLOGICAL BOWEL NUTRITION STATUS      Incontinent Diet   AMBULATORY STATUS COMMUNICATION OF NEEDS Skin   Extensive Assist Verbally Normal                       Personal Care Assistance Level of Assistance  Bathing,Feeding,Dressing Bathing Assistance: Maximum assistance Feeding assistance: Independent Dressing Assistance: Maximum assistance     Functional Limitations Info  Hearing,Sight,Speech Sight Info: Adequate Hearing Info: Adequate Speech Info: Adequate    SPECIAL CARE FACTORS FREQUENCY  PT (By licensed PT),OT (By licensed OT)     PT Frequency: 5x a week OT Frequency: 5x a week            Contractures Contractures Info: Not present    Additional Factors Info  Code Status,Allergies Code Status Info: Full Allergies Info: Nsaids           Current Medications (02/23/2021):  This is the current hospital active medication list Current Facility-Administered Medications  Medication Dose Route Frequency Provider Last Rate Last Admin  . 0.9 %  sodium chloride infusion  250 mL Intravenous PRN Roney Jaffe, MD      . acetaminophen (TYLENOL) tablet 650 mg  650 mg Oral Q6H Jill Alexanders, PA-C   650 mg at 02/23/21 1117  . atorvastatin (LIPITOR) tablet 40 mg  40 mg Oral Daily Roney Jaffe, MD   40 mg at 02/23/21 1118  . calcitRIOL (ROCALTROL) capsule 0.25 mcg  0.25 mcg Oral Daily Roney Jaffe, MD   0.25 mcg at 02/23/21 1118  . clopidogrel (PLAVIX) tablet 75 mg  75 mg Oral Daily Barb Merino, MD   75  mg at 02/23/21 1117  . donepezil (ARICEPT) tablet 10 mg  10 mg Oral Daily Roney Jaffe, MD   10 mg at 02/23/21 1117  . DULoxetine (CYMBALTA) DR capsule 30 mg  30 mg Oral Daily Roney Jaffe, MD   30 mg at 02/23/21 1118  . enoxaparin (LOVENOX) injection 30 mg  30 mg Subcutaneous Q24H Roney Jaffe, MD   30 mg at 02/22/21 2133  . fentaNYL (SUBLIMAZE) injection 25 mcg  25 mcg Intravenous Q2H PRN Elnora Morrison, MD   25 mcg at 02/22/21 0901  . furosemide (LASIX) injection 40 mg  40 mg Intravenous Q12H Roney Jaffe, MD   40 mg at 02/23/21 0927  . gabapentin (NEURONTIN) capsule 100 mg  100 mg Oral BID Roney Jaffe, MD   100 mg at 02/23/21 1117  . insulin aspart (novoLOG) injection 0-15 Units  0-15 Units Subcutaneous TID WC Roney Jaffe, MD   3 Units at 02/23/21 [phone]  . insulin aspart (novoLOG) injection 0-5 Units  0-5 Units Subcutaneous QHS Roney Jaffe, MD   3 Units at 02/22/21 2213  . insulin glargine (LANTUS) injection 20 Units  20 Units Subcutaneous QHS Roney Jaffe, MD   20 Units at 02/22/21 2213  . levothyroxine (SYNTHROID) tablet 75 mcg  75 mcg Oral QAC breakfast Roney Jaffe, MD   75 mcg at 02/23/21 0536  . lidocaine (LIDODERM) 5 % 1 patch  1 patch Transdermal Q24H Jill Alexanders, PA-C   1 patch at 02/22/21 1615  . methocarbamol (ROBAXIN) tablet 500 mg  500 mg Oral TID Jill Alexanders, PA-C   500 mg at 02/23/21 1117  . metoCLOPramide (REGLAN) tablet 10 mg  10 mg Oral Q6H PRN Roney Jaffe, MD      . metoprolol succinate (TOPROL-XL) 24 hr tablet 25 mg  25 mg Oral Daily Roney Jaffe, MD   25 mg at 02/23/21 1135  . multivitamin with minerals tablet 1 tablet  1 tablet Oral Daily Roney Jaffe, MD   1 tablet at 02/23/21 1118  . niacin (NIASPAN) CR tablet 1,000 mg  1,000 mg Oral BID Roney Jaffe, MD   1,000 mg at 02/23/21 1118  . ondansetron (ZOFRAN) tablet 4 mg  4 mg Oral Q6H PRN Roney Jaffe, MD       Or  . ondansetron [facility]) injection 4 mg  4 mg Intravenous Q6H PRN Roney Jaffe, MD   4 mg at 02/22/21 1622  . polyethylene glycol (MIRALAX / GLYCOLAX) packet 17 g  17 g Oral Daily PRN Roney Jaffe, MD      . saccharomyces boulardii (FLORASTOR) capsule 250 mg  250 mg Oral BID Roney Jaffe, MD   250 mg at 02/23/21 1117  . sertraline (ZOLOFT) tablet 50 mg  50 mg Oral Daily Roney Jaffe, MD   50 mg at 02/23/21 1117  . sodium chloride flush (NS) 0.9 % injection 3 mL  3 mL Intravenous Q12H Roney Jaffe, MD   3 mL at 02/23/21 1142  . sodium chloride flush  (NS) 0.9 % injection 3 mL  3 mL Intravenous PRN Roney Jaffe, MD      . traMADol Veatrice [place]) tablet 50-100 mg  50-100 mg Oral Q6H PRN Jill Alexanders, PA-C   50 mg at 02/23/21 0244  . triamcinolone 0.1 % cream : eucerin cream, 1:1   Topical BID Roney Jaffe, MD   Given at 02/22/21 2132     Discharge Medications: Please see discharge summary for a list of discharge medications.  Relevant Imaging  Results:  Relevant Lab Results:   Additional Information SS# [id number] [place], LCSWA

## 2021-02-24 DIAGNOSIS — E1140 Type 2 diabetes mellitus with diabetic neuropathy, unspecified: Secondary | ICD-10-CM | POA: Diagnosis not present

## 2021-02-24 DIAGNOSIS — M21371 Foot drop, right foot: Secondary | ICD-10-CM | POA: Diagnosis not present

## 2021-02-24 DIAGNOSIS — Z743 Need for continuous supervision: Secondary | ICD-10-CM | POA: Diagnosis not present

## 2021-02-24 DIAGNOSIS — I872 Venous insufficiency (chronic) (peripheral): Secondary | ICD-10-CM | POA: Diagnosis not present

## 2021-02-24 DIAGNOSIS — E039 Hypothyroidism, unspecified: Secondary | ICD-10-CM | POA: Diagnosis not present

## 2021-02-24 DIAGNOSIS — E1149 Type 2 diabetes mellitus with other diabetic neurological complication: Secondary | ICD-10-CM | POA: Diagnosis not present

## 2021-02-24 DIAGNOSIS — I2510 Atherosclerotic heart disease of native coronary artery without angina pectoris: Secondary | ICD-10-CM | POA: Diagnosis not present

## 2021-02-24 DIAGNOSIS — I6389 Other cerebral infarction: Secondary | ICD-10-CM | POA: Diagnosis not present

## 2021-02-24 DIAGNOSIS — E1122 Type 2 diabetes mellitus with diabetic chronic kidney disease: Secondary | ICD-10-CM | POA: Diagnosis not present

## 2021-02-24 DIAGNOSIS — S2242XA Multiple fractures of ribs, left side, initial encounter for closed fracture: Secondary | ICD-10-CM | POA: Diagnosis not present

## 2021-02-24 DIAGNOSIS — S2242XD Multiple fractures of ribs, left side, subsequent encounter for fracture with routine healing: Secondary | ICD-10-CM | POA: Diagnosis not present

## 2021-02-24 DIAGNOSIS — I10 Essential (primary) hypertension: Secondary | ICD-10-CM | POA: Diagnosis not present

## 2021-02-24 DIAGNOSIS — E0500 Thyrotoxicosis with diffuse goiter without thyrotoxic crisis or storm: Secondary | ICD-10-CM | POA: Diagnosis not present

## 2021-02-24 DIAGNOSIS — E1165 Type 2 diabetes mellitus with hyperglycemia: Secondary | ICD-10-CM | POA: Diagnosis not present

## 2021-02-24 DIAGNOSIS — F0390 Unspecified dementia without behavioral disturbance: Secondary | ICD-10-CM | POA: Diagnosis not present

## 2021-02-24 DIAGNOSIS — I69354 Hemiplegia and hemiparesis following cerebral infarction affecting left non-dominant side: Secondary | ICD-10-CM | POA: Diagnosis not present

## 2021-02-24 DIAGNOSIS — E559 Vitamin D deficiency, unspecified: Secondary | ICD-10-CM | POA: Diagnosis not present

## 2021-02-24 DIAGNOSIS — N184 Chronic kidney disease, stage 4 (severe): Secondary | ICD-10-CM | POA: Diagnosis not present

## 2021-02-24 DIAGNOSIS — R600 Localized edema: Secondary | ICD-10-CM | POA: Diagnosis not present

## 2021-02-24 DIAGNOSIS — D649 Anemia, unspecified: Secondary | ICD-10-CM | POA: Diagnosis not present

## 2021-02-24 DIAGNOSIS — R279 Unspecified lack of coordination: Secondary | ICD-10-CM | POA: Diagnosis not present

## 2021-02-24 DIAGNOSIS — E119 Type 2 diabetes mellitus without complications: Secondary | ICD-10-CM | POA: Diagnosis not present

## 2021-02-24 DIAGNOSIS — R4189 Other symptoms and signs involving cognitive functions and awareness: Secondary | ICD-10-CM | POA: Diagnosis not present

## 2021-02-24 DIAGNOSIS — R5381 Other malaise: Secondary | ICD-10-CM | POA: Diagnosis not present

## 2021-02-24 DIAGNOSIS — R1111 Vomiting without nausea: Secondary | ICD-10-CM | POA: Diagnosis not present

## 2021-02-24 DIAGNOSIS — Z794 Long term (current) use of insulin: Secondary | ICD-10-CM | POA: Diagnosis not present

## 2021-02-24 DIAGNOSIS — I6930 Unspecified sequelae of cerebral infarction: Secondary | ICD-10-CM | POA: Diagnosis not present

## 2021-02-24 DIAGNOSIS — I739 Peripheral vascular disease, unspecified: Secondary | ICD-10-CM | POA: Diagnosis not present

## 2021-02-24 DIAGNOSIS — M21372 Foot drop, left foot: Secondary | ICD-10-CM | POA: Diagnosis not present

## 2021-02-24 DIAGNOSIS — R112 Nausea with vomiting, unspecified: Secondary | ICD-10-CM | POA: Diagnosis not present

## 2021-02-24 DIAGNOSIS — R110 Nausea: Secondary | ICD-10-CM | POA: Diagnosis not present

## 2021-02-24 DIAGNOSIS — R296 Repeated falls: Secondary | ICD-10-CM | POA: Diagnosis not present

## 2021-02-24 DIAGNOSIS — G6289 Other specified polyneuropathies: Secondary | ICD-10-CM | POA: Diagnosis not present

## 2021-02-24 LAB — BASIC METABOLIC PANEL
Anion gap: 7 (ref 5–15)
BUN: 43 mg/dL — ABNORMAL HIGH (ref 8–23)
CO2: 29 mmol/L (ref 22–32)
Calcium: 8.8 mg/dL — ABNORMAL LOW (ref 8.9–10.3)
Chloride: 99 mmol/L (ref 98–111)
Creatinine, Ser: 3.02 mg/dL — ABNORMAL HIGH (ref 0.61–1.24)
GFR, Estimated: 20 mL/min — ABNORMAL LOW (ref >60)
Glucose, Bld: 304 mg/dL — ABNORMAL HIGH (ref 70–99)
Potassium: 3.5 mmol/L (ref 3.5–5.1)
Sodium: 135 mmol/L (ref 135–145)

## 2021-02-24 LAB — GLUCOSE, CAPILLARY
Glucose-Capillary: 202 mg/dL — ABNORMAL HIGH (ref 70–99)
Glucose-Capillary: 194 mg/dL — ABNORMAL HIGH (ref 70–99)

## 2021-02-24 LAB — SARS CORONAVIRUS 2 (TAT 6-24 HRS): SARS Coronavirus 2: NEGATIVE

## 2021-02-24 MED ORDER — METOPROLOL SUCCINATE ER 25 MG PO TB24: 25.0000 mg | ORAL_TABLET | Freq: Every day | ORAL | 0 refills | Status: AC

## 2021-02-24 NOTE — TOC Transition Note (Signed)
Transition of Care [facility]) - CM/SW Discharge Note   Patient Details  Name: [name] MRN: [id number] Date of Birth: May 24, 1934  [facility]) CM/SW Contact:  Elliot Gurney [place], [place] Phone Number: [phone] 02/24/2021, 10:20 AM   Clinical Narrative:    Authorization from [facility] [employer] received. Patient to discharge to [facility] today. Patient to be transported by [facility]. Patient will be going to room 401B. Report to be called in to [phone], please ask for the [place]. Discharge summary faxed to [phone].  Transition of Care to continue to follow.   [place], LCSW Transition of Care [phone]      Barriers to Discharge: Continued Medical Work up   Patient Goals and CMS Choice Patient states their goals for this hospitalization and ongoing recovery are:: Long term Care CMS Medicare.gov Compare Post Acute Care list provided to:: Patient Choice offered to / list presented to : [place]  Discharge Placement                       Discharge Plan and Services                                     Social Determinants of Health (SDOH) Interventions     Readmission Risk Interventions No flowsheet data found.

## 2021-02-24 NOTE — Progress Notes (Signed)
[place] [facility] to give report to [place], Therapist, sports for [address].

## 2021-02-24 NOTE — Progress Notes (Signed)
PROGRESS NOTE    [name]  BWG:[id number] DOB: 01-May-1934 DOA: 02/21/2021 PCP: Hulan Fess, MD    Brief Narrative:   87 year old gentleman with history of CVA with left hemiparesis, dementia, hypertension, chronic left foot drop, type 2 diabetes on insulin, coronary artery disease, CKD4 with baseline creatinine about 3, history of CABG presented to the emergency room from assisted living facility for flank pain and low back pain, 3 subsequent fall while taking shower and helped by someone. On arrival to the emergency room, hemodynamically stable. Leg swelling present. Skeletal survey consistent with multiple fracture on the left side with no complication.  Assessment & Plan:   Principal Problem:   Multiple rib fractures Active Problems:   Essential hypertension   DM type 2, goal HbA1c < 7% (HCC)   Chronic kidney disease, stage IV (severe) (HCC)   Hypothyroidism   Dementia (HCC)   Depression   History of repair of aneurysm of abdominal aorta using endovascular stent graft   Stasis dermatitis of both legs   Bilateral edema of lower extremity   Rib fractures  Trauma/multiple rib fractures: Uncomplicated. Still with ongoing pain and nausea. Trauma surgery recommended conservative management.  Occasionally used IV pain medicine, however currently well maintained on oral tramadol, lidocaine patches.  Is participating in chest physiotherapy as much possible.    History of stroke with left hemiparesis: Recent frequent fall.  Reportedly more weaker than usual.  MRI of the brain was done that did not show any new stroke.  Patient is on Plavix that is continued.  CKD stage IV: Baseline creatinine about 3.  He does have well-established follow-up with [facility].  Bilateral lower extremity edema: Multifactorial edema. Trial of Lasix IV with some improvement.  Bilateral compression stockings will help. Swelling mostly improved.  Hypertension: Blood pressure stable.  Type  2 diabetes: Insulin regimen from home.   Coronary artery disease with history of CABG: Stable. No evidence of new coronary syndrome.  Advance care planning: Discussed with patient and patient's daughter at the bedside on 3/18, introduced MOST form, different level of resuscitation and CPR in the state of [place].  She took the form home and discussed with her mother, they filled out the form with DNR and comfort care options. Golden yellow DNR form signed. MOST form signed and dated and sent to the patient.    DVT prophylaxis: enoxaparin (LOVENOX) injection 30 mg Start: 02/21/21 2200   Code Status: DNR Family Communication: Son at the bedside Disposition Plan: Status is: Inpatient  Discharging today  Dispo: The patient is from: ALF              Anticipated d/c is to: SNF              Patient currently is medically stable to d/c.   Difficult to place patient No         Consultants:   Trauma surgery  Procedures:   None  Antimicrobials:   None   Subjective: Patient seen and examined.  No overnight events.  Some pain but controlled.  He is looking forward to go to [facility].  While [facility] put him on stretcher, he had some nausea and was given some nausea medicine.  Does have chronic nausea and uses metoclopramide as needed.  Objective: Vitals:   02/23/21 1355 02/23/21 2058 02/24/21 0442 02/24/21 1010  BP: (!) 145/59 123/61 (!) 133/58 (!) 139/52  Pulse: (!) 57 (!) 55 (!) 52 (!) 53  Resp: 16 17  16 18  Temp: 97.9 F (36.6 C) 98.2 F (36.8 C) (!) 97.4 F (36.3 C) 97.9 F (36.6 C)  TempSrc:  Oral Oral Oral  SpO2: 100% 99% 100% 98%  Weight:      Height:        Intake/Output Summary (Last 24 hours) at 02/24/2021 1414 Last data filed at 02/24/2021 0750 Gross per 24 hour  Intake 640 ml  Output 800 ml  Net -160 ml   Filed Weights   02/22/21 0859  Weight: 89.4 kg    Examination:  Looks comfortable.  On room air. He has chronic weakness on  the left side. Patient has some ecchymosis on the left lower chest wall.  No palpable tenderness or deformity.    Data Reviewed: I have personally reviewed following labs and imaging studies  CBC: Recent Labs  Lab 02/21/21 1312 02/22/21 0530  WBC 6.6 5.7  HGB 13.7 12.5*  HCT 41.7 37.5*  MCV 90.1 90.4  PLT 101* 956*   Basic Metabolic Panel: Recent Labs  Lab 02/21/21 1312 02/22/21 0530 02/23/21 0055 02/24/21 0002  NA 137 138 139 135  K 3.7 4.1 3.5 3.5  CL 104 105 102 99  CO2 24 24 30 29   GLUCOSE 310* 225* 257* 304*  BUN 36* 41* 41* 43*  CREATININE 3.03* 3.27* 3.19* 3.02*  CALCIUM 9.5 9.1 9.2 8.8*   GFR: Estimated Creatinine Clearance: 20.5 mL/min (A) (by C-G formula based on SCr of 3.02 mg/dL (H)). Liver Function Tests: Recent Labs  Lab 02/21/21 1312  AST 28  ALT 33  ALKPHOS 63  BILITOT 0.8  PROT 5.5*  ALBUMIN 2.9*   Recent Labs  Lab 02/21/21 1312  LIPASE 77*   No results for input(s): AMMONIA in the last 168 hours. Coagulation Profile: No results for input(s): INR, PROTIME in the last 168 hours. Cardiac Enzymes: No results for input(s): CKTOTAL, CKMB, CKMBINDEX, TROPONINI in the last 168 hours. BNP (last 3 results) No results for input(s): PROBNP in the last 8760 hours. HbA1C: No results for input(s): HGBA1C in the last 72 hours. CBG: Recent Labs  Lab 02/23/21 1655 02/23/21 2019 02/23/21 2102 02/24/21 0757 02/24/21 1154  GLUCAP 273* 345* 378* 202* 194*   Lipid Profile: No results for input(s): CHOL, HDL, LDLCALC, TRIG, CHOLHDL, LDLDIRECT in the last 72 hours. Thyroid Function Tests: No results for input(s): TSH, T4TOTAL, FREET4, T3FREE, THYROIDAB in the last 72 hours. Anemia Panel: No results for input(s): VITAMINB12, FOLATE, FERRITIN, TIBC, IRON, RETICCTPCT in the last 72 hours. Sepsis Labs: No results for input(s): PROCALCITON, LATICACIDVEN in the last 168 hours.  Recent Results (from the past 240 hour(s))  Resp Panel by RT-PCR (Flu  A&B, Covid) Nasopharyngeal Swab     Status: None   Collection Time: 02/21/21  5:11 PM   Specimen: Nasopharyngeal Swab; Nasopharyngeal(NP) swabs in vial transport medium  Result Value Ref Range Status   SARS Coronavirus 2 by RT PCR NEGATIVE NEGATIVE Final    Comment: (NOTE) SARS-CoV-2 target nucleic acids are NOT DETECTED.  The SARS-CoV-2 RNA is generally detectable in upper respiratory specimens during the acute phase of infection. The lowest concentration of SARS-CoV-2 viral copies this assay can detect is 138 copies/mL. A negative result does not preclude SARS-Cov-2 infection and should not be used as the sole basis for treatment or other patient management decisions. A negative result may occur with  improper specimen collection/handling, submission of specimen other than nasopharyngeal swab, presence of viral mutation(s) within the areas targeted by this  assay, and inadequate number of viral copies(<138 copies/mL). A negative result must be combined with clinical observations, patient history, and epidemiological information. The expected result is Negative.  Fact Sheet for Patients:  EntrepreneurPulse.com.au  Fact Sheet for Healthcare Providers:  IncredibleEmployment.be  This test is no t yet approved or cleared by the [place] FDA and  has been authorized for detection and/or diagnosis of SARS-CoV-2 by FDA under an Emergency Use Authorization (EUA). This EUA will remain  in effect (meaning this test can be used) for the duration of the COVID-19 declaration under Section 564(b)(1) of the Act, 21 U.S.C.section 360bbb-3(b)(1), unless the authorization is terminated  or revoked sooner.       Influenza A by PCR NEGATIVE NEGATIVE Final   Influenza B by PCR NEGATIVE NEGATIVE Final    Comment: (NOTE) The Xpert Xpress SARS-CoV-2/FLU/RSV plus assay is intended as an aid in the diagnosis of influenza from Nasopharyngeal swab specimens  and should not be used as a sole basis for treatment. Nasal washings and aspirates are unacceptable for Xpert Xpress SARS-CoV-2/FLU/RSV testing.  Fact Sheet for Patients: EntrepreneurPulse.com.au  Fact Sheet for Healthcare Providers: IncredibleEmployment.be  This test is not yet approved or cleared by the [place] FDA and has been authorized for detection and/or diagnosis of SARS-CoV-2 by FDA under an Emergency Use Authorization (EUA). This EUA will remain in effect (meaning this test can be used) for the duration of the COVID-19 declaration under Section 564(b)(1) of the Act, 21 U.S.C. section 360bbb-3(b)(1), unless the authorization is terminated or revoked.  Performed at [facility], [place] [address] [place], [place] [address]   SARS CORONAVIRUS 2 (TAT 6-24 HRS) Nasopharyngeal Nasopharyngeal Swab     Status: None   Collection Time: 02/23/21  2:50 PM   Specimen: Nasopharyngeal Swab  Result Value Ref Range Status   SARS Coronavirus 2 NEGATIVE NEGATIVE Final    Comment: (NOTE) SARS-CoV-2 target nucleic acids are NOT DETECTED.  The SARS-CoV-2 RNA is generally detectable in upper and lower respiratory specimens during the acute phase of infection. Negative results do not preclude SARS-CoV-2 infection, do not rule out co-infections with other pathogens, and should not be used as the sole basis for treatment or other patient management decisions. Negative results must be combined with clinical observations, patient history, and epidemiological information. The expected result is Negative.  Fact Sheet for Patients: SugarRoll.be  Fact Sheet for Healthcare Providers: https://www.woods-mathews.com/  This test is not yet approved or cleared by the [place] FDA and  has been authorized for detection and/or diagnosis of SARS-CoV-2 by FDA under an Emergency Use Authorization (EUA). This EUA  will remain  in effect (meaning this test can be used) for the duration of the COVID-19 declaration under Se ction 564(b)(1) of the Act, 21 U.S.C. section 360bbb-3(b)(1), unless the authorization is terminated or revoked sooner.  Performed at [facility], [place] [address], [place], [place] [address]          Radiology Studies: MR BRAIN WO CONTRAST  Result Date: 02/22/2021 CLINICAL DATA:  Stroke suspected. EXAM: MRI HEAD WITHOUT CONTRAST TECHNIQUE: Multiplanar, multiecho pulse sequences of the brain and surrounding structures were obtained without intravenous contrast. COMPARISON:  CT head February 21, 2021.  MRI August 20, 2020. FINDINGS: Brain: No acute infarct. Remote lacunar infarcts involving the right basal ganglia and right thalamocapsular region. Mild to moderate for age scattered additional T2/FLAIR hyperintensities, most likely related to chronic microvascular ischemic disease. Focus of susceptibility artifact in the  right cerebellum and left occipital lobe, likely related to prior hemorrhage. Vascular: Major arterial flow voids are maintained at the skull base. 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 Skull and upper cervical spine: Normal marrow signal. Sinuses/Orbits: Small left maxillary sinus retention cyst. Otherwise, sinuses are largely clear. Unremarkable orbits. Other: Small left and trace right mastoid effusions. IMPRESSION: 1. No evidence of acute intracranial abnormality.  No acute infarct. 2. Similar chronic findings, as detailed above. Electronically Signed   By: Margaretha Sheffield MD   On: 02/22/2021 17:37        Scheduled Meds: . acetaminophen  650 mg Oral Q6H  . atorvastatin  40 mg Oral Daily  . calcitRIOL  0.25 mcg Oral Daily  . clopidogrel  75 mg Oral Daily  . donepezil  10 mg Oral Daily  . DULoxetine  30 mg Oral Daily  . enoxaparin (LOVENOX) injection  30 mg Subcutaneous Q24H  . furosemide  40 mg Oral Daily  . gabapentin  100 mg Oral  BID  . insulin aspart  0-15 Units Subcutaneous TID WC  . insulin aspart  0-5 Units Subcutaneous QHS  . insulin glargine  25 Units Subcutaneous QHS  . levothyroxine  75 mcg Oral QAC breakfast  . lidocaine  1 patch Transdermal Q24H  . methocarbamol  500 mg Oral TID  . metoprolol succinate  25 mg Oral Daily  . multivitamin with minerals  1 tablet Oral Daily  . niacin  1,000 mg Oral BID  . saccharomyces boulardii  250 mg Oral BID  . sertraline  50 mg Oral Daily  . sodium chloride flush  3 mL Intravenous Q12H  . triamcinolone 0.1 % cream : eucerin   Topical BID   Continuous Infusions: . sodium chloride       LOS: 2 days    Time spent: 25 minutes    Barb Merino, MD [facility] Pager [phone]

## 2021-02-24 NOTE — Progress Notes (Signed)
[name] to be D/C'd  per MD order. Discussed with the patient and son and all questions fully answered.  VSS, Skin clean, dry and intact without evidence of skin break down, no evidence of skin tears noted.  IV catheter discontinued intact. Site without signs and symptoms of complications. Dressing and pressure applied.  An After Visit Summary was printed and given to [facility].  Pt vomited in hallway on way out. [facility] brought pt back to room for nausea meds before transport. Notified MD and he is going to send more nausea-related med information to the facility.  Patient to be escorted via [facility] to [facility]

## 2021-02-24 NOTE — Progress Notes (Signed)
Pt vomited on [facility] stretcher in the hallway. [facility] brought the stretcher back to the room and I gave him zofran and notified Dr. Sloan Leiter. Dr. Sloan Leiter was going to send more nausea-related orders to the SNF.  Pt calm and wave of nausea/vomiting passed for transport at 1400.

## 2021-02-26 DIAGNOSIS — I69354 Hemiplegia and hemiparesis following cerebral infarction affecting left non-dominant side: Secondary | ICD-10-CM | POA: Diagnosis not present

## 2021-02-26 DIAGNOSIS — I2510 Atherosclerotic heart disease of native coronary artery without angina pectoris: Secondary | ICD-10-CM | POA: Diagnosis not present

## 2021-02-26 DIAGNOSIS — R5381 Other malaise: Secondary | ICD-10-CM | POA: Diagnosis not present

## 2021-02-26 DIAGNOSIS — M21371 Foot drop, right foot: Secondary | ICD-10-CM | POA: Diagnosis not present

## 2021-02-26 DIAGNOSIS — I6389 Other cerebral infarction: Secondary | ICD-10-CM | POA: Diagnosis not present

## 2021-02-26 DIAGNOSIS — G6289 Other specified polyneuropathies: Secondary | ICD-10-CM | POA: Diagnosis not present

## 2021-02-26 DIAGNOSIS — E1149 Type 2 diabetes mellitus with other diabetic neurological complication: Secondary | ICD-10-CM | POA: Diagnosis not present

## 2021-02-26 DIAGNOSIS — R4189 Other symptoms and signs involving cognitive functions and awareness: Secondary | ICD-10-CM | POA: Diagnosis not present

## 2021-02-26 DIAGNOSIS — S2242XA Multiple fractures of ribs, left side, initial encounter for closed fracture: Secondary | ICD-10-CM | POA: Diagnosis not present

## 2021-02-26 DIAGNOSIS — I10 Essential (primary) hypertension: Secondary | ICD-10-CM | POA: Diagnosis not present

## 2021-02-26 DIAGNOSIS — M21372 Foot drop, left foot: Secondary | ICD-10-CM | POA: Diagnosis not present

## 2021-02-26 DIAGNOSIS — E0500 Thyrotoxicosis with diffuse goiter without thyrotoxic crisis or storm: Secondary | ICD-10-CM | POA: Diagnosis not present

## 2021-02-28 DIAGNOSIS — I10 Essential (primary) hypertension: Secondary | ICD-10-CM | POA: Diagnosis not present

## 2021-02-28 DIAGNOSIS — N184 Chronic kidney disease, stage 4 (severe): Secondary | ICD-10-CM | POA: Diagnosis not present

## 2021-03-07 DIAGNOSIS — N184 Chronic kidney disease, stage 4 (severe): Secondary | ICD-10-CM | POA: Diagnosis not present

## 2021-03-07 DIAGNOSIS — D649 Anemia, unspecified: Secondary | ICD-10-CM | POA: Diagnosis not present

## 2021-03-07 DIAGNOSIS — E1165 Type 2 diabetes mellitus with hyperglycemia: Secondary | ICD-10-CM | POA: Diagnosis not present

## 2021-03-07 DIAGNOSIS — I69354 Hemiplegia and hemiparesis following cerebral infarction affecting left non-dominant side: Secondary | ICD-10-CM | POA: Diagnosis not present

## 2021-03-12 DIAGNOSIS — N184 Chronic kidney disease, stage 4 (severe): Secondary | ICD-10-CM | POA: Diagnosis not present

## 2021-03-12 DIAGNOSIS — I10 Essential (primary) hypertension: Secondary | ICD-10-CM | POA: Diagnosis not present

## 2021-03-12 DIAGNOSIS — I69354 Hemiplegia and hemiparesis following cerebral infarction affecting left non-dominant side: Secondary | ICD-10-CM | POA: Diagnosis not present

## 2021-03-19 DIAGNOSIS — E1165 Type 2 diabetes mellitus with hyperglycemia: Secondary | ICD-10-CM | POA: Diagnosis not present

## 2021-03-19 DIAGNOSIS — I6389 Other cerebral infarction: Secondary | ICD-10-CM | POA: Diagnosis not present

## 2021-03-19 DIAGNOSIS — E7849 Other hyperlipidemia: Secondary | ICD-10-CM | POA: Diagnosis not present

## 2021-03-19 DIAGNOSIS — N184 Chronic kidney disease, stage 4 (severe): Secondary | ICD-10-CM | POA: Diagnosis not present

## 2021-03-19 DIAGNOSIS — F3289 Other specified depressive episodes: Secondary | ICD-10-CM | POA: Diagnosis not present

## 2021-03-19 DIAGNOSIS — E038 Other specified hypothyroidism: Secondary | ICD-10-CM | POA: Diagnosis not present

## 2021-03-19 DIAGNOSIS — I10 Essential (primary) hypertension: Secondary | ICD-10-CM | POA: Diagnosis not present

## 2021-03-20 DIAGNOSIS — M545 Low back pain, unspecified: Secondary | ICD-10-CM | POA: Diagnosis not present

## 2021-03-27 ENCOUNTER — Ambulatory Visit: Payer: HMO | Admitting: Podiatry

## 2021-03-27 DIAGNOSIS — E1140 Type 2 diabetes mellitus with diabetic neuropathy, unspecified: Secondary | ICD-10-CM | POA: Diagnosis not present

## 2021-04-02 DIAGNOSIS — N184 Chronic kidney disease, stage 4 (severe): Secondary | ICD-10-CM | POA: Diagnosis not present

## 2021-04-02 DIAGNOSIS — E1165 Type 2 diabetes mellitus with hyperglycemia: Secondary | ICD-10-CM | POA: Diagnosis not present

## 2021-04-13 DIAGNOSIS — N390 Urinary tract infection, site not specified: Secondary | ICD-10-CM | POA: Diagnosis not present

## 2021-04-16 DIAGNOSIS — E1149 Type 2 diabetes mellitus with other diabetic neurological complication: Secondary | ICD-10-CM | POA: Diagnosis not present

## 2021-04-16 DIAGNOSIS — N390 Urinary tract infection, site not specified: Secondary | ICD-10-CM | POA: Diagnosis not present

## 2021-04-23 DIAGNOSIS — N390 Urinary tract infection, site not specified: Secondary | ICD-10-CM | POA: Diagnosis not present

## 2021-04-23 DIAGNOSIS — N184 Chronic kidney disease, stage 4 (severe): Secondary | ICD-10-CM | POA: Diagnosis not present

## 2021-04-27 DIAGNOSIS — B029 Zoster without complications: Secondary | ICD-10-CM | POA: Diagnosis not present

## 2021-04-27 DIAGNOSIS — I69354 Hemiplegia and hemiparesis following cerebral infarction affecting left non-dominant side: Secondary | ICD-10-CM | POA: Diagnosis not present

## 2021-04-30 DIAGNOSIS — I69354 Hemiplegia and hemiparesis following cerebral infarction affecting left non-dominant side: Secondary | ICD-10-CM | POA: Diagnosis not present

## 2021-04-30 DIAGNOSIS — B029 Zoster without complications: Secondary | ICD-10-CM | POA: Diagnosis not present

## 2021-05-04 DIAGNOSIS — F32A Depression, unspecified: Secondary | ICD-10-CM | POA: Diagnosis not present

## 2021-05-04 DIAGNOSIS — N184 Chronic kidney disease, stage 4 (severe): Secondary | ICD-10-CM | POA: Diagnosis not present

## 2021-05-04 DIAGNOSIS — I69354 Hemiplegia and hemiparesis following cerebral infarction affecting left non-dominant side: Secondary | ICD-10-CM | POA: Diagnosis not present

## 2021-05-04 DIAGNOSIS — I6389 Other cerebral infarction: Secondary | ICD-10-CM | POA: Diagnosis not present

## 2021-05-04 DIAGNOSIS — R4189 Other symptoms and signs involving cognitive functions and awareness: Secondary | ICD-10-CM | POA: Diagnosis not present

## 2021-05-04 DIAGNOSIS — B029 Zoster without complications: Secondary | ICD-10-CM | POA: Diagnosis not present

## 2021-05-04 DIAGNOSIS — D6489 Other specified anemias: Secondary | ICD-10-CM | POA: Diagnosis not present

## 2021-05-04 DIAGNOSIS — S2242XA Multiple fractures of ribs, left side, initial encounter for closed fracture: Secondary | ICD-10-CM | POA: Diagnosis not present

## 2021-05-04 DIAGNOSIS — E1149 Type 2 diabetes mellitus with other diabetic neurological complication: Secondary | ICD-10-CM | POA: Diagnosis not present

## 2021-05-04 DIAGNOSIS — R5381 Other malaise: Secondary | ICD-10-CM | POA: Diagnosis not present

## 2021-05-04 DIAGNOSIS — G6289 Other specified polyneuropathies: Secondary | ICD-10-CM | POA: Diagnosis not present

## 2021-05-04 DIAGNOSIS — I10 Essential (primary) hypertension: Secondary | ICD-10-CM | POA: Diagnosis not present

## 2021-05-09 DIAGNOSIS — N390 Urinary tract infection, site not specified: Secondary | ICD-10-CM | POA: Diagnosis not present

## 2021-05-11 DIAGNOSIS — I6389 Other cerebral infarction: Secondary | ICD-10-CM | POA: Diagnosis not present

## 2021-05-11 DIAGNOSIS — N184 Chronic kidney disease, stage 4 (severe): Secondary | ICD-10-CM | POA: Diagnosis not present

## 2021-05-11 DIAGNOSIS — I10 Essential (primary) hypertension: Secondary | ICD-10-CM | POA: Diagnosis not present

## 2021-05-11 DIAGNOSIS — B029 Zoster without complications: Secondary | ICD-10-CM | POA: Diagnosis not present

## 2021-05-15 ENCOUNTER — Other Ambulatory Visit: Payer: Self-pay

## 2021-05-15 ENCOUNTER — Emergency Department (HOSPITAL_COMMUNITY)
Admission: EM | Admit: 2021-05-15 | Discharge: 2021-05-16 | Disposition: A | Discharge status: Home / Self Care | Payer: HMO | Attending: Emergency Medicine | Admitting: Emergency Medicine

## 2021-05-15 ENCOUNTER — Ambulatory Visit (INDEPENDENT_AMBULATORY_CARE_PROVIDER_SITE_OTHER): Payer: HMO | Admitting: Neurology

## 2021-05-15 ENCOUNTER — Encounter: Payer: Self-pay | Admitting: Neurology

## 2021-05-15 ENCOUNTER — Encounter (HOSPITAL_COMMUNITY): Payer: Self-pay

## 2021-05-15 ENCOUNTER — Emergency Department (HOSPITAL_COMMUNITY): Payer: HMO

## 2021-05-15 VITALS — BP 150/87 | HR 82

## 2021-05-15 DIAGNOSIS — R404 Transient alteration of awareness: Secondary | ICD-10-CM | POA: Diagnosis not present

## 2021-05-15 DIAGNOSIS — E1165 Type 2 diabetes mellitus with hyperglycemia: Secondary | ICD-10-CM | POA: Diagnosis not present

## 2021-05-15 DIAGNOSIS — N184 Chronic kidney disease, stage 4 (severe): Secondary | ICD-10-CM | POA: Diagnosis not present

## 2021-05-15 DIAGNOSIS — E1122 Type 2 diabetes mellitus with diabetic chronic kidney disease: Secondary | ICD-10-CM | POA: Diagnosis not present

## 2021-05-15 DIAGNOSIS — E039 Hypothyroidism, unspecified: Secondary | ICD-10-CM | POA: Diagnosis not present

## 2021-05-15 DIAGNOSIS — R531 Weakness: Secondary | ICD-10-CM | POA: Diagnosis not present

## 2021-05-15 DIAGNOSIS — I639 Cerebral infarction, unspecified: Secondary | ICD-10-CM | POA: Diagnosis not present

## 2021-05-15 DIAGNOSIS — R269 Unspecified abnormalities of gait and mobility: Secondary | ICD-10-CM | POA: Diagnosis not present

## 2021-05-15 DIAGNOSIS — Z79899 Other long term (current) drug therapy: Secondary | ICD-10-CM | POA: Insufficient documentation

## 2021-05-15 DIAGNOSIS — E785 Hyperlipidemia, unspecified: Secondary | ICD-10-CM | POA: Insufficient documentation

## 2021-05-15 DIAGNOSIS — Z951 Presence of aortocoronary bypass graft: Secondary | ICD-10-CM | POA: Insufficient documentation

## 2021-05-15 DIAGNOSIS — R5383 Other fatigue: Secondary | ICD-10-CM | POA: Diagnosis not present

## 2021-05-15 DIAGNOSIS — Z794 Long term (current) use of insulin: Secondary | ICD-10-CM | POA: Diagnosis not present

## 2021-05-15 DIAGNOSIS — E1140 Type 2 diabetes mellitus with diabetic neuropathy, unspecified: Secondary | ICD-10-CM | POA: Diagnosis not present

## 2021-05-15 DIAGNOSIS — Z20822 Contact with and (suspected) exposure to covid-19: Secondary | ICD-10-CM | POA: Diagnosis not present

## 2021-05-15 DIAGNOSIS — J9811 Atelectasis: Secondary | ICD-10-CM | POA: Diagnosis not present

## 2021-05-15 DIAGNOSIS — F0390 Unspecified dementia without behavioral disturbance: Secondary | ICD-10-CM | POA: Insufficient documentation

## 2021-05-15 DIAGNOSIS — Z7902 Long term (current) use of antithrombotics/antiplatelets: Secondary | ICD-10-CM | POA: Diagnosis not present

## 2021-05-15 DIAGNOSIS — I2510 Atherosclerotic heart disease of native coronary artery without angina pectoris: Secondary | ICD-10-CM | POA: Diagnosis not present

## 2021-05-15 DIAGNOSIS — W19XXXA Unspecified fall, initial encounter: Secondary | ICD-10-CM | POA: Diagnosis not present

## 2021-05-15 DIAGNOSIS — I10 Essential (primary) hypertension: Secondary | ICD-10-CM | POA: Diagnosis not present

## 2021-05-15 DIAGNOSIS — E1169 Type 2 diabetes mellitus with other specified complication: Secondary | ICD-10-CM | POA: Diagnosis not present

## 2021-05-15 DIAGNOSIS — R4182 Altered mental status, unspecified: Secondary | ICD-10-CM | POA: Insufficient documentation

## 2021-05-15 DIAGNOSIS — I129 Hypertensive chronic kidney disease with stage 1 through stage 4 chronic kidney disease, or unspecified chronic kidney disease: Secondary | ICD-10-CM | POA: Insufficient documentation

## 2021-05-15 DIAGNOSIS — F0150 Vascular dementia without behavioral disturbance: Secondary | ICD-10-CM | POA: Diagnosis not present

## 2021-05-15 DIAGNOSIS — I517 Cardiomegaly: Secondary | ICD-10-CM | POA: Diagnosis not present

## 2021-05-15 DIAGNOSIS — Z87891 Personal history of nicotine dependence: Secondary | ICD-10-CM | POA: Insufficient documentation

## 2021-05-15 LAB — COMPREHENSIVE METABOLIC PANEL
ALT: 15 U/L (ref 0–44)
AST: 13 U/L — ABNORMAL LOW (ref 15–41)
Albumin: 3.2 g/dL — ABNORMAL LOW (ref 3.5–5.0)
Alkaline Phosphatase: 66 U/L (ref 38–126)
Anion gap: 12 (ref 5–15)
BUN: 35 mg/dL — ABNORMAL HIGH (ref 8–23)
CO2: 19 mmol/L — ABNORMAL LOW (ref 22–32)
Calcium: 10.8 mg/dL — ABNORMAL HIGH (ref 8.9–10.3)
Chloride: 107 mmol/L (ref 98–111)
Creatinine, Ser: 2.5 mg/dL — ABNORMAL HIGH (ref 0.61–1.24)
GFR, Estimated: 25 mL/min — ABNORMAL LOW (ref >60)
Glucose, Bld: 219 mg/dL — ABNORMAL HIGH (ref 70–99)
Potassium: 4.3 mmol/L (ref 3.5–5.1)
Sodium: 138 mmol/L (ref 135–145)
Total Bilirubin: 0.7 mg/dL (ref 0.3–1.2)
Total Protein: 5.9 g/dL — ABNORMAL LOW (ref 6.5–8.1)

## 2021-05-15 LAB — I-STAT VENOUS BLOOD GAS, ED
Acid-base deficit: 2 mmol/L (ref 0.0–2.0)
Bicarbonate: 20.3 mmol/L (ref 20.0–28.0)
Calcium, Ion: 1.12 mmol/L — ABNORMAL LOW (ref 1.15–1.40)
HCT: 44 % (ref 39.0–52.0)
Hemoglobin: 15 g/dL (ref 13.0–17.0)
O2 Saturation: 89 %
Potassium: 4.1 mmol/L (ref 3.5–5.1)
Sodium: 137 mmol/L (ref 135–145)
TCO2: 21 mmol/L — ABNORMAL LOW (ref 22–32)
pCO2, Ven: 27.7 mmHg — ABNORMAL LOW (ref 44.0–60.0)
pH, Ven: 7.473 — ABNORMAL HIGH (ref 7.250–7.430)
pO2, Ven: 52 mmHg — ABNORMAL HIGH (ref 32.0–45.0)

## 2021-05-15 LAB — CBC WITH DIFFERENTIAL/PLATELET
Abs Immature Granulocytes: 0.08 10*3/uL — ABNORMAL HIGH (ref 0.00–0.07)
Basophils Absolute: 0.1 10*3/uL (ref 0.0–0.1)
Basophils Relative: 1 %
Eosinophils Absolute: 0.2 10*3/uL (ref 0.0–0.5)
Eosinophils Relative: 2 %
HCT: 50 % (ref 39.0–52.0)
Hemoglobin: 15.4 g/dL (ref 13.0–17.0)
Immature Granulocytes: 1 %
Lymphocytes Relative: 13 %
Lymphs Abs: 1.3 10*3/uL (ref 0.7–4.0)
MCH: 30.4 pg (ref 26.0–34.0)
MCHC: 30.8 g/dL (ref 30.0–36.0)
MCV: 98.6 fL (ref 80.0–100.0)
Monocytes Absolute: 0.7 10*3/uL (ref 0.1–1.0)
Monocytes Relative: 7 %
Neutro Abs: 7.5 10*3/uL (ref 1.7–7.7)
Neutrophils Relative %: 76 %
Platelets: 144 10*3/uL — ABNORMAL LOW (ref 150–400)
RBC: 5.07 MIL/uL (ref 4.22–5.81)
RDW: 16.5 % — ABNORMAL HIGH (ref 11.5–15.5)
WBC: 9.9 10*3/uL (ref 4.0–10.5)
nRBC: 0 % (ref 0.0–0.2)

## 2021-05-15 LAB — MAGNESIUM: Magnesium: 1.8 mg/dL (ref 1.7–2.4)

## 2021-05-15 LAB — LACTIC ACID, PLASMA
Lactic Acid, Venous: 1.9 mmol/L (ref 0.5–1.9)
Lactic Acid, Venous: 1.7 mmol/L (ref 0.5–1.9)

## 2021-05-15 LAB — BRAIN NATRIURETIC PEPTIDE: B Natriuretic Peptide: 213.6 pg/mL — ABNORMAL HIGH (ref 0.0–100.0)

## 2021-05-15 LAB — TROPONIN I (HIGH SENSITIVITY)
Troponin I (High Sensitivity): 8 ng/L (ref <18)
Troponin I (High Sensitivity): 8 ng/L (ref <18)

## 2021-05-15 LAB — RESP PANEL BY RT-PCR (FLU A&B, COVID) ARPGX2
Influenza A by PCR: NEGATIVE
Influenza B by PCR: NEGATIVE
SARS Coronavirus 2 by RT PCR: NEGATIVE

## 2021-05-15 LAB — LIPASE, BLOOD: Lipase: 63 U/L — ABNORMAL HIGH (ref 11–51)

## 2021-05-15 LAB — PROTIME-INR
INR: 1 (ref 0.8–1.2)
Prothrombin Time: 13.1 seconds (ref 11.4–15.2)

## 2021-05-15 LAB — TSH: TSH: 3.623 u[IU]/mL (ref 0.350–4.500)

## 2021-05-15 LAB — PHOSPHORUS: Phosphorus: 3.1 mg/dL (ref 2.5–4.6)

## 2021-05-15 NOTE — ED Notes (Signed)
[facility] called for pt. Family at bedside updated on plan of care and discharge instructions.

## 2021-05-15 NOTE — Progress Notes (Signed)
PATIENT: [name] DOB: 1934/03/20  REASON FOR VISIT: follow up HISTORY FROM: patient  HISTORY OF PRESENT ILLNESS: Today 05/15/21 [name] is an 87 year old male with history of right-sided stroke in September 2021.  Long-term history of memory disturbance and diabetic peripheral neuropathy. Remains on Plavix alone.  Hospitalized in March 2022 following multiple falls, multiple rib fractures, MRI of the brain did not show any new stroke. Is living at [facility]. Was at [place] prior to march hospitalization. He had a fall last week, June 1st, trying to get to bathroom, fell wasn't witnessed. They had to get him up. His son claims since the fall acting "very different", is tired, out of it. Facility did blood work, was unremarkable. Facility commented not himself since the fall. He had shingles few weeks ago, located on his right back. There was no mental status changes with shingles. Is now more confused, since the fall. MMSE 11/30. He isn't do any more walking, staying in wheelchair, can stand to pivot. He denies any pain or headache. Does have left sided weakness post-stroke. Reports ate breakfast. Falling asleep during visit, confused, much more than baseline. Here today with son, [name].  Labs reviewed from 05/09/2021 at facility, Sodium level 140, Potassium 4.1, glucose 326, creatinine 2.68, CBC unremarkable, UA culture was negative.  HISTORY 11/14/2020 [name]: [name] is an 86 year old right-handed white male with a history of a memory disturbance and a diabetic peripheral neuropathy followed through this office.  He has a chronic gait disorder and a history of chronic renal sufficiency and hypothyroidism and prior abdominal aortic aneurysm repair.  The patient was admitted to the hospital on 19 August 2020 with a 1 day history of slurred speech and left-sided weakness.  The patient had fallen multiple times on the day prior to admission.  He was brought in for  evaluation was noted to have a right thalamic capsular lacunar stroke.  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  The patient was placed on a combination of aspirin and Plavix for 6 weeks to be converted to Plavix alone after that.  The patient has been residing in assisted living at [facility].  The patient is getting physical and occupational therapy.  The is now able to walk with a walker, he does require some assistance with bathing and dressing.  He has significant swelling of both lower extremities.  He denies any problems with swallowing, his left-sided strength has improved some but has not returned to normal.  He has no numbness on the left side.  He denies any headaches or dizziness or vision changes or any problems controlling the bowels or the bladder.  He has not had a change in memory following the stroke.  He comes to this office for further evaluation.  His blood pressures have been well maintained, he is running systolic blood pressures in the 120s.   REVIEW OF SYSTEMS: Out of a complete 14 system review of symptoms, the patient complains only of the following symptoms, and all other reviewed systems are negative.  See HPI  ALLERGIES: Allergies  Allergen Reactions  . Nsaids     Other reaction(s): CKD    HOME MEDICATIONS: Outpatient Medications Prior to Visit  Medication Sig Dispense Refill  . acetaminophen (TYLENOL) 325 MG tablet Take 650 mg by mouth every 6 (six) hours as needed for mild pain, fever or headache.    Marland Kitchen  atorvastatin (LIPITOR) 40 MG tablet Take 40 mg by mouth daily.    . calcitRIOL (ROCALTROL) 0.25 MCG capsule Take 0.25 mcg by mouth in the morning and at bedtime.     . clopidogrel (PLAVIX) 75 MG tablet Take 1 tablet (75 mg total) by mouth daily.    Marland Kitchen donepezil (ARICEPT) 10 MG tablet TAKE 1 TABLET BY MOUTH ONCE DAILY AT BEDTIME (Patient taking  differently: Take 10 mg by mouth daily.) 90 tablet 0  . DULoxetine (CYMBALTA) 30 MG capsule Take 1 capsule (30 mg total) by mouth at bedtime. (Patient taking differently: Take 30 mg by mouth daily.) 30 capsule 3  . furosemide (LASIX) 40 MG tablet Take 40 mg by mouth daily.    Marland Kitchen gabapentin (NEURONTIN) 100 MG capsule Take 100 mg by mouth 2 (two) times daily.    . insulin aspart (NOVOLOG) 100 UNIT/ML injection Inject 0-15 Units into the skin 3 (three) times daily before meals. Sliding scale    . insulin glargine (LANTUS SOLOSTAR) 100 UNIT/ML Solostar Pen Inject 25 Units into the skin daily. (Patient taking differently: Inject 30 Units into the skin at bedtime.) 15 mL 11  . ketoconazole (NIZORAL) 2 % cream Apply 1 application topically daily.    Marland Kitchen levothyroxine (SYNTHROID, LEVOTHROID) 75 MCG tablet Take 75 mcg by mouth daily before breakfast.    . lidocaine (LIDODERM) 5 % Place 1 patch onto the skin daily. Remove & Discard patch within 12 hours or as directed by MD 30 patch 0  . loratadine (CLARITIN) 10 MG tablet Take 10 mg by mouth daily as needed for allergies.     Marland Kitchen metoCLOPramide (REGLAN) 10 MG tablet Take 1 tablet (10 mg total) by mouth every 6 (six) hours as needed for nausea (nausea/headache). 15 tablet 0  . metoprolol succinate (TOPROL-XL) 25 MG 24 hr tablet Take 1 tablet (25 mg total) by mouth daily. Hold if HR is less than 55 90 tablet 0  . mometasone (ELOCON) 0.1 % cream Apply topically.    . Multiple Vitamins-Minerals (MULTIVITAMIN ADULTS PO) Take 1 tablet by mouth daily.    . niacin (NIASPAN) 1000 MG CR tablet Take 1,000 mg by mouth 2 (two) times daily.    Glory Rosebush VERIO test strip     . polyethylene glycol (MIRALAX / GLYCOLAX) 17 g packet 1 packet mixed with 8 ounces of fluid    . potassium chloride (KLOR-CON) 10 MEQ tablet Take 1 tablet (10 mEq total) by mouth daily.  3  . pregabalin (LYRICA) 75 MG capsule Take 75 mg by mouth daily.    Marland Kitchen RELION INSULIN SYRINGE 1ML/31G 31G X 5/16" 1 ML  MISC USE AS DIRECTED FIVE TIMES DAILY    . saccharomyces boulardii (FLORASTOR) 250 MG capsule Take 1 capsule (250 mg total) by mouth 2 (two) times daily.    . sertraline (ZOLOFT) 50 MG tablet Take 50 mg by mouth daily.    . TRULICITY 1.5 LN/9.8XQ SOPN SMARTSIG:0.5 Milliliter(s) SUB-Q Once a Week    . valACYclovir (VALTREX) 500 MG tablet Take 500 mg by mouth daily as needed (out break).     . furosemide (LASIX) 80 MG tablet Take 0.5 tablets (40 mg total) by mouth daily.     No facility-administered medications prior to visit.    PAST MEDICAL HISTORY: Past Medical History:  Diagnosis Date  . Bilateral foot-drop 01/12/2020  . Coronary artery disease   . Diabetes mellitus without complication ([place])   . Diabetic neuropathy ([place]) 09/16/2017   Mild  bilateral foot drops  . Gait abnormality 09/16/2017  . Hyperlipidemia   . Hypertension   . Memory difficulty 03/06/2017  . Stroke ([place]) 08/18/2020  . Toxic goiter     PAST SURGICAL HISTORY: Past Surgical History:  Procedure Laterality Date  . ABDOMINAL AORTIC ANEURYSM REPAIR    . cardiac bypass    . CORONARY ARTERY BYPASS GRAFT      FAMILY HISTORY: Family History  Problem Relation Age of Onset  . Stroke Mother   . Stroke Father   . Heart attack Father   . Diabetes Sister   . Heart attack Sister   . Diabetes Brother   . Heart attack Brother   . Diabetes Brother   . Diabetes Brother   . Heart attack Brother     SOCIAL HISTORY: Social History   Socioeconomic History  . Marital status: Married    Spouse name: [name]  . Number of children: 3  . Years of education: 45  . Highest education level: Not on file  Occupational History  . Not on file  Tobacco Use  . Smoking status: Former Smoker    Packs/day: 1.00    Quit date: 03/31/1996    Years since quitting: 25.1  . Smokeless tobacco: Never Used  Vaping Use  . Vaping Use: Never used  Substance and Sexual Activity  . Alcohol use: No  . Drug use: No  . Sexual activity: Yes     Birth control/protection: None  Other Topics Concern  . Not on file  Social History Narrative   Lives  With spouse @ [facility]   Caffeine use:  Coffee  Seldom/mostly decaf drinks   Right-handed   Social Determinants of Health   Financial Resource Strain: Not on file  Food Insecurity: Not on file  Transportation Needs: Not on file  Physical Activity: Not on file  Stress: Not on file  Social Connections: Not on file  Intimate Partner Violence: Not on file   PHYSICAL EXAM  Vitals:   05/15/21 0951  BP: (!) 150/87  Pulse: 82   There is no height or weight on file to calculate BMI.  Generalized: Well developed, in no acute distress  MMSE - Mini Mental State Exam 05/15/2021 11/14/2020 01/12/2020  Not completed: - - -  Orientation to time 0 3 5  Orientation to Place 3 5 5   Registration 3 3 3   Attention/ Calculation 1 5 5   Recall 1 0 3  Language- name 2 objects 2 2 2   Language- repeat 1 1 1   Language- follow 3 step command 0 3 3  Language- read & follow direction 0 1 1  Write a sentence 0 1 1  Copy design 0 1 1  Total score 11 25 30    Neurological examination  Mentation: Alert, oriented to DOB, drowsy, falling asleep during visit, voice is weak, speech makes sense but is mumbled Cranial nerve II-XII: Pupils were equal round reactive to light. Extraocular movements were full, visual field were full on confrontational test. Head turning and shoulder shrug were normal and symmetric. Left sided facial droop noted.  Motor: Good strength overall, 4/5 weakness to left upper and lower extremity  Sensory: Sensory testing is intact to soft touch on all 4 extremities. No evidence of extinction is noted.  Coordination: difficulty with heel to shin, some dysmetria with finger nose finger bilaterally  Gait and station: In w/c, could not be ambulated  Reflexes: Deep tendon reflexes are symmetric but decreased throughout  DIAGNOSTIC DATA (  LABS, IMAGING, TESTING) - I reviewed patient  records, labs, notes, testing and imaging myself where available.  Lab Results  Component Value Date   WBC 5.7 02/22/2021   HGB 12.5 (L) 02/22/2021   HCT 37.5 (L) 02/22/2021   MCV 90.4 02/22/2021   PLT 102 (L) 02/22/2021      Component Value Date/Time   NA 135 02/24/2021 0002   K 3.5 02/24/2021 0002   CL 99 02/24/2021 0002   CO2 29 02/24/2021 0002   GLUCOSE 304 (H) 02/24/2021 0002   BUN 43 (H) 02/24/2021 0002   CREATININE 3.02 (H) 02/24/2021 0002   CREATININE 2.01 (H) 05/11/2015 1339   CALCIUM 8.8 (L) 02/24/2021 0002   PROT 5.5 (L) 02/21/2021 1312   PROT 5.8 (L) 01/12/2020 1605   ALBUMIN 2.9 (L) 02/21/2021 1312   AST 28 02/21/2021 1312   ALT 33 02/21/2021 1312   ALKPHOS 63 02/21/2021 1312   BILITOT 0.8 02/21/2021 1312   GFRNONAA 20 (L) 02/24/2021 0002   GFRAA 26 (L) 08/30/2020 0350   Lab Results  Component Value Date   CHOL 172 08/21/2020   HDL 49 08/21/2020   LDLCALC 96 08/21/2020   TRIG 136 08/21/2020   CHOLHDL 3.5 08/21/2020   Lab Results  Component Value Date   HGBA1C 10.1 (H) 02/21/2021   Lab Results  Component Value Date   VITAMINB12 1,333 (H) 01/12/2020   Lab Results  Component Value Date   TSH 4.484 08/20/2020   ASSESSMENT AND PLAN 87 y.o. year old male  has a past medical history of Bilateral foot-drop (01/12/2020), Coronary artery disease, Diabetes mellitus without complication ([place]), Diabetic neuropathy ([place]) (09/16/2017), Gait abnormality (09/16/2017), Hyperlipidemia, Hypertension, Memory difficulty (03/06/2017), Stroke ([place]) (08/18/2020), and Toxic goiter. here with"  1.  Recent right thalamic capsular stroke  2.  Memory disturbance  3.  Diabetic peripheral neuropathy  4.  Gait disturbance  -More confused since fall last week 6/1, unwitnessed, reportedly didn't hit his head, decline in mental status since then, 11/30 MMSE today (25/30 MMSE Dec 2021), falling asleep during visit , confused, family reports big change in mental status since fall;  recent shingles outbreak to right mid-back healing  -Will transport to ER via ambulance, given change in mental status/condition, concern for new stroke? Other etiology for change in mental status baseline, as presentation was similar to stroke back in Sept 2021; facility could not arrange transport otherwise  -Labs showed no significant abnormality (sent with EMS) from facility 05/09/21, UA culture was negative.   I spent 45 minutes of face-to-face and non-face-to-face time with patient.  This included previsit chart review, lab review, study review, discussing plan with his son, arranging ER transport/report to paramedics.   Butler Denmark, AGNP-C, DNP 05/15/2021, 10:44 AM [facility] [address], [place], [place] [address] [phone]

## 2021-05-15 NOTE — ED Provider Notes (Signed)
[facility] EMERGENCY DEPARTMENT Provider Note   CSN: [id number] Arrival date & time: 05/15/21  1136     History No chief complaint on file.   [name] is a 87 y.o. male.  HPI Currently there is very limited history.  History comes by EMS and report from nursing home.  Patient is sent from [facility] in [place] for mental status change.  Per facility patient has been increasingly lethargic since a fall 6 days ago.  Reportedly patient was not evaluated after the fall.  Reportedly at baseline the patient is verbally interactive and wheelchair-bound.  He has history of dementia and diabetes.  The patient denies any complaints at this time.  He is denying he has pain.  He denies shortness of breath.  Physical exam he does have notably what is consistent with a large area of shingles on the right chest wall that is in the resolution phase.  There is erythema and healing of prior lesions.  Patient does seem uncomfortable with movement however in this area.    Past Medical History:  Diagnosis Date  . Bilateral foot-drop 01/12/2020  . Coronary artery disease   . Diabetes mellitus without complication ([place])   . Diabetic neuropathy ([place]) 09/16/2017   Mild bilateral foot drops  . Gait abnormality 09/16/2017  . Hyperlipidemia   . Hypertension   . Memory difficulty 03/06/2017  . Stroke ([place]) 08/18/2020  . Toxic goiter     Patient Active Problem List   Diagnosis Date Noted  . Rib fractures 02/22/2021  . 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 Stasis dermatitis of both legs 02/21/2021  . Multiple rib fractures 02/21/2021  . Bilateral edema of lower extremity 02/21/2021  . Vomiting in adult   . Ischemic stroke ([place]) 08/20/2020  . Thrombocytopenia ([place]) 08/20/2020  . Hypothyroidism 08/20/2020  . Dementia ([place]) 08/20/2020  . Depression 08/20/2020  . Cellulitis 08/20/2020  . Bilateral foot-drop 01/12/2020  .  Diabetic neuropathy ([place]) 09/16/2017  . Gait abnormality 09/16/2017  . Memory difficulty 03/06/2017  . GIB (gastrointestinal bleeding) 03/31/2016  . Rectal bleeding   . Chronic kidney disease, stage IV (severe) ([place]) 03/23/2015  . Coronary artery disease 03/22/2014  . Essential hypertension 03/22/2014  . Hyperlipidemia 03/22/2014  . Multinodular goiter 03/22/2014  . DM type 2, goal HbA1c < 7% (HCC) 03/22/2014  . Abdominal aortic aneurysm ([place]) 03/04/2012    Past Surgical History:  Procedure Laterality Date  . ABDOMINAL AORTIC ANEURYSM REPAIR    . cardiac bypass    . CORONARY ARTERY BYPASS GRAFT         Family History  Problem Relation Age of Onset  . Stroke Mother   . Stroke Father   . Heart attack Father   . Diabetes Sister   . Heart attack Sister   . Diabetes Brother   . Heart attack Brother   . Diabetes Brother   . Diabetes Brother   . Heart attack Brother     Social History   Tobacco Use  . Smoking status: Former Smoker    Packs/day: 1.00    Quit date: 03/31/1996    Years since quitting: 25.1  . Smokeless tobacco: Never Used  Vaping Use  . Vaping Use: Never used  Substance Use Topics  . Alcohol use: No  . Drug use: No    Home Medications Prior to Admission medications   Medication Sig Start Date End Date Taking? Authorizing  Provider  acetaminophen (TYLENOL) 325 MG tablet Take 650 mg by mouth every 6 (six) hours as needed for mild pain, fever or headache.    [provider]  atorvastatin (LIPITOR) 40 MG tablet Take 40 mg by mouth daily.    [provider]  calcitRIOL (ROCALTROL) 0.25 MCG capsule Take 0.25 mcg by mouth in the morning and at bedtime.  04/29/17   [provider]  clopidogrel (PLAVIX) 75 MG tablet Take 1 tablet (75 mg total) by mouth daily. 08/25/20   Bonnielee Haff, MD  donepezil (ARICEPT) 10 MG tablet TAKE 1 TABLET BY MOUTH ONCE DAILY AT BEDTIME Patient taking differently: Take 10 mg by mouth daily. 01/10/20    Kathrynn Ducking, MD  DULoxetine (CYMBALTA) 30 MG capsule Take 1 capsule (30 mg total) by mouth at bedtime. Patient taking differently: Take 30 mg by mouth daily. 01/12/20   Kathrynn Ducking, MD  furosemide (LASIX) 40 MG tablet Take 40 mg by mouth daily. 04/23/21   [provider]  gabapentin (NEURONTIN) 100 MG capsule Take 100 mg by mouth 2 (two) times daily.    [provider]  insulin aspart (NOVOLOG) 100 UNIT/ML injection Inject 0-15 Units into the skin 3 (three) times daily before meals. Sliding scale    [provider]  insulin glargine (LANTUS SOLOSTAR) 100 UNIT/ML Solostar Pen Inject 25 Units into the skin daily. Patient taking differently: Inject 30 Units into the skin at bedtime. 08/30/20   Bonnielee Haff, MD  ketoconazole (NIZORAL) 2 % cream Apply 1 application topically daily.    [provider]  levothyroxine (SYNTHROID, LEVOTHROID) 75 MCG tablet Take 75 mcg by mouth daily before breakfast.    [provider]  lidocaine (LIDODERM) 5 % Place 1 patch onto the skin daily. Remove & Discard patch within 12 hours or as directed by MD 02/23/21   Barb Merino, MD  loratadine (CLARITIN) 10 MG tablet Take 10 mg by mouth daily as needed for allergies.     [provider]  metoCLOPramide (REGLAN) 10 MG tablet Take 1 tablet (10 mg total) by mouth every 6 (six) hours as needed for nausea (nausea/headache). 09/20/20   Deno Etienne, DO  metoprolol succinate (TOPROL-XL) 25 MG 24 hr tablet Take 1 tablet (25 mg total) by mouth daily. Hold if HR is less than 55 02/24/21   Ghimire, Dante Gang, MD  mometasone (ELOCON) 0.1 % cream Apply topically. 05/02/21   [provider]  Multiple Vitamins-Minerals (MULTIVITAMIN ADULTS PO) Take 1 tablet by mouth daily.    [provider]  niacin (NIASPAN) 1000 MG CR tablet Take 1,000 mg by mouth 2 (two) times daily.    [provider]  [facility] VERIO test strip  01/12/19   [provider]   polyethylene glycol (MIRALAX / GLYCOLAX) 17 g packet 1 packet mixed with 8 ounces of fluid    [provider]  potassium chloride (KLOR-CON) 10 MEQ tablet Take 1 tablet (10 mEq total) by mouth daily. 08/30/20   Bonnielee Haff, MD  pregabalin (LYRICA) 75 MG capsule Take 75 mg by mouth daily. 04/27/21   [provider]  [place] 1ML/31G 31G X 5/16" 1 ML MISC USE AS DIRECTED FIVE TIMES DAILY 12/10/19   [provider]  saccharomyces boulardii (FLORASTOR) 250 MG capsule Take 1 capsule (250 mg total) by mouth 2 (two) times daily. 08/24/20   Bonnielee Haff, MD  sertraline (ZOLOFT) 50 MG tablet Take 50 mg by mouth daily.  [provider]  TRULICITY 1.5 MG/8.6PY SOPN SMARTSIG:0.5 Milliliter(s) SUB-Q Once a Week 04/02/21   [provider]  valACYclovir (VALTREX) 500 MG tablet Take 500 mg by mouth daily as needed (out break).     [provider]    Allergies    Nsaids  Review of Systems   Review of Systems Level 5 caveat cannot obtain review of systems due to combination of dementia and confusion. Physical Exam Updated Vital Signs BP 125/70   Pulse 73   Temp 98.2 F (36.8 C) (Oral)   Resp 17   SpO2 100%   Physical Exam Constitutional:      Comments: Patient is resting quietly as I enter the room.  He is not agitated.  No respiratory distress.  He does awaken to voice.  He predominately keeps his eyes closed during the exam unless I specifically asked him to look at me.  HENT:     Head: Normocephalic and atraumatic.     Nose: Nose normal.     Mouth/Throat:     Mouth: Mucous membranes are dry.  Eyes:     Extraocular Movements: Extraocular movements intact.     Pupils: Pupils are equal, round, and reactive to light.  Cardiovascular:     Rate and Rhythm: Normal rate and regular rhythm.     Pulses: Normal pulses.     Heart sounds: Murmur heard.    Pulmonary:     Effort: Pulmonary effort is normal.     Breath sounds:  Normal breath sounds.  Abdominal:     General: There is no distension.     Palpations: Abdomen is soft.     Tenderness: There is no abdominal tenderness. There is no guarding.  Musculoskeletal:     Cervical back: Neck supple.     Comments: No significant peripheral edema.  Both feet are warm and dry without active wounds or significant cellulitis.  Skin:    Comments: Large patch of erythematous macules in a band pattern on the right lower chest wall.  There are a few dried eschar is within this.  This is consistent with resolving zoster  Neurological:     Comments: Patient is very somnolent.  He does follow commands.  He will take deep inspirations.  With instructions he will grip with the opposite hand rail to roll in the stretcher.  Patient does appear to have a left facial droop.  At times he seemed to have a symmetric grip strength but with retesting he would perform grip strength bilaterally.  He is slow and seems weak to perform lower extremity testing but then will comply somewhat.  I cannot distinctly identify a focal deficit other than what appears to be a fairly significant left facial droop.     ED Results / Procedures / Treatments   Labs (all labs ordered are listed, but only abnormal results are displayed) Labs Reviewed  COMPREHENSIVE METABOLIC PANEL - Abnormal; Notable for the following components:      Result Value   CO2 19 (*)    Glucose, Bld 219 (*)    BUN 35 (*)    Creatinine, Ser 2.50 (*)    Calcium 10.8 (*)    Total Protein 5.9 (*)    Albumin 3.2 (*)    AST 13 (*)    GFR, Estimated 25 (*)    All other components within normal limits  LIPASE, BLOOD - Abnormal; Notable for the following components:   Lipase 63 (*)    All  other components within normal limits  BRAIN NATRIURETIC PEPTIDE - Abnormal; Notable for the following components:   B Natriuretic Peptide 213.6 (*)    All other components within normal limits  CBC WITH DIFFERENTIAL/PLATELET - Abnormal; Notable  for the following components:   RDW 16.5 (*)    Platelets 144 (*)    Abs Immature Granulocytes 0.08 (*)    All other components within normal limits  I-STAT VENOUS BLOOD GAS, ED - Abnormal; Notable for the following components:   pH, Ven 7.473 (*)    pCO2, Ven 27.7 (*)    pO2, Ven 52.0 (*)    TCO2 21 (*)    Calcium, Ion 1.12 (*)    All other components within normal limits  RESP PANEL BY RT-PCR (FLU A&B, COVID) ARPGX2  LACTIC ACID, PLASMA  PROTIME-INR  MAGNESIUM  PHOSPHORUS  TSH  LACTIC ACID, PLASMA  URINALYSIS, ROUTINE W REFLEX MICROSCOPIC  RAPID URINE DRUG SCREEN, HOSP PERFORMED  TROPONIN I (HIGH SENSITIVITY)  TROPONIN I (HIGH SENSITIVITY)    EKG EKG Interpretation  Date/Time:  Tuesday May 15 2021 12:04:54 EDT Ventricular Rate:  61 PR Interval:  178 QRS Duration: 100 QT Interval:  419 QTC Calculation: 422 R Axis:   -34 Text Interpretation: Sinus rhythm Left axis deviation Low voltage, precordial leads no acute ischemic appearance. n sig change from previous Confirmed by Charlesetta Shanks [phone]) on 05/15/2021 1:59:11 PM   Radiology CT Head Wo Contrast  Result Date: 05/15/2021 CLINICAL DATA:  Mild mental status change. Lethargy since falling 6 days ago. EXAM: CT HEAD WITHOUT CONTRAST TECHNIQUE: Contiguous axial images were obtained from the base of the skull through the vertex without intravenous contrast. COMPARISON:  CT head 02/21/2021.  MRI head 02/22/2021. FINDINGS: Brain: There is no evidence of acute intracranial hemorrhage, mass lesion, brain edema or extra-axial fluid collection. Stable mild atrophy with mild prominence of the ventricles and subarachnoid spaces. There are stable chronic small vessel ischemic changes with old lacunar infarcts in the right basal ganglia and thalamus. There is no CT evidence of acute cortical infarction. Vascular: Prominent intracranial vascular calcifications. No hyperdense vessel identified. Skull: Negative for fracture or focal lesion.  Sinuses/Orbits: Stable small mucous retention cyst in the left maxillary sinus. The additional paranasal sinuses, mastoid air cells and middle ears appear clear. No significant orbital findings. Other: None. IMPRESSION: Stable examination without acute intracranial findings. Chronic atrophy and remote infarcts in the right basal ganglia and thalamus. Electronically Signed   By: Richardean Sale M.D.   On: 05/15/2021 14:19   DG Chest Port 1 View  Result Date: 05/15/2021 CLINICAL DATA:  Mental status change. EXAM: PORTABLE CHEST 1 VIEW COMPARISON:  02/22/2021.  10/24/2007. FINDINGS: Prior CABG. Cardiomegaly. No pulmonary venous congestion. Low lung volumes with mild bibasilar atelectasis. Mild left base pleural thickening consistent scarring. No pneumothorax. IMPRESSION: 1.  Prior CABG.  Cardiomegaly.  No pulmonary venous congestion. 2. Low lung volumes with mild bibasilar atelectasis. Mild left pleural scarring. Electronically Signed   By: Marcello Moores  Register   On: 05/15/2021 13:21    Procedures Procedures   Medications Ordered in ED Medications - No data to display  ED Course  I have reviewed the triage vital signs and the nursing notes.  Pertinent labs & imaging results that were available during my care of the patient were reviewed by me and considered in my medical decision making (see chart for details).    MDM Rules/Calculators/A&P  Upon recheck, patient is more alert and interactive.  He is situationally oriented and speaking in full sentences.  CT does not show any evidence of acute or subacute CVA nor subdural hematoma.  Labs are within normal limits and vital signs are stable.  At this time consideration given to oversedation with Neurontin and tramadol listed on patient's med rec.  Patient does have resolving shingles and I suspect would be getting some higher dosing of pain medication.  At this time plan will be to decrease or discontinue these medications if  possible, encourage oral intake and try to increase activity level cautiously.  Careful return precautions included in discharge instructions.  Final Clinical Impression(s) / ED Diagnoses Final diagnoses:  Altered mental status, unspecified altered mental status type    Rx / DC Orders ED Discharge Orders    None       Charlesetta Shanks, MD 05/15/21 [phone]

## 2021-05-15 NOTE — Progress Notes (Signed)
I have read the note, and I agree with the clinical assessment and plan.  Phinley Schall K Rosemary Mossbarger   

## 2021-05-15 NOTE — ED Triage Notes (Signed)
Pt bib [facility] from [facility] in [place] for [place]. Facility complains that pt has been increasingly lethargic since a fall on 05/09/21. Pt was not sent to hospital after fall. Baseline very talkative and wheelchair bound.  Hx of dementia and diabetes. EMS CBG 333

## 2021-05-15 NOTE — Discharge Instructions (Addendum)
1.  At this time, the patient's CT scans and labs do not show significant abnormalities. 2.  Try to decrease the use of Neurontin and tramadol as much as possible.  These medications are sedating and may be contributing to decreased activity level and confusion. 3.  Carefully monitor patient's temperature and vital signs.  If he develops fever, persisting or worsening confusion, other signs of acute illness return to the emergency department immediately.  Have a recheck with the facility provider within the next 1 to 3 days. 2.  Encourage fluids and some activity with assistance and fall precautions.

## 2021-05-15 NOTE — ED Notes (Signed)
Patient transported to CT 

## 2021-05-16 DIAGNOSIS — R402411 Glasgow coma scale score 13-15, in the field [EMT or ambulance]: Secondary | ICD-10-CM | POA: Diagnosis not present

## 2021-05-17 DIAGNOSIS — I517 Cardiomegaly: Secondary | ICD-10-CM | POA: Diagnosis not present

## 2021-05-17 DIAGNOSIS — N184 Chronic kidney disease, stage 4 (severe): Secondary | ICD-10-CM | POA: Diagnosis not present

## 2021-05-17 DIAGNOSIS — R053 Chronic cough: Secondary | ICD-10-CM | POA: Diagnosis not present

## 2021-05-18 DIAGNOSIS — B029 Zoster without complications: Secondary | ICD-10-CM | POA: Diagnosis not present

## 2021-05-18 DIAGNOSIS — G629 Polyneuropathy, unspecified: Secondary | ICD-10-CM | POA: Diagnosis not present

## 2021-05-18 DIAGNOSIS — I69354 Hemiplegia and hemiparesis following cerebral infarction affecting left non-dominant side: Secondary | ICD-10-CM | POA: Diagnosis not present

## 2021-05-18 DIAGNOSIS — I639 Cerebral infarction, unspecified: Secondary | ICD-10-CM | POA: Diagnosis not present

## 2021-05-18 DIAGNOSIS — D649 Anemia, unspecified: Secondary | ICD-10-CM | POA: Diagnosis not present

## 2021-05-18 DIAGNOSIS — I10 Essential (primary) hypertension: Secondary | ICD-10-CM | POA: Diagnosis not present

## 2021-05-18 DIAGNOSIS — N19 Unspecified kidney failure: Secondary | ICD-10-CM | POA: Diagnosis not present

## 2021-05-18 DIAGNOSIS — F0390 Unspecified dementia without behavioral disturbance: Secondary | ICD-10-CM | POA: Diagnosis not present

## 2021-05-18 DIAGNOSIS — N184 Chronic kidney disease, stage 4 (severe): Secondary | ICD-10-CM | POA: Diagnosis not present

## 2021-05-18 DIAGNOSIS — E1140 Type 2 diabetes mellitus with diabetic neuropathy, unspecified: Secondary | ICD-10-CM | POA: Diagnosis not present

## 2021-05-21 DIAGNOSIS — F3289 Other specified depressive episodes: Secondary | ICD-10-CM | POA: Diagnosis not present

## 2021-05-21 DIAGNOSIS — E1122 Type 2 diabetes mellitus with diabetic chronic kidney disease: Secondary | ICD-10-CM | POA: Diagnosis not present

## 2021-05-21 DIAGNOSIS — I6389 Other cerebral infarction: Secondary | ICD-10-CM | POA: Diagnosis not present

## 2021-05-21 DIAGNOSIS — E038 Other specified hypothyroidism: Secondary | ICD-10-CM | POA: Diagnosis not present

## 2021-05-21 DIAGNOSIS — I129 Hypertensive chronic kidney disease with stage 1 through stage 4 chronic kidney disease, or unspecified chronic kidney disease: Secondary | ICD-10-CM | POA: Diagnosis not present

## 2021-05-21 DIAGNOSIS — F0280 Dementia in other diseases classified elsewhere without behavioral disturbance: Secondary | ICD-10-CM | POA: Diagnosis not present

## 2021-05-21 DIAGNOSIS — N183 Chronic kidney disease, stage 3 unspecified: Secondary | ICD-10-CM | POA: Diagnosis not present

## 2021-05-22 DIAGNOSIS — E783 Hyperchylomicronemia: Secondary | ICD-10-CM | POA: Diagnosis not present

## 2021-05-25 DIAGNOSIS — R112 Nausea with vomiting, unspecified: Secondary | ICD-10-CM | POA: Diagnosis not present

## 2021-05-26 DIAGNOSIS — I872 Venous insufficiency (chronic) (peripheral): Secondary | ICD-10-CM | POA: Diagnosis not present

## 2021-05-26 DIAGNOSIS — N390 Urinary tract infection, site not specified: Secondary | ICD-10-CM | POA: Diagnosis not present

## 2021-05-28 DIAGNOSIS — K219 Gastro-esophageal reflux disease without esophagitis: Secondary | ICD-10-CM | POA: Diagnosis not present

## 2021-05-28 DIAGNOSIS — R296 Repeated falls: Secondary | ICD-10-CM | POA: Diagnosis not present

## 2021-05-28 DIAGNOSIS — R41841 Cognitive communication deficit: Secondary | ICD-10-CM | POA: Diagnosis not present

## 2021-05-28 DIAGNOSIS — N390 Urinary tract infection, site not specified: Secondary | ICD-10-CM | POA: Diagnosis not present

## 2021-05-28 DIAGNOSIS — E1122 Type 2 diabetes mellitus with diabetic chronic kidney disease: Secondary | ICD-10-CM | POA: Diagnosis not present

## 2021-05-28 DIAGNOSIS — M21371 Foot drop, right foot: Secondary | ICD-10-CM | POA: Diagnosis not present

## 2021-05-28 DIAGNOSIS — M21372 Foot drop, left foot: Secondary | ICD-10-CM | POA: Diagnosis not present

## 2021-05-28 DIAGNOSIS — I6930 Unspecified sequelae of cerebral infarction: Secondary | ICD-10-CM | POA: Diagnosis not present

## 2021-05-28 DIAGNOSIS — F0390 Unspecified dementia without behavioral disturbance: Secondary | ICD-10-CM | POA: Diagnosis not present

## 2021-05-28 DIAGNOSIS — S2242XD Multiple fractures of ribs, left side, subsequent encounter for fracture with routine healing: Secondary | ICD-10-CM | POA: Diagnosis not present

## 2021-05-28 DIAGNOSIS — I69354 Hemiplegia and hemiparesis following cerebral infarction affecting left non-dominant side: Secondary | ICD-10-CM | POA: Diagnosis not present

## 2021-05-28 DIAGNOSIS — I872 Venous insufficiency (chronic) (peripheral): Secondary | ICD-10-CM | POA: Diagnosis not present

## 2021-05-28 DIAGNOSIS — I739 Peripheral vascular disease, unspecified: Secondary | ICD-10-CM | POA: Diagnosis not present

## 2021-05-28 DIAGNOSIS — E1140 Type 2 diabetes mellitus with diabetic neuropathy, unspecified: Secondary | ICD-10-CM | POA: Diagnosis not present

## 2021-05-28 DIAGNOSIS — R1311 Dysphagia, oral phase: Secondary | ICD-10-CM | POA: Diagnosis not present

## 2021-05-29 DIAGNOSIS — R4182 Altered mental status, unspecified: Secondary | ICD-10-CM | POA: Diagnosis not present

## 2021-05-29 DIAGNOSIS — N184 Chronic kidney disease, stage 4 (severe): Secondary | ICD-10-CM | POA: Diagnosis not present

## 2021-05-30 DIAGNOSIS — I129 Hypertensive chronic kidney disease with stage 1 through stage 4 chronic kidney disease, or unspecified chronic kidney disease: Secondary | ICD-10-CM | POA: Diagnosis not present

## 2021-05-30 DIAGNOSIS — N390 Urinary tract infection, site not specified: Secondary | ICD-10-CM | POA: Diagnosis not present

## 2021-06-05 DIAGNOSIS — N390 Urinary tract infection, site not specified: Secondary | ICD-10-CM | POA: Diagnosis not present

## 2021-06-06 DIAGNOSIS — I639 Cerebral infarction, unspecified: Secondary | ICD-10-CM | POA: Diagnosis not present

## 2021-06-06 DIAGNOSIS — N184 Chronic kidney disease, stage 4 (severe): Secondary | ICD-10-CM | POA: Diagnosis not present

## 2021-06-06 DIAGNOSIS — E1140 Type 2 diabetes mellitus with diabetic neuropathy, unspecified: Secondary | ICD-10-CM | POA: Diagnosis not present

## 2021-06-06 DIAGNOSIS — L270 Generalized skin eruption due to drugs and medicaments taken internally: Secondary | ICD-10-CM | POA: Diagnosis not present

## 2021-06-08 DIAGNOSIS — I739 Peripheral vascular disease, unspecified: Secondary | ICD-10-CM | POA: Diagnosis not present

## 2021-06-08 DIAGNOSIS — R296 Repeated falls: Secondary | ICD-10-CM | POA: Diagnosis not present

## 2021-06-08 DIAGNOSIS — M21371 Foot drop, right foot: Secondary | ICD-10-CM | POA: Diagnosis not present

## 2021-06-08 DIAGNOSIS — M21372 Foot drop, left foot: Secondary | ICD-10-CM | POA: Diagnosis not present

## 2021-06-08 DIAGNOSIS — E1122 Type 2 diabetes mellitus with diabetic chronic kidney disease: Secondary | ICD-10-CM | POA: Diagnosis not present

## 2021-06-08 DIAGNOSIS — R1311 Dysphagia, oral phase: Secondary | ICD-10-CM | POA: Diagnosis not present

## 2021-06-08 DIAGNOSIS — F0390 Unspecified dementia without behavioral disturbance: Secondary | ICD-10-CM | POA: Diagnosis not present

## 2021-06-08 DIAGNOSIS — I6930 Unspecified sequelae of cerebral infarction: Secondary | ICD-10-CM | POA: Diagnosis not present

## 2021-06-08 DIAGNOSIS — I69354 Hemiplegia and hemiparesis following cerebral infarction affecting left non-dominant side: Secondary | ICD-10-CM | POA: Diagnosis not present

## 2021-06-08 DIAGNOSIS — R41841 Cognitive communication deficit: Secondary | ICD-10-CM | POA: Diagnosis not present

## 2021-06-08 DIAGNOSIS — I872 Venous insufficiency (chronic) (peripheral): Secondary | ICD-10-CM | POA: Diagnosis not present

## 2021-06-08 DIAGNOSIS — E1140 Type 2 diabetes mellitus with diabetic neuropathy, unspecified: Secondary | ICD-10-CM | POA: Diagnosis not present

## 2021-06-08 DIAGNOSIS — S2242XD Multiple fractures of ribs, left side, subsequent encounter for fracture with routine healing: Secondary | ICD-10-CM | POA: Diagnosis not present

## 2021-06-12 DIAGNOSIS — E785 Hyperlipidemia, unspecified: Secondary | ICD-10-CM | POA: Diagnosis not present

## 2021-06-26 DIAGNOSIS — E1140 Type 2 diabetes mellitus with diabetic neuropathy, unspecified: Secondary | ICD-10-CM | POA: Diagnosis not present

## 2021-07-13 IMAGING — MR MR HEAD W/O CM
12 of 13 series · 44 of 48 positions shown · non-contrast
Comparison: CT head without contrast 08/19/2020

CLINICAL DATA: Stroke, follow-up. Acute onset of left-sided
weakness and abnormal gait. Dysarthria.

EXAM:
MRI HEAD WITHOUT CONTRAST
TECHNIQUE: Multiplanar, multiecho pulse sequences of the brain and surrounding
structures were obtained without intravenous contrast.

[Series 5: DWI · axial · 3.0mm · 0.88mm/px · z∈[-86,+60]mm · 8 of 100 slices shown (1 of 4)]
[im 1/100]
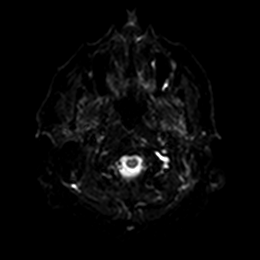
[im 15/100]
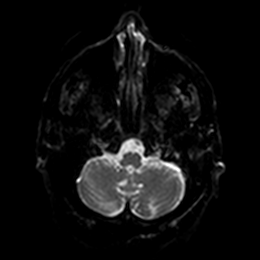
[im 29/100]
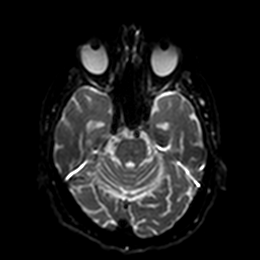
[im 43/100]
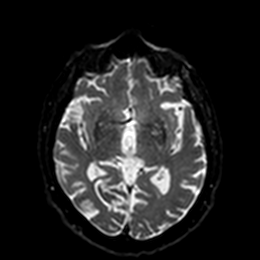
[im 57/100]
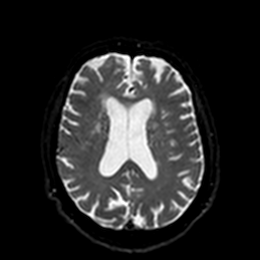
[im 71/100]
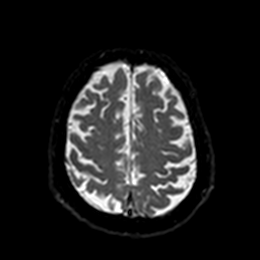
[im 85/100]
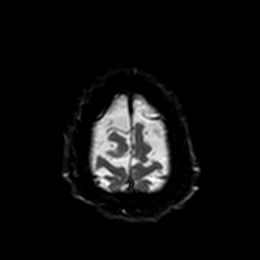
[im 100/100]
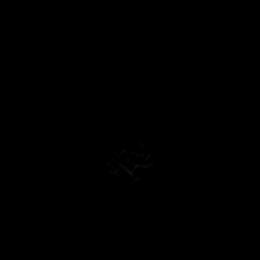

[Series 6: DWI · axial · 3.0mm · 0.88mm/px · z∈[-86,+60]mm · 5 of 50 slices shown (2 of 4)]
[im 1/50]
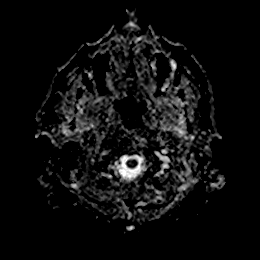
[im 13/50]
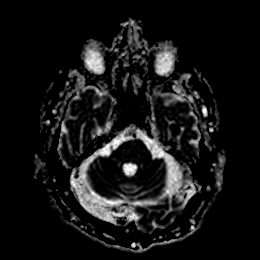
[im 25/50]
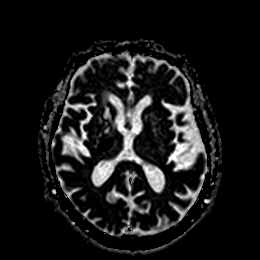
[im 37/50]
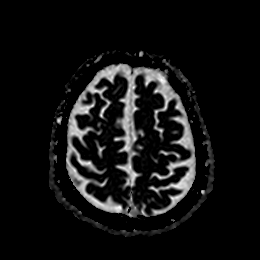
[im 50/50]
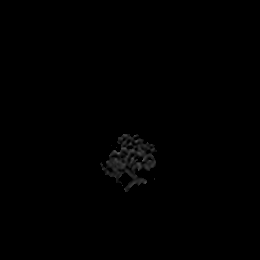

[Series 7: DWI · coronal · 4.0mm · 0.88mm/px · 5 of 68 slices shown (3 of 4)]
[im 1/68]
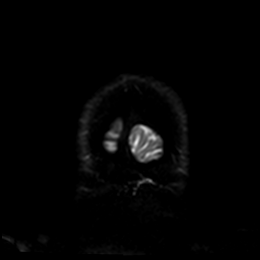
[im 17/68]
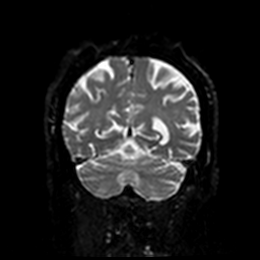
[im 34/68]
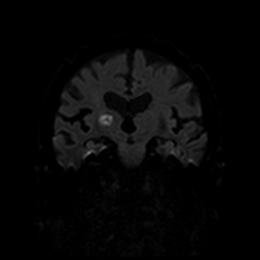
[im 51/68]
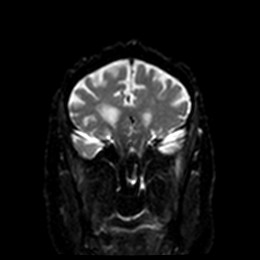
[im 68/68]
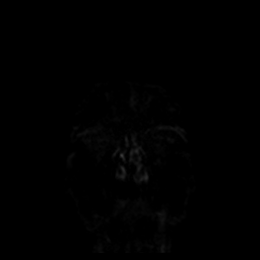

[Series 8: DWI · coronal · 4.0mm · 0.88mm/px · 2 of 33 slices shown (4 of 4)]
[im 1/33]
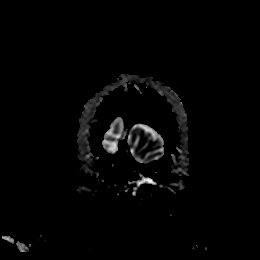
[im 33/33]
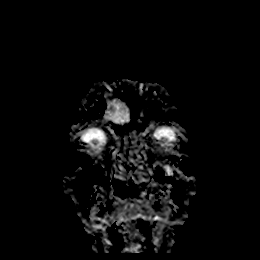

[Series 9: T1 · sagittal · 5.0mm · 0.75mm/px · 2 of 23 slices shown]
[im 1/23]
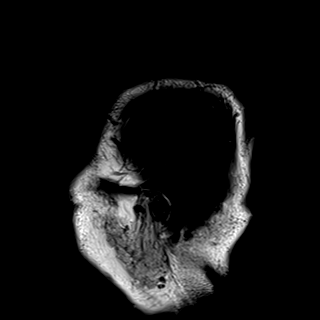
[im 23/23]
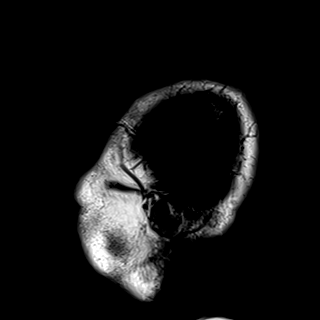

[Series 10: T2 · axial · 5.0mm · 0.72mm/px · z∈[-92,+51]mm · 2 of 25 slices shown (1 of 2)]
[im 1/25]
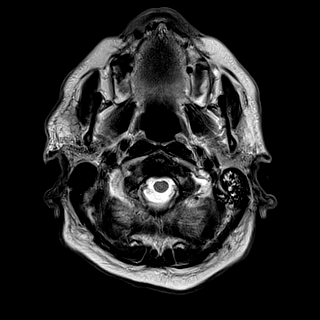
[im 25/25]
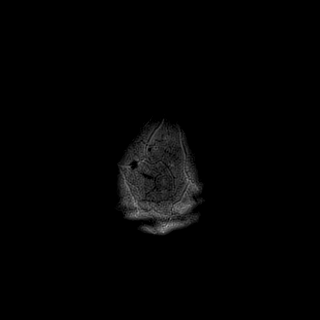

[Series 11: FLAIR · axial · 5.0mm · 0.45mm/px · z∈[-91,+52]mm · 2 of 25 slices shown]
[im 1/25]
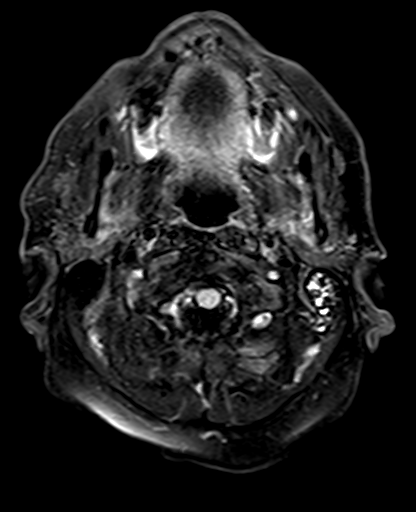
[im 25/25]
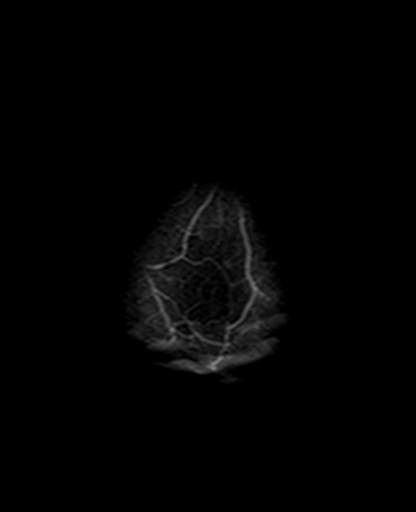

[Series 12: mag_images · axial · 3.0mm · 0.90mm/px · z∈[-105,+71]mm · 4 of 60 slices shown]
[im 1/60]
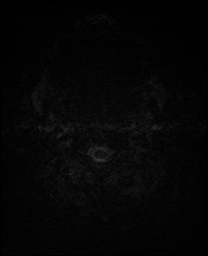
[im 20/60]
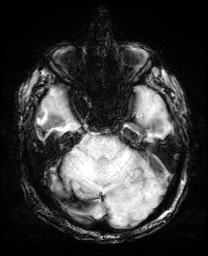
[im 40/60]
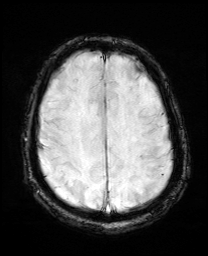
[im 60/60]
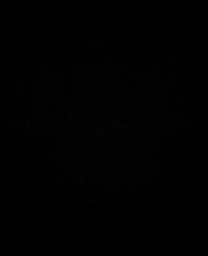

[Series 13: pha_images · axial · 3.0mm · 0.90mm/px · z∈[-102,+71]mm · 4 of 57 slices shown]
[im 1/57]
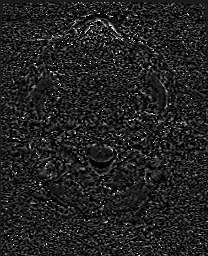
[im 19/57]
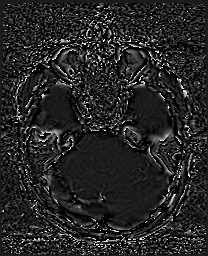
[im 38/57]
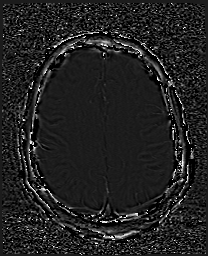
[im 57/57]
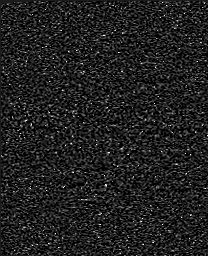

[Series 14: swi_images · axial · 3.0mm · 0.90mm/px · z∈[-105,+71]mm · 4 of 60 slices shown]
[im 1/60]
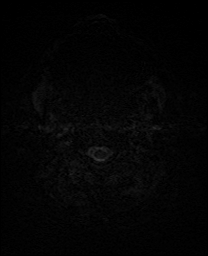
[im 20/60]
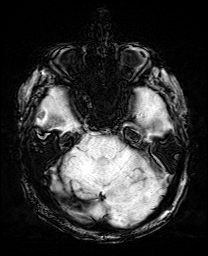
[im 40/60]
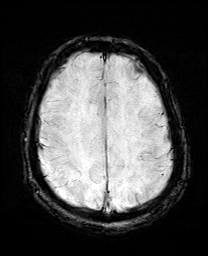
[im 60/60]
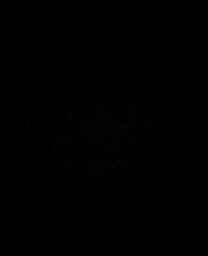

[Series 15: mip_images(sw) · axial · 24.0mm · 0.90mm/px · z∈[-94,+61]mm · 4 of 53 slices shown]
[im 1/53]
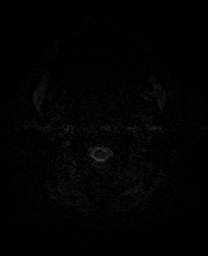
[im 18/53]
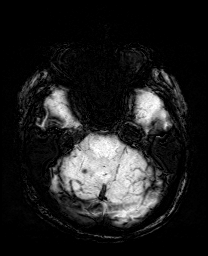
[im 35/53]
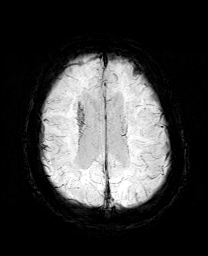
[im 53/53]
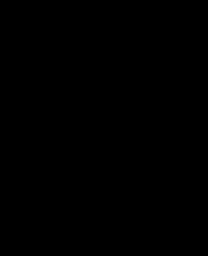

[Series 17: T2 · coronal · 5.0mm · 0.34mm/px · 2 of 29 slices shown (2 of 2)]
[im 1/29]
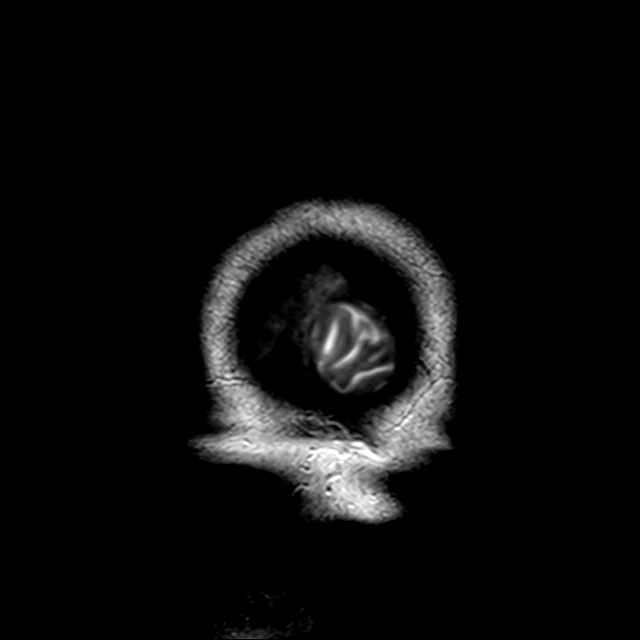
[im 29/29]
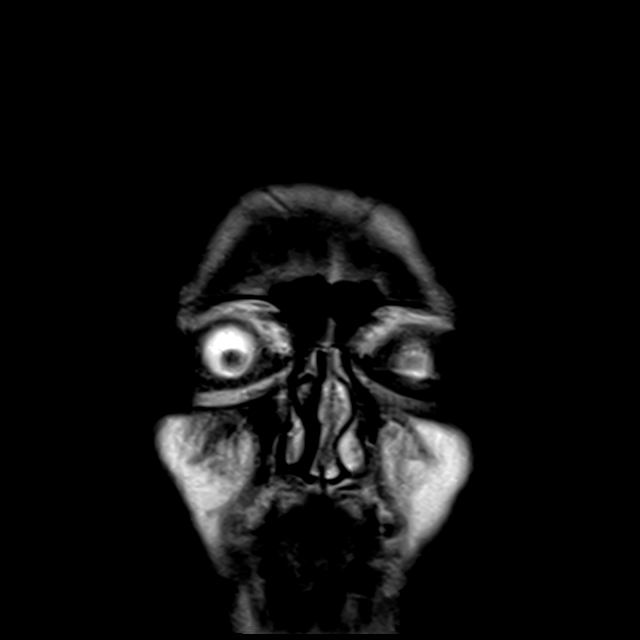

[44 of 48 positions shown; findings below may reference images not displayed]

FINDINGS: Brain: Acute/subacute infarct in the lateral right thalamus and
internal capsule is confirmed, measuring 13 mm. No other acute
infarct is present. T2 signal changes are associated with the
acute/subacute infarct. Susceptibility centrally suggests some
petechial hemorrhage within the infarct. Remote lacunar infarcts of
the right globus pallidus and caudate are stable. Dilated
perivascular spaces are present throughout the basal ganglia.

Periventricular white matter changes are stable. Ventricles are
proportionate to the degree of atrophy. No significant extraaxial
fluid collection is present.

The internal auditory canals are within normal limits. The brainstem
and cerebellum are within normal limits.

Remote blood products are present in the left occipital white
matter, new from the prior exam.

Vascular: Flow is present in the major intracranial arteries.

Skull and upper cervical spine: The craniocervical junction is
normal. Upper cervical spine is within normal limits. Marrow signal
is unremarkable.

Sinuses/Orbits: Left mastoid effusion is present. No obstructing
nasopharyngeal lesion is present. The paranasal sinuses and mastoid
air cells are otherwise clear. The globes and orbits are within
normal limits.
IMPRESSION: 1. Acute/subacute 13 mm infarct involving the lateral right thalamus
and internal capsule with small focus of central petechial
hemorrhage.
2. Remote lacunar infarcts of the right globus pallidus and caudate.
3. Atrophy and white matter disease is stable. This likely reflects
the sequela of chronic microvascular ischemia.
4. Left mastoid effusion. No obstructing nasopharyngeal lesion is
present.

These results were called by telephone at the time of interpretation
on 08/20/2020 at [DATE] to provider Dr. Ja, who verbally
acknowledged these results.

## 2021-07-30 DIAGNOSIS — U071 COVID-19: Secondary | ICD-10-CM | POA: Diagnosis not present

## 2021-08-03 DIAGNOSIS — I639 Cerebral infarction, unspecified: Secondary | ICD-10-CM | POA: Diagnosis not present

## 2021-08-03 DIAGNOSIS — I69354 Hemiplegia and hemiparesis following cerebral infarction affecting left non-dominant side: Secondary | ICD-10-CM | POA: Diagnosis not present

## 2021-08-03 DIAGNOSIS — U071 COVID-19: Secondary | ICD-10-CM | POA: Diagnosis not present

## 2021-08-03 DIAGNOSIS — I10 Essential (primary) hypertension: Secondary | ICD-10-CM | POA: Diagnosis not present

## 2021-08-06 DIAGNOSIS — U071 COVID-19: Secondary | ICD-10-CM | POA: Diagnosis not present

## 2021-08-10 DIAGNOSIS — E1140 Type 2 diabetes mellitus with diabetic neuropathy, unspecified: Secondary | ICD-10-CM | POA: Diagnosis not present

## 2021-08-10 DIAGNOSIS — I639 Cerebral infarction, unspecified: Secondary | ICD-10-CM | POA: Diagnosis not present

## 2021-08-20 DIAGNOSIS — F0390 Unspecified dementia without behavioral disturbance: Secondary | ICD-10-CM | POA: Diagnosis not present

## 2021-08-20 DIAGNOSIS — F32A Depression, unspecified: Secondary | ICD-10-CM | POA: Diagnosis not present

## 2021-08-20 DIAGNOSIS — G629 Polyneuropathy, unspecified: Secondary | ICD-10-CM | POA: Diagnosis not present

## 2021-08-20 DIAGNOSIS — I2510 Atherosclerotic heart disease of native coronary artery without angina pectoris: Secondary | ICD-10-CM | POA: Diagnosis not present

## 2021-08-20 DIAGNOSIS — E1140 Type 2 diabetes mellitus with diabetic neuropathy, unspecified: Secondary | ICD-10-CM | POA: Diagnosis not present

## 2021-08-20 DIAGNOSIS — D649 Anemia, unspecified: Secondary | ICD-10-CM | POA: Diagnosis not present

## 2021-08-20 DIAGNOSIS — K219 Gastro-esophageal reflux disease without esophagitis: Secondary | ICD-10-CM | POA: Diagnosis not present

## 2021-08-20 DIAGNOSIS — N184 Chronic kidney disease, stage 4 (severe): Secondary | ICD-10-CM | POA: Diagnosis not present

## 2021-08-20 DIAGNOSIS — I10 Essential (primary) hypertension: Secondary | ICD-10-CM | POA: Diagnosis not present

## 2021-08-20 DIAGNOSIS — I69354 Hemiplegia and hemiparesis following cerebral infarction affecting left non-dominant side: Secondary | ICD-10-CM | POA: Diagnosis not present

## 2021-08-20 DIAGNOSIS — I639 Cerebral infarction, unspecified: Secondary | ICD-10-CM | POA: Diagnosis not present

## 2021-08-20 DIAGNOSIS — S2242XA Multiple fractures of ribs, left side, initial encounter for closed fracture: Secondary | ICD-10-CM | POA: Diagnosis not present

## 2021-09-10 DIAGNOSIS — B029 Zoster without complications: Secondary | ICD-10-CM | POA: Diagnosis not present

## 2021-09-17 DIAGNOSIS — E1140 Type 2 diabetes mellitus with diabetic neuropathy, unspecified: Secondary | ICD-10-CM | POA: Diagnosis not present

## 2021-09-17 DIAGNOSIS — E039 Hypothyroidism, unspecified: Secondary | ICD-10-CM | POA: Diagnosis not present

## 2021-09-17 DIAGNOSIS — I639 Cerebral infarction, unspecified: Secondary | ICD-10-CM | POA: Diagnosis not present

## 2021-09-17 DIAGNOSIS — K219 Gastro-esophageal reflux disease without esophagitis: Secondary | ICD-10-CM | POA: Diagnosis not present

## 2021-09-17 DIAGNOSIS — N184 Chronic kidney disease, stage 4 (severe): Secondary | ICD-10-CM | POA: Diagnosis not present

## 2021-09-20 DIAGNOSIS — E1122 Type 2 diabetes mellitus with diabetic chronic kidney disease: Secondary | ICD-10-CM | POA: Diagnosis not present

## 2021-10-12 DIAGNOSIS — I69354 Hemiplegia and hemiparesis following cerebral infarction affecting left non-dominant side: Secondary | ICD-10-CM | POA: Diagnosis not present

## 2021-10-12 DIAGNOSIS — W19XXXA Unspecified fall, initial encounter: Secondary | ICD-10-CM | POA: Diagnosis not present

## 2021-10-12 DIAGNOSIS — I639 Cerebral infarction, unspecified: Secondary | ICD-10-CM | POA: Diagnosis not present

## 2021-11-19 DIAGNOSIS — D649 Anemia, unspecified: Secondary | ICD-10-CM | POA: Diagnosis not present

## 2021-11-19 DIAGNOSIS — I10 Essential (primary) hypertension: Secondary | ICD-10-CM | POA: Diagnosis not present

## 2021-11-19 DIAGNOSIS — G629 Polyneuropathy, unspecified: Secondary | ICD-10-CM | POA: Diagnosis not present

## 2021-11-19 DIAGNOSIS — E1140 Type 2 diabetes mellitus with diabetic neuropathy, unspecified: Secondary | ICD-10-CM | POA: Diagnosis not present

## 2021-11-19 DIAGNOSIS — I69354 Hemiplegia and hemiparesis following cerebral infarction affecting left non-dominant side: Secondary | ICD-10-CM | POA: Diagnosis not present

## 2021-11-19 DIAGNOSIS — I639 Cerebral infarction, unspecified: Secondary | ICD-10-CM | POA: Diagnosis not present

## 2021-11-19 DIAGNOSIS — E1122 Type 2 diabetes mellitus with diabetic chronic kidney disease: Secondary | ICD-10-CM | POA: Diagnosis not present

## 2021-11-19 DIAGNOSIS — I129 Hypertensive chronic kidney disease with stage 1 through stage 4 chronic kidney disease, or unspecified chronic kidney disease: Secondary | ICD-10-CM | POA: Diagnosis not present

## 2021-11-19 DIAGNOSIS — N184 Chronic kidney disease, stage 4 (severe): Secondary | ICD-10-CM | POA: Diagnosis not present

## 2021-11-19 DIAGNOSIS — F32A Depression, unspecified: Secondary | ICD-10-CM | POA: Diagnosis not present

## 2021-11-19 DIAGNOSIS — I2510 Atherosclerotic heart disease of native coronary artery without angina pectoris: Secondary | ICD-10-CM | POA: Diagnosis not present

## 2021-11-19 DIAGNOSIS — F0390 Unspecified dementia without behavioral disturbance: Secondary | ICD-10-CM | POA: Diagnosis not present

## 2022-08-09 DEATH — deceased
